# Patient Record
Sex: Female | Born: 1950 | Race: White | Hispanic: No
Health system: Southern US, Community
[De-identification: ages and names within clinical notes are randomized; demographics above are authoritative.]

## PROBLEM LIST (undated history)

## (undated) DIAGNOSIS — IMO0001 Reserved for inherently not codable concepts without codable children: Secondary | ICD-10-CM

## (undated) DIAGNOSIS — R51 Headache: Secondary | ICD-10-CM

## (undated) DIAGNOSIS — K219 Gastro-esophageal reflux disease without esophagitis: Secondary | ICD-10-CM

## (undated) DIAGNOSIS — E119 Type 2 diabetes mellitus without complications: Secondary | ICD-10-CM

## (undated) DIAGNOSIS — I739 Peripheral vascular disease, unspecified: Secondary | ICD-10-CM

## (undated) DIAGNOSIS — Z9889 Other specified postprocedural states: Secondary | ICD-10-CM

## (undated) DIAGNOSIS — G4730 Sleep apnea, unspecified: Secondary | ICD-10-CM

## (undated) DIAGNOSIS — N189 Chronic kidney disease, unspecified: Secondary | ICD-10-CM

## (undated) DIAGNOSIS — M1990 Unspecified osteoarthritis, unspecified site: Secondary | ICD-10-CM

## (undated) DIAGNOSIS — D649 Anemia, unspecified: Secondary | ICD-10-CM

## (undated) DIAGNOSIS — F419 Anxiety disorder, unspecified: Secondary | ICD-10-CM

## (undated) DIAGNOSIS — I872 Venous insufficiency (chronic) (peripheral): Secondary | ICD-10-CM

## (undated) DIAGNOSIS — H269 Unspecified cataract: Secondary | ICD-10-CM

## (undated) DIAGNOSIS — G2581 Restless legs syndrome: Secondary | ICD-10-CM

## (undated) DIAGNOSIS — R112 Nausea with vomiting, unspecified: Secondary | ICD-10-CM

## (undated) DIAGNOSIS — I2699 Other pulmonary embolism without acute cor pulmonale: Secondary | ICD-10-CM

## (undated) DIAGNOSIS — K760 Fatty (change of) liver, not elsewhere classified: Secondary | ICD-10-CM

## (undated) DIAGNOSIS — Z8249 Family history of ischemic heart disease and other diseases of the circulatory system: Secondary | ICD-10-CM

## (undated) DIAGNOSIS — R011 Cardiac murmur, unspecified: Secondary | ICD-10-CM

## (undated) DIAGNOSIS — R519 Headache, unspecified: Secondary | ICD-10-CM

## (undated) HISTORY — PX: ABDOMINAL HYSTERECTOMY: SHX81

## (undated) HISTORY — PX: COLONOSCOPY: SHX174

## (undated) HISTORY — DX: Family history of ischemic heart disease and other diseases of the circulatory system: Z82.49

## (undated) HISTORY — DX: Type 2 diabetes mellitus without complications: E11.9

## (undated) HISTORY — PX: APPENDECTOMY: SHX54

## (undated) HISTORY — PX: CHOLECYSTECTOMY: SHX55

## (undated) HISTORY — PX: PANNICULECTOMY: SUR1001

## (undated) HISTORY — DX: Venous insufficiency (chronic) (peripheral): I87.2

## (undated) HISTORY — DX: Morbid (severe) obesity due to excess calories: E66.01

## (undated) HISTORY — PX: FRACTURE SURGERY: SHX138

## (undated) HISTORY — PX: HERNIA REPAIR: SHX51

## (undated) SURGERY — ARTHROSCOPY, SHOULDER
Anesthesia: General | Laterality: Right

---

## 1999-10-13 ENCOUNTER — Ambulatory Visit (HOSPITAL_BASED_OUTPATIENT_CLINIC_OR_DEPARTMENT_OTHER): Admission: RE | Admit: 1999-10-13 | Discharge: 1999-10-13 | Payer: Self-pay | Admitting: Orthopaedic Surgery

## 1999-12-28 DIAGNOSIS — I2699 Other pulmonary embolism without acute cor pulmonale: Secondary | ICD-10-CM

## 1999-12-28 HISTORY — PX: JOINT REPLACEMENT: SHX530

## 1999-12-28 HISTORY — DX: Other pulmonary embolism without acute cor pulmonale: I26.99

## 2000-08-11 ENCOUNTER — Ambulatory Visit (HOSPITAL_BASED_OUTPATIENT_CLINIC_OR_DEPARTMENT_OTHER): Admission: RE | Admit: 2000-08-11 | Discharge: 2000-08-11 | Payer: Self-pay | Admitting: Orthopaedic Surgery

## 2000-12-19 ENCOUNTER — Emergency Department (HOSPITAL_COMMUNITY): Admission: EM | Admit: 2000-12-19 | Discharge: 2000-12-20 | Payer: Self-pay | Admitting: Emergency Medicine

## 2001-09-01 ENCOUNTER — Encounter: Payer: Self-pay | Admitting: Orthopaedic Surgery

## 2001-09-07 ENCOUNTER — Encounter (INDEPENDENT_AMBULATORY_CARE_PROVIDER_SITE_OTHER): Payer: Self-pay | Admitting: *Deleted

## 2001-09-07 ENCOUNTER — Inpatient Hospital Stay (HOSPITAL_COMMUNITY): Admission: RE | Admit: 2001-09-07 | Discharge: 2001-09-19 | Payer: Self-pay | Admitting: Orthopaedic Surgery

## 2001-09-09 ENCOUNTER — Encounter: Payer: Self-pay | Admitting: Orthopedic Surgery

## 2001-09-10 ENCOUNTER — Encounter: Payer: Self-pay | Admitting: Orthopaedic Surgery

## 2001-09-11 ENCOUNTER — Encounter: Payer: Self-pay | Admitting: Orthopaedic Surgery

## 2001-09-12 ENCOUNTER — Encounter: Payer: Self-pay | Admitting: Orthopaedic Surgery

## 2001-09-14 ENCOUNTER — Encounter: Payer: Self-pay | Admitting: Orthopaedic Surgery

## 2001-09-19 ENCOUNTER — Inpatient Hospital Stay
Admission: RE | Admit: 2001-09-19 | Discharge: 2001-09-28 | Payer: Self-pay | Admitting: Physical Medicine & Rehabilitation

## 2001-09-25 ENCOUNTER — Encounter: Payer: Self-pay | Admitting: Physical Medicine & Rehabilitation

## 2006-01-26 ENCOUNTER — Encounter: Admission: RE | Admit: 2006-01-26 | Discharge: 2006-01-26 | Payer: Self-pay | Admitting: Orthopaedic Surgery

## 2007-11-14 ENCOUNTER — Ambulatory Visit (HOSPITAL_COMMUNITY): Admission: RE | Admit: 2007-11-14 | Discharge: 2007-11-14 | Payer: Self-pay | Admitting: Orthopaedic Surgery

## 2011-05-11 NOTE — Op Note (Signed)
NAME:  [name], [name]            ACCOUNT NO.:  [id number]   MEDICAL RECORD NO.:  [id number]          PATIENT TYPE:  AMB   LOCATION:  SDS                          FACILITY:  [facility]   PHYSICIAN:  Lubertha Basque. Dalldorf, M.D.DATE OF BIRTH:  1951-03-30   DATE OF PROCEDURE:  11/14/2007  DATE OF DISCHARGE:                               OPERATIVE REPORT   PREOPERATIVE DIAGNOSIS:  Right wrist de Quervain's tenosynovitis.   POSTOPERATIVE DIAGNOSIS:  Right wrist de Quervain's tenosynovitis.   PROCEDURE:  Right first dorsal compartment release.   ANESTHESIA:  Bier block and MAC.   ATTENDING SURGEON:  Lubertha Basque. Jerl Santos, M.D.   ASSISTANT:  Lindwood Qua, P.A.-C.   INDICATIONS FOR PROCEDURE:  The patient is a 60 year old woman a long  history right wrist pain.  This has persisted despite bracing, oral anti-  inflammatories, and several injections which did afford her transient  relief.  She is status post successful release on the opposite side for  an identical condition which is de Quervain's tenosynovitis.  She is  offered operative release at this point.  Informed operative consent was  obtained after a discussion of the possible occasions of reaction to  anesthesia, infection, and neurovascular injury.   SUMMARY OF FINDINGS AND PROCEDURE:  Under Bier block and MAC, through a  small radial incision, a first dorsal compartment release was performed.  She had very inflamed tendons and a tight compartment.   DESCRIPTION OF PROCEDURE:  The patient was taken to the operating suite  where Bier block was applied to the level of the forearm.  She was also  some sedation, positioned supine, and prepped and draped in a normal  sterile fashion.  After the administration of IV Kefzol, a small radial  incision was made with dissection down to the first dorsal compartment.  This compartment was released sharply.  She had a single slip of both  the ETP and APL tendons.  The tendons were very inflamed  and swollen.  The wound was irrigated followed by reapproximation of subcutaneous  tissues with 2-0 undyed Vicryl and the skin with nylon.  We did retract  the nerve out of harm's way during the entire dissection.  Adaptic was  applied followed by dry gauze and a loose Ace wrap.  Estimated blood  loss and intraoperative fluids can be obtained from anesthesia records  as can the accurate tourniquet time.  The tourniquet was deflated after  closure and the fingers all became pink and warm immediately.  We then  prepped an area on her left knee and injected this degenerative knee  with epinephrine and lidocaine.   DISPOSITION:  The patient was taken to the recovery room in stable  addition.  She was to go home same day and follow up in the office next  week.  I will contact her by phone tonight.      Lubertha Basque Jerl Santos, M.D.  Electronically Signed     PGD/MEDQ  D:  11/14/2007  T:  11/14/2007  Job:  [id number]

## 2011-05-14 NOTE — Consult Note (Signed)
[place]. [facility]  Patient:    Lindi, Abram [facility] Visit Number: [id number] MRN: [id number]          Service Type: SUR Location: 5000 5030 01 Attending Physician:  Marcene Corning Dictated by:   Barbaraann Share, M.D. [facility] Proc. Date: 09/09/01 Admit Date:  09/07/2001   CC:         Lubertha Basque. Jerl Santos, M.D.   Consultation Report  HISTORY OF PRESENT ILLNESS:  The patient is a 60 year old morbidly obese white female who I have been asked to see for an acute pulmonary embolus.  The patient is status post right total knee replacement September 07, 2001 and yesterday began to have hemoptysis.  This morning, she was noted to have falling O2 saturations and worsening dyspnea and underwent a spiral CT scan of the chest with multiple right lung pulmonary emboli being noted.  There was also a suggestion of questionable edema in the perihilar area versus questionable pneumonia.  The patient currently is comfortable in the chair on nasal O2.  Her saturations will vary anywhere from the low 80s to the mid 90s based on her depth of inspiration.  She is coughing up blood but denies a feeling of congestion, like she has a chest cold.  There is no significant increased work of breathing or pleuritic chest pain.  PAST MEDICAL HISTORY: 1. Diabetes mellitus, for which she is on Glyburide. 2. Status post cholecystectomy and appendectomy. 3. Status post hysterectomy. 4. 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 5. History of chronic lower extremity edema.  ALLERGIES:  PENICILLIN.  SOCIAL HISTORY:  She does not smoke.  The rest of her social history is noncontributory.  FAMILY HISTORY:  Noncontributory.  REVIEW OF SYSTEMS:  As per history of present illness.  PHYSICAL EXAMINATION:  GENERAL:  She is a morbidly obese white female in no acute distress.  VITAL SIGNS:  Blood pressure is 100/36??.  Pulse is 95-110, respiratory rate 20.  She is  afebrile.  HEENT:  Pupils are equal, round and reactive to light to accommodation. Extraocular movements are intact.  Nares are patent without discharge. Oropharynx is clear.  NECK:  Supple without JVD or lymphadenopathy.  It is very hard to examine, however, because of its large size.  There is no palpable thyromegaly.  CHEST:  Decreased breath sounds in the bases with a few crackles.  CARDIAC:  Regular rate and rhythm.  ABDOMEN:  Soft and nontender with good bowel sounds.  GENITAL/RECTAL/BREAST:  Exams not done, not indicated.  EXTREMITIES:  Lower extremities reveal 2+ edema on the right and 1+ on the left.  Pulses are intact distally.  Calves are nontender on the left.  The right is unable to be examined secondary to a splint.  NEUROLOGIC:  She is alert and oriented x 3.  No obvious motor deficits.  LABORATORY DATA:  CT scan as per history of present illness.  IMPRESSION: 1. Acute pulmonary embolus after total knee replacement.  Her major risk    factors are morbid obesity and immobility.  It would not surprise me if she    had DVT even prior to the surgery. 2. 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  I suspect this is    responsible for her fluctuating O2 saturations. 3. Edema versus questionable pneumonic infiltrate.  We will just follow for    now.  PLAN: 1. We will give heparin and Coumadin in the usual overlapping fashion. 2. Check lower extremity Dopplers.  3. Out of bed b.i.d. and incentive spirometry. 4. Diurese versus a trial of antibiotics.  We will let temperature be the    guide.  For now, we will go ahead and give her a few doses of Lasix and see    if this helps things. Dictated by:   Barbaraann Share, M.D. [facility] Attending Physician:  Marcene Corning DD:  09/09/01 TD:  09/09/01 Job: [id number]

## 2011-05-14 NOTE — Discharge Summary (Signed)
[place]. [facility]  Patient:    [name], [name] [facility] Visit Number: [id number] MRN: [id number]          Service Type: ECR Location: SACU 4531 01 Attending Physician:  Faith Rogue T Dictated by:   Lucita Lora, M.D. Admit Date:  09/19/2001 Discharge Date: 09/28/2001   CC:         Dr. Record  Barbaraann Share, M.D. [facility]   Discharge Summary  DISCHARGE DIAGNOSES: 1. Status post right total knee replacement secondary to end-stage    degenerative joint disease. 2. Diabetes mellitus. 3. Gastroesophageal reflux disease. 4. Morbid obesity. 5. Anemia. 6. Depression. 7. Hypercholesterolemia. 8. Chronic lower extremity edema. 9. Pulmonary emboli.  HISTORY OF PRESENT ILLNESS:  The patient is a 60 year old white female with past medical history of diabetes, GERD, and depression, admitted on September 07, 2001, for right total knee replacement by Lubertha Basque. Dalldorf, M.D. secondary to end-stage DJD disease.  The patient is presently on Coumadin for DVT prophylaxis and for PE.  Postoperative complications include shortness of breath, cough, respiratory distress, pulmonary emboli, pneumonia, and cellulitis.  The patient is presently on Keflex.  PT report; the patient is ambulating minimal assist 30 feet +2 for safety. Transfers sit to stand minimal assist.  Her primary care physician is Dr. Record.  Pulmonologist is Barbaraann Share, M.D. [facility] while in the hospital.  PAST MEDICAL HISTORY:  Significant for DM, GERD, depression, hepatitis, insomnia, bronchitis, morbid obesity, diverticulitis, lower extremity edema.  PAST SURGICAL HISTORY:  Significant for hysterectomy, gallbladder removal.  ALLERGIES:  PENICILLIN.  SOCIAL HISTORY:  The patient lives alone in a mobile home.  She has four steps to entry.  She is independent prior to admission and was working but presently is unemployed.  No tobacco and alcohol use.  She has one daughter.  REVIEW OF SYSTEMS:   Significant for knee pain, obesity, and hernia.  MEDICATIONS: 1. Glyburide 5 mg p.o. b.i.d. 2. Acifex 200 mg q.d. 3. Celexa 20 mg q.d. 4. Lasix 40 mg q.d. 5. Lipitor 20 mg q.d. 6. Potassium chloride 20 mEq q.d. 7. Xanax.  HOSPITAL COURSE:  [name] was admitted to [facility] where she received physical therapy, occupational therapy as tolerated.  [name] progressed fairly well while in [facility].  Her hospital course is significant for severe anemia, and hypertension, and urinary tract infection.  On September 26, 2001, the patient had received two units of packed red blood cells due to a low hemoglobin.  The patient tolerated the transfusion very well.  She was continued on Trinsicon p.o. b.i.d.  Hemoglobin did elevate to 10.1.  The patient was started on Cipro 250 mg p.o. b.i.d. for urinary tract infection for a total of seven days.  She remained on Coumadin throughout her stay in [facility] for pulmonary embolus.  She will be on Coumadin for approximately six months.  At the time of admission the patient was started on Keflex due to possible cellulitis.  Keflex was discontinued on postoperative day #15.  On September 22, 2001, the patient had demonstrated that her blood pressure had dropped to 96/46, but was possibly due to wrong size cuff.  Blood pressure remained stable mostly throughout her stay in [facility].  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  Her CBGs remained in fair control. She was on Glyburide 5 mg p.o. q.d. Besides occasional insomnia, the patient had no other major medical complications while in [facility] for therapies.  Latest labs indicated that her hemoglobin  was 10.2, hematocrit 31.3, white blood cell count 7.5, platelets 340.  Her sodium was 139, potassium 4.0, glucose 104, BUN 7, creatinine 0.7, AST 21, ALT 13.  Her INR prior to discharge was 2.7.  At the time of discharge, blood pressure was 150/63, temperature was 97.4, pulse 74, respiratory rate 20.  CBGs were  running from 79 and 97 to 113 and 110.  Surgical incisions demonstrate no signs of infection.  Sutures were removed.  Right lower extremity demonstrated about 2+ edema.  The rest of the physical examination was unremarkable.  She was discharged home with her family.  Her latest PT report indicates that the patient was ambulating modified independently 150 feet with rolling walker, transfer sit to stand modified independently with rolling walker.  She could perform all ADLs modified independently.  DISCHARGE MEDICATIONS:  1. Glyburide 5 mg twice daily.  2. Lipitor 20 mg daily.  3. Celexa 20 mg daily.  4. Lasix 40 mg twice daily.  5. Potassium 20 mEq twice daily.  6. Trinsicon one tablet twice daily.  7. Oxycontin 10 mg one tablet every 12 hours.  8. Oxycodone 5 mg one to two tablets every four to six hours as needed for     pain.  9. Coumadin 2 mg daily for six months. 10. Acifex 20 mg daily.  ACTIVITY:  The patient is to use walker and observe knee precautions.  DIET:  Low cholesterol, low fat, low sodium, no concentrated sweets.  She will have [facility] for nursing, PT, and OT.  They will monitor her Coumadin, the first draw will be October 02, 2001, and thereafter Dr. Record will follow.  She is to follow up with Lubertha Basque. Dalldorf, M.D. in two weeks.  Follow up with Dr. Shelle Iron, pulmonologist within two to four weeks. Follow up with Dr. Record, her primary care Adithi Gammon in one to two weeks. Follow up with Faith Rogue, M.D. as needed. Dictated by:   Lucita Lora, M.D. Attending Physician:  Faith Rogue T DD:  09/28/01 TD:  09/29/01 Job: [id number]

## 2011-05-14 NOTE — Discharge Summary (Signed)
[place]. [facility]  Patient:    Brandi, Bridges [facility] Visit Number: [id number] MRN: [id number]          Service Type: ECR Location: SACU 4531 01 Attending Physician:  Faith Rogue T Dictated by:   Prince Rome, P.A. Admit Date:  09/19/2001 Discharge Date: 09/28/2001                             Discharge Summary  ADMITTING DIAGNOSES: 1. End-stage right knee degenerative joint disease. 2. Morbid obesity. 3. Non-insulin dependent diabetes mellitus. 4. Status post hysterectomy. 5. Status post cholecystectomy.  DISCHARGE DIAGNOSES: 1. End-stage right knee degenerative joint disease. 2. Morbid obesity. 3. Non-insulin dependent diabetes mellitus. 4. Status post hysterectomy. 5. Status post cholecystectomy. 6. Pulmonary embolism.  BRIEF HISTORY:  This is a 60 year old white female patient well known to our practice, who we have been treating for right knee pain for some time.  She has had two previous knee arthroscopies and has had significant degenerative joint disease, particularly of her medial compartment.  I discussed treatment options with the patient and the concerns with her weight being a factor doing a total knee replacement, which is what was decided upon.  PERTINENT LABORATORY AND X-RAY FINDINGS:  Inclusive in this chart.  She had an echocardiogram done which showed left ventricular systolic function normal and venous Dopplers with no evidence of DVT.  EKG revealed normal sinus rhythm with sinus arrhythmias.  Lungs revealed no active disease on chest x-ray.  She had multiple chest x-rays done and a CT scan as well which showed a pulmonary emboli involving the right distal main pulmonary artery.  She has pro times drawn serially as she was on low-dose Coumadin protocol for anticoagulation purposes.  Last testing, hemoglobin was 8.6.  COURSE IN HOSPITAL:  The patient was admitted postoperatively with p.o. and IV analgesics for  pain.  She was also anticoagulated and dressings were changed numerous times.  Drains pulled.  Physical therapy was ordered for weight-bearing as tolerated.  She had some difficulties while in the hospital because of decreased O2 saturations and she had a suspected pulmonary embolus.  She was taken to the ICU and followed there by Dr. Shelle Iron.  Dr. Shelle Iron felt that she needed to be anticoagulated on a long-term basis.  He followed her throughout her hospital stay and she improved and was eventually discharged.  CONDITION ON DISCHARGE:  Improved.  FOLLOWUP:  I will see her back in our office in two weeks after discharge from the hospital.  Staples will be removed in two weeks postoperatively.  DISCHARGE MEDICATIONS:  She will remain on Coumadin and pain medications. Dictated by:   Prince Rome, P.A. Attending Physician:  Faith Rogue T DD:  10/28/01 TD:  10/30/01 Job: [id number]

## 2011-05-14 NOTE — Op Note (Signed)
[place]. [facility]  Patient:    [name], [place] [facility]                       MRN: [id number] Proc. Date: 08/11/00 Adm. Date:  [id number] Attending:  Marcene Corning                           Operative Report  PREOPERATIVE DIAGNOSES: 1. Right knee torn medial meniscus. 2. Right knee degenerative joint disease.  POSTOPERATIVE DIAGNOSES: 1. Right knee torn medial and torn lateral meniscus. 2. Right knee degenerative joint disease.  PROCEDURES: 1. Right knee partial medial and partial lateral meniscectomy. 2. Right knee chondroplasty patellofemoral joint.  ANESTHESIA:  General.  SURGEON:  Lubertha Basque. Jerl Santos, M.D.  ASSISTANT:  Prince Rome, P.A.  INDICATIONS:  The patient is a 60 year old woman with a recent history of severe right knee pain.  She is status post an arthroscopy several years ago in [place].  Her pain has persisted at this point despite anti-inflammatories and an injection, which afforded her only transient relief.  At this point, she is offered operative intervention to consist of an arthroscopy.  The procedure was discussed with the patient and informed operative consent was obtained after discussion of possible complications of reaction to anesthesia and infection.  DESCRIPTION OF PROCEDURE:  The patient was taken to the operating suite, where a general anesthetic was applied without difficulty.  She was positioned supine and prepped and draped in normal sterile fashion.  After the administration of preoperative IV antibiotics, an arthroscopy of the right knee was performed through two inferior portals.  Suprapatellar pouch was benign, while the patellofemoral joint exhibited some grade 3 chondromalacia, which was addressed with a chondroplasty.  This joint tracked very well and no lateral release was required.  The medial gutter was notable for a flap of articular cartilage, which was removed.  The medial femoral condyle had a  dime size defect on the posterior aspect, which was addressed with a chondroplasty. There was a small area of grade 4 change at the base of the section.  She had a degenerative tear of the posterior horn of her medial meniscus, which was addressed with removal of about 5% of the structure back to a stable rim.  In the lateral compartment, she had some anterior and middle horn tearing of the meniscus, which was addressed with a partial lateral meniscectomy taking a very small portion of the structure back to a stable rim.  There were no significant degenerative changes in that compartment.  The ACL and PCL were intake.  The knee was thoroughly irrigated at the end of the case, followed by the placement of Marcaine with epinephrine and morphine plus Depo Medrol. Adaptic was placed over the two portals, followed by dry gauze and a loose Ace wrap.  Estimated blood loss and intraoperative fluids can be obtained from anesthesia records.  DISPOSITION:  The patient was extubated in the operating room and taken to recovery room in stable condition.  PLANS:  For her to go home the same day and to follow up in the office in less than a week.  I will contact her by phone tonight. DD:  08/11/00 TD:  08/11/00 Job: [id number]

## 2011-05-14 NOTE — Op Note (Signed)
[place]. [facility]  Patient:    [name], [name] Visit Number: [id number] MRN: [id number]          Service Type: SUR Location: RCRM 2550 05 Attending Physician:  Marcene Corning Dictated by:   Lubertha Basque. Jerl Santos, M.D. Proc. Date: 09/07/01 Admit Date:  09/07/2001                             Operative Report  PREOPERATIVE DIAGNOSIS:  Right knee degenerative arthritis.  POSTOPERATIVE DIAGNOSIS:  Right knee degenerative arthritis.  OPERATION:  Right total knee replacement.  SURGEON:  Lubertha Basque. Jerl Santos, M.D.  ASSISTANT:  Jearld Adjutant, M.D./Michael R. Harolyn Rutherford, P.A.  ANESTHESIA:  General  INDICATION FOR PROCEDURE:  The patient is a 60 year old woman with long history of right knee pain.  This persisted despite various oral anti-inflammatories, injections, and a knee arthroscopy at which point end-stage degeneration was seen in the medial compartment mostly.  X-rays are consistent with this diagnosis and major degeneration of the medial compartment.  She is morbidly obese and we tried to avoid a knee replacement operation but she is left with disabling pain at rest and with activity and at this point have offered the knee replacement operation.  The procedure was discussed with the patient and informed operative consent was obtained after discussion of the possible complications of reaction to anesthesia, infection, DVT, PE, and death.  DESCRIPTION OF PROCEDURE:  The patient was taken to the operating suite, where a general anesthetic was applied without difficulty.  She was positioned supine and prepped and draped in the normal sterile fashion.  After the administration of preoperative IV antibiotics, the right leg was elevated, exsanguinated, and a tourniquet inflated about the thigh.  An anterior longitudinal incision was made with dissection down through a abundance of adipose tissue to expose the extensor mechanism.  A medial  parapatellar incision was made through this structure.  We could not flip the knee cap because of her large leg size but slid it over to the side and flexed her knee up.  Her baseline range of motion was to 80 or 90 degrees of flexion which made the case extremely difficult.  An extra medullary guide was placed on the tibia and a cut made parallel with the floor.  An intramedullary femoral guide was placed to make an anterior and posterior cut and a flexion gap of 10 mm. She was found to have a small defect on the anterolateral aspect of her distal femur.  Some of this tissue was sent to pathology and it appeared to be hypercellular but there was no concern for neoplasia.  The second intramedullary guide was then placed in the femur to create a distal cut, making an equal extension gap of 10 mm.  The femur sized to a standard component and the appropriate chamfer guide was placed and used.  Patella also required trimming down but because of the large size of her thigh, we could not place the typical cutting device and this had to be done free hand.  We took her patella thickness from 22 down to 13 mm.  The appropriate standard guide was placed to make the three drill holes in the patella.  The tibia sized to a #3 component and the appropriate guide was placed and used creating a keel.  A trial reduction was performed with some difficultly.  The patella tracked in a lateral position and a lateral release was  required.  Otherwise the knee came to full extension easily and flexed to 80 or 90 degrees.  The trial components were removed and pulsatile lavage was used to irrigate all cut bony surfaces.  Cement was mixed including the antibiotic and this was pressurized on the tibia and femur.  The aforementioned DePuy components were placed which were standard plus femur and #3 tibia LCS system.  The 10 mm spacer was applied.  Excess cement was trimmed and pressure was held on the components until  the cement had hardened completely.  Some excess cement was placed into the defect on the anterolateral femur as well. A separate batch of cement was mixed for the patella and this did not include an antibiotic.  The standard patella was then cemented into place with excess cement trimmed.  The pressure held on the component until cement had hardened.  The tourniquet was deflated and a small bleeding was easily controlled with Bovie cautery. Pulsatile lavage was used to cleanse the knee followed by placement of a drain exiting superolaterally.  Then #1 Vicryl was used to reapproximate the extensor mechanism.  Once this was accomplished, the knee flexed to about 80 degrees passively.  Subcutaneous tissue was reapproximated in several layers of Vicryl followed by staples in the skin.  Adaptic was applied to the wound, followed by dry gauze and a loose Ace wrap.  Estimated blood and intraoperative fluids as well as accurate tourniquet time can be obtained from anesthesia records.  DISPOSITION:  The patient was extubated in the operating room and taken to the recovery room in stable condition.  An attempt at an epidural was made for postoperative pain control.  She will be admitted to the orthopedic surgery service.  Appropriate postoperative care to include perioperative antibiotics and Coumadin for DVT prophylaxis. Dictated by:   Lubertha Basque Jerl Santos, M.D. Attending Physician:  Marcene Corning DD:  09/07/01 TD:  09/07/01 Job: [id number]

## 2011-10-05 LAB — CBC
HCT: 34.7 — ABNORMAL LOW
Hemoglobin: 11.4 — ABNORMAL LOW
MCHC: 33
MCV: 82.8
Platelets: 184
RBC: 4.19
RDW: 17 — ABNORMAL HIGH
WBC: 9.3

## 2011-10-05 LAB — COMPREHENSIVE METABOLIC PANEL
ALT: 21
AST: 38 — ABNORMAL HIGH
Albumin: 3.2 — ABNORMAL LOW
Alkaline Phosphatase: 105
BUN: 10
CO2: 25
Calcium: 8.9
Chloride: 103
Creatinine, Ser: 0.86
GFR calc Af Amer: >60
GFR calc non Af Amer: >60
Glucose, Bld: 124 — ABNORMAL HIGH
Potassium: 4.1
Sodium: 137
Total Bilirubin: 0.9
Total Protein: 8.1

## 2012-12-27 HISTORY — PX: CARDIAC CATHETERIZATION: SHX172

## 2014-03-20 ENCOUNTER — Encounter: Payer: Self-pay | Admitting: Cardiovascular Disease

## 2014-03-20 ENCOUNTER — Ambulatory Visit (HOSPITAL_COMMUNITY)
Admission: RE | Admit: 2014-03-20 | Discharge: 2014-03-20 | Disposition: A | Discharge status: Home / Self Care | Payer: Medicare Other | Source: Ambulatory Visit | Attending: Cardiovascular Disease | Admitting: Cardiovascular Disease

## 2014-03-20 ENCOUNTER — Ambulatory Visit (INDEPENDENT_AMBULATORY_CARE_PROVIDER_SITE_OTHER): Payer: Medicare Other | Admitting: Cardiovascular Disease

## 2014-03-20 VITALS — BP 121/61 | HR 83 | Ht 60.0 in | Wt 322.0 lb

## 2014-03-20 DIAGNOSIS — I739 Peripheral vascular disease, unspecified: Secondary | ICD-10-CM | POA: Insufficient documentation

## 2014-03-20 DIAGNOSIS — Z01818 Encounter for other preprocedural examination: Secondary | ICD-10-CM | POA: Insufficient documentation

## 2014-03-20 DIAGNOSIS — E1165 Type 2 diabetes mellitus with hyperglycemia: Secondary | ICD-10-CM | POA: Insufficient documentation

## 2014-03-20 DIAGNOSIS — E119 Type 2 diabetes mellitus without complications: Secondary | ICD-10-CM

## 2014-03-20 DIAGNOSIS — M21619 Bunion of unspecified foot: Secondary | ICD-10-CM

## 2014-03-20 DIAGNOSIS — Z8249 Family history of ischemic heart disease and other diseases of the circulatory system: Secondary | ICD-10-CM

## 2014-03-20 DIAGNOSIS — Z86711 Personal history of pulmonary embolism: Secondary | ICD-10-CM

## 2014-03-20 DIAGNOSIS — M21611 Bunion of right foot: Secondary | ICD-10-CM | POA: Insufficient documentation

## 2014-03-20 NOTE — Patient Instructions (Addendum)
Follow up with Dr Berry as needed.  

## 2014-03-20 NOTE — Progress Notes (Signed)
Right Lower Extremity Arterial Duplex Completed. No evidence for arterial insufficiency. [place]

## 2014-03-20 NOTE — Progress Notes (Signed)
03/20/2014 [name]   [birth year]  [id number]  Primary Physician RECORD,CHARLES Primary Cardiologist: Lorretta Harp MD Renae Gloss   HPI:  [name] is a 63 year old morbidly overweight widowed Caucasian female mother of one child, grandmother and 2 grandchildren referred by Dr. Harlow Mares, podiatrist, at [facility] for preoperative clearance and vascular evaluation prior to surgical excision of a right foot bunion. Her vascular risk factors include diabetes and family history. She has never had a heart attack or stroke. She did have a cardiac catheter did have position performed by Dr. Sharolyn Douglas 11/27/13 at [facility] performed radially which apparently showed no evidence of CAD. This was done because of chest pain which ultimately was determined to be gastrointestinal in nature. She's had a history of a remote pulmonary embolus which led to her disability. She denies claudication but does complain of right foot pain.   Current Outpatient Prescriptions  Medication Sig Dispense Refill  . ALPRAZolam (XANAX) 1 MG tablet Take 1 mg by mouth at bedtime as needed for anxiety.      [name] Kitchen aspirin EC 81 MG tablet Take 81 mg by mouth daily.      . carbidopa-levodopa (SINEMET IR) 10-100 MG per tablet Take 1 tablet by mouth 3 (three) times daily.      . citalopram (CELEXA) 40 MG tablet Take 40 mg by mouth daily.      . fluticasone (FLONASE) 50 MCG/ACT nasal spray Place into both nostrils daily.      . furosemide (LASIX) 40 MG tablet Take 40 mg by mouth 2 (two) times daily.      [name] Kitchen glipiZIDE (GLUCOTROL) 5 MG tablet Take 5 mg by mouth daily before breakfast.      . levofloxacin (LEVAQUIN) 250 MG tablet Take 250 mg by mouth daily.      . metFORMIN (GLUCOPHAGE) 1000 MG tablet Take 1,000 mg by mouth 2 (two) times daily with a meal.      . montelukast (SINGULAIR) 10 MG tablet Take 10 mg by mouth at bedtime.      [name] Kitchen omeprazole (PRILOSEC) 40 MG capsule Take 40 mg  by mouth daily.      . potassium chloride SA (K-DUR,KLOR-CON) 20 MEQ tablet Take 20 mEq by mouth 2 (two) times daily.      . pravastatin (PRAVACHOL) 80 MG tablet Take 80 mg by mouth daily.      [name] Kitchen spironolactone (ALDACTONE) 25 MG tablet Take 25 mg by mouth daily.      [name] Kitchen tiotropium (SPIRIVA) 18 MCG inhalation capsule Place 18 mcg into inhaler and inhale daily.      . traMADol (ULTRAM) 50 MG tablet Take by mouth every 6 (six) hours as needed.       No current facility-administered medications for this visit.    Allergies  Allergen Reactions  . Penicillins Itching and Rash    History   Social History  . Marital Status: Widowed    Spouse Name: N/A    Number of Children: N/A  . Years of Education: N/A   Occupational History  . Not on file.   Social History Main Topics  . Smoking status: Never Smoker   . Smokeless tobacco: Never Used  . Alcohol Use: No  . Drug Use: No  . Sexual Activity: Not on file   Other Topics Concern  . Not on file   Social History Narrative  . No narrative on file     Review of Systems: General:  negative for chills, fever, night sweats or weight changes.  Cardiovascular: negative for chest pain, dyspnea on exertion, edema, orthopnea, palpitations, paroxysmal nocturnal dyspnea or shortness of breath Dermatological: negative for rash Respiratory: negative for cough or wheezing Urologic: negative for hematuria Abdominal: negative for nausea, vomiting, diarrhea, bright red blood per rectum, melena, or hematemesis Neurologic: negative for visual changes, syncope, or dizziness All other systems reviewed and are otherwise negative except as noted above.    Blood pressure 121/61, pulse 83, height 5' (1.524 m), weight 322 lb (146.058 kg).  General appearance: alert and no distress Neck: no adenopathy, no carotid bruit, no JVD, supple, symmetrical, trachea midline and thyroid not enlarged, symmetric, no tenderness/mass/nodules Lungs: clear to auscultation  bilaterally Heart: regular rate and rhythm, S1, S2 normal, no murmur, click, rub or gallop Extremities: extremities normal, atraumatic, no cyanosis or edema  EKG not performed today  ASSESSMENT AND PLAN:   Bunion, right foot The patient was referred to me by Dr. Barkley Bruns from [facility] for vascular evaluation prior to surgical excision of a bunion. She has a history of diabetes. On exam she has palpable pedal pulses. I am going to get lower extremity arterial Doppler studies.      Lorretta Harp MD FACP,FACC,FAHA, [facility] 03/20/2014 3:00 PM

## 2014-03-20 NOTE — Assessment & Plan Note (Signed)
The patient was referred to me by Dr. Barkley Bruns from [facility] for vascular evaluation prior to surgical excision of a bunion. She has a history of diabetes. On exam she has palpable pedal pulses. I am going to get lower extremity arterial Doppler studies.

## 2014-12-30 DIAGNOSIS — M2011 Hallux valgus (acquired), right foot: Secondary | ICD-10-CM | POA: Diagnosis not present

## 2014-12-30 DIAGNOSIS — Z96659 Presence of unspecified artificial knee joint: Secondary | ICD-10-CM | POA: Diagnosis not present

## 2015-01-01 DIAGNOSIS — J449 Chronic obstructive pulmonary disease, unspecified: Secondary | ICD-10-CM | POA: Diagnosis not present

## 2015-01-01 DIAGNOSIS — E119 Type 2 diabetes mellitus without complications: Secondary | ICD-10-CM | POA: Diagnosis not present

## 2015-01-01 DIAGNOSIS — K760 Fatty (change of) liver, not elsewhere classified: Secondary | ICD-10-CM | POA: Diagnosis not present

## 2015-01-01 DIAGNOSIS — N183 Chronic kidney disease, stage 3 (moderate): Secondary | ICD-10-CM | POA: Diagnosis not present

## 2015-01-01 DIAGNOSIS — I129 Hypertensive chronic kidney disease with stage 1 through stage 4 chronic kidney disease, or unspecified chronic kidney disease: Secondary | ICD-10-CM | POA: Diagnosis not present

## 2015-01-01 DIAGNOSIS — G4730 Sleep apnea, unspecified: Secondary | ICD-10-CM | POA: Diagnosis not present

## 2015-01-01 DIAGNOSIS — Z800 Family history of malignant neoplasm of digestive organs: Secondary | ICD-10-CM | POA: Diagnosis not present

## 2015-01-01 DIAGNOSIS — F329 Major depressive disorder, single episode, unspecified: Secondary | ICD-10-CM | POA: Diagnosis not present

## 2015-01-01 DIAGNOSIS — Z880 Allergy status to penicillin: Secondary | ICD-10-CM | POA: Diagnosis not present

## 2015-01-01 DIAGNOSIS — Z79899 Other long term (current) drug therapy: Secondary | ICD-10-CM | POA: Diagnosis not present

## 2015-01-01 DIAGNOSIS — K5730 Diverticulosis of large intestine without perforation or abscess without bleeding: Secondary | ICD-10-CM | POA: Diagnosis not present

## 2015-01-01 DIAGNOSIS — J45909 Unspecified asthma, uncomplicated: Secondary | ICD-10-CM | POA: Diagnosis not present

## 2015-01-01 DIAGNOSIS — F419 Anxiety disorder, unspecified: Secondary | ICD-10-CM | POA: Diagnosis not present

## 2015-01-01 DIAGNOSIS — K219 Gastro-esophageal reflux disease without esophagitis: Secondary | ICD-10-CM | POA: Diagnosis not present

## 2015-01-01 DIAGNOSIS — Z6841 Body Mass Index (BMI) 40.0 and over, adult: Secondary | ICD-10-CM | POA: Diagnosis not present

## 2015-01-01 DIAGNOSIS — M1990 Unspecified osteoarthritis, unspecified site: Secondary | ICD-10-CM | POA: Diagnosis not present

## 2015-01-01 DIAGNOSIS — Z8371 Family history of colonic polyps: Secondary | ICD-10-CM | POA: Diagnosis not present

## 2015-01-01 DIAGNOSIS — Z888 Allergy status to other drugs, medicaments and biological substances status: Secondary | ICD-10-CM | POA: Diagnosis not present

## 2015-01-01 DIAGNOSIS — Z7951 Long term (current) use of inhaled steroids: Secondary | ICD-10-CM | POA: Diagnosis not present

## 2015-01-01 DIAGNOSIS — E785 Hyperlipidemia, unspecified: Secondary | ICD-10-CM | POA: Diagnosis not present

## 2015-01-06 DIAGNOSIS — Z1231 Encounter for screening mammogram for malignant neoplasm of breast: Secondary | ICD-10-CM | POA: Diagnosis not present

## 2015-01-10 ENCOUNTER — Other Ambulatory Visit: Payer: Self-pay | Admitting: Orthopaedic Surgery

## 2015-01-14 DIAGNOSIS — J329 Chronic sinusitis, unspecified: Secondary | ICD-10-CM | POA: Diagnosis not present

## 2015-01-17 ENCOUNTER — Encounter (HOSPITAL_COMMUNITY): Payer: Self-pay | Admitting: *Deleted

## 2015-01-17 ENCOUNTER — Inpatient Hospital Stay (HOSPITAL_COMMUNITY)
Admission: RE | Admit: 2015-01-17 | Discharge: 2015-01-17 | Disposition: A | Discharge status: Home / Self Care | Payer: No Typology Code available for payment source | Source: Ambulatory Visit

## 2015-01-17 ENCOUNTER — Other Ambulatory Visit (HOSPITAL_COMMUNITY): Payer: Self-pay | Admitting: *Deleted

## 2015-01-17 NOTE — Pre-Procedure Instructions (Signed)
[name]  01/17/2015   Your procedure is scheduled on:  Tuesday, January 28, 2015 at 1:05 PM.   Report to [facility] Entrance "A" Admitting Office at 11:00 AM.   Call this number if you have problems the morning of surgery: [phone]               Any questions prior to day of surgery, please call [phone] between 8 & 4 PM.    Remember:   Do not eat food or drink liquids after midnight Monday, 01/27/15.   Take these medicines the morning of surgery with A SIP OF WATER: Carbidopa-Levodopa (SINEMET IR), Citalopram (CELEXA), Omeprazole (PRILOSEC), Tiotropium (SPIRIVA), Tramadol (ULTRAM) - if needed, Alprazolam Duanne Moron) - if needed.  Stop Aspirin 7 days prior to surgery.  Do not take any diabetic medications the morning of surgery.   Do not wear jewelry, make-up or nail polish.  Do not wear lotions, powders, or perfumes. You may wear deodorant.  Do not shave 48 hours prior to surgery.   Do not bring valuables to the hospital.  [facility] is not responsible                  for any belongings or valuables.               Contacts, dentures or bridgework may not be worn into surgery.  Leave suitcase in the car. After surgery it may be brought to your room.  For patients admitted to the hospital, discharge time is determined by your                treatment team.               Patients discharged the day of surgery will not be allowed to drive home.    Special Instructions: [place] - Preparing for Surgery  Before surgery, you can play an important role.  Because skin is not sterile, your skin needs to be as free of germs as possible.  You can reduce the number of germs on you skin by washing with CHG (chlorahexidine gluconate) soap before surgery.  CHG is an antiseptic cleaner which kills germs and bonds with the skin to continue killing germs even after washing.  Please DO NOT use if you have an allergy to CHG or antibacterial soaps.  If your skin becomes  reddened/irritated stop using the CHG and inform your nurse when you arrive at Short Stay.  Do not shave (including legs and underarms) for at least 48 hours prior to the first CHG shower.  You may shave your face.  Please follow these instructions carefully:   1.  Shower with CHG Soap the night before surgery and the                                morning of Surgery.  2.  If you choose to wash your hair, wash your hair first as usual with your       normal shampoo.  3.  After you shampoo, rinse your hair and body thoroughly to remove the                      Shampoo.  4.  Use CHG as you would any other liquid soap.  You can apply chg directly       to the skin and wash gently with scrungie or a  clean washcloth.  5.  Apply the CHG Soap to your body ONLY FROM THE NECK DOWN.        Do not use on open wounds or open sores.  Avoid contact with your eyes, ears, mouth and genitals (private parts).  Wash genitals (private parts) with your normal soap.  6.  Wash thoroughly, paying special attention to the area where your surgery        will be performed.  7.  Thoroughly rinse your body with warm water from the neck down.  8.  DO NOT shower/wash with your normal soap after using and rinsing off       the CHG Soap.  9.  Pat yourself dry with a clean towel.            10.  Wear clean pajamas.            11.  Place clean sheets on your bed the night of your first shower and do not        sleep with pets.  Day of Surgery  Do not apply any lotions the morning of surgery.  Please wear clean clothes to the hospital.     Please read over the following fact sheets that you were given: Pain Booklet, Coughing and Deep Breathing and Surgical Site Infection Prevention

## 2015-01-17 NOTE — Progress Notes (Addendum)
Pt states she had a cath in 2014 at [facility] and was told it was clean. Pt states she has a sinus infection now and has been put on Doxycycline and Hydrocodone syrup. Denies fever or chills.    Pt was unable to make it in for her PAT appt due to the bad weather, called her and did the pre-op interview over the phone and gave her pre-op instructions. I explained to her that one of the schedulers would be calling her to arrange a time next week for her to come in and get her blood work done.

## 2015-01-20 DIAGNOSIS — Z880 Allergy status to penicillin: Secondary | ICD-10-CM | POA: Diagnosis not present

## 2015-01-20 DIAGNOSIS — I10 Essential (primary) hypertension: Secondary | ICD-10-CM | POA: Diagnosis not present

## 2015-01-20 DIAGNOSIS — E119 Type 2 diabetes mellitus without complications: Secondary | ICD-10-CM | POA: Diagnosis not present

## 2015-01-20 DIAGNOSIS — E785 Hyperlipidemia, unspecified: Secondary | ICD-10-CM | POA: Diagnosis not present

## 2015-01-20 DIAGNOSIS — Z8719 Personal history of other diseases of the digestive system: Secondary | ICD-10-CM | POA: Diagnosis not present

## 2015-01-20 DIAGNOSIS — F419 Anxiety disorder, unspecified: Secondary | ICD-10-CM | POA: Diagnosis not present

## 2015-01-20 DIAGNOSIS — R51 Headache: Secondary | ICD-10-CM | POA: Diagnosis not present

## 2015-01-20 DIAGNOSIS — R0981 Nasal congestion: Secondary | ICD-10-CM | POA: Diagnosis not present

## 2015-01-20 DIAGNOSIS — Z86711 Personal history of pulmonary embolism: Secondary | ICD-10-CM | POA: Diagnosis not present

## 2015-01-20 DIAGNOSIS — J449 Chronic obstructive pulmonary disease, unspecified: Secondary | ICD-10-CM | POA: Diagnosis not present

## 2015-01-20 DIAGNOSIS — G2581 Restless legs syndrome: Secondary | ICD-10-CM | POA: Diagnosis not present

## 2015-01-20 DIAGNOSIS — K219 Gastro-esophageal reflux disease without esophagitis: Secondary | ICD-10-CM | POA: Diagnosis not present

## 2015-01-20 DIAGNOSIS — Z882 Allergy status to sulfonamides status: Secondary | ICD-10-CM | POA: Diagnosis not present

## 2015-01-20 DIAGNOSIS — Z888 Allergy status to other drugs, medicaments and biological substances status: Secondary | ICD-10-CM | POA: Diagnosis not present

## 2015-01-20 DIAGNOSIS — F329 Major depressive disorder, single episode, unspecified: Secondary | ICD-10-CM | POA: Diagnosis not present

## 2015-01-20 DIAGNOSIS — R438 Other disturbances of smell and taste: Secondary | ICD-10-CM | POA: Diagnosis not present

## 2015-01-20 DIAGNOSIS — G4733 Obstructive sleep apnea (adult) (pediatric): Secondary | ICD-10-CM | POA: Diagnosis not present

## 2015-01-20 DIAGNOSIS — M109 Gout, unspecified: Secondary | ICD-10-CM | POA: Diagnosis not present

## 2015-01-20 DIAGNOSIS — Z90710 Acquired absence of both cervix and uterus: Secondary | ICD-10-CM | POA: Diagnosis not present

## 2015-01-20 DIAGNOSIS — J45909 Unspecified asthma, uncomplicated: Secondary | ICD-10-CM | POA: Diagnosis not present

## 2015-01-22 NOTE — H&P (Signed)
[name] is an 64 y.o. female.   Chief Complaint: Right foot HPI: [name] has been through her panniculectomy procedure and has lost substantial weight. She continues to walk with a cane in her right hand due to her degenerative left knee. The Supartz series helped for a while but she again has an ache there. Her right elbow also bothers her terribly. Her greatest problem, however, is her right foot. She has pain which limits her ability to wear comfortable shoes. The pain is mostly at the base of her great toe. We have talked about bunion surgery in the past but always put it off because of the risks related to her weight. She would like to have that taken care of now if possible.   X-ray: X-rays including foot AP lateral and obliques view shows a small legs valgus deformity.  First MTP joint.  Past Medical History  Diagnosis Date  . Diabetes   . Family history of coronary artery disease   . Morbid obesity   . Venous stasis dermatitis of both lower extremities   . Shortness of breath dyspnea   . Restless leg syndrome   . Sleep apnea     can't wear her cpap due to nose being sore  . Peripheral vascular disease   . Pulmonary embolism 2001  . GERD (gastroesophageal reflux disease)   . Arthritis   . Anemia     hx of low iron  . Fatty liver   . Cataracts, bilateral     Past Surgical History  Procedure Laterality Date  . Joint replacement Right 2001    knee  . Colonoscopy    . Appendectomy    . Abdominal hysterectomy    . Cholecystectomy    . Fracture surgery Left     shoulder  . Hernia repair      umbilical hernia  . Cardiac catheterization  2014    Family History  Problem Relation Age of Onset  . Heart attack Mother   . Heart attack Father   . Renal Disease Sister   . Heart failure Sister   . Parkinsonism Brother   . Hypertension Sister   . Thyroid disease Sister   . Crohn's disease Sister    Social History:  reports that she has never smoked. She has never used  smokeless tobacco. She reports that she does not drink alcohol or use illicit drugs.  Allergies:  Allergies  Allergen Reactions  . Amlodipine Itching  . Sulfa Antibiotics Itching  . Penicillins Itching and Rash    No prescriptions prior to admission    No results found for this or any previous visit (from the past 48 hour(s)). No results found.  Review of Systems  Musculoskeletal: Positive for joint pain.       Right foot.  All other systems reviewed and are negative.   There were no vitals taken for this visit. Physical Exam  Constitutional: She is oriented to person, place, and time. She appears well-developed and well-nourished.  HENT:  Head: Normocephalic and atraumatic.  Eyes: Conjunctivae are normal. Pupils are equal, round, and reactive to light.  Neck: Normal range of motion.  Cardiovascular: Normal rate and regular rhythm.   Respiratory: Effort normal.  GI: Soft.  Musculoskeletal:  Right foot has a moderately prominent bunion with some pain to palpation in that area. She also has some pain laterally at the base of her fifth metatarsal. Sensation and motor function are intact in her feet with palpable pulses  on both sides.  Neurological: She is alert and oriented to person, place, and time.  Skin: Skin is warm and dry.  Psychiatric: She has a normal mood and affect. Her behavior is normal. Judgment and thought content normal.     Assessment/Plan Assessment: Right foot bunion with Morbid obesity, history of PE, and sleep apnea  Plan: [name] has lost substantial weight. Her greatest problem is her foot and she would like that addressed. I suppose it's relatively safe at this point to have it done. I've encouraged an ankle block anesthetic maybe with some light sedation. With her history of PE and sleep apnea I think that's probably best accomplished in the main operating room. Our plan will be to perform a bunionectomy with a Chevron osteotomy. I reviewed risk of  anesthesia and infection related to the intervention.  Dashaun Onstott, Larwance Sachs 01/22/2015, 3:51 PM

## 2015-01-23 ENCOUNTER — Encounter (HOSPITAL_COMMUNITY): Payer: Self-pay

## 2015-01-23 ENCOUNTER — Encounter (HOSPITAL_COMMUNITY)
Admission: RE | Admit: 2015-01-23 | Discharge: 2015-01-23 | Disposition: A | Discharge status: Home / Self Care | Payer: Medicare Other | Source: Ambulatory Visit | Attending: Orthopaedic Surgery | Admitting: Orthopaedic Surgery

## 2015-01-23 DIAGNOSIS — Z01818 Encounter for other preprocedural examination: Secondary | ICD-10-CM | POA: Diagnosis not present

## 2015-01-23 DIAGNOSIS — Z01812 Encounter for preprocedural laboratory examination: Secondary | ICD-10-CM | POA: Diagnosis not present

## 2015-01-23 DIAGNOSIS — R9431 Abnormal electrocardiogram [ECG] [EKG]: Secondary | ICD-10-CM | POA: Insufficient documentation

## 2015-01-23 DIAGNOSIS — E119 Type 2 diabetes mellitus without complications: Secondary | ICD-10-CM | POA: Diagnosis not present

## 2015-01-23 DIAGNOSIS — K219 Gastro-esophageal reflux disease without esophagitis: Secondary | ICD-10-CM | POA: Diagnosis not present

## 2015-01-23 DIAGNOSIS — Z01810 Encounter for preprocedural cardiovascular examination: Secondary | ICD-10-CM | POA: Diagnosis not present

## 2015-01-23 LAB — CBC
HCT: 31.4 % — ABNORMAL LOW (ref 36.0–46.0)
Hemoglobin: 10.3 g/dL — ABNORMAL LOW (ref 12.0–15.0)
MCH: 30.9 pg (ref 26.0–34.0)
MCHC: 32.8 g/dL (ref 30.0–36.0)
MCV: 94.3 fL (ref 78.0–100.0)
Platelets: 125 10*3/uL — ABNORMAL LOW (ref 150–400)
RBC: 3.33 MIL/uL — ABNORMAL LOW (ref 3.87–5.11)
RDW: 14.7 % (ref 11.5–15.5)
WBC: 6.1 10*3/uL (ref 4.0–10.5)

## 2015-01-23 LAB — BASIC METABOLIC PANEL
Anion gap: 10 (ref 5–15)
BUN: 23 mg/dL (ref 6–23)
CO2: 26 mmol/L (ref 19–32)
Calcium: 8.6 mg/dL (ref 8.4–10.5)
Chloride: 101 mmol/L (ref 96–112)
Creatinine, Ser: 1.19 mg/dL — ABNORMAL HIGH (ref 0.50–1.10)
GFR calc Af Amer: 56 mL/min — ABNORMAL LOW (ref >90)
GFR calc non Af Amer: 48 mL/min — ABNORMAL LOW (ref >90)
Glucose, Bld: 70 mg/dL (ref 70–99)
Potassium: 3.6 mmol/L (ref 3.5–5.1)
Sodium: 137 mmol/L (ref 135–145)

## 2015-01-23 LAB — GLUCOSE, CAPILLARY
Glucose-Capillary: 42 mg/dL — CL (ref 70–99)
Glucose-Capillary: 75 mg/dL (ref 70–99)

## 2015-01-23 NOTE — Pre-Procedure Instructions (Signed)
Sophee A Ellis-Hill  01/23/2015   Your procedure is scheduled on:  Tuesday, January 28, 2015 at 1:05 PM.   Report to [facility] Entrance "A" Admitting Office at 11:00 AM.   Call this number if you have problems the morning of surgery: [phone]               Any questions prior to day of surgery, please call [phone] between 8 & 4 PM.    Remember:   Do not eat food or drink liquids after midnight Monday, 01/27/15.   Take these medicines the morning of surgery with A SIP OF WATER: Citalopram (CELEXA), Omeprazole (PRILOSEC), Symbicort inhaler, Alprazolam Duanne Moron) - if needed. Albuterol inhaler  Stop Aspirin 7 days prior to surgery.  Do not take any diabetic medications the morning of surgery.   Do not wear jewelry, make-up or nail polish.  Do not wear lotions, powders, or perfumes. You may wear deodorant.  Do not shave 48 hours prior to surgery.   Do not bring valuables to the hospital.  [facility] is not responsible                  for any belongings or valuables.               Contacts, dentures or bridgework may not be worn into surgery.  Leave suitcase in the car. After surgery it may be brought to your room.  For patients admitted to the hospital, discharge time is determined by your                treatment team.               Patients discharged the day of surgery will not be allowed to drive home.    Special Instructions: [place] - Preparing for Surgery  Before surgery, you can play an important role.  Because skin is not sterile, your skin needs to be as free of germs as possible.  You can reduce the number of germs on you skin by washing with CHG (chlorahexidine gluconate) soap before surgery.  CHG is an antiseptic cleaner which kills germs and bonds with the skin to continue killing germs even after washing.  Please DO NOT use if you have an allergy to CHG or antibacterial soaps.  If your skin becomes reddened/irritated stop using the CHG and inform your  nurse when you arrive at Short Stay.  Do not shave (including legs and underarms) for at least 48 hours prior to the first CHG shower.  You may shave your face.  Please follow these instructions carefully:   1.  Shower with CHG Soap the night before surgery and the                                morning of Surgery.  2.  If you choose to wash your hair, wash your hair first as usual with your       normal shampoo.  3.  After you shampoo, rinse your hair and body thoroughly to remove the                      Shampoo.  4.  Use CHG as you would any other liquid soap.  You can apply chg directly       to the skin and wash gently with scrungie or a clean washcloth.  5.  Apply  the CHG Soap to your body ONLY FROM THE NECK DOWN.        Do not use on open wounds or open sores.  Avoid contact with your eyes, ears, mouth and genitals (private parts).  Wash genitals (private parts) with your normal soap.  6.  Wash thoroughly, paying special attention to the area where your surgery        will be performed.  7.  Thoroughly rinse your body with warm water from the neck down.  8.  DO NOT shower/wash with your normal soap after using and rinsing off       the CHG Soap.  9.  Pat yourself dry with a clean towel.            10.  Wear clean pajamas.            11.  Place clean sheets on your bed the night of your first shower and do not        sleep with pets.  Day of Surgery  Do not apply any lotions the morning of surgery.  Please wear clean clothes to the hospital.     Please read over the following fact sheets that you were given: Pain Booklet, Coughing and Deep Breathing and Surgical Site Infection Prevention

## 2015-01-23 NOTE — Progress Notes (Signed)
CBC rechecked and it was 75.

## 2015-01-23 NOTE — Progress Notes (Signed)
Anesthesia Chart Review:  Patient is a 64 year old female scheduled for right bunionectomy on 01/28/15 by Dr. Rhona Raider.  Case is posted for [facility].  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  BMI is consistent with morbid obesity. PCP is listed as Dr. Juanda Crumble Record in [place].  Pulmonologist is Dr. Michela Pitcher (Care Everywhere). Last cardiology visit was 03/2014 with Dr. Marykay Lex with [facility] (Care Everywhere) with PRN follow-up recommended.  Meds include albuterol, Xanax, ASA, Symbicort, Sinemet, Celexa, Flonase, Lasix, glucotrol, Norco, Singulair, metformin, Prilosec, KCl, pravastatin, spirolactone, Topamax.  01/23/15 EKG: SR with premature supraventricular complexes, non-specific T wave abnormality. RSR prime pattern in V1 and V2.    Cardiac cath on 11/27/13 (Care Everywhere): Widely patent coronaries - no obstructive lesions. LVH with EF 60%.  Echo on 03/16/11 (Care Everywhere):  Technically difficult study Normal left ventricular size Preserved left ventricular systolic function Unable to fully assess regional wall motion No significant valve stenosis No significant valvular regurgitation No pericardial effusion No old study for comparison  Preoperative labs noted.  She had to be treated for asymptomatic hypoglycemia on arrival to PAT (CBG of 45; patient had taken DM meds and only drank tea today.). Post visit glucose was 75.  H/H 10.3/31.4.  A1C 5.2 on 11/14/14 and AST/ALT were WNL on 11/14/14 (Care Everywhere). She was instructed to hold DM meds on the day of surgery and will get a CBG on arrival.  If no acute changes I anticipate that she can proceed.  She is morbidly obese with OSA without current use of CPAP. Definitive anesthesia plan to be determined on  the day of surgery.   George Hugh [facility] Short Stay Center/Anesthesiology Phone [phone] 01/23/2015 5:59 PM

## 2015-01-23 NOTE — Progress Notes (Signed)
[name], NT informed Nurse that patients CBG was 42. Patient was asymptomatic. Nurse got patient two Coke's to drink. Nurse offered patient graham crackers and peanut butter. Patient stated she was not hungry and did not want any. Nurse then waited a few minutes and asked patient to please eat a graham cracker. Patient finally agreed to eat one and a half graham cracker and then stated "I'm full." Nurse asked patient if she had taken her diabetic medications this morning and patient stated she had taken them, but she had drank a "full glass of tea" before coming to PAT. Nurse explained to patient that she needed to consume food if she was going to be taking her diabetic medications. Patient informed Nurse that she did not go to sleep until 0700 this morning because she had to pick up her sister from dialysis and she was afraid that she would oversleep, and that she was worried about congress doing away with people who are on disability. Nurse informed patient that it was very important that she try not to worry and that she needed to develop better eating and sleep habits.    PCP is Juanda Crumble Record. Patient denied having any chest pain, shortness of breath, stress test, or cardiac cath. Patient did inform Nurse that she has sleep apnea and wears a CPAP mask at bedtime. Patient informed Nurse that her CPAP mask is not fitting properly and leaves a mark on the bridge of her nose.   Gauze and tape noted on patients left hand. Patient stated she cut herself with a knife trying to get the top off of a "no spill" container. No active drainage noted.

## 2015-01-24 LAB — HEMOGLOBIN A1C
Hgb A1c MFr Bld: 5.1 % (ref 4.8–5.6)
Mean Plasma Glucose: 100 mg/dL

## 2015-01-27 MED ORDER — VANCOMYCIN HCL 10 G IV SOLR
1500.0000 mg | INTRAVENOUS | Status: AC
  Administered 2015-01-28: 1500 mg via INTRAVENOUS
  Filled 2015-01-27: qty 1500

## 2015-01-27 MED ORDER — LACTATED RINGERS IV SOLN
INTRAVENOUS | Status: DC
  Administered 2015-01-28: 12:00:00 via INTRAVENOUS

## 2015-01-28 ENCOUNTER — Encounter (HOSPITAL_COMMUNITY): Payer: Self-pay

## 2015-01-28 ENCOUNTER — Ambulatory Visit (HOSPITAL_COMMUNITY)
Admission: RE | Admit: 2015-01-28 | Discharge: 2015-01-28 | Disposition: A | Discharge status: Home / Self Care | Payer: Medicare Other | Source: Ambulatory Visit | Attending: Orthopaedic Surgery | Admitting: Orthopaedic Surgery

## 2015-01-28 ENCOUNTER — Inpatient Hospital Stay (HOSPITAL_COMMUNITY): Payer: Medicare Other | Admitting: Vascular Surgery

## 2015-01-28 ENCOUNTER — Inpatient Hospital Stay (HOSPITAL_COMMUNITY): Payer: Medicare Other | Admitting: Anesthesiology

## 2015-01-28 ENCOUNTER — Encounter (HOSPITAL_COMMUNITY)
Admission: RE | Disposition: A | Discharge status: Home / Self Care | Payer: Self-pay | Source: Ambulatory Visit | Attending: Orthopaedic Surgery

## 2015-01-28 DIAGNOSIS — G4730 Sleep apnea, unspecified: Secondary | ICD-10-CM | POA: Diagnosis not present

## 2015-01-28 DIAGNOSIS — Z86711 Personal history of pulmonary embolism: Secondary | ICD-10-CM | POA: Diagnosis not present

## 2015-01-28 DIAGNOSIS — N189 Chronic kidney disease, unspecified: Secondary | ICD-10-CM | POA: Diagnosis not present

## 2015-01-28 DIAGNOSIS — E119 Type 2 diabetes mellitus without complications: Secondary | ICD-10-CM | POA: Insufficient documentation

## 2015-01-28 DIAGNOSIS — I739 Peripheral vascular disease, unspecified: Secondary | ICD-10-CM | POA: Insufficient documentation

## 2015-01-28 DIAGNOSIS — K219 Gastro-esophageal reflux disease without esophagitis: Secondary | ICD-10-CM | POA: Insufficient documentation

## 2015-01-28 DIAGNOSIS — M2011 Hallux valgus (acquired), right foot: Secondary | ICD-10-CM | POA: Insufficient documentation

## 2015-01-28 DIAGNOSIS — G2581 Restless legs syndrome: Secondary | ICD-10-CM | POA: Diagnosis not present

## 2015-01-28 DIAGNOSIS — M1990 Unspecified osteoarthritis, unspecified site: Secondary | ICD-10-CM | POA: Insufficient documentation

## 2015-01-28 DIAGNOSIS — Z6841 Body Mass Index (BMI) 40.0 and over, adult: Secondary | ICD-10-CM | POA: Diagnosis not present

## 2015-01-28 DIAGNOSIS — G8918 Other acute postprocedural pain: Secondary | ICD-10-CM | POA: Diagnosis not present

## 2015-01-28 HISTORY — DX: Peripheral vascular disease, unspecified: I73.9

## 2015-01-28 HISTORY — DX: Other pulmonary embolism without acute cor pulmonale: I26.99

## 2015-01-28 HISTORY — DX: Sleep apnea, unspecified: G47.30

## 2015-01-28 HISTORY — DX: Unspecified osteoarthritis, unspecified site: M19.90

## 2015-01-28 HISTORY — DX: Gastro-esophageal reflux disease without esophagitis: K21.9

## 2015-01-28 HISTORY — DX: Fatty (change of) liver, not elsewhere classified: K76.0

## 2015-01-28 HISTORY — DX: Chronic kidney disease, unspecified: N18.9

## 2015-01-28 HISTORY — DX: Unspecified cataract: H26.9

## 2015-01-28 HISTORY — DX: Anemia, unspecified: D64.9

## 2015-01-28 HISTORY — PX: BUNIONECTOMY: SHX129

## 2015-01-28 HISTORY — DX: Reserved for inherently not codable concepts without codable children: IMO0001

## 2015-01-28 HISTORY — DX: Restless legs syndrome: G25.81

## 2015-01-28 LAB — GLUCOSE, CAPILLARY
Glucose-Capillary: 104 mg/dL — ABNORMAL HIGH (ref 70–99)
Glucose-Capillary: 118 mg/dL — ABNORMAL HIGH (ref 70–99)

## 2015-01-28 SURGERY — BUNIONECTOMY
Anesthesia: Regional | Site: Foot | Laterality: Right

## 2015-01-28 MED ORDER — PROPOFOL 10 MG/ML IV BOLUS
INTRAVENOUS | Status: DC | PRN
  Administered 2015-01-28: 40 mg via INTRAVENOUS
  Administered 2015-01-28: 200 mg via INTRAVENOUS

## 2015-01-28 MED ORDER — EPHEDRINE SULFATE 50 MG/ML IJ SOLN
INTRAMUSCULAR | Status: DC | PRN
  Administered 2015-01-28: 10 mg via INTRAVENOUS

## 2015-01-28 MED ORDER — BUPIVACAINE-EPINEPHRINE (PF) 0.25% -1:200000 IJ SOLN
INTRAMUSCULAR | Status: AC
  Filled 2015-01-28: qty 30

## 2015-01-28 MED ORDER — MIDAZOLAM HCL 2 MG/2ML IJ SOLN
INTRAMUSCULAR | Status: AC
  Filled 2015-01-28: qty 2

## 2015-01-28 MED ORDER — LIDOCAINE HCL (CARDIAC) 20 MG/ML IV SOLN
INTRAVENOUS | Status: DC | PRN
  Administered 2015-01-28: 50 mg via INTRAVENOUS

## 2015-01-28 MED ORDER — CHLORHEXIDINE GLUCONATE 4 % EX LIQD
60.0000 mL | Freq: Once | CUTANEOUS | Status: DC
  Filled 2015-01-28: qty 60

## 2015-01-28 MED ORDER — ROCURONIUM BROMIDE 50 MG/5ML IV SOLN
INTRAVENOUS | Status: AC
  Filled 2015-01-28: qty 1

## 2015-01-28 MED ORDER — PROPOFOL 10 MG/ML IV BOLUS
INTRAVENOUS | Status: AC
  Filled 2015-01-28: qty 20

## 2015-01-28 MED ORDER — FENTANYL CITRATE 0.05 MG/ML IJ SOLN
INTRAMUSCULAR | Status: DC | PRN
Start: 2015-01-28 — End: 2015-01-28
  Administered 2015-01-28: 100 ug via INTRAVENOUS

## 2015-01-28 MED ORDER — ONDANSETRON HCL 4 MG/2ML IJ SOLN
INTRAMUSCULAR | Status: AC
  Filled 2015-01-28: qty 2

## 2015-01-28 MED ORDER — FENTANYL CITRATE 0.05 MG/ML IJ SOLN
INTRAMUSCULAR | Status: AC
  Filled 2015-01-28: qty 5

## 2015-01-28 MED ORDER — LIDOCAINE HCL (CARDIAC) 20 MG/ML IV SOLN
INTRAVENOUS | Status: AC
  Filled 2015-01-28: qty 5

## 2015-01-28 MED ORDER — ONDANSETRON HCL 4 MG/2ML IJ SOLN
INTRAMUSCULAR | Status: DC | PRN
  Administered 2015-01-28: 4 mg via INTRAVENOUS

## 2015-01-28 MED ORDER — ASPIRIN EC 81 MG PO TBEC: 325.0000 mg | DELAYED_RELEASE_TABLET | Freq: Two times a day (BID) | ORAL | Status: DC

## 2015-01-28 MED ORDER — DEXAMETHASONE SODIUM PHOSPHATE 4 MG/ML IJ SOLN
INTRAMUSCULAR | Status: DC | PRN
  Administered 2015-01-28: 4 mg via INTRAVENOUS

## 2015-01-28 MED ORDER — FENTANYL CITRATE 0.05 MG/ML IJ SOLN
INTRAMUSCULAR | Status: AC
  Administered 2015-01-28: 100 ug
  Filled 2015-01-28: qty 2

## 2015-01-28 MED ORDER — PROMETHAZINE HCL 25 MG/ML IJ SOLN: 6.2500 mg | INTRAMUSCULAR | Status: DC | PRN

## 2015-01-28 MED ORDER — FENTANYL CITRATE 0.05 MG/ML IJ SOLN: 25.0000 ug | INTRAMUSCULAR | Status: DC | PRN

## 2015-01-28 MED ORDER — PHENYLEPHRINE HCL 10 MG/ML IJ SOLN
INTRAMUSCULAR | Status: DC | PRN
  Administered 2015-01-28: 80 ug via INTRAVENOUS
  Administered 2015-01-28: 40 ug via INTRAVENOUS
  Administered 2015-01-28: 80 ug via INTRAVENOUS
  Administered 2015-01-28: 120 ug via INTRAVENOUS
  Administered 2015-01-28 (×2): 40 ug via INTRAVENOUS

## 2015-01-28 MED ORDER — LACTATED RINGERS IV SOLN: INTRAVENOUS | Status: DC

## 2015-01-28 MED ORDER — GLYCOPYRROLATE 0.2 MG/ML IJ SOLN
INTRAMUSCULAR | Status: AC
  Filled 2015-01-28: qty 2

## 2015-01-28 MED ORDER — NEOSTIGMINE METHYLSULFATE 10 MG/10ML IV SOLN
INTRAVENOUS | Status: AC
  Filled 2015-01-28: qty 1

## 2015-01-28 MED ORDER — ARTIFICIAL TEARS OP OINT
TOPICAL_OINTMENT | OPHTHALMIC | Status: AC
  Filled 2015-01-28: qty 3.5

## 2015-01-28 MED ORDER — OXYCODONE-ACETAMINOPHEN 5-325 MG PO TABS: 1.0000 | ORAL_TABLET | ORAL | Status: DC | PRN

## 2015-01-28 MED ORDER — MEPERIDINE HCL 25 MG/ML IJ SOLN: 6.2500 mg | INTRAMUSCULAR | Status: DC | PRN

## 2015-01-28 MED ORDER — BUPIVACAINE-EPINEPHRINE (PF) 0.25% -1:200000 IJ SOLN
INTRAMUSCULAR | Status: DC | PRN
  Administered 2015-01-28: 10 mL via PERINEURAL

## 2015-01-28 SURGICAL SUPPLY — 48 items
BANDAGE ELASTIC 4 VELCRO ST LF (GAUZE/BANDAGES/DRESSINGS) ×1 IMPLANT
BNDG CONFORM 2 STRL LF (GAUZE/BANDAGES/DRESSINGS) ×1 IMPLANT
BNDG GAUZE ELAST 4 BULKY (GAUZE/BANDAGES/DRESSINGS) ×2 IMPLANT
CORDS BIPOLAR (ELECTRODE) ×1 IMPLANT
COVER SURGICAL LIGHT HANDLE (MISCELLANEOUS) ×2 IMPLANT
CUFF TOURNIQUET SINGLE 18IN (TOURNIQUET CUFF) IMPLANT
CUFF TOURNIQUET SINGLE 24IN (TOURNIQUET CUFF) IMPLANT
CUFF TOURNIQUET SINGLE 34IN LL (TOURNIQUET CUFF) ×1 IMPLANT
CUFF TOURNIQUET SINGLE 44IN (TOURNIQUET CUFF) IMPLANT
DRAPE C-ARM MINI 42X72 WSTRAPS (DRAPES) ×1 IMPLANT
DRSG ADAPTIC 3X8 NADH LF (GAUZE/BANDAGES/DRESSINGS) ×1 IMPLANT
DRSG EMULSION OIL 3X3 NADH (GAUZE/BANDAGES/DRESSINGS) IMPLANT
DURAPREP 26ML APPLICATOR (WOUND CARE) ×2 IMPLANT
ELECT REM PT RETURN 9FT ADLT (ELECTROSURGICAL)
ELECTRODE REM PT RTRN 9FT ADLT (ELECTROSURGICAL) IMPLANT
GAUZE SPONGE 4X4 12PLY STRL (GAUZE/BANDAGES/DRESSINGS) ×1 IMPLANT
GAUZE XEROFORM 1X8 LF (GAUZE/BANDAGES/DRESSINGS) IMPLANT
GLOVE BIO SURGEON STRL SZ 6.5 (GLOVE) ×2 IMPLANT
GLOVE BIO SURGEON STRL SZ8 (GLOVE) ×8 IMPLANT
GLOVE BIOGEL PI IND STRL 7.0 (GLOVE) IMPLANT
GLOVE BIOGEL PI IND STRL 7.5 (GLOVE) IMPLANT
GLOVE BIOGEL PI IND STRL 8 (GLOVE) ×1 IMPLANT
GLOVE BIOGEL PI INDICATOR 7.0 (GLOVE) ×2
GLOVE BIOGEL PI INDICATOR 7.5 (GLOVE) ×1
GLOVE BIOGEL PI INDICATOR 8 (GLOVE) ×1
GOWN STRL REUS W/ TWL LRG LVL3 (GOWN DISPOSABLE) ×2 IMPLANT
GOWN STRL REUS W/TWL 2XL LVL3 (GOWN DISPOSABLE) ×2 IMPLANT
GOWN STRL REUS W/TWL LRG LVL3 (GOWN DISPOSABLE) ×4
HOVERMATT SINGLE USE (MISCELLANEOUS) ×1 IMPLANT
KIT BASIN OR (CUSTOM PROCEDURE TRAY) ×2 IMPLANT
KIT ORTHOSORB 1-PIN  2.0X40 (Joint) ×1 IMPLANT
KIT ORTHOSORB 1-PIN 2.0X40 (Joint) IMPLANT
KIT ROOM TURNOVER OR (KITS) ×2 IMPLANT
MANIFOLD NEPTUNE II (INSTRUMENTS) ×2 IMPLANT
NEEDLE 22X1 1/2 (OR ONLY) (NEEDLE) ×1 IMPLANT
NS IRRIG 1000ML POUR BTL (IV SOLUTION) ×2 IMPLANT
PACK ORTHO EXTREMITY (CUSTOM PROCEDURE TRAY) ×2 IMPLANT
PAD ARMBOARD 7.5X6 YLW CONV (MISCELLANEOUS) ×4 IMPLANT
SPECIMEN JAR SMALL (MISCELLANEOUS) ×2 IMPLANT
SUCTION FRAZIER TIP 10 FR DISP (SUCTIONS) IMPLANT
SUT ETHILON 4 0 PS 2 18 (SUTURE) ×2 IMPLANT
SUT VIC AB 0 CT1 27 (SUTURE) ×2
SUT VIC AB 0 CT1 27XBRD ANBCTR (SUTURE) IMPLANT
SYR CONTROL 10ML LL (SYRINGE) ×1 IMPLANT
TOWEL OR 17X24 6PK STRL BLUE (TOWEL DISPOSABLE) ×2 IMPLANT
TOWEL OR 17X26 10 PK STRL BLUE (TOWEL DISPOSABLE) ×2 IMPLANT
TUBE CONNECTING 12X1/4 (SUCTIONS) IMPLANT
WATER STERILE IRR 1000ML POUR (IV SOLUTION) ×2 IMPLANT

## 2015-01-28 NOTE — Anesthesia Postprocedure Evaluation (Signed)
  Anesthesia Post-op Note  Patient: [name]  Procedure(s) Performed: Procedure(s): BUNIONECTOMY (Right)  Patient Location: PACU  Anesthesia Type:General  Level of Consciousness: awake and alert   Airway and Oxygen Therapy: Patient Spontanous Breathing  Post-op Pain: none  Post-op Assessment: Post-op Vital signs reviewed, Patient's Cardiovascular Status Stable and Respiratory Function Stable  Post-op Vital Signs: Reviewed  Filed Vitals:   01/28/15 1503  BP: 105/41  Pulse: 83  Temp:   Resp: 16    Complications: No apparent anesthesia complications

## 2015-01-28 NOTE — Discharge Instructions (Signed)
°What to eat: ° °For your first meals, you should eat lightly; only small meals initially.  If you do not have nausea, you may eat larger meals.  Avoid spicy, greasy and heavy food.   ° °General Anesthesia, Adult, Care After  °Refer to this sheet in the next few weeks. These instructions provide you with information on caring for yourself after your procedure. Your health care provider may also give you more specific instructions. Your treatment has been planned according to current medical practices, but problems sometimes occur. Call your health care provider if you have any problems or questions after your procedure.  °WHAT TO EXPECT AFTER THE PROCEDURE  °After the procedure, it is typical to experience:  °Sleepiness.  °Nausea and vomiting. °HOME CARE INSTRUCTIONS  °For the first 24 hours after general anesthesia:  °Have a responsible person with you.  °Do not drive a car. If you are alone, do not take public transportation.  °Do not drink alcohol.  °Do not take medicine that has not been prescribed by your health care provider.  °Do not sign important papers or make important decisions.  °You may resume a normal diet and activities as directed by your health care provider.  °Change bandages (dressings) as directed.  °If you have questions or problems that seem related to general anesthesia, call the hospital and ask for the anesthetist or anesthesiologist on call. °SEEK MEDICAL CARE IF:  °You have nausea and vomiting that continue the day after anesthesia.  °You develop a rash. °SEEK IMMEDIATE MEDICAL CARE IF:  °You have difficulty breathing.  °You have chest pain.  °You have any allergic problems. °Document Released: 03/21/2001 Document Revised: 08/15/2013 Document Reviewed: 06/28/2013  °ExitCare® Patient Information ©2014 [employer].  ° °Sore Throat  ° ° °A sore throat is a painful, burning, sore, or scratchy feeling of the throat. There may be pain or tenderness when swallowing or talking. You may have  other symptoms with a sore throat. These include coughing, sneezing, fever, or a swollen neck. A sore throat is often the first sign of another sickness. These sicknesses may include a cold, flu, strep throat, or an infection called mono. Most sore throats go away without medical treatment.  °HOME CARE  °Only take medicine as told by your doctor.  °Drink enough fluids to keep your pee (urine) clear or pale yellow.  °Rest as needed.  °Try using throat sprays, lozenges, or suck on hard candy (if older than 4 years or as told).  °Sip warm liquids, such as broth, herbal tea, or warm water with honey. Try sucking on frozen ice pops or drinking cold liquids.  °Rinse the mouth (gargle) with salt water. Mix 1 teaspoon salt with 8 ounces of water.  °Do not smoke. Avoid being around others when they are smoking.  °Put a humidifier in your bedroom at night to moisten the air. You can also turn on a hot shower and sit in the bathroom for 5-10 minutes. Be sure the bathroom door is closed. °GET HELP RIGHT AWAY IF:  °You have trouble breathing.  °You cannot swallow fluids, soft foods, or your spit (saliva).  °You have more puffiness (swelling) in the throat.  °Your sore throat does not get better in 7 days.  °You feel sick to your stomach (nauseous) and throw up (vomit).  °You have a fever or lasting symptoms for more than 2-3 days.  °You have a fever and your symptoms suddenly get worse. °MAKE SURE YOU:  °Understand these   instructions.  °Will watch your condition.  °Will get help right away if you are not doing well or get worse. °Document Released: 09/21/2008 Document Revised: 09/06/2012 Document Reviewed: 08/20/2012  °ExitCare® Patient Information ©2015 [employer]. This information is not intended to replace advice given to you by your health care provider. Make sure you discuss any questions you have with your health care provider.  ° ° ° °

## 2015-01-28 NOTE — Op Note (Addendum)
PRE-OP DIAGNOSIS:  right foot hallux valgus POST-OP DIAGNOSIS:  Same PROCEDURE:  right foot Chevron osteotomy SURGEON:  Nevea Spiewak MD ASSISTANT:  Loni Dolly PA-C ANESTHESIA:  General and block  Indication for procedure:  [name] is a 64 y.o. female with a long history of a painful foot deformity.  Having failed non-operative measures of pads and wide shoes surgery was eventually discussed.  With a moderate deformity bunionectomy with Chevron osteotomy was felt to be the best solution.  Informed operative consent was obtained after discussion of risks including reaction to anesthesia, infection, neurovascular injury, and nonunion.  Summary of findings and procedure:  Under above anesthetic a bunionectomy and Chevron osteotomy procedure was performed.  The deformity was corrected and stabilized with a single OrthoSorb pin.  I used fluoroscopy throughout the case and read all views myself.  Loni Dolly assisted throughout and was invaluable to the completion of the case in that he retracted and maintained reduction while I placed pin.  He also closed with me to help minimize OR time.   Description of procedure:  The patient was taken to the OR suite where the above anesthetic was applied and then was prepped and draped in normal sterile fashion.  After the administration of vancomycin pre-op antibiotic the leg was elevated, exsanguinated, and a tourniquet inflated about the calf.  A dorsomedial incision was made over the MTP joint with dissection down to capsule.  A Y shaped capsular incision was made based distally.  The bunion was exposed and resected with an oscillating saw.  I then made a Chevron cut and displaced the distal portion in a lateral direction about 61mm.  A guide pin was then placed for the OrthoSorb pin and position confirmed by fluoro.  I then placed the OrthoSorb pin stabilizing the osteotomy.  The saw was used to remove the resultant medial prominence.  The would was irrigated.   Capsule was closed with O vicryl and skin was closed with interrupted nylon sutures.  Tourniquet was deflated and the foot became pick and warm immediately.  Adaptic was applied followed by dry gauze and a loose Ace wrap.  Estimated blood loss and intraoperative fluids can be obtained from anesthesia records.  Disposition:  The patient was scheduled to go home same day and will be contacted by me tonight.  Follow-up will be in the office in about a week.   [name] G

## 2015-01-28 NOTE — Progress Notes (Signed)
Orthopedic Tech Progress Note Patient Details:  [name] 1951/08/29 [id number]  Ortho Devices Type of Ortho Device: Postop shoe/boot Ortho Device/Splint Interventions: Ordered As ordered by Dr. Bonnee Quin, Micaella Gitto 01/28/2015, 3:02 PM

## 2015-01-28 NOTE — Interval H&P Note (Signed)
OK for surgery PD 

## 2015-01-28 NOTE — Anesthesia Preprocedure Evaluation (Signed)
Anesthesia Evaluation  Patient identified by MRN, date of birth, ID band Patient awake    Airway        Dental   Pulmonary shortness of breath, sleep apnea (not CPAP compliant) ,          Cardiovascular  CATH 2014 EF 65% normal coronarys   Neuro/Psych negative neurological ROS  negative psych ROS   GI/Hepatic Neg liver ROS, GERD-  Controlled,Last LFT OK   Endo/Other  diabetes, Well Controlled, Type 2  Renal/GU      Musculoskeletal   Abdominal   Peds  Hematology 10/31 H/H   Anesthesia Other Findings Super, super morbid obesity BMI 60  Reproductive/Obstetrics                             Anesthesia Physical Anesthesia Plan  ASA: IV  Anesthesia Plan: General and Regional   Post-op Pain Management: MAC Combined w/ Regional for Post-op pain   Induction: Intravenous  Airway Management Planned: Oral ETT  Additional Equipment:   Intra-op Plan:   Post-operative Plan: Extubation in OR  Informed Consent: I have reviewed the patients History and Physical, chart, labs and discussed the procedure including the risks, benefits and alternatives for the proposed anesthesia with the patient or authorized representative who has indicated his/her understanding and acceptance.     Plan Discussed with:   Anesthesia Plan Comments: (Minimize narcotics, multimodal pain RX, Tylenol OK)        Anesthesia Quick Evaluation

## 2015-01-28 NOTE — Transfer of Care (Signed)
Immediate Anesthesia Transfer of Care Note  Patient: [name]  Procedure(s) Performed: Procedure(s): BUNIONECTOMY (Right)  Patient Location: PACU  Anesthesia Type:GA combined with regional for post-op pain  Level of Consciousness: awake, alert  and oriented  Airway & Oxygen Therapy: Patient Spontanous Breathing and Patient connected to nasal cannula oxygen  Post-op Assessment: Report given to RN and Post -op Vital signs reviewed and stable  Post vital signs: Reviewed and stable  Last Vitals:  Filed Vitals:   01/28/15 1315  BP: 116/40  Pulse: 78  Temp:   Resp: 26    Complications: No apparent anesthesia complications

## 2015-01-28 NOTE — Anesthesia Procedure Notes (Addendum)
Anesthesia Regional Block:  Ankle block  Pre-Anesthetic Checklist: ,, timeout performed, Correct Patient, Correct Site, Correct Laterality, Correct Procedure, Correct Position, site marked, Risks and benefits discussed,  Surgical consent,  Pre-op evaluation,  At surgeon's request and post-op pain management  Laterality: Right and Lower  Prep: chloraprep       Needles:      Needle Length: 5cm 5 cm Needle Gauge: 25 and 25 G    Additional Needles: Ankle block Narrative:  Start time: 01/28/2015 1:00 PM End time: 01/28/2015 1:12 PM Anesthesiologist: Alexis Frock  Additional Notes: R ankle block, 20cc .5%marcaine, 5 injections, tolerated procedure well   Procedure Name: Intubation Date/Time: 01/28/2015 1:50 PM Performed by: [place] Pink Pre-anesthesia Checklist: Patient identified, Emergency Drugs available, Suction available, Patient being monitored and Timeout performed Patient Re-evaluated:Patient Re-evaluated prior to inductionOxygen Delivery Method: Circle system utilized Preoxygenation: Pre-oxygenation with 100% oxygen Intubation Type: IV induction Ventilation: Two handed mask ventilation required Laryngoscope Size: Glidescope (small adult) Grade View: Grade I Tube type: Oral Tube size: 7.0 mm Number of attempts: 1 Airway Equipment and Method: Video-laryngoscopy Placement Confirmation: positive ETCO2,  ETT inserted through vocal cords under direct vision and breath sounds checked- equal and bilateral Secured at: 20 cm Tube secured with: Tape Dental Injury: Teeth and Oropharynx as per pre-operative assessment

## 2015-01-30 ENCOUNTER — Encounter (HOSPITAL_COMMUNITY): Payer: Self-pay | Admitting: Orthopaedic Surgery

## 2015-02-03 DIAGNOSIS — J329 Chronic sinusitis, unspecified: Secondary | ICD-10-CM | POA: Diagnosis not present

## 2015-02-05 DIAGNOSIS — R51 Headache: Secondary | ICD-10-CM | POA: Diagnosis not present

## 2015-02-07 DIAGNOSIS — Z9889 Other specified postprocedural states: Secondary | ICD-10-CM | POA: Diagnosis not present

## 2015-02-12 DIAGNOSIS — Z9889 Other specified postprocedural states: Secondary | ICD-10-CM | POA: Diagnosis not present

## 2015-02-24 DIAGNOSIS — Z9889 Other specified postprocedural states: Secondary | ICD-10-CM | POA: Diagnosis not present

## 2015-02-25 DIAGNOSIS — N183 Chronic kidney disease, stage 3 (moderate): Secondary | ICD-10-CM | POA: Diagnosis not present

## 2015-02-25 DIAGNOSIS — E1122 Type 2 diabetes mellitus with diabetic chronic kidney disease: Secondary | ICD-10-CM | POA: Diagnosis not present

## 2015-02-25 DIAGNOSIS — E1140 Type 2 diabetes mellitus with diabetic neuropathy, unspecified: Secondary | ICD-10-CM | POA: Diagnosis not present

## 2015-02-25 DIAGNOSIS — I10 Essential (primary) hypertension: Secondary | ICD-10-CM | POA: Diagnosis not present

## 2015-02-25 DIAGNOSIS — E785 Hyperlipidemia, unspecified: Secondary | ICD-10-CM | POA: Diagnosis not present

## 2015-03-03 DIAGNOSIS — N183 Chronic kidney disease, stage 3 (moderate): Secondary | ICD-10-CM | POA: Diagnosis not present

## 2015-03-03 DIAGNOSIS — E1122 Type 2 diabetes mellitus with diabetic chronic kidney disease: Secondary | ICD-10-CM | POA: Diagnosis not present

## 2015-03-03 DIAGNOSIS — E1140 Type 2 diabetes mellitus with diabetic neuropathy, unspecified: Secondary | ICD-10-CM | POA: Diagnosis not present

## 2015-03-03 DIAGNOSIS — Z79899 Other long term (current) drug therapy: Secondary | ICD-10-CM | POA: Diagnosis not present

## 2015-03-03 DIAGNOSIS — I10 Essential (primary) hypertension: Secondary | ICD-10-CM | POA: Diagnosis not present

## 2015-03-03 DIAGNOSIS — E785 Hyperlipidemia, unspecified: Secondary | ICD-10-CM | POA: Diagnosis not present

## 2015-03-03 DIAGNOSIS — J309 Allergic rhinitis, unspecified: Secondary | ICD-10-CM | POA: Diagnosis not present

## 2015-03-03 DIAGNOSIS — M25562 Pain in left knee: Secondary | ICD-10-CM | POA: Diagnosis not present

## 2015-03-03 DIAGNOSIS — F419 Anxiety disorder, unspecified: Secondary | ICD-10-CM | POA: Diagnosis not present

## 2015-03-06 DIAGNOSIS — W1830XA Fall on same level, unspecified, initial encounter: Secondary | ICD-10-CM | POA: Diagnosis not present

## 2015-03-06 DIAGNOSIS — S51011A Laceration without foreign body of right elbow, initial encounter: Secondary | ICD-10-CM | POA: Diagnosis not present

## 2015-03-06 DIAGNOSIS — R51 Headache: Secondary | ICD-10-CM | POA: Diagnosis not present

## 2015-03-06 DIAGNOSIS — R52 Pain, unspecified: Secondary | ICD-10-CM | POA: Diagnosis not present

## 2015-03-06 DIAGNOSIS — W19XXXA Unspecified fall, initial encounter: Secondary | ICD-10-CM | POA: Diagnosis not present

## 2015-03-06 DIAGNOSIS — M25521 Pain in right elbow: Secondary | ICD-10-CM | POA: Diagnosis not present

## 2015-03-06 DIAGNOSIS — I10 Essential (primary) hypertension: Secondary | ICD-10-CM | POA: Diagnosis not present

## 2015-03-06 DIAGNOSIS — M25511 Pain in right shoulder: Secondary | ICD-10-CM | POA: Diagnosis not present

## 2015-03-06 DIAGNOSIS — S161XXA Strain of muscle, fascia and tendon at neck level, initial encounter: Secondary | ICD-10-CM | POA: Diagnosis not present

## 2015-03-06 DIAGNOSIS — S060X0A Concussion without loss of consciousness, initial encounter: Secondary | ICD-10-CM | POA: Diagnosis not present

## 2015-03-06 DIAGNOSIS — R2689 Other abnormalities of gait and mobility: Secondary | ICD-10-CM | POA: Diagnosis not present

## 2015-03-06 DIAGNOSIS — S92311A Displaced fracture of first metatarsal bone, right foot, initial encounter for closed fracture: Secondary | ICD-10-CM | POA: Diagnosis not present

## 2015-03-06 DIAGNOSIS — M546 Pain in thoracic spine: Secondary | ICD-10-CM | POA: Diagnosis not present

## 2015-03-10 DIAGNOSIS — S9031XA Contusion of right foot, initial encounter: Secondary | ICD-10-CM | POA: Diagnosis not present

## 2015-03-10 DIAGNOSIS — M79671 Pain in right foot: Secondary | ICD-10-CM | POA: Diagnosis not present

## 2015-04-07 DIAGNOSIS — Z9889 Other specified postprocedural states: Secondary | ICD-10-CM | POA: Diagnosis not present

## 2015-04-14 DIAGNOSIS — N183 Chronic kidney disease, stage 3 (moderate): Secondary | ICD-10-CM | POA: Diagnosis not present

## 2015-04-14 DIAGNOSIS — D509 Iron deficiency anemia, unspecified: Secondary | ICD-10-CM | POA: Diagnosis not present

## 2015-04-14 DIAGNOSIS — E538 Deficiency of other specified B group vitamins: Secondary | ICD-10-CM | POA: Diagnosis not present

## 2015-04-16 DIAGNOSIS — Z9889 Other specified postprocedural states: Secondary | ICD-10-CM | POA: Diagnosis not present

## 2015-05-01 DIAGNOSIS — D509 Iron deficiency anemia, unspecified: Secondary | ICD-10-CM | POA: Diagnosis not present

## 2015-05-01 DIAGNOSIS — E538 Deficiency of other specified B group vitamins: Secondary | ICD-10-CM | POA: Diagnosis not present

## 2015-05-19 DIAGNOSIS — M79671 Pain in right foot: Secondary | ICD-10-CM | POA: Diagnosis not present

## 2015-06-06 DIAGNOSIS — J309 Allergic rhinitis, unspecified: Secondary | ICD-10-CM | POA: Diagnosis not present

## 2015-06-06 DIAGNOSIS — E1140 Type 2 diabetes mellitus with diabetic neuropathy, unspecified: Secondary | ICD-10-CM | POA: Diagnosis not present

## 2015-06-06 DIAGNOSIS — E1122 Type 2 diabetes mellitus with diabetic chronic kidney disease: Secondary | ICD-10-CM | POA: Diagnosis not present

## 2015-06-06 DIAGNOSIS — M25562 Pain in left knee: Secondary | ICD-10-CM | POA: Diagnosis not present

## 2015-06-06 DIAGNOSIS — Z79899 Other long term (current) drug therapy: Secondary | ICD-10-CM | POA: Diagnosis not present

## 2015-06-06 DIAGNOSIS — E785 Hyperlipidemia, unspecified: Secondary | ICD-10-CM | POA: Diagnosis not present

## 2015-06-06 DIAGNOSIS — I10 Essential (primary) hypertension: Secondary | ICD-10-CM | POA: Diagnosis not present

## 2015-06-06 DIAGNOSIS — N183 Chronic kidney disease, stage 3 (moderate): Secondary | ICD-10-CM | POA: Diagnosis not present

## 2015-06-09 DIAGNOSIS — N182 Chronic kidney disease, stage 2 (mild): Secondary | ICD-10-CM | POA: Diagnosis not present

## 2015-06-16 DIAGNOSIS — E119 Type 2 diabetes mellitus without complications: Secondary | ICD-10-CM | POA: Diagnosis not present

## 2015-06-17 DIAGNOSIS — D179 Benign lipomatous neoplasm, unspecified: Secondary | ICD-10-CM | POA: Diagnosis not present

## 2015-06-17 DIAGNOSIS — K219 Gastro-esophageal reflux disease without esophagitis: Secondary | ICD-10-CM | POA: Diagnosis not present

## 2015-06-17 DIAGNOSIS — E1122 Type 2 diabetes mellitus with diabetic chronic kidney disease: Secondary | ICD-10-CM | POA: Diagnosis not present

## 2015-06-17 DIAGNOSIS — N183 Chronic kidney disease, stage 3 (moderate): Secondary | ICD-10-CM | POA: Diagnosis not present

## 2015-06-17 DIAGNOSIS — M7711 Lateral epicondylitis, right elbow: Secondary | ICD-10-CM | POA: Diagnosis not present

## 2015-06-17 DIAGNOSIS — I10 Essential (primary) hypertension: Secondary | ICD-10-CM | POA: Diagnosis not present

## 2015-07-03 DIAGNOSIS — M7541 Impingement syndrome of right shoulder: Secondary | ICD-10-CM | POA: Diagnosis not present

## 2015-07-03 DIAGNOSIS — M7711 Lateral epicondylitis, right elbow: Secondary | ICD-10-CM | POA: Diagnosis not present

## 2015-07-03 DIAGNOSIS — M25531 Pain in right wrist: Secondary | ICD-10-CM | POA: Diagnosis not present

## 2015-07-26 DIAGNOSIS — H9201 Otalgia, right ear: Secondary | ICD-10-CM | POA: Diagnosis not present

## 2015-07-26 DIAGNOSIS — J329 Chronic sinusitis, unspecified: Secondary | ICD-10-CM | POA: Diagnosis not present

## 2015-07-28 DIAGNOSIS — H1013 Acute atopic conjunctivitis, bilateral: Secondary | ICD-10-CM | POA: Diagnosis not present

## 2015-07-28 DIAGNOSIS — H35363 Drusen (degenerative) of macula, bilateral: Secondary | ICD-10-CM | POA: Diagnosis not present

## 2015-07-28 DIAGNOSIS — Z880 Allergy status to penicillin: Secondary | ICD-10-CM | POA: Diagnosis not present

## 2015-07-28 DIAGNOSIS — E119 Type 2 diabetes mellitus without complications: Secondary | ICD-10-CM | POA: Diagnosis not present

## 2015-07-28 DIAGNOSIS — Z7982 Long term (current) use of aspirin: Secondary | ICD-10-CM | POA: Diagnosis not present

## 2015-07-28 DIAGNOSIS — Z79891 Long term (current) use of opiate analgesic: Secondary | ICD-10-CM | POA: Diagnosis not present

## 2015-07-28 DIAGNOSIS — Z79899 Other long term (current) drug therapy: Secondary | ICD-10-CM | POA: Diagnosis not present

## 2015-07-28 DIAGNOSIS — R6889 Other general symptoms and signs: Secondary | ICD-10-CM | POA: Diagnosis not present

## 2015-07-28 DIAGNOSIS — Z9089 Acquired absence of other organs: Secondary | ICD-10-CM | POA: Diagnosis not present

## 2015-07-28 DIAGNOSIS — Z888 Allergy status to other drugs, medicaments and biological substances status: Secondary | ICD-10-CM | POA: Diagnosis not present

## 2015-07-28 DIAGNOSIS — H3530 Unspecified macular degeneration: Secondary | ICD-10-CM | POA: Diagnosis not present

## 2015-07-28 DIAGNOSIS — Z9049 Acquired absence of other specified parts of digestive tract: Secondary | ICD-10-CM | POA: Diagnosis not present

## 2015-07-28 DIAGNOSIS — Z9889 Other specified postprocedural states: Secondary | ICD-10-CM | POA: Diagnosis not present

## 2015-07-28 DIAGNOSIS — R093 Abnormal sputum: Secondary | ICD-10-CM | POA: Diagnosis not present

## 2015-07-28 DIAGNOSIS — J449 Chronic obstructive pulmonary disease, unspecified: Secondary | ICD-10-CM | POA: Diagnosis not present

## 2015-07-28 DIAGNOSIS — H538 Other visual disturbances: Secondary | ICD-10-CM | POA: Diagnosis not present

## 2015-07-28 DIAGNOSIS — Z881 Allergy status to other antibiotic agents status: Secondary | ICD-10-CM | POA: Diagnosis not present

## 2015-07-28 DIAGNOSIS — G4733 Obstructive sleep apnea (adult) (pediatric): Secondary | ICD-10-CM | POA: Diagnosis not present

## 2015-07-28 DIAGNOSIS — Z90710 Acquired absence of both cervix and uterus: Secondary | ICD-10-CM | POA: Diagnosis not present

## 2015-07-28 DIAGNOSIS — H3531 Nonexudative age-related macular degeneration: Secondary | ICD-10-CM | POA: Diagnosis not present

## 2015-07-28 DIAGNOSIS — Z7951 Long term (current) use of inhaled steroids: Secondary | ICD-10-CM | POA: Diagnosis not present

## 2015-07-28 DIAGNOSIS — Z833 Family history of diabetes mellitus: Secondary | ICD-10-CM | POA: Diagnosis not present

## 2015-07-28 DIAGNOSIS — R51 Headache: Secondary | ICD-10-CM | POA: Diagnosis not present

## 2015-07-28 DIAGNOSIS — M2550 Pain in unspecified joint: Secondary | ICD-10-CM | POA: Diagnosis not present

## 2015-07-28 DIAGNOSIS — Z882 Allergy status to sulfonamides status: Secondary | ICD-10-CM | POA: Diagnosis not present

## 2015-07-30 DIAGNOSIS — Z6841 Body Mass Index (BMI) 40.0 and over, adult: Secondary | ICD-10-CM | POA: Diagnosis not present

## 2015-07-30 DIAGNOSIS — G4733 Obstructive sleep apnea (adult) (pediatric): Secondary | ICD-10-CM | POA: Diagnosis not present

## 2015-07-30 DIAGNOSIS — J45909 Unspecified asthma, uncomplicated: Secondary | ICD-10-CM | POA: Diagnosis not present

## 2015-08-29 DIAGNOSIS — Z86711 Personal history of pulmonary embolism: Secondary | ICD-10-CM | POA: Diagnosis not present

## 2015-08-29 DIAGNOSIS — Z882 Allergy status to sulfonamides status: Secondary | ICD-10-CM | POA: Diagnosis not present

## 2015-08-29 DIAGNOSIS — E669 Obesity, unspecified: Secondary | ICD-10-CM | POA: Diagnosis not present

## 2015-08-29 DIAGNOSIS — G2581 Restless legs syndrome: Secondary | ICD-10-CM | POA: Diagnosis not present

## 2015-08-29 DIAGNOSIS — E538 Deficiency of other specified B group vitamins: Secondary | ICD-10-CM | POA: Diagnosis not present

## 2015-08-29 DIAGNOSIS — E1140 Type 2 diabetes mellitus with diabetic neuropathy, unspecified: Secondary | ICD-10-CM | POA: Diagnosis not present

## 2015-08-29 DIAGNOSIS — Z880 Allergy status to penicillin: Secondary | ICD-10-CM | POA: Diagnosis not present

## 2015-08-29 DIAGNOSIS — R609 Edema, unspecified: Secondary | ICD-10-CM | POA: Diagnosis not present

## 2015-08-29 DIAGNOSIS — M1990 Unspecified osteoarthritis, unspecified site: Secondary | ICD-10-CM | POA: Diagnosis not present

## 2015-08-29 DIAGNOSIS — F329 Major depressive disorder, single episode, unspecified: Secondary | ICD-10-CM | POA: Diagnosis not present

## 2015-08-29 DIAGNOSIS — Z7951 Long term (current) use of inhaled steroids: Secondary | ICD-10-CM | POA: Diagnosis not present

## 2015-08-29 DIAGNOSIS — Z6841 Body Mass Index (BMI) 40.0 and over, adult: Secondary | ICD-10-CM | POA: Diagnosis not present

## 2015-08-29 DIAGNOSIS — I10 Essential (primary) hypertension: Secondary | ICD-10-CM | POA: Diagnosis not present

## 2015-08-29 DIAGNOSIS — F419 Anxiety disorder, unspecified: Secondary | ICD-10-CM | POA: Diagnosis not present

## 2015-08-29 DIAGNOSIS — K219 Gastro-esophageal reflux disease without esophagitis: Secondary | ICD-10-CM | POA: Diagnosis not present

## 2015-08-29 DIAGNOSIS — M7989 Other specified soft tissue disorders: Secondary | ICD-10-CM | POA: Diagnosis not present

## 2015-08-29 DIAGNOSIS — R600 Localized edema: Secondary | ICD-10-CM | POA: Diagnosis not present

## 2015-08-29 DIAGNOSIS — J449 Chronic obstructive pulmonary disease, unspecified: Secondary | ICD-10-CM | POA: Diagnosis not present

## 2015-08-29 DIAGNOSIS — E1121 Type 2 diabetes mellitus with diabetic nephropathy: Secondary | ICD-10-CM | POA: Diagnosis not present

## 2015-08-29 DIAGNOSIS — J45909 Unspecified asthma, uncomplicated: Secondary | ICD-10-CM | POA: Diagnosis not present

## 2015-08-29 DIAGNOSIS — G4733 Obstructive sleep apnea (adult) (pediatric): Secondary | ICD-10-CM | POA: Diagnosis not present

## 2015-08-29 DIAGNOSIS — Z888 Allergy status to other drugs, medicaments and biological substances status: Secondary | ICD-10-CM | POA: Diagnosis not present

## 2015-08-29 DIAGNOSIS — D509 Iron deficiency anemia, unspecified: Secondary | ICD-10-CM | POA: Diagnosis not present

## 2015-08-29 DIAGNOSIS — Z79899 Other long term (current) drug therapy: Secondary | ICD-10-CM | POA: Diagnosis not present

## 2015-08-30 DIAGNOSIS — Z23 Encounter for immunization: Secondary | ICD-10-CM | POA: Diagnosis not present

## 2015-09-17 DIAGNOSIS — M25531 Pain in right wrist: Secondary | ICD-10-CM | POA: Diagnosis not present

## 2015-09-17 DIAGNOSIS — I10 Essential (primary) hypertension: Secondary | ICD-10-CM | POA: Diagnosis not present

## 2015-09-17 DIAGNOSIS — N183 Chronic kidney disease, stage 3 (moderate): Secondary | ICD-10-CM | POA: Diagnosis not present

## 2015-09-17 DIAGNOSIS — E1122 Type 2 diabetes mellitus with diabetic chronic kidney disease: Secondary | ICD-10-CM | POA: Diagnosis not present

## 2015-09-19 DIAGNOSIS — E1122 Type 2 diabetes mellitus with diabetic chronic kidney disease: Secondary | ICD-10-CM | POA: Diagnosis not present

## 2015-09-19 DIAGNOSIS — F419 Anxiety disorder, unspecified: Secondary | ICD-10-CM | POA: Diagnosis not present

## 2015-09-19 DIAGNOSIS — E785 Hyperlipidemia, unspecified: Secondary | ICD-10-CM | POA: Diagnosis not present

## 2015-09-19 DIAGNOSIS — N183 Chronic kidney disease, stage 3 (moderate): Secondary | ICD-10-CM | POA: Diagnosis not present

## 2015-09-19 DIAGNOSIS — J309 Allergic rhinitis, unspecified: Secondary | ICD-10-CM | POA: Diagnosis not present

## 2015-09-22 DIAGNOSIS — J449 Chronic obstructive pulmonary disease, unspecified: Secondary | ICD-10-CM | POA: Diagnosis not present

## 2015-09-22 DIAGNOSIS — R438 Other disturbances of smell and taste: Secondary | ICD-10-CM | POA: Diagnosis not present

## 2015-09-22 DIAGNOSIS — E871 Hypo-osmolality and hyponatremia: Secondary | ICD-10-CM | POA: Diagnosis not present

## 2015-09-22 DIAGNOSIS — R51 Headache: Secondary | ICD-10-CM | POA: Diagnosis not present

## 2015-09-22 DIAGNOSIS — I10 Essential (primary) hypertension: Secondary | ICD-10-CM | POA: Diagnosis not present

## 2015-09-22 DIAGNOSIS — R0981 Nasal congestion: Secondary | ICD-10-CM | POA: Diagnosis not present

## 2015-09-22 DIAGNOSIS — Z8709 Personal history of other diseases of the respiratory system: Secondary | ICD-10-CM | POA: Diagnosis not present

## 2015-09-22 DIAGNOSIS — K219 Gastro-esophageal reflux disease without esophagitis: Secondary | ICD-10-CM | POA: Diagnosis not present

## 2015-09-22 DIAGNOSIS — J45909 Unspecified asthma, uncomplicated: Secondary | ICD-10-CM | POA: Diagnosis not present

## 2015-09-22 DIAGNOSIS — E119 Type 2 diabetes mellitus without complications: Secondary | ICD-10-CM | POA: Diagnosis not present

## 2015-09-22 DIAGNOSIS — Z7982 Long term (current) use of aspirin: Secondary | ICD-10-CM | POA: Diagnosis not present

## 2015-09-22 DIAGNOSIS — G4733 Obstructive sleep apnea (adult) (pediatric): Secondary | ICD-10-CM | POA: Diagnosis not present

## 2015-09-30 DIAGNOSIS — M75111 Incomplete rotator cuff tear or rupture of right shoulder, not specified as traumatic: Secondary | ICD-10-CM | POA: Diagnosis not present

## 2015-09-30 DIAGNOSIS — M66811 Spontaneous rupture of other tendons, right shoulder: Secondary | ICD-10-CM | POA: Diagnosis not present

## 2015-10-07 DIAGNOSIS — N183 Chronic kidney disease, stage 3 (moderate): Secondary | ICD-10-CM | POA: Diagnosis not present

## 2015-10-08 DIAGNOSIS — M7711 Lateral epicondylitis, right elbow: Secondary | ICD-10-CM | POA: Diagnosis not present

## 2015-10-08 DIAGNOSIS — M7541 Impingement syndrome of right shoulder: Secondary | ICD-10-CM | POA: Diagnosis not present

## 2015-11-08 DIAGNOSIS — H6012 Cellulitis of left external ear: Secondary | ICD-10-CM | POA: Diagnosis not present

## 2015-11-17 DIAGNOSIS — G4733 Obstructive sleep apnea (adult) (pediatric): Secondary | ICD-10-CM | POA: Diagnosis not present

## 2015-11-17 DIAGNOSIS — Z6841 Body Mass Index (BMI) 40.0 and over, adult: Secondary | ICD-10-CM | POA: Diagnosis not present

## 2015-11-17 DIAGNOSIS — J45909 Unspecified asthma, uncomplicated: Secondary | ICD-10-CM | POA: Diagnosis not present

## 2015-11-18 DIAGNOSIS — G4733 Obstructive sleep apnea (adult) (pediatric): Secondary | ICD-10-CM | POA: Diagnosis not present

## 2015-12-28 DIAGNOSIS — R05 Cough: Secondary | ICD-10-CM | POA: Diagnosis not present

## 2015-12-28 DIAGNOSIS — J209 Acute bronchitis, unspecified: Secondary | ICD-10-CM | POA: Diagnosis not present

## 2015-12-28 DIAGNOSIS — J4521 Mild intermittent asthma with (acute) exacerbation: Secondary | ICD-10-CM | POA: Diagnosis not present

## 2016-01-09 DIAGNOSIS — Z794 Long term (current) use of insulin: Secondary | ICD-10-CM | POA: Diagnosis not present

## 2016-01-09 DIAGNOSIS — E1121 Type 2 diabetes mellitus with diabetic nephropathy: Secondary | ICD-10-CM | POA: Diagnosis not present

## 2016-01-09 DIAGNOSIS — E785 Hyperlipidemia, unspecified: Secondary | ICD-10-CM | POA: Diagnosis not present

## 2016-01-09 DIAGNOSIS — E1122 Type 2 diabetes mellitus with diabetic chronic kidney disease: Secondary | ICD-10-CM | POA: Diagnosis not present

## 2016-01-09 DIAGNOSIS — E1140 Type 2 diabetes mellitus with diabetic neuropathy, unspecified: Secondary | ICD-10-CM | POA: Diagnosis not present

## 2016-01-09 DIAGNOSIS — M4850XA Collapsed vertebra, not elsewhere classified, site unspecified, initial encounter for fracture: Secondary | ICD-10-CM | POA: Diagnosis not present

## 2016-01-09 DIAGNOSIS — I10 Essential (primary) hypertension: Secondary | ICD-10-CM | POA: Diagnosis not present

## 2016-01-09 DIAGNOSIS — E538 Deficiency of other specified B group vitamins: Secondary | ICD-10-CM | POA: Diagnosis not present

## 2016-01-09 DIAGNOSIS — N183 Chronic kidney disease, stage 3 (moderate): Secondary | ICD-10-CM | POA: Diagnosis not present

## 2016-01-09 DIAGNOSIS — K760 Fatty (change of) liver, not elsewhere classified: Secondary | ICD-10-CM | POA: Diagnosis not present

## 2016-01-09 DIAGNOSIS — K219 Gastro-esophageal reflux disease without esophagitis: Secondary | ICD-10-CM | POA: Diagnosis not present

## 2016-01-12 DIAGNOSIS — F419 Anxiety disorder, unspecified: Secondary | ICD-10-CM | POA: Diagnosis not present

## 2016-01-12 DIAGNOSIS — I10 Essential (primary) hypertension: Secondary | ICD-10-CM | POA: Diagnosis not present

## 2016-01-12 DIAGNOSIS — E785 Hyperlipidemia, unspecified: Secondary | ICD-10-CM | POA: Diagnosis not present

## 2016-01-12 DIAGNOSIS — R05 Cough: Secondary | ICD-10-CM | POA: Diagnosis not present

## 2016-01-12 DIAGNOSIS — E1121 Type 2 diabetes mellitus with diabetic nephropathy: Secondary | ICD-10-CM | POA: Diagnosis not present

## 2016-01-12 DIAGNOSIS — Z794 Long term (current) use of insulin: Secondary | ICD-10-CM | POA: Diagnosis not present

## 2016-01-12 DIAGNOSIS — R0981 Nasal congestion: Secondary | ICD-10-CM | POA: Diagnosis not present

## 2016-01-19 DIAGNOSIS — I10 Essential (primary) hypertension: Secondary | ICD-10-CM | POA: Diagnosis not present

## 2016-01-19 DIAGNOSIS — R0602 Shortness of breath: Secondary | ICD-10-CM | POA: Diagnosis not present

## 2016-01-19 DIAGNOSIS — R0609 Other forms of dyspnea: Secondary | ICD-10-CM | POA: Diagnosis not present

## 2016-01-19 DIAGNOSIS — Z9889 Other specified postprocedural states: Secondary | ICD-10-CM | POA: Diagnosis not present

## 2016-01-19 DIAGNOSIS — Z01810 Encounter for preprocedural cardiovascular examination: Secondary | ICD-10-CM | POA: Diagnosis not present

## 2016-01-19 DIAGNOSIS — J45909 Unspecified asthma, uncomplicated: Secondary | ICD-10-CM | POA: Diagnosis not present

## 2016-01-19 DIAGNOSIS — R05 Cough: Secondary | ICD-10-CM | POA: Diagnosis not present

## 2016-01-19 DIAGNOSIS — Z9181 History of falling: Secondary | ICD-10-CM | POA: Diagnosis not present

## 2016-01-19 DIAGNOSIS — E119 Type 2 diabetes mellitus without complications: Secondary | ICD-10-CM | POA: Diagnosis not present

## 2016-01-30 ENCOUNTER — Other Ambulatory Visit: Payer: Self-pay | Admitting: Orthopaedic Surgery

## 2016-02-12 ENCOUNTER — Other Ambulatory Visit: Payer: Self-pay

## 2016-02-12 ENCOUNTER — Encounter (HOSPITAL_COMMUNITY)
Admission: RE | Admit: 2016-02-12 | Discharge: 2016-02-12 | Disposition: A | Discharge status: Home / Self Care | Payer: Medicare Other | Source: Ambulatory Visit | Attending: Orthopaedic Surgery | Admitting: Orthopaedic Surgery

## 2016-02-12 ENCOUNTER — Encounter (HOSPITAL_COMMUNITY): Payer: Self-pay

## 2016-02-12 DIAGNOSIS — I739 Peripheral vascular disease, unspecified: Secondary | ICD-10-CM | POA: Insufficient documentation

## 2016-02-12 DIAGNOSIS — K219 Gastro-esophageal reflux disease without esophagitis: Secondary | ICD-10-CM | POA: Diagnosis not present

## 2016-02-12 DIAGNOSIS — Z7982 Long term (current) use of aspirin: Secondary | ICD-10-CM | POA: Insufficient documentation

## 2016-02-12 DIAGNOSIS — N183 Chronic kidney disease, stage 3 (moderate): Secondary | ICD-10-CM | POA: Insufficient documentation

## 2016-02-12 DIAGNOSIS — G2581 Restless legs syndrome: Secondary | ICD-10-CM | POA: Diagnosis not present

## 2016-02-12 DIAGNOSIS — Z8249 Family history of ischemic heart disease and other diseases of the circulatory system: Secondary | ICD-10-CM | POA: Diagnosis not present

## 2016-02-12 DIAGNOSIS — I878 Other specified disorders of veins: Secondary | ICD-10-CM | POA: Insufficient documentation

## 2016-02-12 DIAGNOSIS — Z96651 Presence of right artificial knee joint: Secondary | ICD-10-CM | POA: Insufficient documentation

## 2016-02-12 DIAGNOSIS — Z86718 Personal history of other venous thrombosis and embolism: Secondary | ICD-10-CM | POA: Diagnosis not present

## 2016-02-12 DIAGNOSIS — Z79899 Other long term (current) drug therapy: Secondary | ICD-10-CM | POA: Diagnosis not present

## 2016-02-12 DIAGNOSIS — Z01818 Encounter for other preprocedural examination: Secondary | ICD-10-CM | POA: Insufficient documentation

## 2016-02-12 DIAGNOSIS — Z01812 Encounter for preprocedural laboratory examination: Secondary | ICD-10-CM | POA: Diagnosis not present

## 2016-02-12 DIAGNOSIS — Z86711 Personal history of pulmonary embolism: Secondary | ICD-10-CM | POA: Insufficient documentation

## 2016-02-12 DIAGNOSIS — Z7984 Long term (current) use of oral hypoglycemic drugs: Secondary | ICD-10-CM | POA: Diagnosis not present

## 2016-02-12 DIAGNOSIS — E1122 Type 2 diabetes mellitus with diabetic chronic kidney disease: Secondary | ICD-10-CM | POA: Insufficient documentation

## 2016-02-12 DIAGNOSIS — G4733 Obstructive sleep apnea (adult) (pediatric): Secondary | ICD-10-CM | POA: Insufficient documentation

## 2016-02-12 DIAGNOSIS — M7711 Lateral epicondylitis, right elbow: Secondary | ICD-10-CM | POA: Insufficient documentation

## 2016-02-12 DIAGNOSIS — K760 Fatty (change of) liver, not elsewhere classified: Secondary | ICD-10-CM | POA: Insufficient documentation

## 2016-02-12 DIAGNOSIS — M25811 Other specified joint disorders, right shoulder: Secondary | ICD-10-CM | POA: Diagnosis not present

## 2016-02-12 HISTORY — DX: Other specified postprocedural states: Z98.890

## 2016-02-12 HISTORY — DX: Anxiety disorder, unspecified: F41.9

## 2016-02-12 HISTORY — DX: Headache, unspecified: R51.9

## 2016-02-12 HISTORY — DX: Headache: R51

## 2016-02-12 HISTORY — DX: Nausea with vomiting, unspecified: R11.2

## 2016-02-12 LAB — GLUCOSE, CAPILLARY: Glucose-Capillary: 85 mg/dL (ref 65–99)

## 2016-02-12 LAB — BASIC METABOLIC PANEL
Anion gap: 12 (ref 5–15)
BUN: 21 mg/dL — ABNORMAL HIGH (ref 6–20)
CO2: 21 mmol/L — ABNORMAL LOW (ref 22–32)
Calcium: 9.1 mg/dL (ref 8.9–10.3)
Chloride: 106 mmol/L (ref 101–111)
Creatinine, Ser: 1.52 mg/dL — ABNORMAL HIGH (ref 0.44–1.00)
GFR calc Af Amer: 41 mL/min — ABNORMAL LOW (ref >60)
GFR calc non Af Amer: 35 mL/min — ABNORMAL LOW (ref >60)
Glucose, Bld: 102 mg/dL — ABNORMAL HIGH (ref 65–99)
Potassium: 4.1 mmol/L (ref 3.5–5.1)
Sodium: 139 mmol/L (ref 135–145)

## 2016-02-12 LAB — CBC
HCT: 34 % — ABNORMAL LOW (ref 36.0–46.0)
Hemoglobin: 11 g/dL — ABNORMAL LOW (ref 12.0–15.0)
MCH: 30.5 pg (ref 26.0–34.0)
MCHC: 32.4 g/dL (ref 30.0–36.0)
MCV: 94.2 fL (ref 78.0–100.0)
Platelets: 162 10*3/uL (ref 150–400)
RBC: 3.61 MIL/uL — ABNORMAL LOW (ref 3.87–5.11)
RDW: 15.5 % (ref 11.5–15.5)
WBC: 9.2 10*3/uL (ref 4.0–10.5)

## 2016-02-12 LAB — SURGICAL PCR SCREEN
MRSA, PCR: NEGATIVE
Staphylococcus aureus: NEGATIVE

## 2016-02-12 NOTE — Pre-Procedure Instructions (Signed)
[name]  02/12/2016      [facility] [address], [address] [place] [address] Phone: [phone] Fax: [phone]    Your procedure is scheduled on Tuesday, February 28th, 2017.  Report to [facility] Admitting at 11:00 A.M.  Call this number if you have problems the morning of surgery:  [phone]   Remember:  Do not eat food or drink liquids after midnight.   Take these medicines the morning of surgery with A SIP OF WATER: Albuterol inhaler if needed (please bring with you), Budesonide-formoterol inhaler (Symbicort), Carbidopa-Levodopa (Sinemet) if needed, Citalopram (Celexa), Omeprazole (Prilosec).   What do I do about my diabetes medications?   Do not take oral diabetes medicines (pills) the morning of surgery.  Do not take Metformin and Glipizide the morning of surgery.    7 days prior to surgery, stop taking: Aspirin, NSAIDS, Aleve, Naproxen, Ibuprofen, Advil, Motrin, BC's, Goody's, Fish oil, all herbal medications, and all vitamins.    Do not wear jewelry, make-up or nail polish.  Do not wear lotions, powders, or perfumes.   Do not shave 48 hours prior to surgery.    Do not bring valuables to the hospital.  [facility] is not responsible for any belongings or valuables.  Contacts, dentures or bridgework may not be worn into surgery.  Leave your suitcase in the car.  After surgery it may be brought to your room.  For patients admitted to the hospital, discharge time will be determined by your treatment team.  Patients discharged the day of surgery will not be allowed to drive home.   Special instructions:  See attached.   Please read over the following fact sheets that you were given. Pain Booklet, Coughing and Deep Breathing and Surgical Site Infection Prevention    How to Manage Your Diabetes Before Surgery   Why is it important to control my blood sugar before  and after surgery?   Improving blood sugar levels before and after surgery helps healing and can limit problems.  A way of improving blood sugar control is eating a healthy diet by:  - Eating less sugar and carbohydrates  - Increasing activity/exercise  - Talk with your doctor about reaching your blood sugar goals  High blood sugars (greater than 180 mg/dL) can raise your risk of infections and slow down your recovery so you will need to focus on controlling your diabetes during the weeks before surgery.  Make sure that the doctor who takes care of your diabetes knows about your planned surgery including the date and location.  How do I manage my blood sugars before surgery?   Check your blood sugar at least 4 times a day, 2 days before surgery to make sure that they are not too high or low.   Check your blood sugar the morning of your surgery when you wake up and every 2 hours until you get to the Short-Stay unit.  If your blood sugar is less than 70 mg/dL, you will need to treat for low blood sugar by:  Treat a low blood sugar (less than 70 mg/dL) with 1/2 cup of clear juice (cranberry or apple), 4 glucose tablets, OR glucose gel.  Recheck blood sugar in 15 minutes after treatment (to make sure it is greater than 70 mg/dL).  If blood sugar is not greater than 70 mg/dL on re-check, call [phone] for further instructions.   Report your  blood sugar to the Short-Stay nurse when you get to Short-Stay.  References:  [facility], 2007 "How to Manage your Diabetes Before and After Surgery".

## 2016-02-12 NOTE — Progress Notes (Signed)
PCP - Dr. Juanda Crumble Record at [place] Cardiologist - Dr. Barbaraann Boys Neurologist - Dr. Dimas Millin GI - Dr. Tora Duck ENT - Dr. Vicente Masson Nephrologist - Dr. Antionette Fairy Pulmonologist - Dr. Velta Addison  EKG - 02/12/16 CXR - denies  Echo/stress test - denies Cardiac cath - 2014  Patient denies chest pain and shortness of breat at PAT appointment.  Patient's blood sugar was 85 at pre admission appoint and patient stated that she has not had anything to eat today.  Nurse gave patient a coke during appointment.   Patient states she checks her blood sugar 2-3 times a day and that her fasting glucose is in the 120's.   Patient's recent A1C : 5.0.

## 2016-02-13 DIAGNOSIS — Z1231 Encounter for screening mammogram for malignant neoplasm of breast: Secondary | ICD-10-CM | POA: Diagnosis not present

## 2016-02-13 LAB — HM MAMMOGRAPHY

## 2016-02-13 NOTE — Progress Notes (Signed)
Anesthesia Chart Review:  Pt is a 65 year old female scheduled for R shoulder arthroscopy and R elbow lateral repair on 02/24/2016 with Dr. Rhona Raider.   History includes non-smoker, DM2, CKD (stage III), family history of CAD (patient with normal coronaries by 2014 cath), PVD, OSA (no CPAP), GERD, fatty liver, right TKA 2001, RLE DVT with PE 2001, RLS, venous stasis, SOB, arthritis, hysterectomy, panniculectomy 08/27/14. BMI 58. S/p bunionectomy 01/28/15.   PCP is listed as Dr. Juanda Crumble Record in [place]. Pulmonologist is Dr. Michela Pitcher (Care Everywhere). Last cardiology visit was 03/2014 with Dr. Marykay Lex with [facility] (Care Everywhere) with PRN follow-up recommended.  Meds include albuterol, Xanax, ASA, Symbicort, Sinemet, Celexa, Flonase, Lasix, glucotrol, Singulair, metformin, Prilosec, KCl, pravastatin, spirolactone, Topamax.  EKG 02/12/16: NSR. Low voltage QRS. Nonspecific ST abnormality  Cardiac cath on 11/27/13 (Care Everywhere): Widely patent coronaries - no obstructive lesions. LVH with EF 60%.  Echo on 03/16/11 (Care Everywhere):  Technically difficult study Normal left ventricular size Preserved left ventricular systolic function Unable to fully assess regional wall motion No significant valve stenosis No significant valvular regurgitation No pericardial effusion No old study for comparison  Preoperative labs noted. Glucose 102. HgbA1c was 5.0 on 01/09/16 (care everywhere).   If no changes, I anticipate pt can proceed with surgery as scheduled.   Willeen Cass, FNP-BC [facility] Short Stay Surgical Center/Anesthesiology Phone: [phone] 02/13/2016 12:04 PM

## 2016-02-20 DIAGNOSIS — G4733 Obstructive sleep apnea (adult) (pediatric): Secondary | ICD-10-CM | POA: Diagnosis not present

## 2016-02-23 DIAGNOSIS — G4733 Obstructive sleep apnea (adult) (pediatric): Secondary | ICD-10-CM | POA: Diagnosis not present

## 2016-02-23 DIAGNOSIS — R05 Cough: Secondary | ICD-10-CM | POA: Diagnosis not present

## 2016-02-23 MED ORDER — VANCOMYCIN HCL 10 G IV SOLR
1500.0000 mg | INTRAVENOUS | Status: DC
  Filled 2016-02-23: qty 1500

## 2016-02-23 MED ORDER — CHLORHEXIDINE GLUCONATE 4 % EX LIQD: 60.0000 mL | Freq: Once | CUTANEOUS | Status: DC

## 2016-02-23 MED ORDER — VANCOMYCIN HCL 10 G IV SOLR
1500.0000 mg | INTRAVENOUS | Status: AC
  Administered 2016-02-24: 1500 mg via INTRAVENOUS
  Filled 2016-02-23: qty 1500

## 2016-02-23 MED ORDER — LACTATED RINGERS IV SOLN: INTRAVENOUS | Status: DC

## 2016-02-23 NOTE — Progress Notes (Signed)
Patient notified of time change and verbalized understanding.  Patient stated that she would arrive to short stay at 1200 day of surgery.

## 2016-02-24 ENCOUNTER — Encounter (HOSPITAL_COMMUNITY)
Admission: RE | Disposition: A | Discharge status: Home / Self Care | Payer: Self-pay | Source: Ambulatory Visit | Attending: Orthopaedic Surgery

## 2016-02-24 ENCOUNTER — Ambulatory Visit (HOSPITAL_COMMUNITY): Payer: Medicare Other | Admitting: Certified Registered Nurse Anesthetist

## 2016-02-24 ENCOUNTER — Encounter (HOSPITAL_COMMUNITY): Payer: Self-pay | Admitting: Certified Registered Nurse Anesthetist

## 2016-02-24 ENCOUNTER — Ambulatory Visit (HOSPITAL_COMMUNITY)
Admission: RE | Admit: 2016-02-24 | Discharge: 2016-02-24 | Disposition: A | Discharge status: Home / Self Care | Payer: Medicare Other | Source: Ambulatory Visit | Attending: Orthopaedic Surgery | Admitting: Orthopaedic Surgery

## 2016-02-24 ENCOUNTER — Ambulatory Visit (HOSPITAL_COMMUNITY): Payer: Medicare Other | Admitting: Emergency Medicine

## 2016-02-24 DIAGNOSIS — M19011 Primary osteoarthritis, right shoulder: Secondary | ICD-10-CM | POA: Diagnosis not present

## 2016-02-24 DIAGNOSIS — Z86711 Personal history of pulmonary embolism: Secondary | ICD-10-CM | POA: Diagnosis not present

## 2016-02-24 DIAGNOSIS — Z8249 Family history of ischemic heart disease and other diseases of the circulatory system: Secondary | ICD-10-CM | POA: Diagnosis not present

## 2016-02-24 DIAGNOSIS — M7541 Impingement syndrome of right shoulder: Secondary | ICD-10-CM | POA: Insufficient documentation

## 2016-02-24 DIAGNOSIS — M7711 Lateral epicondylitis, right elbow: Secondary | ICD-10-CM | POA: Diagnosis not present

## 2016-02-24 DIAGNOSIS — Z880 Allergy status to penicillin: Secondary | ICD-10-CM | POA: Diagnosis not present

## 2016-02-24 DIAGNOSIS — Z96651 Presence of right artificial knee joint: Secondary | ICD-10-CM | POA: Diagnosis not present

## 2016-02-24 DIAGNOSIS — G8918 Other acute postprocedural pain: Secondary | ICD-10-CM | POA: Diagnosis not present

## 2016-02-24 DIAGNOSIS — E1122 Type 2 diabetes mellitus with diabetic chronic kidney disease: Secondary | ICD-10-CM | POA: Insufficient documentation

## 2016-02-24 DIAGNOSIS — N189 Chronic kidney disease, unspecified: Secondary | ICD-10-CM | POA: Insufficient documentation

## 2016-02-24 HISTORY — PX: SHOULDER ARTHROSCOPY: SHX128

## 2016-02-24 HISTORY — PX: TENNIS ELBOW RELEASE/NIRSCHEL PROCEDURE: SHX6651

## 2016-02-24 LAB — GLUCOSE, CAPILLARY: Glucose-Capillary: 106 mg/dL — ABNORMAL HIGH (ref 65–99)

## 2016-02-24 SURGERY — ARTHROSCOPY, SHOULDER
Anesthesia: Regional | Site: Shoulder | Laterality: Right

## 2016-02-24 MED ORDER — EPHEDRINE SULFATE 50 MG/ML IJ SOLN
INTRAMUSCULAR | Status: AC
  Filled 2016-02-24: qty 1

## 2016-02-24 MED ORDER — EPHEDRINE SULFATE 50 MG/ML IJ SOLN
INTRAMUSCULAR | Status: DC | PRN
  Administered 2016-02-24 (×3): 10 mg via INTRAVENOUS

## 2016-02-24 MED ORDER — PHENYLEPHRINE HCL 10 MG/ML IJ SOLN
10.0000 mg | INTRAVENOUS | Status: DC | PRN
  Administered 2016-02-24: 50 ug/min via INTRAVENOUS

## 2016-02-24 MED ORDER — OXYCODONE-ACETAMINOPHEN 5-325 MG PO TABS
1.0000 | ORAL_TABLET | Freq: Once | ORAL | Status: AC
  Administered 2016-02-24: 1 via ORAL

## 2016-02-24 MED ORDER — NEOSTIGMINE METHYLSULFATE 10 MG/10ML IV SOLN
INTRAVENOUS | Status: DC | PRN
  Administered 2016-02-24: 2 mg via INTRAVENOUS

## 2016-02-24 MED ORDER — ONDANSETRON HCL 4 MG/2ML IJ SOLN
INTRAMUSCULAR | Status: AC
  Filled 2016-02-24: qty 2

## 2016-02-24 MED ORDER — OXYCODONE-ACETAMINOPHEN 5-325 MG PO TABS
ORAL_TABLET | ORAL | Status: AC
  Administered 2016-02-24: 1 via ORAL
  Filled 2016-02-24: qty 1

## 2016-02-24 MED ORDER — OXYCODONE-ACETAMINOPHEN 5-325 MG PO TABS: 1.0000 | ORAL_TABLET | ORAL | Status: DC | PRN

## 2016-02-24 MED ORDER — MIDAZOLAM HCL 2 MG/2ML IJ SOLN
INTRAMUSCULAR | Status: AC
  Administered 2016-02-24: 2 mg
  Filled 2016-02-24: qty 2

## 2016-02-24 MED ORDER — MEPERIDINE HCL 25 MG/ML IJ SOLN: 6.2500 mg | INTRAMUSCULAR | Status: DC | PRN

## 2016-02-24 MED ORDER — BUPIVACAINE-EPINEPHRINE (PF) 0.5% -1:200000 IJ SOLN
INTRAMUSCULAR | Status: DC | PRN
  Administered 2016-02-24: 25 mL via PERINEURAL

## 2016-02-24 MED ORDER — LIDOCAINE HCL (CARDIAC) 20 MG/ML IV SOLN
INTRAVENOUS | Status: AC
  Filled 2016-02-24: qty 5

## 2016-02-24 MED ORDER — MIDAZOLAM HCL 2 MG/2ML IJ SOLN
INTRAMUSCULAR | Status: AC
  Filled 2016-02-24: qty 2

## 2016-02-24 MED ORDER — SODIUM CHLORIDE 0.9 % IR SOLN
Status: DC | PRN
  Administered 2016-02-24 (×2): 3000 mL

## 2016-02-24 MED ORDER — GLYCOPYRROLATE 0.2 MG/ML IJ SOLN
INTRAMUSCULAR | Status: DC | PRN
  Administered 2016-02-24: 0.2 mg via INTRAVENOUS

## 2016-02-24 MED ORDER — FENTANYL CITRATE (PF) 250 MCG/5ML IJ SOLN
INTRAMUSCULAR | Status: AC
  Filled 2016-02-24: qty 5

## 2016-02-24 MED ORDER — BUPIVACAINE HCL (PF) 0.25 % IJ SOLN
INTRAMUSCULAR | Status: DC | PRN
  Administered 2016-02-24: 10 mL

## 2016-02-24 MED ORDER — LACTATED RINGERS IV SOLN: INTRAVENOUS | Status: DC | PRN

## 2016-02-24 MED ORDER — MIDAZOLAM HCL 5 MG/5ML IJ SOLN
INTRAMUSCULAR | Status: DC | PRN
  Administered 2016-02-24 (×2): 1 mg via INTRAVENOUS

## 2016-02-24 MED ORDER — ONDANSETRON HCL 4 MG/2ML IJ SOLN
INTRAMUSCULAR | Status: DC | PRN
  Administered 2016-02-24: 4 mg via INTRAVENOUS

## 2016-02-24 MED ORDER — METOCLOPRAMIDE HCL 5 MG/ML IJ SOLN: 10.0000 mg | Freq: Once | INTRAMUSCULAR | Status: DC | PRN

## 2016-02-24 MED ORDER — FENTANYL CITRATE (PF) 100 MCG/2ML IJ SOLN
INTRAMUSCULAR | Status: AC
  Administered 2016-02-24: 50 ug
  Filled 2016-02-24: qty 2

## 2016-02-24 MED ORDER — SUCCINYLCHOLINE CHLORIDE 20 MG/ML IJ SOLN
INTRAMUSCULAR | Status: DC | PRN
  Administered 2016-02-24: 100 mg via INTRAVENOUS

## 2016-02-24 MED ORDER — PHENYLEPHRINE HCL 10 MG/ML IJ SOLN
INTRAMUSCULAR | Status: DC | PRN
  Administered 2016-02-24 (×2): 80 ug via INTRAVENOUS
  Administered 2016-02-24 (×4): 120 ug via INTRAVENOUS
  Administered 2016-02-24: 80 ug via INTRAVENOUS
  Administered 2016-02-24 (×2): 120 ug via INTRAVENOUS

## 2016-02-24 MED ORDER — LIDOCAINE HCL (CARDIAC) 20 MG/ML IV SOLN
INTRAVENOUS | Status: DC | PRN
  Administered 2016-02-24: 100 mg via INTRAVENOUS

## 2016-02-24 MED ORDER — PROPOFOL 10 MG/ML IV BOLUS
INTRAVENOUS | Status: AC
  Filled 2016-02-24: qty 20

## 2016-02-24 MED ORDER — BUPIVACAINE HCL (PF) 0.25 % IJ SOLN
INTRAMUSCULAR | Status: AC
  Filled 2016-02-24: qty 30

## 2016-02-24 MED ORDER — FENTANYL CITRATE (PF) 100 MCG/2ML IJ SOLN
INTRAMUSCULAR | Status: DC | PRN
  Administered 2016-02-24: 50 ug via INTRAVENOUS

## 2016-02-24 MED ORDER — SODIUM CHLORIDE 0.9 % IV SOLN
INTRAVENOUS | Status: DC
  Administered 2016-02-24: 10 mL/h via INTRAVENOUS
  Administered 2016-02-24: 13:00:00 via INTRAVENOUS

## 2016-02-24 MED ORDER — SUCCINYLCHOLINE CHLORIDE 20 MG/ML IJ SOLN
INTRAMUSCULAR | Status: AC
  Filled 2016-02-24: qty 1

## 2016-02-24 MED ORDER — ROCURONIUM BROMIDE 50 MG/5ML IV SOLN
INTRAVENOUS | Status: AC
  Filled 2016-02-24: qty 1

## 2016-02-24 MED ORDER — ROCURONIUM BROMIDE 100 MG/10ML IV SOLN
INTRAVENOUS | Status: DC | PRN
  Administered 2016-02-24: 20 mg via INTRAVENOUS

## 2016-02-24 MED ORDER — FENTANYL CITRATE (PF) 100 MCG/2ML IJ SOLN: 25.0000 ug | INTRAMUSCULAR | Status: DC | PRN

## 2016-02-24 MED ORDER — PROPOFOL 10 MG/ML IV BOLUS
INTRAVENOUS | Status: DC | PRN
  Administered 2016-02-24: 120 mg via INTRAVENOUS

## 2016-02-24 SURGICAL SUPPLY — 87 items
APL SKNCLS STERI-STRIP NONHPOA (GAUZE/BANDAGES/DRESSINGS) ×2
BANDAGE ELASTIC 3 VELCRO ST LF (GAUZE/BANDAGES/DRESSINGS) ×3 IMPLANT
BANDAGE ELASTIC 4 VELCRO ST LF (GAUZE/BANDAGES/DRESSINGS) ×3 IMPLANT
BENZOIN TINCTURE PRP APPL 2/3 (GAUZE/BANDAGES/DRESSINGS) ×3 IMPLANT
BLADE GREAT WHITE 4.2 (BLADE) ×3 IMPLANT
BNDG CMPR 9X4 STRL LF SNTH (GAUZE/BANDAGES/DRESSINGS)
BNDG COHESIVE 4X5 TAN STRL (GAUZE/BANDAGES/DRESSINGS) ×1 IMPLANT
BNDG ESMARK 4X9 LF (GAUZE/BANDAGES/DRESSINGS) IMPLANT
BNDG GAUZE ELAST 4 BULKY (GAUZE/BANDAGES/DRESSINGS) ×3 IMPLANT
BUR VERTEX HOODED 4.5 (BURR) ×3 IMPLANT
CANISTER SUCTION 2500CC (MISCELLANEOUS) ×3 IMPLANT
CANNULA SHOULDER 7CM (CANNULA) ×3 IMPLANT
CANNULA TWIST IN 8.25X7CM (CANNULA) IMPLANT
CLSR STERI-STRIP ANTIMIC 1/2X4 (GAUZE/BANDAGES/DRESSINGS) ×1 IMPLANT
COVER SURGICAL LIGHT HANDLE (MISCELLANEOUS) ×3 IMPLANT
DRAIN PENROSE 1/4X12 LTX STRL (WOUND CARE) IMPLANT
DRAPE IMP U-DRAPE 54X76 (DRAPES) ×3 IMPLANT
DRAPE ORTHO SPLIT 77X108 STRL (DRAPES) ×6
DRAPE STERI 35X30 U-POUCH (DRAPES) ×3 IMPLANT
DRAPE SURG ORHT 6 SPLT 77X108 (DRAPES) ×4 IMPLANT
DRAPE U-SHAPE 47X51 STRL (DRAPES) ×3 IMPLANT
DRSG EMULSION OIL 3X3 NADH (GAUZE/BANDAGES/DRESSINGS) ×3 IMPLANT
DRSG PAD ABDOMINAL 8X10 ST (GAUZE/BANDAGES/DRESSINGS) ×4 IMPLANT
DURAPREP 26ML APPLICATOR (WOUND CARE) ×4 IMPLANT
ELECT MENISCUS 165MM 90D (ELECTRODE) IMPLANT
ELECT REM PT RETURN 9FT ADLT (ELECTROSURGICAL) ×3
ELECTRODE REM PT RTRN 9FT ADLT (ELECTROSURGICAL) ×2 IMPLANT
GAUZE SPONGE 4X4 12PLY STRL (GAUZE/BANDAGES/DRESSINGS) ×3 IMPLANT
GLOVE BIO SURGEON STRL SZ8 (GLOVE) ×12 IMPLANT
GLOVE BIOGEL PI IND STRL 7.5 (GLOVE) IMPLANT
GLOVE BIOGEL PI IND STRL 8 (GLOVE) ×2 IMPLANT
GLOVE BIOGEL PI INDICATOR 7.5 (GLOVE) ×1
GLOVE BIOGEL PI INDICATOR 8 (GLOVE) ×1
GLOVE SURG SS PI 7.5 STRL IVOR (GLOVE) ×2 IMPLANT
GOWN STRL REUS W/ TWL LRG LVL3 (GOWN DISPOSABLE) ×2 IMPLANT
GOWN STRL REUS W/ TWL XL LVL3 (GOWN DISPOSABLE) ×6 IMPLANT
GOWN STRL REUS W/TWL 2XL LVL3 (GOWN DISPOSABLE) ×3 IMPLANT
GOWN STRL REUS W/TWL LRG LVL3 (GOWN DISPOSABLE) ×3
GOWN STRL REUS W/TWL XL LVL3 (GOWN DISPOSABLE) ×9
KIT BASIN OR (CUSTOM PROCEDURE TRAY) ×3 IMPLANT
KIT ROOM TURNOVER OR (KITS) ×3 IMPLANT
LOOP VESSEL MAXI BLUE (MISCELLANEOUS) IMPLANT
MANIFOLD NEPTUNE II (INSTRUMENTS) ×3 IMPLANT
NDL HYPO 25X1 1.5 SAFETY (NEEDLE) ×2 IMPLANT
NDL SCORPION (NEEDLE) IMPLANT
NEEDLE 22X1 1/2 (OR ONLY) (NEEDLE) IMPLANT
NEEDLE HYPO 25X1 1.5 SAFETY (NEEDLE) ×3 IMPLANT
NEEDLE SCORPION (NEEDLE) IMPLANT
NS IRRIG 1000ML POUR BTL (IV SOLUTION) ×3 IMPLANT
PACK ARTHROSCOPY DSU (CUSTOM PROCEDURE TRAY) ×3 IMPLANT
PACK ORTHO EXTREMITY (CUSTOM PROCEDURE TRAY) ×3 IMPLANT
PACK UNIVERSAL I (CUSTOM PROCEDURE TRAY) ×2 IMPLANT
PAD ARMBOARD 7.5X6 YLW CONV (MISCELLANEOUS) ×6 IMPLANT
PAD CAST 4YDX4 CTTN HI CHSV (CAST SUPPLIES) ×2 IMPLANT
PADDING CAST ABS 4INX4YD NS (CAST SUPPLIES) ×1
PADDING CAST ABS COTTON 4X4 ST (CAST SUPPLIES) ×2 IMPLANT
PADDING CAST COTTON 4X4 STRL (CAST SUPPLIES) ×3
PENCIL BUTTON HOLSTER BLD 10FT (ELECTRODE) IMPLANT
SET ARTHROSCOPY TUBING (MISCELLANEOUS) ×3
SET ARTHROSCOPY TUBING LN (MISCELLANEOUS) ×2 IMPLANT
SLING ARM IMMOBILIZER LRG (SOFTGOODS) ×1 IMPLANT
SLING ARM IMMOBILIZER MED (SOFTGOODS) IMPLANT
SLING ARM LRG ADULT FOAM STRAP (SOFTGOODS) ×3 IMPLANT
SPONGE GAUZE 4X4 12PLY STER LF (GAUZE/BANDAGES/DRESSINGS) ×2 IMPLANT
SPONGE LAP 4X18 X RAY DECT (DISPOSABLE) ×3 IMPLANT
STRIP CLOSURE SKIN 1/2X4 (GAUZE/BANDAGES/DRESSINGS) ×3 IMPLANT
SUCTION FRAZIER HANDLE 10FR (MISCELLANEOUS) ×1
SUCTION TUBE FRAZIER 10FR DISP (MISCELLANEOUS) ×2 IMPLANT
SUT BONE WAX W31G (SUTURE) IMPLANT
SUT ETHILON 3 0 PS 1 (SUTURE) ×1 IMPLANT
SUT ETHILON 4 0 PS 2 18 (SUTURE) ×3 IMPLANT
SUT FIBERWIRE #2 38 T-5 BLUE (SUTURE)
SUT PROLENE 4 0 PS 2 18 (SUTURE) IMPLANT
SUT VIC AB 1 CT1 27 (SUTURE)
SUT VIC AB 1 CT1 27XBRD ANBCTR (SUTURE) IMPLANT
SUT VIC AB 2-0 CT1 27 (SUTURE) ×3
SUT VIC AB 2-0 CT1 TAPERPNT 27 (SUTURE) IMPLANT
SUT VIC AB 2-0 SH 27 (SUTURE) ×3
SUT VIC AB 2-0 SH 27XBRD (SUTURE) ×2 IMPLANT
SUTURE FIBERWR #2 38 T-5 BLUE (SUTURE) IMPLANT
SYR CONTROL 10ML LL (SYRINGE) ×3 IMPLANT
TOWEL OR 17X24 6PK STRL BLUE (TOWEL DISPOSABLE) ×3 IMPLANT
TOWEL OR 17X26 10 PK STRL BLUE (TOWEL DISPOSABLE) ×3 IMPLANT
TUBE CONNECTING 12X1/4 (SUCTIONS) ×3 IMPLANT
UNDERPAD 30X30 INCONTINENT (UNDERPADS AND DIAPERS) ×3 IMPLANT
WAND HAND CNTRL MULTIVAC 90 (MISCELLANEOUS) ×3 IMPLANT
WATER STERILE IRR 1000ML POUR (IV SOLUTION) ×3 IMPLANT

## 2016-02-24 NOTE — Op Note (Addendum)
[name], [name]            ACCOUNT NO.:  [id number]  MEDICAL RECORD NO.:  [id number]  LOCATION:  [facility]                         FACILITY:  [facility]  PHYSICIAN:  Monico Blitz. Mahdi Frye, M.D.DATE OF BIRTH:  03-Dec-1951  DATE OF PROCEDURE:  02/24/2016 DATE OF DISCHARGE:  02/24/2016                              OPERATIVE REPORT   PREOPERATIVE DIAGNOSES: 1. Right shoulder impingement. 2. Right shoulder acromioclavicular degeneration. 3. Right elbow lateral epicondylitis.  POSTOPERATIVE DIAGNOSES: 1. Right shoulder impingement. 2. Right shoulder acromioclavicular degeneration. 3. Right elbow lateral epicondylitis.  PROCEDURES: 1. Right shoulder arthroscopic acromioplasty. 2. Right shoulder arthroscopic acromioclavicular resection. 3. Right shoulder arthroscopic debridement. 4. Right elbow open lateral repair.  ANESTHESIA:  General and block.  ATTENDING SURGEON:  Monico Blitz. Rhona Raider, M.D.  ASSISTANT:  Loni Dolly, PA.  INDICATION FOR PROCEDURE:  The patient is a 65 year old woman with a long history of right arm difficulty.  This has persisted despite injections and therapy and pills.  She has difficulty using her arm in rest and at night.  By MRI scan, she has impingement of her rotator cuff related to the shape of her acromion and the [facility] joint.  She also has pain on the lateral aspect of the elbow.  She is offered operative intervention at this point having struggled with these problems for several years.  Informed operative consent was obtained after discussion of possible complications including reaction to anesthesia, infection, and neurovascular injury.  SUMMARY OF FINDINGS AND PROCEDURE:  Under general anesthesia and a block, a right shoulder arthroscopy was performed followed by an open procedure at the elbow.  In the shoulder, the glenohumeral joint showed no degenerative changes.  The biceps tendon and rotator cuff looked benign from below.  In the subacromial space, she  had a great deal of bursitis and irritation of her cuff and debridement was done but no tear worthy of repair was found.  She had a moderately prominent subacromial morphology addressed with an acromioplasty.  She also had bone-on-bone contact at the [facility] joint and a resection of the distal clavicle removing at least 2 cm of this bone was performed.  We then performed the open procedure at the elbow where she had some devitalized tissue with the extensor origin.  She also had a large spur which we excised.  This was repaired.  She was discharged home the same day though she might have to stay depending on her status in the recovery room.  If she does go home, I will contact her by phone tonight.  DESCRIPTION OF PROCEDURE:  The patient was taken to the operating suite where general anesthetic was applied without difficulty.  She was also given a block in the pre-anesthesia area.  She was positioned in a beach- chair position and prepped and draped in normal sterile fashion.  After the administration of IV vancomycin, and an appropriate time out, an arthroscopy of the right shoulder was performed through total of 3 portals.  Findings were as noted above.  Procedure consisted predominantly of the acromioplasty which was done with a bur in the lateral position, followed by transfer of the bur to the posterior position.  I also performed the formal [facility] resection  through an anterior portal.  The cuff was debrided but no tear worthy of repair was found. The shoulder was thoroughly irrigated, followed by reapproximation of the portals loosely with nylon.  We then turned our attention to the elbow.  We did not use a tourniquet.  I made about a 1 inch incision over the lateral epicondyle with dissection down the extensor origin. This was split longitudinally and dissected in both directions.  I removed some devitalized tissue from the undersurface of this structure in the area of its insertion.  She  also had a bony spur in this attachment site and I removed it.  The wound was thoroughly irrigated, followed by reapproximation of the split in the extensor origin with Vicryl.  Subcutaneous tissues were reapproximated with 2-0 undyed Vicryl and skin was closed with nylon.  Adaptic was placed at all of her various incisions and portals, followed by dry gauze and tape every where.  Estimated blood loss and intraoperative fluids can be obtained from anesthesia records.  DISPOSITION:  The patient was extubated in the operating room and taken to recovery room in stable condition.  She was scheduled to go home the same day but might have to stay overnight depending on the opinion of the anesthesiologist during her recovery in PACU.     Monico Blitz Rhona Raider, M.D.     PGD/MEDQ  D:  02/24/2016  T:  02/24/2016  Job:  [id number]

## 2016-02-24 NOTE — Interval H&P Note (Signed)
OK for surgery PD 

## 2016-02-24 NOTE — Op Note (Signed)
#  [id number] 

## 2016-02-24 NOTE — Discharge Instructions (Signed)
MUST KEEP ELBOW BANDAGE CLEAN AND DRY UNTIL FOLLOW UP. DO NOT REMOVE.  Arthroscopic Shoulder Surgery Post -OP Instructions  You have just had an arthroscopic operation. Your incisions (puncture sites) are small and should heal quickly. The structures inside your shoulder  may take 6-8 weeks to heal and settle down.   Pain Medication: You will be given a prescription for pain medication. Please take the medication as needed. Most patients require pain medication for only a few days up to a week.  Swelling: You can expect some swelling around  your  shoulder. Applying  ice to your shoulder will help keep the swelling to a minimum. Ice can be applied by placing ice cubes in a plastic bag and putting the bag on your shoulder with a towel between the ice bag and your skin.   The ice should be used periodically during the first 48 hours. The swelling should subside over the next several days.  Dressing: Fluid leakage is common the first night. Precautions to prevent staining of your clothes, bed sheets, etc., should be taken. This fluid was used to inflate the joint during surgery and is commonly tinged red from a small amount of blood. Remove your dressing tomorrow. Some bleeding or leakage from the puncture sites may occur for a few days and you should cover the puncture sites with Band-Aids until the leakage stops. You may shower the day after surgery with a Band-Aid over each suture. There is usually one suture at each of your puncture sites.    Activity: You will go home with a sling.  Unless instructed differently you may remove it and start to use your arm tomorrow. Gradually increase activity as you can tolerate. An increase in pain or swelling with certain activities or with increased activities may indicate you are doing too much. Back up and start building up your activities at a slower rate. You may want to use the sling for a while in public to prevent people from bumping into you.   Alternatively if your rotator cuff was repaired then you need to wear the sling (expect for showering) keeping your arm towards your side until you come in for follow-up.  All you need to do in terms of arm use if your rotator cuff has been repaired is to swing your arm gently at your side and let your elbow go straight a few times a day.     Office Check-Up: If no major problems arise and you are progressing well, we will need to see you in the office in one week unless otherwise instructed by your doctor. Please call the office to make an appointment. We will remove your sutures and discuss your surgery and rehabilitation at that time.

## 2016-02-24 NOTE — Anesthesia Preprocedure Evaluation (Addendum)
Anesthesia Evaluation  Patient identified by MRN, date of birth, ID band Patient awake    Reviewed: Allergy & Precautions, NPO status , Patient's Chart, lab work & pertinent test results  History of Anesthesia Complications (+) PONV and history of anesthetic complications  Airway Mallampati: III  TM Distance: >3 FB Neck ROM: Full    Dental   Pulmonary shortness of breath, with exertion, at rest and lying, sleep apnea and Continuous Positive Airway Pressure Ventilation ,  Non compliant with CPAP   Pulmonary exam normal breath sounds clear to auscultation       Cardiovascular + Peripheral Vascular Disease  Normal cardiovascular exam Rhythm:Regular Rate:Normal  Hx/o PTE   Neuro/Psych  Headaches, Anxiety    GI/Hepatic GERD  Medicated and Controlled,  Endo/Other  diabetes, Well Controlled, Type 2, Oral Hypoglycemic AgentsMorbid obesity  Renal/GU Renal InsufficiencyRenal disease  negative genitourinary   Musculoskeletal  (+) Arthritis ,   Abdominal (+) + obese,   Peds  Hematology  (+) anemia ,   Anesthesia Other Findings   Reproductive/Obstetrics                            Anesthesia Physical Anesthesia Plan  ASA: III  Anesthesia Plan: General   Post-op Pain Management: GA combined w/ Regional for post-op pain   Induction: Intravenous and Cricoid pressure planned  Airway Management Planned: Oral ETT  Additional Equipment:   Intra-op Plan:   Post-operative Plan: Extubation in OR  Informed Consent: I have reviewed the patients History and Physical, chart, labs and discussed the procedure including the risks, benefits and alternatives for the proposed anesthesia with the patient or authorized representative who has indicated his/her understanding and acceptance.   Dental advisory given  Plan Discussed with: CRNA, Anesthesiologist and Surgeon  Anesthesia Plan Comments:          Anesthesia Quick Evaluation

## 2016-02-24 NOTE — Anesthesia Procedure Notes (Addendum)
Anesthesia Regional Block:  Interscalene brachial plexus block  Pre-Anesthetic Checklist: ,, timeout performed, Correct Patient, Correct Site, Correct Laterality, Correct Procedure, Correct Position, site marked, Risks and benefits discussed,  Surgical consent,  Pre-op evaluation,  At surgeon's request and post-op pain management  Laterality: Right  Prep: chloraprep       Needles:  Injection technique: Single-shot  Needle Type: Echogenic Stimulator Needle     Needle Length: 10cm 10 cm Needle Gauge: 21 and 21 G  Needle insertion depth: 4 cm   Additional Needles:  Procedures: ultrasound guided (picture in chart) Interscalene brachial plexus block Narrative:  Start time: 02/24/2016 1:20 PM Injection made incrementally with aspirations every 5 mL.  Performed by: Personally  Anesthesiologist: Josephine Igo  Additional Notes: Patient tolerated procedure well.

## 2016-02-24 NOTE — Transfer of Care (Signed)
Immediate Anesthesia Transfer of Care Note  Patient: [name]  Procedure(s) Performed: Procedure(s): RIGHT SHOULDER ARTHROSCOPY, ACROMIOPLASTY, DISTAL CLAVICLE RESECTION, DEBRIDEMENT (Right) RIGHT ELBOW LATERAL REPAIR (Right)  Patient Location: PACU  Anesthesia Type:GA combined with regional for post-op pain  Level of Consciousness: awake, alert , oriented and patient cooperative  Airway & Oxygen Therapy: Patient Spontanous Breathing and Patient connected to nasal cannula oxygen  Post-op Assessment: Report given to RN and Post -op Vital signs reviewed and stable  Post vital signs: Reviewed and stable  Last Vitals:  Filed Vitals:   02/24/16 1340 02/24/16 1601  BP: 124/39 79/28  Pulse: 109 78  Temp:  36.9 C  Resp: 17 21    Complications: No apparent anesthesia complications

## 2016-02-24 NOTE — H&P (Signed)
Melrose Nakayama, MD  Chauncey Cruel, PA-C  Loni Dolly, PA-C                                  [name]/SOS                [address], [place], [place]  [address]   ORTHOPAEDIC history and physical  [name]            MRN:  [id number] DOB/SEX:  [birth year]/female     CHIEF COMPLAINT:  Painful right arm  HISTORY: [name] a 65 y.o. female with disabling right arm problems.  He shoulder wakes her up if she sleeps on that side or uses it out to the side. She also has lateral aspect elbow pain with use. She cannot work due to this dysfunction. We have injected her in the past with transient relief at best.    PAST MEDICAL HISTORY: Patient Active Problem List   Diagnosis Date Noted  . History of pulmonary embolism 03/20/2014  . Diabetes ([place]) 03/20/2014  . Morbid obesity ([place]) 03/20/2014  . Family history of heart disease 03/20/2014  . Bunion, right foot 03/20/2014   Past Medical History  Diagnosis Date  . Diabetes ([place])   . Family history of coronary artery disease   . Morbid obesity ([place])   . Venous stasis dermatitis of both lower extremities   . Shortness of breath dyspnea   . Restless leg syndrome   . Peripheral vascular disease ([place])   . Pulmonary embolism ([place]) 2001  . GERD (gastroesophageal reflux disease)   . Arthritis   . Anemia     hx of low iron  . Fatty liver   . Cataracts, bilateral   . Chronic kidney disease     nephrologist  [facility]. [facility], told pt. she is stage III  . Sleep apnea     can't wear her cpap due to nose being sore  . PONV (postoperative nausea and vomiting)   . Anxiety   . Headache    Past Surgical History  Procedure Laterality Date  . Joint replacement Right 2001    knee  . Colonoscopy    . Appendectomy    . Abdominal hysterectomy    . Cholecystectomy    . Fracture surgery Left     shoulder  . Hernia repair      umbilical hernia  . Cardiac catheterization  2014  . Bunionectomy  Right 01/28/2015    Procedure: Lillard Anes;  Surgeon: Hessie Dibble, MD;  Location: [place] [facility];  Service: Bridges;  Laterality: Right;  . Panniculectomy    . Colonoscopy       MEDICATIONS:   Current facility-administered medications:  .  0.9 %  sodium chloride infusion, , Intravenous, Continuous, Brandi Igo, MD, Last Rate: 10 mL/hr at 02/24/16 1247, 10 mL/hr at 02/24/16 1247 .  chlorhexidine (HIBICLENS) 4 % liquid 4 application, 60 mL, Topical, Once, Melrose Nakayama, MD .  lactated ringers infusion, , Intravenous, Continuous, Melrose Nakayama, MD .  vancomycin (VANCOCIN) 1,500 mg in sodium chloride 0.9 % 500 mL IVPB, 1,500 mg, Intravenous, To SS-Surg, Melrose Nakayama, MD  Facility-Administered Medications Ordered in Other Encounters:  .  bupivacaine-epinephrine (MARCAINE W/ EPI) 0.5% -1:200000 injection, , , Anesthesia Intra-op, Brandi Igo, MD, 25 mL at 02/24/16 1318 .  lactated ringers infusion, , , Continuous PRN, Laretta Alstrom, CRNA  ALLERGIES:  Allergies  Allergen Reactions  . Amlodipine Itching and Rash  . Sulfa Antibiotics Itching  . Penicillins Itching and Rash    Has patient had a PCN reaction causing immediate rash, facial/tongue/throat swelling, SOB or lightheadedness with hypotension: Yes    REVIEW OF SYSTEMS: REVIEWED IN DETAIL IN CHART  FAMILY HISTORY:   Family History  Problem Relation Age of Onset  . Heart attack Mother   . Heart attack Father   . Renal Disease Sister   . Heart failure Sister   . Parkinsonism Brother   . Hypertension Sister   . Thyroid disease Sister   . Crohn's disease Sister     SOCIAL HISTORY:   Social History  Substance Use Topics  . Smoking status: Never Smoker   . Smokeless tobacco: Never Used  . Alcohol Use: No     EXAMINATION: Vital signs in last 24 hours: Temp:  [98.4 F (36.9 C)] 98.4 F (36.9 C) (02/28 1223) Pulse Rate:  [75-109] 109 (02/28 1340) Resp:  [14-30] 17 (02/28 1340) BP: (101-124)/(39-45) 124/39 mmHg  (02/28 1340) SpO2:  [99 %-100 %] 99 % (02/28 1340) Weight:  [297 lb (134.718 kg)] 297 lb (134.718 kg) (02/28 1223)  BP 124/39 mmHg  Pulse 109  Temp(Src) 98.4 F (36.9 C) (Oral)  Resp 17  Wt 297 lb (134.718 kg)  SpO2 99%  General Appearance:    Alert, cooperative, no distress, appears stated age, very overweight  Head:    Normocephalic, without obvious abnormality, atraumatic  Eyes:    PERRL, conjunctiva/corneas clear, EOM's intact, fundi    benign, both eyes  Ears:    Normal TM's and external ear canals, both ears  Nose:   Nares normal, septum midline, mucosa normal, no drainage    or sinus tenderness  Throat:   Lips, mucosa, and tongue normal; teeth and gums normal  Neck:   Supple, symmetrical, trachea midline, no adenopathy;    thyroid:  no enlargement/tenderness/nodules; no carotid   bruit or JVD  Back:     Symmetric, no curvature, ROM normal, no CVA tenderness  Lungs:     Clear to auscultation bilaterally, respirations unlabored  Chest Wall:    No tenderness or deformity   Heart:    Regular rate and rhythm, S1 and S2 normal, no murmur, rub   or gallop  Breast Exam:    No tenderness, masses, or nipple abnormality  Abdomen:     Soft, non-tender, bowel sounds active all four quadrants,    no masses, no organomegaly  Genitalia:    Rectal:    Extremities:   Extremities normal, atraumatic, no cyanosis or edema  Pulses:   2+ and symmetric all extremities  Skin:   Skin color, texture, turgor normal, no rashes or lesions  Lymph nodes:   Cervical, supraclavicular, and axillary nodes normal  Neurologic:   CNII-XII intact, normal strength, sensation and reflexes    throughout    Musculoskeletal Exam:   Right sh very tender to impingement testing.  Also with pain at [facility] -  joint. Elbow tender at llateral epicondyle and with use of wrist extensors.   DIAGNOSTIC STUDIES: Recent laboratory studies: No results for input(s): WBC, HGB, HCT, PLT in the last 168 hours. No results for  input(s): NA, K, CL, CO2, BUN, CREATININE, GLUCOSE, CALCIUM in the last 168 hours. No results found for: INR, PROTIME   Recent Radiographic Studies :  No results found.  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 Have received clearance after consultation with her family doctor and after anesthesia consult.  Will perform in main OR due to her size.  Tabbetha Kutscher G 02/24/2016, 1:48 PM

## 2016-02-24 NOTE — Anesthesia Postprocedure Evaluation (Signed)
Anesthesia Post Note  Patient: [name]  Procedure(s) Performed: Procedure(s) (LRB): RIGHT SHOULDER ARTHROSCOPY, ACROMIOPLASTY, DISTAL CLAVICLE RESECTION, DEBRIDEMENT (Right) RIGHT ELBOW LATERAL REPAIR (Right)  Patient location during evaluation: PACU Anesthesia Type: General Level of consciousness: awake and alert and oriented Pain management: pain level controlled Vital Signs Assessment: post-procedure vital signs reviewed and stable Respiratory status: spontaneous breathing, nonlabored ventilation and respiratory function stable Cardiovascular status: blood pressure returned to baseline and stable Postop Assessment: no signs of nausea or vomiting Anesthetic complications: no Comments: Good sensory and motor block RUE    Last Vitals:  Filed Vitals:   02/24/16 1643 02/24/16 1648  BP: 94/41 97/41  Pulse: 82 85  Temp:    Resp: 22 24    Last Pain:  Filed Vitals:   02/24/16 1652  PainSc: 7                  Aceson Labell A.

## 2016-02-25 ENCOUNTER — Encounter (HOSPITAL_COMMUNITY): Payer: Self-pay | Admitting: Orthopaedic Surgery

## 2016-02-25 LAB — GLUCOSE, CAPILLARY: Glucose-Capillary: 141 mg/dL — ABNORMAL HIGH (ref 65–99)

## 2016-02-26 NOTE — Progress Notes (Signed)
Called patient for follow up phone call, pt reports having some shortness of breath and feels like it is hard to take a deep breath since surgery but reports she is not in any acute distress.  Pt also reports swelling and shininess in BLE since surgery and voiced concern.  Instructed patient to call her primary doctor, but if the breathing gets worse to go to emergency room.  Pt verbalized understanding of plan.

## 2016-03-03 DIAGNOSIS — Z9889 Other specified postprocedural states: Secondary | ICD-10-CM | POA: Diagnosis not present

## 2016-03-18 DIAGNOSIS — M6281 Muscle weakness (generalized): Secondary | ICD-10-CM | POA: Diagnosis not present

## 2016-03-18 DIAGNOSIS — M25521 Pain in right elbow: Secondary | ICD-10-CM | POA: Diagnosis not present

## 2016-03-18 DIAGNOSIS — M25511 Pain in right shoulder: Secondary | ICD-10-CM | POA: Diagnosis not present

## 2016-03-24 DIAGNOSIS — M6281 Muscle weakness (generalized): Secondary | ICD-10-CM | POA: Diagnosis not present

## 2016-03-24 DIAGNOSIS — Z9889 Other specified postprocedural states: Secondary | ICD-10-CM | POA: Diagnosis not present

## 2016-03-24 DIAGNOSIS — M25511 Pain in right shoulder: Secondary | ICD-10-CM | POA: Diagnosis not present

## 2016-03-24 DIAGNOSIS — M25521 Pain in right elbow: Secondary | ICD-10-CM | POA: Diagnosis not present

## 2016-03-29 DIAGNOSIS — M25521 Pain in right elbow: Secondary | ICD-10-CM | POA: Diagnosis not present

## 2016-03-29 DIAGNOSIS — M6281 Muscle weakness (generalized): Secondary | ICD-10-CM | POA: Diagnosis not present

## 2016-03-29 DIAGNOSIS — M25511 Pain in right shoulder: Secondary | ICD-10-CM | POA: Diagnosis not present

## 2016-03-31 DIAGNOSIS — M6281 Muscle weakness (generalized): Secondary | ICD-10-CM | POA: Diagnosis not present

## 2016-03-31 DIAGNOSIS — M25511 Pain in right shoulder: Secondary | ICD-10-CM | POA: Diagnosis not present

## 2016-03-31 DIAGNOSIS — M25521 Pain in right elbow: Secondary | ICD-10-CM | POA: Diagnosis not present

## 2016-04-05 DIAGNOSIS — M25521 Pain in right elbow: Secondary | ICD-10-CM | POA: Diagnosis not present

## 2016-04-05 DIAGNOSIS — M25511 Pain in right shoulder: Secondary | ICD-10-CM | POA: Diagnosis not present

## 2016-04-05 DIAGNOSIS — M6281 Muscle weakness (generalized): Secondary | ICD-10-CM | POA: Diagnosis not present

## 2016-04-07 DIAGNOSIS — M6281 Muscle weakness (generalized): Secondary | ICD-10-CM | POA: Diagnosis not present

## 2016-04-07 DIAGNOSIS — M25521 Pain in right elbow: Secondary | ICD-10-CM | POA: Diagnosis not present

## 2016-04-07 DIAGNOSIS — M25511 Pain in right shoulder: Secondary | ICD-10-CM | POA: Diagnosis not present

## 2016-04-12 DIAGNOSIS — M25521 Pain in right elbow: Secondary | ICD-10-CM | POA: Diagnosis not present

## 2016-04-12 DIAGNOSIS — M25511 Pain in right shoulder: Secondary | ICD-10-CM | POA: Diagnosis not present

## 2016-04-21 DIAGNOSIS — M25521 Pain in right elbow: Secondary | ICD-10-CM | POA: Diagnosis not present

## 2016-04-21 DIAGNOSIS — M25511 Pain in right shoulder: Secondary | ICD-10-CM | POA: Diagnosis not present

## 2016-04-21 DIAGNOSIS — Z9889 Other specified postprocedural states: Secondary | ICD-10-CM | POA: Diagnosis not present

## 2016-04-21 DIAGNOSIS — M6281 Muscle weakness (generalized): Secondary | ICD-10-CM | POA: Diagnosis not present

## 2016-04-22 LAB — BASIC METABOLIC PANEL
BUN: 18 mg/dL (ref 4–21)
Creatinine: 1.4 mg/dL — AB (ref 0.5–1.1)
Glucose: 101 mg/dL
Potassium: 4.4 mmol/L (ref 3.4–5.3)
Sodium: 143 mmol/L (ref 137–147)

## 2016-04-22 LAB — HEPATIC FUNCTION PANEL
ALT: 15 U/L (ref 7–35)
AST: 17 U/L (ref 13–35)
Alkaline Phosphatase: 96 U/L (ref 25–125)
Bilirubin, Total: 0.6 mg/dL

## 2016-04-22 LAB — LIPID PANEL
Cholesterol: 160 mg/dL (ref 0–200)
HDL: 50 mg/dL (ref 35–70)
LDL Cholesterol: 73 mg/dL
Triglycerides: 188 mg/dL — AB (ref 40–160)

## 2016-04-22 LAB — MICROALBUMIN, URINE: Microalb, Ur: 52

## 2016-04-22 LAB — HEMOGLOBIN A1C: Hemoglobin A1C: 5.3

## 2016-05-03 DIAGNOSIS — M25511 Pain in right shoulder: Secondary | ICD-10-CM | POA: Diagnosis not present

## 2016-05-03 DIAGNOSIS — M25521 Pain in right elbow: Secondary | ICD-10-CM | POA: Diagnosis not present

## 2016-05-03 DIAGNOSIS — M6281 Muscle weakness (generalized): Secondary | ICD-10-CM | POA: Diagnosis not present

## 2016-05-04 DIAGNOSIS — N189 Chronic kidney disease, unspecified: Secondary | ICD-10-CM | POA: Diagnosis not present

## 2016-05-10 DIAGNOSIS — D509 Iron deficiency anemia, unspecified: Secondary | ICD-10-CM | POA: Diagnosis not present

## 2016-05-10 DIAGNOSIS — E538 Deficiency of other specified B group vitamins: Secondary | ICD-10-CM | POA: Diagnosis not present

## 2016-05-10 DIAGNOSIS — M25511 Pain in right shoulder: Secondary | ICD-10-CM | POA: Diagnosis not present

## 2016-05-10 DIAGNOSIS — M6281 Muscle weakness (generalized): Secondary | ICD-10-CM | POA: Diagnosis not present

## 2016-05-10 DIAGNOSIS — M25521 Pain in right elbow: Secondary | ICD-10-CM | POA: Diagnosis not present

## 2016-05-11 DIAGNOSIS — I10 Essential (primary) hypertension: Secondary | ICD-10-CM | POA: Diagnosis not present

## 2016-05-11 DIAGNOSIS — H353130 Nonexudative age-related macular degeneration, bilateral, stage unspecified: Secondary | ICD-10-CM | POA: Diagnosis not present

## 2016-05-11 DIAGNOSIS — H35363 Drusen (degenerative) of macula, bilateral: Secondary | ICD-10-CM | POA: Diagnosis not present

## 2016-05-11 DIAGNOSIS — H1013 Acute atopic conjunctivitis, bilateral: Secondary | ICD-10-CM | POA: Diagnosis not present

## 2016-05-11 DIAGNOSIS — J449 Chronic obstructive pulmonary disease, unspecified: Secondary | ICD-10-CM | POA: Diagnosis not present

## 2016-05-11 DIAGNOSIS — H25813 Combined forms of age-related cataract, bilateral: Secondary | ICD-10-CM | POA: Diagnosis not present

## 2016-05-11 DIAGNOSIS — Z882 Allergy status to sulfonamides status: Secondary | ICD-10-CM | POA: Diagnosis not present

## 2016-05-11 DIAGNOSIS — H02831 Dermatochalasis of right upper eyelid: Secondary | ICD-10-CM | POA: Diagnosis not present

## 2016-05-11 DIAGNOSIS — H02834 Dermatochalasis of left upper eyelid: Secondary | ICD-10-CM | POA: Diagnosis not present

## 2016-05-11 DIAGNOSIS — Z79899 Other long term (current) drug therapy: Secondary | ICD-10-CM | POA: Diagnosis not present

## 2016-05-11 DIAGNOSIS — Z880 Allergy status to penicillin: Secondary | ICD-10-CM | POA: Diagnosis not present

## 2016-05-11 DIAGNOSIS — Z7982 Long term (current) use of aspirin: Secondary | ICD-10-CM | POA: Diagnosis not present

## 2016-05-11 DIAGNOSIS — Z7984 Long term (current) use of oral hypoglycemic drugs: Secondary | ICD-10-CM | POA: Diagnosis not present

## 2016-05-11 DIAGNOSIS — E1136 Type 2 diabetes mellitus with diabetic cataract: Secondary | ICD-10-CM | POA: Diagnosis not present

## 2016-05-11 DIAGNOSIS — E785 Hyperlipidemia, unspecified: Secondary | ICD-10-CM | POA: Diagnosis not present

## 2016-05-11 DIAGNOSIS — Z888 Allergy status to other drugs, medicaments and biological substances status: Secondary | ICD-10-CM | POA: Diagnosis not present

## 2016-05-12 DIAGNOSIS — M25511 Pain in right shoulder: Secondary | ICD-10-CM | POA: Diagnosis not present

## 2016-05-12 DIAGNOSIS — M6281 Muscle weakness (generalized): Secondary | ICD-10-CM | POA: Diagnosis not present

## 2016-05-12 DIAGNOSIS — M25521 Pain in right elbow: Secondary | ICD-10-CM | POA: Diagnosis not present

## 2016-05-14 DIAGNOSIS — M25521 Pain in right elbow: Secondary | ICD-10-CM | POA: Diagnosis not present

## 2016-05-14 DIAGNOSIS — M25511 Pain in right shoulder: Secondary | ICD-10-CM | POA: Diagnosis not present

## 2016-05-14 DIAGNOSIS — M6281 Muscle weakness (generalized): Secondary | ICD-10-CM | POA: Diagnosis not present

## 2016-05-17 DIAGNOSIS — M25511 Pain in right shoulder: Secondary | ICD-10-CM | POA: Diagnosis not present

## 2016-05-17 DIAGNOSIS — M25521 Pain in right elbow: Secondary | ICD-10-CM | POA: Diagnosis not present

## 2016-05-17 DIAGNOSIS — M6281 Muscle weakness (generalized): Secondary | ICD-10-CM | POA: Diagnosis not present

## 2016-05-19 DIAGNOSIS — M25511 Pain in right shoulder: Secondary | ICD-10-CM | POA: Diagnosis not present

## 2016-05-19 DIAGNOSIS — M6281 Muscle weakness (generalized): Secondary | ICD-10-CM | POA: Diagnosis not present

## 2016-05-19 DIAGNOSIS — M25521 Pain in right elbow: Secondary | ICD-10-CM | POA: Diagnosis not present

## 2016-05-21 DIAGNOSIS — M25521 Pain in right elbow: Secondary | ICD-10-CM | POA: Diagnosis not present

## 2016-05-21 DIAGNOSIS — M25511 Pain in right shoulder: Secondary | ICD-10-CM | POA: Diagnosis not present

## 2016-05-21 DIAGNOSIS — M6281 Muscle weakness (generalized): Secondary | ICD-10-CM | POA: Diagnosis not present

## 2016-05-26 DIAGNOSIS — M25511 Pain in right shoulder: Secondary | ICD-10-CM | POA: Diagnosis not present

## 2016-05-26 DIAGNOSIS — M25521 Pain in right elbow: Secondary | ICD-10-CM | POA: Diagnosis not present

## 2016-05-26 DIAGNOSIS — M6281 Muscle weakness (generalized): Secondary | ICD-10-CM | POA: Diagnosis not present

## 2016-05-31 DIAGNOSIS — M6281 Muscle weakness (generalized): Secondary | ICD-10-CM | POA: Diagnosis not present

## 2016-05-31 DIAGNOSIS — M25511 Pain in right shoulder: Secondary | ICD-10-CM | POA: Diagnosis not present

## 2016-05-31 DIAGNOSIS — M25521 Pain in right elbow: Secondary | ICD-10-CM | POA: Diagnosis not present

## 2016-06-02 DIAGNOSIS — M25521 Pain in right elbow: Secondary | ICD-10-CM | POA: Diagnosis not present

## 2016-06-02 DIAGNOSIS — M25511 Pain in right shoulder: Secondary | ICD-10-CM | POA: Diagnosis not present

## 2016-06-02 DIAGNOSIS — M67431 Ganglion, right wrist: Secondary | ICD-10-CM | POA: Diagnosis not present

## 2016-06-02 DIAGNOSIS — M6281 Muscle weakness (generalized): Secondary | ICD-10-CM | POA: Diagnosis not present

## 2016-06-03 DIAGNOSIS — E538 Deficiency of other specified B group vitamins: Secondary | ICD-10-CM | POA: Diagnosis not present

## 2016-06-03 DIAGNOSIS — D500 Iron deficiency anemia secondary to blood loss (chronic): Secondary | ICD-10-CM | POA: Diagnosis not present

## 2016-06-03 DIAGNOSIS — D509 Iron deficiency anemia, unspecified: Secondary | ICD-10-CM | POA: Diagnosis not present

## 2016-06-03 DIAGNOSIS — R5383 Other fatigue: Secondary | ICD-10-CM | POA: Diagnosis not present

## 2016-06-03 DIAGNOSIS — D519 Vitamin B12 deficiency anemia, unspecified: Secondary | ICD-10-CM | POA: Diagnosis not present

## 2016-06-03 DIAGNOSIS — K760 Fatty (change of) liver, not elsewhere classified: Secondary | ICD-10-CM | POA: Diagnosis not present

## 2016-06-03 DIAGNOSIS — D638 Anemia in other chronic diseases classified elsewhere: Secondary | ICD-10-CM | POA: Diagnosis not present

## 2016-06-14 DIAGNOSIS — M8580 Other specified disorders of bone density and structure, unspecified site: Secondary | ICD-10-CM | POA: Diagnosis not present

## 2016-06-14 DIAGNOSIS — S022XXA Fracture of nasal bones, initial encounter for closed fracture: Secondary | ICD-10-CM | POA: Diagnosis not present

## 2016-06-14 DIAGNOSIS — E119 Type 2 diabetes mellitus without complications: Secondary | ICD-10-CM | POA: Diagnosis not present

## 2016-06-14 DIAGNOSIS — K7460 Unspecified cirrhosis of liver: Secondary | ICD-10-CM | POA: Diagnosis not present

## 2016-06-14 DIAGNOSIS — S0181XA Laceration without foreign body of other part of head, initial encounter: Secondary | ICD-10-CM | POA: Diagnosis not present

## 2016-06-14 DIAGNOSIS — Z6841 Body Mass Index (BMI) 40.0 and over, adult: Secondary | ICD-10-CM | POA: Diagnosis not present

## 2016-06-14 DIAGNOSIS — R109 Unspecified abdominal pain: Secondary | ICD-10-CM | POA: Diagnosis not present

## 2016-06-14 DIAGNOSIS — N183 Chronic kidney disease, stage 3 (moderate): Secondary | ICD-10-CM | POA: Diagnosis not present

## 2016-06-14 DIAGNOSIS — R079 Chest pain, unspecified: Secondary | ICD-10-CM | POA: Diagnosis not present

## 2016-06-14 DIAGNOSIS — I491 Atrial premature depolarization: Secondary | ICD-10-CM | POA: Diagnosis not present

## 2016-06-14 DIAGNOSIS — J45909 Unspecified asthma, uncomplicated: Secondary | ICD-10-CM | POA: Diagnosis not present

## 2016-06-14 DIAGNOSIS — E669 Obesity, unspecified: Secondary | ICD-10-CM | POA: Diagnosis not present

## 2016-06-14 DIAGNOSIS — I129 Hypertensive chronic kidney disease with stage 1 through stage 4 chronic kidney disease, or unspecified chronic kidney disease: Secondary | ICD-10-CM | POA: Diagnosis not present

## 2016-06-14 DIAGNOSIS — R0789 Other chest pain: Secondary | ICD-10-CM | POA: Diagnosis not present

## 2016-06-14 DIAGNOSIS — W19XXXA Unspecified fall, initial encounter: Secondary | ICD-10-CM | POA: Diagnosis not present

## 2016-06-14 DIAGNOSIS — Z90710 Acquired absence of both cervix and uterus: Secondary | ICD-10-CM | POA: Diagnosis not present

## 2016-06-14 DIAGNOSIS — S0003XA Contusion of scalp, initial encounter: Secondary | ICD-10-CM | POA: Diagnosis not present

## 2016-06-14 DIAGNOSIS — Z9089 Acquired absence of other organs: Secondary | ICD-10-CM | POA: Diagnosis not present

## 2016-06-14 DIAGNOSIS — R161 Splenomegaly, not elsewhere classified: Secondary | ICD-10-CM | POA: Diagnosis not present

## 2016-06-14 DIAGNOSIS — M545 Low back pain: Secondary | ICD-10-CM | POA: Diagnosis not present

## 2016-06-14 DIAGNOSIS — I517 Cardiomegaly: Secondary | ICD-10-CM | POA: Diagnosis not present

## 2016-06-14 DIAGNOSIS — M549 Dorsalgia, unspecified: Secondary | ICD-10-CM | POA: Diagnosis not present

## 2016-06-14 DIAGNOSIS — K5790 Diverticulosis of intestine, part unspecified, without perforation or abscess without bleeding: Secondary | ICD-10-CM | POA: Diagnosis not present

## 2016-06-14 DIAGNOSIS — K219 Gastro-esophageal reflux disease without esophagitis: Secondary | ICD-10-CM | POA: Diagnosis not present

## 2016-06-14 DIAGNOSIS — M546 Pain in thoracic spine: Secondary | ICD-10-CM | POA: Diagnosis not present

## 2016-06-14 DIAGNOSIS — Z96651 Presence of right artificial knee joint: Secondary | ICD-10-CM | POA: Diagnosis not present

## 2016-06-14 DIAGNOSIS — J449 Chronic obstructive pulmonary disease, unspecified: Secondary | ICD-10-CM | POA: Diagnosis not present

## 2016-06-14 DIAGNOSIS — S0083XA Contusion of other part of head, initial encounter: Secondary | ICD-10-CM | POA: Diagnosis not present

## 2016-06-14 DIAGNOSIS — M542 Cervicalgia: Secondary | ICD-10-CM | POA: Diagnosis not present

## 2016-06-14 DIAGNOSIS — S098XXA Other specified injuries of head, initial encounter: Secondary | ICD-10-CM | POA: Diagnosis not present

## 2016-06-14 DIAGNOSIS — E785 Hyperlipidemia, unspecified: Secondary | ICD-10-CM | POA: Diagnosis not present

## 2016-06-14 DIAGNOSIS — N281 Cyst of kidney, acquired: Secondary | ICD-10-CM | POA: Diagnosis not present

## 2016-06-14 DIAGNOSIS — M1712 Unilateral primary osteoarthritis, left knee: Secondary | ICD-10-CM | POA: Diagnosis not present

## 2016-06-14 DIAGNOSIS — F418 Other specified anxiety disorders: Secondary | ICD-10-CM | POA: Diagnosis not present

## 2016-06-14 DIAGNOSIS — Z9049 Acquired absence of other specified parts of digestive tract: Secondary | ICD-10-CM | POA: Diagnosis not present

## 2016-06-14 DIAGNOSIS — W01198A Fall on same level from slipping, tripping and stumbling with subsequent striking against other object, initial encounter: Secondary | ICD-10-CM | POA: Diagnosis not present

## 2016-06-14 DIAGNOSIS — M25462 Effusion, left knee: Secondary | ICD-10-CM | POA: Diagnosis not present

## 2016-06-14 DIAGNOSIS — R51 Headache: Secondary | ICD-10-CM | POA: Diagnosis not present

## 2016-06-14 DIAGNOSIS — Z043 Encounter for examination and observation following other accident: Secondary | ICD-10-CM | POA: Diagnosis not present

## 2016-06-14 DIAGNOSIS — S0093XA Contusion of unspecified part of head, initial encounter: Secondary | ICD-10-CM | POA: Diagnosis not present

## 2016-06-15 DIAGNOSIS — I491 Atrial premature depolarization: Secondary | ICD-10-CM | POA: Diagnosis not present

## 2016-06-18 DIAGNOSIS — S022XXA Fracture of nasal bones, initial encounter for closed fracture: Secondary | ICD-10-CM | POA: Diagnosis not present

## 2016-06-24 DIAGNOSIS — G2581 Restless legs syndrome: Secondary | ICD-10-CM | POA: Diagnosis not present

## 2016-06-24 DIAGNOSIS — N3941 Urge incontinence: Secondary | ICD-10-CM | POA: Diagnosis not present

## 2016-06-24 DIAGNOSIS — M109 Gout, unspecified: Secondary | ICD-10-CM | POA: Diagnosis not present

## 2016-06-24 DIAGNOSIS — F329 Major depressive disorder, single episode, unspecified: Secondary | ICD-10-CM | POA: Diagnosis not present

## 2016-06-24 DIAGNOSIS — E669 Obesity, unspecified: Secondary | ICD-10-CM | POA: Diagnosis not present

## 2016-06-24 DIAGNOSIS — J449 Chronic obstructive pulmonary disease, unspecified: Secondary | ICD-10-CM | POA: Diagnosis not present

## 2016-06-24 DIAGNOSIS — E1143 Type 2 diabetes mellitus with diabetic autonomic (poly)neuropathy: Secondary | ICD-10-CM | POA: Diagnosis not present

## 2016-06-24 DIAGNOSIS — M1612 Unilateral primary osteoarthritis, left hip: Secondary | ICD-10-CM | POA: Diagnosis not present

## 2016-06-24 DIAGNOSIS — M1712 Unilateral primary osteoarthritis, left knee: Secondary | ICD-10-CM | POA: Diagnosis not present

## 2016-06-24 DIAGNOSIS — M47812 Spondylosis without myelopathy or radiculopathy, cervical region: Secondary | ICD-10-CM | POA: Diagnosis not present

## 2016-06-24 DIAGNOSIS — E785 Hyperlipidemia, unspecified: Secondary | ICD-10-CM | POA: Diagnosis not present

## 2016-06-24 DIAGNOSIS — K219 Gastro-esophageal reflux disease without esophagitis: Secondary | ICD-10-CM | POA: Diagnosis not present

## 2016-06-24 DIAGNOSIS — D509 Iron deficiency anemia, unspecified: Secondary | ICD-10-CM | POA: Diagnosis not present

## 2016-06-24 DIAGNOSIS — H25813 Combined forms of age-related cataract, bilateral: Secondary | ICD-10-CM | POA: Diagnosis not present

## 2016-06-24 DIAGNOSIS — S022XXA Fracture of nasal bones, initial encounter for closed fracture: Secondary | ICD-10-CM | POA: Diagnosis not present

## 2016-06-24 DIAGNOSIS — J45909 Unspecified asthma, uncomplicated: Secondary | ICD-10-CM | POA: Diagnosis not present

## 2016-06-24 DIAGNOSIS — G4733 Obstructive sleep apnea (adult) (pediatric): Secondary | ICD-10-CM | POA: Diagnosis not present

## 2016-06-24 DIAGNOSIS — M4802 Spinal stenosis, cervical region: Secondary | ICD-10-CM | POA: Diagnosis not present

## 2016-06-24 DIAGNOSIS — K3184 Gastroparesis: Secondary | ICD-10-CM | POA: Diagnosis not present

## 2016-06-24 DIAGNOSIS — H353131 Nonexudative age-related macular degeneration, bilateral, early dry stage: Secondary | ICD-10-CM | POA: Diagnosis not present

## 2016-06-24 DIAGNOSIS — E538 Deficiency of other specified B group vitamins: Secondary | ICD-10-CM | POA: Diagnosis not present

## 2016-06-24 DIAGNOSIS — N183 Chronic kidney disease, stage 3 (moderate): Secondary | ICD-10-CM | POA: Diagnosis not present

## 2016-06-24 DIAGNOSIS — F419 Anxiety disorder, unspecified: Secondary | ICD-10-CM | POA: Diagnosis not present

## 2016-06-24 DIAGNOSIS — I129 Hypertensive chronic kidney disease with stage 1 through stage 4 chronic kidney disease, or unspecified chronic kidney disease: Secondary | ICD-10-CM | POA: Diagnosis not present

## 2016-06-24 DIAGNOSIS — K760 Fatty (change of) liver, not elsewhere classified: Secondary | ICD-10-CM | POA: Diagnosis not present

## 2016-07-14 DIAGNOSIS — Z8709 Personal history of other diseases of the respiratory system: Secondary | ICD-10-CM | POA: Diagnosis not present

## 2016-07-14 DIAGNOSIS — R1310 Dysphagia, unspecified: Secondary | ICD-10-CM | POA: Diagnosis not present

## 2016-07-14 DIAGNOSIS — R358 Other polyuria: Secondary | ICD-10-CM | POA: Diagnosis not present

## 2016-07-14 DIAGNOSIS — R682 Dry mouth, unspecified: Secondary | ICD-10-CM | POA: Diagnosis not present

## 2016-07-14 DIAGNOSIS — R0981 Nasal congestion: Secondary | ICD-10-CM | POA: Diagnosis not present

## 2016-07-14 DIAGNOSIS — R631 Polydipsia: Secondary | ICD-10-CM | POA: Diagnosis not present

## 2016-07-14 DIAGNOSIS — G4733 Obstructive sleep apnea (adult) (pediatric): Secondary | ICD-10-CM | POA: Diagnosis not present

## 2016-07-17 DIAGNOSIS — G894 Chronic pain syndrome: Secondary | ICD-10-CM | POA: Diagnosis not present

## 2016-07-17 DIAGNOSIS — S0990XS Unspecified injury of head, sequela: Secondary | ICD-10-CM | POA: Diagnosis not present

## 2016-07-17 DIAGNOSIS — R51 Headache: Secondary | ICD-10-CM | POA: Diagnosis not present

## 2016-07-20 ENCOUNTER — Ambulatory Visit (INDEPENDENT_AMBULATORY_CARE_PROVIDER_SITE_OTHER): Payer: Medicare Other | Admitting: Physician Assistant

## 2016-07-20 ENCOUNTER — Encounter: Payer: Self-pay | Admitting: Physician Assistant

## 2016-07-20 VITALS — BP 118/38 | HR 89 | Ht 60.0 in | Wt 300.0 lb

## 2016-07-20 DIAGNOSIS — K219 Gastro-esophageal reflux disease without esophagitis: Secondary | ICD-10-CM

## 2016-07-20 DIAGNOSIS — N183 Chronic kidney disease, stage 3 unspecified: Secondary | ICD-10-CM

## 2016-07-20 DIAGNOSIS — H353190 Nonexudative age-related macular degeneration, unspecified eye, stage unspecified: Secondary | ICD-10-CM | POA: Insufficient documentation

## 2016-07-20 DIAGNOSIS — E0822 Diabetes mellitus due to underlying condition with diabetic chronic kidney disease: Secondary | ICD-10-CM | POA: Diagnosis not present

## 2016-07-20 DIAGNOSIS — F32A Depression, unspecified: Secondary | ICD-10-CM

## 2016-07-20 DIAGNOSIS — R5383 Other fatigue: Secondary | ICD-10-CM | POA: Diagnosis not present

## 2016-07-20 DIAGNOSIS — R51 Headache: Secondary | ICD-10-CM

## 2016-07-20 DIAGNOSIS — E785 Hyperlipidemia, unspecified: Secondary | ICD-10-CM

## 2016-07-20 DIAGNOSIS — Z9989 Dependence on other enabling machines and devices: Secondary | ICD-10-CM

## 2016-07-20 DIAGNOSIS — S022XXS Fracture of nasal bones, sequela: Secondary | ICD-10-CM

## 2016-07-20 DIAGNOSIS — R600 Localized edema: Secondary | ICD-10-CM

## 2016-07-20 DIAGNOSIS — F329 Major depressive disorder, single episode, unspecified: Secondary | ICD-10-CM

## 2016-07-20 DIAGNOSIS — G4733 Obstructive sleep apnea (adult) (pediatric): Secondary | ICD-10-CM | POA: Diagnosis not present

## 2016-07-20 DIAGNOSIS — J42 Unspecified chronic bronchitis: Secondary | ICD-10-CM

## 2016-07-20 DIAGNOSIS — R519 Headache, unspecified: Secondary | ICD-10-CM

## 2016-07-20 LAB — CBC
HCT: 31.6 % — ABNORMAL LOW (ref 35.0–45.0)
Hemoglobin: 10.3 g/dL — ABNORMAL LOW (ref 11.7–15.5)
MCH: 29.9 pg (ref 27.0–33.0)
MCHC: 32.6 g/dL (ref 32.0–36.0)
MCV: 91.6 fL (ref 80.0–100.0)
MPV: 10 fL (ref 7.5–12.5)
Platelets: 167 10*3/uL (ref 140–400)
RBC: 3.45 MIL/uL — ABNORMAL LOW (ref 3.80–5.10)
RDW: 15.5 % — ABNORMAL HIGH (ref 11.0–15.0)
WBC: 7.7 10*3/uL (ref 3.8–10.8)

## 2016-07-21 ENCOUNTER — Encounter: Payer: Self-pay | Admitting: Physician Assistant

## 2016-07-21 DIAGNOSIS — Z9989 Dependence on other enabling machines and devices: Secondary | ICD-10-CM | POA: Insufficient documentation

## 2016-07-21 DIAGNOSIS — E785 Hyperlipidemia, unspecified: Secondary | ICD-10-CM | POA: Insufficient documentation

## 2016-07-21 DIAGNOSIS — S022XXA Fracture of nasal bones, initial encounter for closed fracture: Secondary | ICD-10-CM | POA: Insufficient documentation

## 2016-07-21 DIAGNOSIS — J42 Unspecified chronic bronchitis: Secondary | ICD-10-CM | POA: Insufficient documentation

## 2016-07-21 DIAGNOSIS — R5383 Other fatigue: Secondary | ICD-10-CM | POA: Insufficient documentation

## 2016-07-21 DIAGNOSIS — F32A Depression, unspecified: Secondary | ICD-10-CM | POA: Insufficient documentation

## 2016-07-21 DIAGNOSIS — K219 Gastro-esophageal reflux disease without esophagitis: Secondary | ICD-10-CM | POA: Insufficient documentation

## 2016-07-21 DIAGNOSIS — F329 Major depressive disorder, single episode, unspecified: Secondary | ICD-10-CM | POA: Insufficient documentation

## 2016-07-21 DIAGNOSIS — R519 Headache, unspecified: Secondary | ICD-10-CM | POA: Insufficient documentation

## 2016-07-21 DIAGNOSIS — R51 Headache: Secondary | ICD-10-CM

## 2016-07-21 DIAGNOSIS — D509 Iron deficiency anemia, unspecified: Secondary | ICD-10-CM | POA: Insufficient documentation

## 2016-07-21 DIAGNOSIS — E538 Deficiency of other specified B group vitamins: Secondary | ICD-10-CM | POA: Insufficient documentation

## 2016-07-21 DIAGNOSIS — R600 Localized edema: Secondary | ICD-10-CM | POA: Insufficient documentation

## 2016-07-21 DIAGNOSIS — G4733 Obstructive sleep apnea (adult) (pediatric): Secondary | ICD-10-CM | POA: Insufficient documentation

## 2016-07-21 LAB — VITAMIN D 25 HYDROXY (VIT D DEFICIENCY, FRACTURES): Vit D, 25-Hydroxy: 11 ng/mL — ABNORMAL LOW (ref 30–100)

## 2016-07-21 LAB — FERRITIN: Ferritin: 171 ng/mL (ref 20–288)

## 2016-07-21 LAB — TSH: TSH: 1.92 mIU/L

## 2016-07-21 NOTE — Progress Notes (Signed)
Subjective:    Patient ID: [name], female    DOB: [birth year], 65 y.o.   MRN: [id number]  HPI  Pt is a 65 yo female who presents to the clinic to establish care.   .. Active Ambulatory Problems    Diagnosis Date Noted  . History of pulmonary embolism 03/20/2014  . Diabetes ([place]) 03/20/2014  . Morbid obesity ([place]) 03/20/2014  . Family history of heart disease 03/20/2014  . Bunion, right foot 03/20/2014  . Frequent headaches 07/20/2016  . Macular degeneration, dry 07/20/2016  . No energy 07/21/2016  . OSA on CPAP 07/21/2016  . Closed fracture of nasal bone 07/21/2016  . Hyperlipidemia 07/21/2016  . Depression 07/21/2016  . Gastroesophageal reflux disease without esophagitis 07/21/2016  . Facial pain 07/21/2016  . Chronic bronchitis ([place]) 07/21/2016  . Bilateral edema of lower extremity 07/21/2016   Resolved Ambulatory Problems    Diagnosis Date Noted  . No Resolved Ambulatory Problems   Past Medical History:  Diagnosis Date  . Anemia   . Anxiety   . Arthritis   . Cataracts, bilateral   . Chronic kidney disease   . Diabetes ([place])   . Family history of coronary artery disease   . Fatty liver   . GERD (gastroesophageal reflux disease)   . Headache   . Morbid obesity ([place])   . Peripheral vascular disease ([place])   . PONV (postoperative nausea and vomiting)   . Pulmonary embolism ([place]) 2001  . Restless leg syndrome   . Shortness of breath dyspnea   . Sleep apnea   . Venous stasis dermatitis of both lower extremities    .[name] Kitchen Family History  Problem Relation Age of Onset  . Heart attack Mother   . Diabetes Mother   . Hyperlipidemia Mother   . Hypertension Mother   . Heart attack Father   . Hyperlipidemia Father   . Hypertension Father   . Renal Disease Sister   . Heart failure Sister   . Parkinsonism Brother   . Hypertension Sister   . Thyroid disease Sister   . Crohn's disease Sister   . Cancer Maternal Grandmother   . Cancer Maternal Grandfather    . Stroke Paternal Grandfather    .[name] Kitchen Social History   Social History  . Marital status: Widowed    Spouse name: N/A  . Number of children: N/A  . Years of education: N/A   Occupational History  . Not on file.   Social History Main Topics  . Smoking status: Never Smoker  . Smokeless tobacco: Never Used  . Alcohol use No  . Drug use: No  . Sexual activity: No   Other Topics Concern  . Not on file   Social History Narrative  . No narrative on file   Pt comes in with most recent labs. She does not need any refills.   She is currently recovering from fall in June where she fractured her nasal bone. She is seeing ENT for repair. She has had daily headaches since fall. She is on topamax. She is seeing a neurologist. MRI was ordered and waiting to hear results. She continues to have facial pain. cymbalta helps some.   She has no energy. She admits worsened since not being able to use CPAP due to nasal fracture and now she still cannot breathe through her nose. She takes vitamin D daily.       Review of Systems  All other systems reviewed and are negative.  Objective:   Physical Exam  Constitutional: She is oriented to person, place, and time. She appears well-developed and well-nourished.  Morbidly obese.   HENT:  Head: Normocephalic and atraumatic.  Cardiovascular: Normal rate, regular rhythm and normal heart sounds.   Pulmonary/Chest: Effort normal and breath sounds normal.  Neurological: She is alert and oriented to person, place, and time.  Psychiatric: She has a normal mood and affect. Her behavior is normal.          Assessment & Plan:  DM type II, controlled with CKD stage III- last a1c  5.3 per records. Last SCr 1.4.  Due for eye exam 07/2016 at [facility].  Per pt has had pneumonia 23.  On statin. On arb.  Recheck in 3 months.   Facial pain/nasal fracture/headaches- followed by ENT and neurology.   Hyperlipidemia/hypertriglyeridemia- TG 188, LDL 73.  Recently switched to lipitor. Will recheck in 3 months.   Bilateral macular degeneration, dry- managed by [facility].   Depression- controlled on celexa daily.   Morbid obesity- discussed staying mobile. Discussed keeping calories under control.   No energy/OSA- discussed not using CPAP could be effecting energy. Hopefully will be able to start using after follow up with ENT. Ordered cbc, vitamin D, TSH and ferritin. Insurance would not pay for b12.   Will call and get mammogram from [facility].

## 2016-07-27 MED ORDER — VITAMIN D (ERGOCALCIFEROL) 1.25 MG (50000 UNIT) PO CAPS: 50000.0000 [IU] | ORAL_CAPSULE | ORAL | 0 refills | Status: DC

## 2016-07-27 NOTE — Addendum Note (Signed)
Addended by: Donella Stade on: 07/27/2016 01:17 PM   Modules accepted: Orders

## 2016-07-28 ENCOUNTER — Other Ambulatory Visit: Payer: Self-pay | Admitting: *Deleted

## 2016-07-28 MED ORDER — GLIPIZIDE 5 MG PO TABS: 5.0000 mg | ORAL_TABLET | Freq: Every day | ORAL | 1 refills | Status: DC

## 2016-07-28 MED ORDER — SPIRONOLACTONE 25 MG PO TABS: 25.0000 mg | ORAL_TABLET | Freq: Every day | ORAL | 1 refills | Status: DC

## 2016-07-30 ENCOUNTER — Ambulatory Visit: Payer: Medicare Other | Admitting: Sports Medicine

## 2016-08-04 ENCOUNTER — Ambulatory Visit (INDEPENDENT_AMBULATORY_CARE_PROVIDER_SITE_OTHER): Payer: Medicare Other | Admitting: Physician Assistant

## 2016-08-04 ENCOUNTER — Encounter: Payer: Self-pay | Admitting: Physician Assistant

## 2016-08-04 VITALS — BP 107/48 | HR 75

## 2016-08-04 DIAGNOSIS — E781 Pure hyperglyceridemia: Secondary | ICD-10-CM | POA: Diagnosis not present

## 2016-08-04 DIAGNOSIS — N183 Chronic kidney disease, stage 3 unspecified: Secondary | ICD-10-CM

## 2016-08-04 DIAGNOSIS — D631 Anemia in chronic kidney disease: Secondary | ICD-10-CM | POA: Insufficient documentation

## 2016-08-04 DIAGNOSIS — G4700 Insomnia, unspecified: Secondary | ICD-10-CM | POA: Insufficient documentation

## 2016-08-04 DIAGNOSIS — R6881 Early satiety: Secondary | ICD-10-CM | POA: Diagnosis not present

## 2016-08-04 DIAGNOSIS — R110 Nausea: Secondary | ICD-10-CM | POA: Diagnosis not present

## 2016-08-04 DIAGNOSIS — R1010 Upper abdominal pain, unspecified: Secondary | ICD-10-CM | POA: Diagnosis not present

## 2016-08-04 DIAGNOSIS — R1310 Dysphagia, unspecified: Secondary | ICD-10-CM | POA: Diagnosis not present

## 2016-08-04 MED ORDER — TRAZODONE HCL 50 MG PO TABS: 25.0000 mg | ORAL_TABLET | Freq: Every evening | ORAL | 1 refills | Status: DC | PRN

## 2016-08-04 MED ORDER — HYDROCODONE-ACETAMINOPHEN 5-325 MG PO TABS: ORAL_TABLET | ORAL | 0 refills | Status: DC

## 2016-08-05 DIAGNOSIS — R1310 Dysphagia, unspecified: Secondary | ICD-10-CM | POA: Diagnosis not present

## 2016-08-05 DIAGNOSIS — K449 Diaphragmatic hernia without obstruction or gangrene: Secondary | ICD-10-CM | POA: Diagnosis not present

## 2016-08-05 DIAGNOSIS — R110 Nausea: Secondary | ICD-10-CM | POA: Diagnosis not present

## 2016-08-05 DIAGNOSIS — K219 Gastro-esophageal reflux disease without esophagitis: Secondary | ICD-10-CM | POA: Diagnosis not present

## 2016-08-05 DIAGNOSIS — R1010 Upper abdominal pain, unspecified: Secondary | ICD-10-CM | POA: Diagnosis not present

## 2016-08-05 LAB — COMPLETE METABOLIC PANEL WITH GFR
ALT: 6 U/L (ref 6–29)
AST: 11 U/L (ref 10–35)
Albumin: 3.4 g/dL — ABNORMAL LOW (ref 3.6–5.1)
Alkaline Phosphatase: 85 U/L (ref 33–130)
BUN: 14 mg/dL (ref 7–25)
CO2: 22 mmol/L (ref 20–31)
Calcium: 8.4 mg/dL — ABNORMAL LOW (ref 8.6–10.4)
Chloride: 108 mmol/L (ref 98–110)
Creat: 1.1 mg/dL — ABNORMAL HIGH (ref 0.50–0.99)
GFR, Est African American: 61 mL/min (ref >=60)
GFR, Est Non African American: 53 mL/min — ABNORMAL LOW (ref >=60)
Glucose, Bld: 67 mg/dL (ref 65–99)
Potassium: 4 mmol/L (ref 3.5–5.3)
Sodium: 139 mmol/L (ref 135–146)
Total Bilirubin: 0.4 mg/dL (ref 0.2–1.2)
Total Protein: 6.3 g/dL (ref 6.1–8.1)

## 2016-08-05 LAB — LIPID PANEL
Cholesterol: 179 mg/dL (ref 125–200)
HDL: 47 mg/dL (ref >=46)
LDL Cholesterol: 93 mg/dL (ref <130)
Total CHOL/HDL Ratio: 3.8 Ratio (ref <=5.0)
Triglycerides: 196 mg/dL — ABNORMAL HIGH (ref <150)
VLDL: 39 mg/dL — ABNORMAL HIGH (ref <30)

## 2016-08-06 DIAGNOSIS — E781 Pure hyperglyceridemia: Secondary | ICD-10-CM | POA: Insufficient documentation

## 2016-08-06 NOTE — Progress Notes (Signed)
   Subjective:    Patient ID: [name], female    DOB: 06-12-1951, 65 y.o.   MRN: [id number]  HPI Patient is a 65 year old female who presents to the clinic to go over recent labs. She is a relatively new patient. We also need to discuss other labs to order. She is actually seen in GI today for her abdominal pain and bloating. She would like something to help her with sleep. She is struggling to go and stay asleep.   Review of Systems  Constitutional: Negative.   HENT: Negative.   Gastrointestinal: Positive for abdominal distention and abdominal pain.  Genitourinary: Negative.        Objective:   Physical Exam  Constitutional: She is oriented to person, place, and time. She appears well-developed and well-nourished.  Morbidly obese.  Cardiovascular: Normal rate, regular rhythm and normal heart sounds.   Pulmonary/Chest: Effort normal and breath sounds normal.  Neurological: She is alert and oriented to person, place, and time.  Psychiatric: She has a normal mood and affect. Her behavior is normal.          Assessment & Plan:  Hypertriglyceridemia/hyperlipidemia-I would like to go ahead and get a lipid level today. Patient is on Lipitor. I like to make sure she is on the most appropriate medications.  CKD stage III-this is been a part of her records. I would like to recheck a kidney function today so that we can see the status of her kidneys.  Insomnia- sent over trazodone 25-50 mg as needed before bedtime. We'll see if this could help her get to sleep. Discussed the good sleep routine. Follow-up in 2 months.

## 2016-08-12 DIAGNOSIS — J449 Chronic obstructive pulmonary disease, unspecified: Secondary | ICD-10-CM | POA: Diagnosis not present

## 2016-08-12 DIAGNOSIS — Z7984 Long term (current) use of oral hypoglycemic drugs: Secondary | ICD-10-CM | POA: Diagnosis not present

## 2016-08-12 DIAGNOSIS — H53149 Visual discomfort, unspecified: Secondary | ICD-10-CM | POA: Diagnosis not present

## 2016-08-12 DIAGNOSIS — Z880 Allergy status to penicillin: Secondary | ICD-10-CM | POA: Diagnosis not present

## 2016-08-12 DIAGNOSIS — H353131 Nonexudative age-related macular degeneration, bilateral, early dry stage: Secondary | ICD-10-CM | POA: Diagnosis not present

## 2016-08-12 DIAGNOSIS — H35363 Drusen (degenerative) of macula, bilateral: Secondary | ICD-10-CM | POA: Diagnosis not present

## 2016-08-12 DIAGNOSIS — H25813 Combined forms of age-related cataract, bilateral: Secondary | ICD-10-CM | POA: Diagnosis not present

## 2016-08-12 DIAGNOSIS — H35362 Drusen (degenerative) of macula, left eye: Secondary | ICD-10-CM | POA: Diagnosis not present

## 2016-08-12 DIAGNOSIS — Z7951 Long term (current) use of inhaled steroids: Secondary | ICD-10-CM | POA: Diagnosis not present

## 2016-08-12 DIAGNOSIS — Z79891 Long term (current) use of opiate analgesic: Secondary | ICD-10-CM | POA: Diagnosis not present

## 2016-08-12 DIAGNOSIS — H1013 Acute atopic conjunctivitis, bilateral: Secondary | ICD-10-CM | POA: Diagnosis not present

## 2016-08-12 DIAGNOSIS — Z881 Allergy status to other antibiotic agents status: Secondary | ICD-10-CM | POA: Diagnosis not present

## 2016-08-12 DIAGNOSIS — Z882 Allergy status to sulfonamides status: Secondary | ICD-10-CM | POA: Diagnosis not present

## 2016-08-12 DIAGNOSIS — Z90710 Acquired absence of both cervix and uterus: Secondary | ICD-10-CM | POA: Diagnosis not present

## 2016-08-12 DIAGNOSIS — R079 Chest pain, unspecified: Secondary | ICD-10-CM | POA: Diagnosis not present

## 2016-08-12 DIAGNOSIS — Z9181 History of falling: Secondary | ICD-10-CM | POA: Diagnosis not present

## 2016-08-12 DIAGNOSIS — H02834 Dermatochalasis of left upper eyelid: Secondary | ICD-10-CM | POA: Diagnosis not present

## 2016-08-12 DIAGNOSIS — H538 Other visual disturbances: Secondary | ICD-10-CM | POA: Diagnosis not present

## 2016-08-12 DIAGNOSIS — H2513 Age-related nuclear cataract, bilateral: Secondary | ICD-10-CM | POA: Diagnosis not present

## 2016-08-12 DIAGNOSIS — E162 Hypoglycemia, unspecified: Secondary | ICD-10-CM | POA: Diagnosis not present

## 2016-08-12 DIAGNOSIS — H02831 Dermatochalasis of right upper eyelid: Secondary | ICD-10-CM | POA: Diagnosis not present

## 2016-08-12 DIAGNOSIS — Z7982 Long term (current) use of aspirin: Secondary | ICD-10-CM | POA: Diagnosis not present

## 2016-08-12 DIAGNOSIS — Z888 Allergy status to other drugs, medicaments and biological substances status: Secondary | ICD-10-CM | POA: Diagnosis not present

## 2016-08-12 DIAGNOSIS — H9190 Unspecified hearing loss, unspecified ear: Secondary | ICD-10-CM | POA: Diagnosis not present

## 2016-08-12 DIAGNOSIS — Z79899 Other long term (current) drug therapy: Secondary | ICD-10-CM | POA: Diagnosis not present

## 2016-08-12 DIAGNOSIS — Z9049 Acquired absence of other specified parts of digestive tract: Secondary | ICD-10-CM | POA: Diagnosis not present

## 2016-08-18 ENCOUNTER — Encounter: Payer: Self-pay | Admitting: Physician Assistant

## 2016-08-20 DIAGNOSIS — Z880 Allergy status to penicillin: Secondary | ICD-10-CM | POA: Diagnosis not present

## 2016-08-20 DIAGNOSIS — K760 Fatty (change of) liver, not elsewhere classified: Secondary | ICD-10-CM | POA: Diagnosis not present

## 2016-08-20 DIAGNOSIS — E1143 Type 2 diabetes mellitus with diabetic autonomic (poly)neuropathy: Secondary | ICD-10-CM | POA: Diagnosis not present

## 2016-08-20 DIAGNOSIS — K3184 Gastroparesis: Secondary | ICD-10-CM | POA: Diagnosis not present

## 2016-08-20 DIAGNOSIS — F419 Anxiety disorder, unspecified: Secondary | ICD-10-CM | POA: Diagnosis not present

## 2016-08-20 DIAGNOSIS — E1122 Type 2 diabetes mellitus with diabetic chronic kidney disease: Secondary | ICD-10-CM | POA: Diagnosis not present

## 2016-08-20 DIAGNOSIS — F329 Major depressive disorder, single episode, unspecified: Secondary | ICD-10-CM | POA: Diagnosis not present

## 2016-08-20 DIAGNOSIS — T182XXA Foreign body in stomach, initial encounter: Secondary | ICD-10-CM | POA: Diagnosis not present

## 2016-08-20 DIAGNOSIS — N183 Chronic kidney disease, stage 3 (moderate): Secondary | ICD-10-CM | POA: Diagnosis not present

## 2016-08-20 DIAGNOSIS — Z9989 Dependence on other enabling machines and devices: Secondary | ICD-10-CM | POA: Diagnosis not present

## 2016-08-20 DIAGNOSIS — E785 Hyperlipidemia, unspecified: Secondary | ICD-10-CM | POA: Diagnosis not present

## 2016-08-20 DIAGNOSIS — Z86718 Personal history of other venous thrombosis and embolism: Secondary | ICD-10-CM | POA: Diagnosis not present

## 2016-08-20 DIAGNOSIS — J449 Chronic obstructive pulmonary disease, unspecified: Secondary | ICD-10-CM | POA: Diagnosis not present

## 2016-08-20 DIAGNOSIS — Z6841 Body Mass Index (BMI) 40.0 and over, adult: Secondary | ICD-10-CM | POA: Diagnosis not present

## 2016-08-20 DIAGNOSIS — Z7984 Long term (current) use of oral hypoglycemic drugs: Secondary | ICD-10-CM | POA: Diagnosis not present

## 2016-08-20 DIAGNOSIS — I129 Hypertensive chronic kidney disease with stage 1 through stage 4 chronic kidney disease, or unspecified chronic kidney disease: Secondary | ICD-10-CM | POA: Diagnosis not present

## 2016-08-20 DIAGNOSIS — K219 Gastro-esophageal reflux disease without esophagitis: Secondary | ICD-10-CM | POA: Diagnosis not present

## 2016-08-20 DIAGNOSIS — Z79899 Other long term (current) drug therapy: Secondary | ICD-10-CM | POA: Diagnosis not present

## 2016-08-20 DIAGNOSIS — Z7982 Long term (current) use of aspirin: Secondary | ICD-10-CM | POA: Diagnosis not present

## 2016-08-20 DIAGNOSIS — G4730 Sleep apnea, unspecified: Secondary | ICD-10-CM | POA: Diagnosis not present

## 2016-08-25 ENCOUNTER — Telehealth: Payer: Self-pay | Admitting: *Deleted

## 2016-08-25 ENCOUNTER — Other Ambulatory Visit: Payer: Self-pay | Admitting: Physician Assistant

## 2016-08-25 MED ORDER — COLCHICINE 0.6 MG PO TABS: ORAL_TABLET | ORAL | 0 refills | Status: DC

## 2016-08-25 NOTE — Telephone Encounter (Signed)
Ok sent colchicine to take 2 tablets now and then repeat in 1hr. No more than 3 days for attack.

## 2016-08-25 NOTE — Telephone Encounter (Signed)
Pt left vm asking if you'd send her something in for gout.  She states that she ate oatmeal a few times last week & that brings on an attack.  She stated that she can't have an injection for it like she did in the past due to her CKD.  Please advise.

## 2016-08-26 ENCOUNTER — Telehealth: Payer: Self-pay | Admitting: Emergency Medicine

## 2016-08-26 NOTE — Progress Notes (Signed)
Spoke with patient and told her rx for colchicine was phoned in to her pharmacy and she should take it according to directions. Patient understands. pak

## 2016-09-13 ENCOUNTER — Telehealth: Payer: Self-pay | Admitting: Physician Assistant

## 2016-09-13 MED ORDER — ALPRAZOLAM 1 MG PO TABS: 1.0000 mg | ORAL_TABLET | Freq: Two times a day (BID) | ORAL | 2 refills | Status: DC | PRN

## 2016-09-13 NOTE — Telephone Encounter (Signed)
Rx written and placed in box for PCP to sign.

## 2016-09-13 NOTE — Telephone Encounter (Signed)
[facility] for xanax with 3 month refill.

## 2016-09-17 ENCOUNTER — Telehealth: Payer: Self-pay | Admitting: *Deleted

## 2016-09-17 NOTE — Telephone Encounter (Signed)
Received a form in which someone initiated PA requesting Dx codes associated with medication

## 2016-09-22 DIAGNOSIS — M25521 Pain in right elbow: Secondary | ICD-10-CM | POA: Diagnosis not present

## 2016-09-22 DIAGNOSIS — M25511 Pain in right shoulder: Secondary | ICD-10-CM | POA: Diagnosis not present

## 2016-09-22 DIAGNOSIS — M79671 Pain in right foot: Secondary | ICD-10-CM | POA: Diagnosis not present

## 2016-10-08 DIAGNOSIS — J342 Deviated nasal septum: Secondary | ICD-10-CM | POA: Diagnosis not present

## 2016-10-08 DIAGNOSIS — S022XXS Fracture of nasal bones, sequela: Secondary | ICD-10-CM | POA: Diagnosis not present

## 2016-10-08 DIAGNOSIS — J3489 Other specified disorders of nose and nasal sinuses: Secondary | ICD-10-CM | POA: Diagnosis not present

## 2016-10-20 ENCOUNTER — Ambulatory Visit: Payer: Medicare Other | Admitting: Physician Assistant

## 2016-11-01 DIAGNOSIS — Z86711 Personal history of pulmonary embolism: Secondary | ICD-10-CM | POA: Diagnosis not present

## 2016-11-01 DIAGNOSIS — S0990XS Unspecified injury of head, sequela: Secondary | ICD-10-CM | POA: Diagnosis not present

## 2016-11-01 DIAGNOSIS — Z8614 Personal history of Methicillin resistant Staphylococcus aureus infection: Secondary | ICD-10-CM | POA: Diagnosis not present

## 2016-11-01 DIAGNOSIS — G4733 Obstructive sleep apnea (adult) (pediatric): Secondary | ICD-10-CM | POA: Diagnosis not present

## 2016-11-01 DIAGNOSIS — F329 Major depressive disorder, single episode, unspecified: Secondary | ICD-10-CM | POA: Diagnosis not present

## 2016-11-01 DIAGNOSIS — G43909 Migraine, unspecified, not intractable, without status migrainosus: Secondary | ICD-10-CM | POA: Diagnosis not present

## 2016-11-01 DIAGNOSIS — E119 Type 2 diabetes mellitus without complications: Secondary | ICD-10-CM | POA: Diagnosis not present

## 2016-11-01 DIAGNOSIS — R51 Headache: Secondary | ICD-10-CM | POA: Diagnosis not present

## 2016-11-01 DIAGNOSIS — Z79899 Other long term (current) drug therapy: Secondary | ICD-10-CM | POA: Diagnosis not present

## 2016-11-04 ENCOUNTER — Other Ambulatory Visit: Payer: Self-pay

## 2016-11-04 MED ORDER — FUROSEMIDE 40 MG PO TABS: 40.0000 mg | ORAL_TABLET | Freq: Every day | ORAL | 1 refills | Status: DC

## 2016-11-04 NOTE — Telephone Encounter (Signed)
[facility] sent a refill request for furosemide 40 mg once daily. This is a historical provider. They are requesting a 90 day supply. Please advise.

## 2016-11-05 ENCOUNTER — Encounter: Payer: Self-pay | Admitting: Physician Assistant

## 2016-11-05 ENCOUNTER — Ambulatory Visit (INDEPENDENT_AMBULATORY_CARE_PROVIDER_SITE_OTHER): Payer: Medicare Other | Admitting: Physician Assistant

## 2016-11-05 VITALS — BP 134/82 | HR 124 | Ht 60.0 in | Wt 305.0 lb

## 2016-11-05 DIAGNOSIS — D509 Iron deficiency anemia, unspecified: Secondary | ICD-10-CM

## 2016-11-05 DIAGNOSIS — R5383 Other fatigue: Secondary | ICD-10-CM | POA: Diagnosis not present

## 2016-11-05 DIAGNOSIS — Z23 Encounter for immunization: Secondary | ICD-10-CM

## 2016-11-05 DIAGNOSIS — E559 Vitamin D deficiency, unspecified: Secondary | ICD-10-CM | POA: Diagnosis not present

## 2016-11-05 DIAGNOSIS — E7800 Pure hypercholesterolemia, unspecified: Secondary | ICD-10-CM

## 2016-11-05 DIAGNOSIS — E119 Type 2 diabetes mellitus without complications: Secondary | ICD-10-CM | POA: Diagnosis not present

## 2016-11-05 DIAGNOSIS — E0842 Diabetes mellitus due to underlying condition with diabetic polyneuropathy: Secondary | ICD-10-CM

## 2016-11-05 DIAGNOSIS — Z79899 Other long term (current) drug therapy: Secondary | ICD-10-CM

## 2016-11-05 LAB — POCT GLYCOSYLATED HEMOGLOBIN (HGB A1C): Hemoglobin A1C: 5

## 2016-11-05 MED ORDER — PRAVASTATIN SODIUM 40 MG PO TABS: 40.0000 mg | ORAL_TABLET | Freq: Every day | ORAL | 5 refills | Status: DC

## 2016-11-05 NOTE — Progress Notes (Signed)
   Subjective:    Patient ID: [name], female    DOB: 1951/11/10, 65 y.o.   MRN: [id number]  HPI  Pt is a 65 yo female who presents to the clinic for DM follow up.   DM- she is not checking her sugars. She has had a few times where she has felt shaky and eating has helped. She denies any wounds or sores on legs. She is taking her medication regularly. For her neuropathy her neurologist increase cymbalta to 120mg  daily. She feels like this could be too high of dose. She feels more jittery and anxious. She stopped lipitor and wants to see if she should add back pravastatin.   She request blood work on anemia to see if she needs an iron infusion.   Review of Systems See HPI.     Objective:   Physical Exam  Constitutional: She is oriented to person, place, and time. She appears well-developed and well-nourished.  HENT:  Head: Normocephalic and atraumatic.  Cardiovascular: Normal rate, regular rhythm and normal heart sounds.   Pulmonary/Chest: Effort normal and breath sounds normal.  Neurological: She is alert and oriented to person, place, and time.  Psychiatric: She has a normal mood and affect. Her behavior is normal.          Assessment & Plan:  [name] KitchenMarland KitchenDiagnoses and all orders for this visit:  Type II diabetes mellitus, well controlled ([place]) -     COMPLETE METABOLIC PANEL WITH GFR -     POCT HgB A1C  Pure hypercholesterolemia -     pravastatin (PRAVACHOL) 40 MG tablet; Take 1 tablet (40 mg total) by mouth daily. -     Lipid panel  Medication management -     COMPLETE METABOLIC PANEL WITH GFR -     IBC panel -     Ferritin -     CBC with Differential/Platelet -     VITAMIN D 25 Hydroxy (Vit-D Deficiency, Fractures) -     B12  Vitamin D deficiency -     VITAMIN D 25 Hydroxy (Vit-D Deficiency, Fractures)  Diabetic polyneuropathy associated with diabetes mellitus due to underlying condition (HCC) -     B12  Iron deficiency anemia, unspecified iron deficiency  anemia type -     IBC panel -     Ferritin -     CBC with Differential/Platelet -     Iron and TIBC  Influenza vaccine needed -     Flu Vaccine QUAD 36+ mos PF IM (Fluarix & Fluzone Quad PF)   .. Lab Results  Component Value Date   HGBA1C 5.0 11/05/2016   A!C is great at 5.0.  On glipizide and metormin.  Discussed cutting back on glipizide but patient is reluctant.  Discussed hypoglycemia. Encouraged small frequent snacks.   Will check lipid level but encouraged her to start back on statIn of some sort to help decrease cardiovascular risk.   Will check b12 due to neuropathy of bilateral feet.  Continue on same medications. If

## 2016-11-05 NOTE — Telephone Encounter (Signed)
This was faxed on 09/20/2016.

## 2016-11-06 LAB — COMPLETE METABOLIC PANEL WITH GFR
ALT: 14 U/L (ref 6–29)
AST: 16 U/L (ref 10–35)
Albumin: 3.6 g/dL (ref 3.6–5.1)
Alkaline Phosphatase: 87 U/L (ref 33–130)
BUN: 21 mg/dL (ref 7–25)
CO2: 20 mmol/L (ref 20–31)
Calcium: 8.6 mg/dL (ref 8.6–10.4)
Chloride: 104 mmol/L (ref 98–110)
Creat: 1.25 mg/dL — ABNORMAL HIGH (ref 0.50–0.99)
GFR, Est African American: 53 mL/min — ABNORMAL LOW (ref >=60)
GFR, Est Non African American: 46 mL/min — ABNORMAL LOW (ref >=60)
Glucose, Bld: 74 mg/dL (ref 65–99)
Potassium: 4.3 mmol/L (ref 3.5–5.3)
Sodium: 138 mmol/L (ref 135–146)
Total Bilirubin: 0.3 mg/dL (ref 0.2–1.2)
Total Protein: 7.1 g/dL (ref 6.1–8.1)

## 2016-11-06 LAB — CBC WITH DIFFERENTIAL/PLATELET
Basophils Absolute: 0 cells/uL (ref 0–200)
Basophils Relative: 0 %
Eosinophils Absolute: 144 cells/uL (ref 15–500)
Eosinophils Relative: 2 %
HCT: 32.6 % — ABNORMAL LOW (ref 35.0–45.0)
Hemoglobin: 10.2 g/dL — ABNORMAL LOW (ref 11.7–15.5)
Lymphocytes Relative: 16 %
Lymphs Abs: 1152 cells/uL (ref 850–3900)
MCH: 29.7 pg (ref 27.0–33.0)
MCHC: 31.3 g/dL — ABNORMAL LOW (ref 32.0–36.0)
MCV: 94.8 fL (ref 80.0–100.0)
MPV: 11.2 fL (ref 7.5–12.5)
Monocytes Absolute: 288 cells/uL (ref 200–950)
Monocytes Relative: 4 %
Neutro Abs: 5616 cells/uL (ref 1500–7800)
Neutrophils Relative %: 78 %
Platelets: 181 10*3/uL (ref 140–400)
RBC: 3.44 MIL/uL — ABNORMAL LOW (ref 3.80–5.10)
RDW: 14.8 % (ref 11.0–15.0)
WBC: 7.2 10*3/uL (ref 3.8–10.8)

## 2016-11-06 LAB — IRON AND TIBC
%SAT: 19 % (ref 11–50)
Iron: 44 ug/dL — ABNORMAL LOW (ref 45–160)
TIBC: 237 ug/dL — ABNORMAL LOW (ref 250–450)
UIBC: 193 ug/dL (ref 125–400)

## 2016-11-06 LAB — VITAMIN D 25 HYDROXY (VIT D DEFICIENCY, FRACTURES): Vit D, 25-Hydroxy: 16 ng/mL — ABNORMAL LOW (ref 30–100)

## 2016-11-06 LAB — LIPID PANEL
Cholesterol: 241 mg/dL — ABNORMAL HIGH (ref <200)
HDL: 44 mg/dL — ABNORMAL LOW (ref >50)
LDL Cholesterol: 145 mg/dL — ABNORMAL HIGH (ref <100)
Total CHOL/HDL Ratio: 5.5 Ratio — ABNORMAL HIGH (ref <5.0)
Triglycerides: 260 mg/dL — ABNORMAL HIGH (ref <150)
VLDL: 52 mg/dL — ABNORMAL HIGH (ref <30)

## 2016-11-06 LAB — FERRITIN: Ferritin: 142 ng/mL (ref 20–288)

## 2016-11-06 LAB — VITAMIN B12: Vitamin B-12: 393 pg/mL (ref 200–1100)

## 2016-11-07 ENCOUNTER — Encounter: Payer: Self-pay | Admitting: Physician Assistant

## 2016-11-07 DIAGNOSIS — E559 Vitamin D deficiency, unspecified: Secondary | ICD-10-CM | POA: Insufficient documentation

## 2016-11-07 DIAGNOSIS — E0842 Diabetes mellitus due to underlying condition with diabetic polyneuropathy: Secondary | ICD-10-CM | POA: Insufficient documentation

## 2016-11-08 ENCOUNTER — Other Ambulatory Visit: Payer: Self-pay | Admitting: Physician Assistant

## 2016-11-08 NOTE — Progress Notes (Signed)
I think it was sent but ok to given 6 month supply.

## 2016-11-16 ENCOUNTER — Other Ambulatory Visit: Payer: Self-pay

## 2016-11-16 NOTE — Telephone Encounter (Signed)
Ok to fill for 3 months?

## 2016-11-16 NOTE — Telephone Encounter (Signed)
Latina called and left a message requesting medication refill for potassium and Celexa. Both are historical provider. Please advise.

## 2016-11-17 DIAGNOSIS — M545 Low back pain: Secondary | ICD-10-CM | POA: Diagnosis not present

## 2016-11-17 DIAGNOSIS — M79672 Pain in left foot: Secondary | ICD-10-CM | POA: Diagnosis not present

## 2016-11-17 MED ORDER — POTASSIUM CHLORIDE CRYS ER 20 MEQ PO TBCR: 10.0000 meq | EXTENDED_RELEASE_TABLET | Freq: Every day | ORAL | 0 refills | Status: DC

## 2016-11-17 MED ORDER — CITALOPRAM HYDROBROMIDE 40 MG PO TABS: 40.0000 mg | ORAL_TABLET | Freq: Every day | ORAL | 0 refills | Status: DC

## 2016-11-17 NOTE — Telephone Encounter (Signed)
Pt advised of Rx's refilled.

## 2016-11-25 DIAGNOSIS — J342 Deviated nasal septum: Secondary | ICD-10-CM | POA: Diagnosis not present

## 2016-11-25 DIAGNOSIS — D509 Iron deficiency anemia, unspecified: Secondary | ICD-10-CM | POA: Diagnosis not present

## 2016-11-25 DIAGNOSIS — Z7982 Long term (current) use of aspirin: Secondary | ICD-10-CM | POA: Diagnosis not present

## 2016-11-25 DIAGNOSIS — N183 Chronic kidney disease, stage 3 (moderate): Secondary | ICD-10-CM | POA: Diagnosis not present

## 2016-11-25 DIAGNOSIS — K5900 Constipation, unspecified: Secondary | ICD-10-CM | POA: Diagnosis not present

## 2016-11-25 DIAGNOSIS — Z888 Allergy status to other drugs, medicaments and biological substances status: Secondary | ICD-10-CM | POA: Diagnosis not present

## 2016-11-25 DIAGNOSIS — J449 Chronic obstructive pulmonary disease, unspecified: Secondary | ICD-10-CM | POA: Diagnosis not present

## 2016-11-25 DIAGNOSIS — E538 Deficiency of other specified B group vitamins: Secondary | ICD-10-CM | POA: Diagnosis not present

## 2016-11-25 DIAGNOSIS — F419 Anxiety disorder, unspecified: Secondary | ICD-10-CM | POA: Diagnosis not present

## 2016-11-25 DIAGNOSIS — Z880 Allergy status to penicillin: Secondary | ICD-10-CM | POA: Diagnosis not present

## 2016-11-25 DIAGNOSIS — I129 Hypertensive chronic kidney disease with stage 1 through stage 4 chronic kidney disease, or unspecified chronic kidney disease: Secondary | ICD-10-CM | POA: Diagnosis not present

## 2016-11-25 DIAGNOSIS — N3941 Urge incontinence: Secondary | ICD-10-CM | POA: Diagnosis not present

## 2016-11-25 DIAGNOSIS — K760 Fatty (change of) liver, not elsewhere classified: Secondary | ICD-10-CM | POA: Diagnosis not present

## 2016-11-25 DIAGNOSIS — K219 Gastro-esophageal reflux disease without esophagitis: Secondary | ICD-10-CM | POA: Diagnosis not present

## 2016-11-25 DIAGNOSIS — G2581 Restless legs syndrome: Secondary | ICD-10-CM | POA: Diagnosis not present

## 2016-11-25 DIAGNOSIS — E785 Hyperlipidemia, unspecified: Secondary | ICD-10-CM | POA: Diagnosis not present

## 2016-11-25 DIAGNOSIS — H3530 Unspecified macular degeneration: Secondary | ICD-10-CM | POA: Diagnosis not present

## 2016-11-25 DIAGNOSIS — E1122 Type 2 diabetes mellitus with diabetic chronic kidney disease: Secondary | ICD-10-CM | POA: Diagnosis not present

## 2016-11-25 DIAGNOSIS — Z9049 Acquired absence of other specified parts of digestive tract: Secondary | ICD-10-CM | POA: Diagnosis not present

## 2016-11-25 DIAGNOSIS — G4733 Obstructive sleep apnea (adult) (pediatric): Secondary | ICD-10-CM | POA: Diagnosis not present

## 2016-11-25 DIAGNOSIS — M109 Gout, unspecified: Secondary | ICD-10-CM | POA: Diagnosis not present

## 2016-11-25 DIAGNOSIS — F39 Unspecified mood [affective] disorder: Secondary | ICD-10-CM | POA: Diagnosis not present

## 2016-11-25 DIAGNOSIS — J3489 Other specified disorders of nose and nasal sinuses: Secondary | ICD-10-CM | POA: Diagnosis not present

## 2016-11-25 DIAGNOSIS — Z882 Allergy status to sulfonamides status: Secondary | ICD-10-CM | POA: Diagnosis not present

## 2016-11-26 DIAGNOSIS — E1122 Type 2 diabetes mellitus with diabetic chronic kidney disease: Secondary | ICD-10-CM | POA: Diagnosis not present

## 2016-11-26 DIAGNOSIS — J3489 Other specified disorders of nose and nasal sinuses: Secondary | ICD-10-CM | POA: Diagnosis not present

## 2016-11-26 DIAGNOSIS — J342 Deviated nasal septum: Secondary | ICD-10-CM | POA: Diagnosis not present

## 2016-11-26 DIAGNOSIS — I129 Hypertensive chronic kidney disease with stage 1 through stage 4 chronic kidney disease, or unspecified chronic kidney disease: Secondary | ICD-10-CM | POA: Diagnosis not present

## 2016-11-26 DIAGNOSIS — G4733 Obstructive sleep apnea (adult) (pediatric): Secondary | ICD-10-CM | POA: Diagnosis not present

## 2016-11-26 DIAGNOSIS — J449 Chronic obstructive pulmonary disease, unspecified: Secondary | ICD-10-CM | POA: Diagnosis not present

## 2017-01-04 ENCOUNTER — Other Ambulatory Visit: Payer: Self-pay | Admitting: Physician Assistant

## 2017-01-06 ENCOUNTER — Other Ambulatory Visit: Payer: Self-pay | Admitting: *Deleted

## 2017-01-06 MED ORDER — OMEPRAZOLE 40 MG PO CPDR: 40.0000 mg | DELAYED_RELEASE_CAPSULE | Freq: Every day | ORAL | 3 refills | Status: DC

## 2017-01-11 DIAGNOSIS — I10 Essential (primary) hypertension: Secondary | ICD-10-CM | POA: Diagnosis not present

## 2017-01-11 DIAGNOSIS — E785 Hyperlipidemia, unspecified: Secondary | ICD-10-CM | POA: Diagnosis not present

## 2017-01-11 DIAGNOSIS — E119 Type 2 diabetes mellitus without complications: Secondary | ICD-10-CM | POA: Diagnosis not present

## 2017-01-11 DIAGNOSIS — J449 Chronic obstructive pulmonary disease, unspecified: Secondary | ICD-10-CM | POA: Diagnosis not present

## 2017-01-11 DIAGNOSIS — Z90710 Acquired absence of both cervix and uterus: Secondary | ICD-10-CM | POA: Diagnosis not present

## 2017-01-11 DIAGNOSIS — R21 Rash and other nonspecific skin eruption: Secondary | ICD-10-CM | POA: Diagnosis not present

## 2017-01-11 DIAGNOSIS — T7840XA Allergy, unspecified, initial encounter: Secondary | ICD-10-CM | POA: Diagnosis not present

## 2017-01-11 LAB — BASIC METABOLIC PANEL
BUN: 21 mg/dL (ref 4–21)
Creatinine: 1.3 mg/dL — AB (ref 0.5–1.1)
Glucose: 129 mg/dL
Potassium: 4.7 mmol/L (ref 3.4–5.3)
Sodium: 138 mmol/L (ref 137–147)

## 2017-01-11 LAB — CBC AND DIFFERENTIAL
HCT: 33 % — AB (ref 36–46)
Hemoglobin: 10.7 g/dL — AB (ref 12.0–16.0)
Platelets: 182 10*3/uL (ref 150–399)
WBC: 9.4 10^3/mL

## 2017-01-14 ENCOUNTER — Telehealth: Payer: Self-pay

## 2017-01-14 MED ORDER — PREDNISONE 50 MG PO TABS: 50.0000 mg | ORAL_TABLET | Freq: Every day | ORAL | 0 refills | Status: DC

## 2017-01-14 NOTE — Telephone Encounter (Signed)
Patient advised.

## 2017-01-14 NOTE — Telephone Encounter (Signed)
Ok to send prednisone 50mg  one tablet for 5 days. #5 NRF. Follow up with ortho.

## 2017-01-14 NOTE — Telephone Encounter (Signed)
[name] called and states she has a flare up of joint pain and swelling in her right foot. She states the swelling is not gout. She had to cancel her appointment with Ortho this week due to weather. She wants a prescription for prednisone. Denies fever, chills or sweats. Please advise.

## 2017-01-14 NOTE — Telephone Encounter (Signed)
Medication sent. Left message advising of the medication.

## 2017-01-26 DIAGNOSIS — M25511 Pain in right shoulder: Secondary | ICD-10-CM | POA: Diagnosis not present

## 2017-01-28 DIAGNOSIS — D229 Melanocytic nevi, unspecified: Secondary | ICD-10-CM | POA: Diagnosis not present

## 2017-01-28 DIAGNOSIS — I8310 Varicose veins of unspecified lower extremity with inflammation: Secondary | ICD-10-CM | POA: Diagnosis not present

## 2017-01-28 DIAGNOSIS — M793 Panniculitis, unspecified: Secondary | ICD-10-CM | POA: Diagnosis not present

## 2017-01-28 DIAGNOSIS — L821 Other seborrheic keratosis: Secondary | ICD-10-CM | POA: Diagnosis not present

## 2017-01-28 DIAGNOSIS — R21 Rash and other nonspecific skin eruption: Secondary | ICD-10-CM | POA: Diagnosis not present

## 2017-01-28 DIAGNOSIS — L738 Other specified follicular disorders: Secondary | ICD-10-CM | POA: Diagnosis not present

## 2017-01-28 DIAGNOSIS — D1800 Hemangioma unspecified site: Secondary | ICD-10-CM | POA: Diagnosis not present

## 2017-02-03 ENCOUNTER — Other Ambulatory Visit: Payer: Self-pay

## 2017-02-03 MED ORDER — METFORMIN HCL 1000 MG PO TABS: 1000.0000 mg | ORAL_TABLET | Freq: Two times a day (BID) | ORAL | 0 refills | Status: DC

## 2017-02-04 ENCOUNTER — Encounter: Payer: Self-pay | Admitting: Physician Assistant

## 2017-02-04 ENCOUNTER — Encounter: Payer: Self-pay | Admitting: *Deleted

## 2017-02-04 ENCOUNTER — Ambulatory Visit: Payer: Medicare Other | Admitting: *Deleted

## 2017-02-04 ENCOUNTER — Ambulatory Visit (INDEPENDENT_AMBULATORY_CARE_PROVIDER_SITE_OTHER): Payer: Medicare Other | Admitting: Physician Assistant

## 2017-02-04 VITALS — BP 136/71 | HR 108 | Ht 60.0 in | Wt 303.0 lb

## 2017-02-04 VITALS — BP 136/71 | HR 94 | Temp 97.4°F | Ht 60.0 in | Wt 303.0 lb

## 2017-02-04 DIAGNOSIS — E538 Deficiency of other specified B group vitamins: Secondary | ICD-10-CM

## 2017-02-04 DIAGNOSIS — G2581 Restless legs syndrome: Secondary | ICD-10-CM

## 2017-02-04 DIAGNOSIS — I10 Essential (primary) hypertension: Secondary | ICD-10-CM

## 2017-02-04 DIAGNOSIS — G8929 Other chronic pain: Secondary | ICD-10-CM

## 2017-02-04 DIAGNOSIS — E7800 Pure hypercholesterolemia, unspecified: Secondary | ICD-10-CM | POA: Diagnosis not present

## 2017-02-04 DIAGNOSIS — M25511 Pain in right shoulder: Secondary | ICD-10-CM | POA: Diagnosis not present

## 2017-02-04 DIAGNOSIS — D509 Iron deficiency anemia, unspecified: Secondary | ICD-10-CM | POA: Diagnosis not present

## 2017-02-04 DIAGNOSIS — E559 Vitamin D deficiency, unspecified: Secondary | ICD-10-CM | POA: Diagnosis not present

## 2017-02-04 DIAGNOSIS — F419 Anxiety disorder, unspecified: Secondary | ICD-10-CM | POA: Diagnosis not present

## 2017-02-04 DIAGNOSIS — M79642 Pain in left hand: Secondary | ICD-10-CM | POA: Diagnosis not present

## 2017-02-04 DIAGNOSIS — Z13820 Encounter for screening for osteoporosis: Secondary | ICD-10-CM | POA: Diagnosis not present

## 2017-02-04 DIAGNOSIS — Z780 Asymptomatic menopausal state: Secondary | ICD-10-CM | POA: Diagnosis not present

## 2017-02-04 DIAGNOSIS — E119 Type 2 diabetes mellitus without complications: Secondary | ICD-10-CM | POA: Diagnosis not present

## 2017-02-04 LAB — POCT GLYCOSYLATED HEMOGLOBIN (HGB A1C): Hemoglobin A1C: 5.4

## 2017-02-04 MED ORDER — PRAVASTATIN SODIUM 40 MG PO TABS: 40.0000 mg | ORAL_TABLET | Freq: Every day | ORAL | 5 refills | Status: DC

## 2017-02-04 MED ORDER — GABAPENTIN 100 MG PO CAPS: 100.0000 mg | ORAL_CAPSULE | Freq: Three times a day (TID) | ORAL | 1 refills | Status: DC

## 2017-02-04 MED ORDER — VITAMIN D (ERGOCALCIFEROL) 1.25 MG (50000 UNIT) PO CAPS: 50000.0000 [IU] | ORAL_CAPSULE | ORAL | 2 refills | Status: DC

## 2017-02-04 MED ORDER — SPIRONOLACTONE 25 MG PO TABS: 25.0000 mg | ORAL_TABLET | Freq: Every day | ORAL | 1 refills | Status: DC

## 2017-02-04 MED ORDER — LOSARTAN POTASSIUM 25 MG PO TABS: 25.0000 mg | ORAL_TABLET | Freq: Every day | ORAL | 1 refills | Status: DC

## 2017-02-04 MED ORDER — METFORMIN HCL 1000 MG PO TABS: 1000.0000 mg | ORAL_TABLET | Freq: Two times a day (BID) | ORAL | 2 refills | Status: DC

## 2017-02-04 MED ORDER — ALPRAZOLAM 1 MG PO TABS: 1.0000 mg | ORAL_TABLET | Freq: Two times a day (BID) | ORAL | 2 refills | Status: DC | PRN

## 2017-02-04 MED ORDER — GLIPIZIDE 5 MG PO TABS: ORAL_TABLET | ORAL | 1 refills | Status: DC

## 2017-02-04 MED ORDER — HYDROCODONE-ACETAMINOPHEN 5-325 MG PO TABS: 1.0000 | ORAL_TABLET | Freq: Three times a day (TID) | ORAL | 0 refills | Status: DC | PRN

## 2017-02-04 MED ORDER — BUDESONIDE-FORMOTEROL FUMARATE 160-4.5 MCG/ACT IN AERO: 2.0000 | INHALATION_SPRAY | Freq: Two times a day (BID) | RESPIRATORY_TRACT | 11 refills | Status: DC

## 2017-02-04 MED ORDER — FLUCONAZOLE 150 MG PO TABS: 150.0000 mg | ORAL_TABLET | Freq: Once | ORAL | 0 refills | Status: AC

## 2017-02-04 NOTE — Progress Notes (Signed)
Subjective:    Patient ID: [name], female    DOB: [birth year], 66 y.o.   MRN: [id number]  HPI   Pt is a 66 yo female who presents to the clinic to follow up on DM. She is not checking her sugars. She denies any hypoglycemia. Denies any leg wounds or open sores. She is having bilateral numbness and tingling that is worse at night. She is being treated for suppurative folliculitis by dermatology of her legs with oral doxycycline and clindamycin lotion.   Pt is seeing orthopedist for right shoulder pain and has had a arthroscopic surgery almost a year ago. She continues to have pain. Requesting narcotic. Orthopedist will not prescribe she would like to be a ble to take at least one a day.   She has also having some left dorsal hand pain. She has notice a knot on her dorsal hand. She does not remember any trauma. Noticed for the last month. Not tried anything to make better. Seems to be getting worse. She was not able to open a jar the other day. Has ganglion cyst of right dorsal hand.   Per pt needs bone density.   Review of Systems  All other systems reviewed and are negative.      Objective:   Physical Exam  Constitutional: She is oriented to person, place, and time. She appears well-developed and well-nourished.  Morbid obesity.   HENT:  Head: Normocephalic and atraumatic.  Cardiovascular: Normal rate, regular rhythm and normal heart sounds.   Pulmonary/Chest: Effort normal and breath sounds normal.  Musculoskeletal:  Full ROM of bilateral wrist.  Strength left wrist 4/5.  Tenderness over dorsal left wrist. No palpable knot.   Neurological: She is alert and oriented to person, place, and time.  Psychiatric: She has a normal mood and affect. Her behavior is normal.          Assessment & Plan:  [name] KitchenMarland KitchenDoris was seen today for diabetes.  Diagnoses and all orders for this visit:  Type II diabetes mellitus, well controlled ([place]) -     POCT HgB A1C -     glipiZIDE  (GLUCOTROL) 5 MG tablet; TAKE ONE TABLET BY MOUTH ONCE DAILY BEFORE BREAKFAST -     metFORMIN (GLUCOPHAGE) 1000 MG tablet; Take 1 tablet (1,000 mg total) by mouth 2 (two) times daily with a meal. -     COMPLETE METABOLIC PANEL WITH GFR  Pure hypercholesterolemia -     pravastatin (PRAVACHOL) 40 MG tablet; Take 1 tablet (40 mg total) by mouth daily. -     Lipid Profile  Left hand pain  B12 deficiency -     B12  Vitamin D deficiency -     Vitamin D (25 hydroxy)  Iron deficiency anemia, unspecified iron deficiency anemia type -     CBC with Differential  Osteoporosis screening -     DG Bone Density; Future  Post-menopausal -     DG Bone Density; Future  Anxiety -     ALPRAZolam (XANAX) 1 MG tablet; Take 1 tablet (1 mg total) by mouth 2 (two) times daily as needed for anxiety.  Essential hypertension -     losartan (COZAAR) 25 MG tablet; Take 1 tablet (25 mg total) by mouth daily. -     spironolactone (ALDACTONE) 25 MG tablet; Take 1 tablet (25 mg total) by mouth daily.  RLS (restless legs syndrome) -     gabapentin (NEURONTIN) 100 MG capsule; Take 1 capsule (100 mg total)  by mouth 3 (three) times daily.  Chronic right shoulder pain -     HYDROcodone-acetaminophen (NORCO/VICODIN) 5-325 MG tablet; Take 1 tablet by mouth every 8 (eight) hours as needed for moderate pain.  Other orders -     budesonide-formoterol (SYMBICORT) 160-4.5 MCG/ACT inhaler; Inhale 2 puffs into the lungs 2 (two) times daily. -     Vitamin D, Ergocalciferol, (DRISDOL) 50000 units CAPS capsule; Take 1 capsule (50,000 Units total) by mouth once a week. -     fluconazole (DIFLUCAN) 150 MG tablet; Take 1 tablet (150 mg total) by mouth once. Can take 2nd dose in 72 hrs from 1st dose if still having symptoms.  A1C is 5.4 still controlled.  Encouraged annual eye exam.  Flu shot up to date.  Discussed pneumococcal disease. Pt declined today.    Opoid risk tool low risk.  [place] controlled substance database  reviewed with no concerns. Agreed to give 30 tablets a month for chronic right shoulder pain.   Gabapentin started for RLS symptoms. Follow up in 1 month.   History of IDA. Request recheck. She has previously been followed by Dr. Georgiann Cocker in [place].   Left hand pain schedule with Dr. Georgina Snell. Sounds like could be a ganglion cyst forming or could be some focal tendonitis.

## 2017-02-04 NOTE — Progress Notes (Signed)
Subjective:   [name] is a 66 y.o. female who presents for an Initial Medicare Annual Wellness Visit.  Review of Systems      Cardiac Risk Factors include: advanced age (>3men, >51 women);diabetes mellitus;dyslipidemia;hypertension;obesity (BMI >30kg/m2);sedentary lifestyle     Objective:    Today's Vitals   02/04/17 1037 02/04/17 1112  BP: 136/71   Pulse: 94   Temp: 97.4 F (36.3 C)   TempSrc: Oral   SpO2: 98%   Weight: (!) 303 lb (137.4 kg)   Height: 5' (1.524 m)   PainSc:  7    Body mass index is 59.18 kg/m.   Current Medications (verified) Outpatient Encounter Prescriptions as of 02/04/2017  Medication Sig  . albuterol (PROVENTIL HFA;VENTOLIN HFA) 108 (90 BASE) MCG/ACT inhaler Inhale 1-2 puffs into the lungs every 6 (six) hours as needed for wheezing or shortness of breath.  [name] Kitchen aspirin 81 MG tablet Take 81 mg by mouth daily.  . budesonide-formoterol (SYMBICORT) 160-4.5 MCG/ACT inhaler Inhale 2 puffs into the lungs 2 (two) times daily.  . cholecalciferol (VITAMIN D) 1000 units tablet Take 1,000 Units by mouth daily.  . citalopram (CELEXA) 40 MG tablet Take 1 tablet (40 mg total) by mouth daily.  [name] Kitchen co-enzyme Q-10 50 MG capsule Take 50 mg by mouth daily.  . colchicine 0.6 MG tablet Take 2 tablets once and then 1 tablet 1 hour later repeat no more than 3 days. For gout outbreak.  . DULoxetine (CYMBALTA) 60 MG capsule 2 po daily  . furosemide (LASIX) 40 MG tablet Take 1 tablet (40 mg total) by mouth daily.  [name] Kitchen gabapentin (NEURONTIN) 100 MG capsule Take 1 capsule (100 mg total) by mouth 3 (three) times daily.  [name] Kitchen glipiZIDE (GLUCOTROL) 5 MG tablet TAKE ONE TABLET BY MOUTH ONCE DAILY BEFORE BREAKFAST  . HYDROcodone-acetaminophen (NORCO/VICODIN) 5-325 MG tablet Take 1 tablet by mouth every 8 (eight) hours as needed for moderate pain.  [name] Kitchen losartan (COZAAR) 25 MG tablet Take 1 tablet (25 mg total) by mouth daily.  . metFORMIN (GLUCOPHAGE) 1000 MG tablet Take 1 tablet  (1,000 mg total) by mouth 2 (two) times daily with a meal.  . omeprazole (PRILOSEC) 40 MG capsule Take 1 capsule (40 mg total) by mouth daily.  . potassium chloride SA (K-DUR,KLOR-CON) 20 MEQ tablet Take 0.5 tablets (10 mEq total) by mouth daily.  . pravastatin (PRAVACHOL) 40 MG tablet Take 1 tablet (40 mg total) by mouth daily.  [name] Kitchen spironolactone (ALDACTONE) 25 MG tablet Take 1 tablet (25 mg total) by mouth daily.  [name] Kitchen topiramate (TOPAMAX) 50 MG tablet Take 200 mg by mouth at bedtime.   . Vitamin D, Ergocalciferol, (DRISDOL) 50000 units CAPS capsule Take 1 capsule (50,000 Units total) by mouth once a week.  [name] Kitchen acetaminophen (TYLENOL) 500 MG tablet Take 1,000 mg by mouth.  . ALPRAZolam (XANAX) 1 MG tablet Take 1 tablet (1 mg total) by mouth 2 (two) times daily as needed for anxiety.  . Multiple Vitamins-Minerals (OCUVITE PO) Take 1 tablet by mouth daily.  . mupirocin ointment (BACTROBAN) 2 % Place 1 application into the nose 3 (three) times daily.  . [DISCONTINUED] ALPRAZolam (XANAX) 1 MG tablet Take 1 tablet (1 mg total) by mouth 2 (two) times daily as needed for anxiety.  . [DISCONTINUED] aspirin EC 81 MG tablet Take 4 tablets (325 mg total) by mouth 2 (two) times daily at 8 am and 10 pm. (Patient taking differently: Take 81 mg by mouth once. )  . [DISCONTINUED]  budesonide-formoterol (SYMBICORT) 160-4.5 MCG/ACT inhaler Inhale 2 puffs into the lungs 2 (two) times daily.  . [DISCONTINUED] glipiZIDE (GLUCOTROL) 5 MG tablet TAKE ONE TABLET BY MOUTH ONCE DAILY BEFORE BREAKFAST  . [DISCONTINUED] losartan (COZAAR) 25 MG tablet Take 25 mg by mouth.  . [DISCONTINUED] metFORMIN (GLUCOPHAGE) 1000 MG tablet Take 1 tablet (1,000 mg total) by mouth 2 (two) times daily with a meal.  . [DISCONTINUED] pravastatin (PRAVACHOL) 40 MG tablet Take 1 tablet (40 mg total) by mouth daily.  . [DISCONTINUED] predniSONE (DELTASONE) 50 MG tablet Take 1 tablet (50 mg total) by mouth daily with breakfast.  . [DISCONTINUED]  senna-docusate (SENOKOT-S) 8.6-50 MG tablet Take by mouth.  . [DISCONTINUED] spironolactone (ALDACTONE) 25 MG tablet Take 1 tablet (25 mg total) by mouth daily.  . [DISCONTINUED] traZODone (DESYREL) 50 MG tablet Take 0.5-1 tablets (25-50 mg total) by mouth at bedtime as needed for sleep.  . [DISCONTINUED] Vitamin D, Ergocalciferol, (DRISDOL) 50000 units CAPS capsule TAKE ONE CAPSULE BY MOUTH ONCE A WEEK   No facility-administered encounter medications on file as of 02/04/2017.     Allergies (verified) Amlodipine; Penicillins; Lovastatin; Sulfacetamide sodium; Sulfamethoxazole; and Sulfa antibiotics   History: Past Medical History:  Diagnosis Date  . Anemia    hx of low iron  . Anxiety   . Arthritis   . Cataracts, bilateral   . Chronic kidney disease    nephrologist  Dr. Gillian Shields [facility], told pt. she is stage III  . Diabetes ([place])   . Family history of coronary artery disease   . Fatty liver   . GERD (gastroesophageal reflux disease)   . Headache   . Morbid obesity ([place])   . Peripheral vascular disease ([place])   . PONV (postoperative nausea and vomiting)   . Pulmonary embolism ([place]) 2001  . Restless leg syndrome   . Shortness of breath dyspnea   . Sleep apnea    can't wear her cpap due to nose being sore  . Venous stasis dermatitis of both lower extremities    Past Surgical History:  Procedure Laterality Date  . ABDOMINAL HYSTERECTOMY    . APPENDECTOMY    . BUNIONECTOMY Right 01/28/2015   Procedure: Lillard Anes;  Surgeon: Hessie Dibble, MD;  Location: [place];  Service: Orthopedics;  Laterality: Right;  . CARDIAC CATHETERIZATION  2014  . CHOLECYSTECTOMY    . COLONOSCOPY    . COLONOSCOPY    . FRACTURE SURGERY Left    shoulder  . HERNIA REPAIR     umbilical hernia  . JOINT REPLACEMENT Right 2001   knee  . PANNICULECTOMY    . SHOULDER ARTHROSCOPY Right 02/24/2016   Procedure: RIGHT SHOULDER ARTHROSCOPY, ACROMIOPLASTY, DISTAL CLAVICLE RESECTION, DEBRIDEMENT;   Surgeon: Melrose Nakayama, MD;  Location: [place];  Service: Orthopedics;  Laterality: Right;  . TENNIS ELBOW RELEASE/NIRSCHEL PROCEDURE Right 02/24/2016   Procedure: RIGHT ELBOW LATERAL REPAIR;  Surgeon: Melrose Nakayama, MD;  Location: [place];  Service: Orthopedics;  Laterality: Right;   Family History  Problem Relation Age of Onset  . Heart attack Mother   . Diabetes Mother   . Hyperlipidemia Mother   . Hypertension Mother   . Heart attack Father   . Hyperlipidemia Father   . Hypertension Father   . Renal Disease Sister   . Heart failure Sister   . Parkinsonism Brother   . Hypertension Sister   . Thyroid disease Sister   . Crohn's disease Sister   . Cancer Maternal Grandmother   .  Cancer Maternal Grandfather   . Stroke Paternal Grandfather    Social History   Occupational History  . Not on file.   Social History Main Topics  . Smoking status: Never Smoker  . Smokeless tobacco: Never Used  . Alcohol use No  . Drug use: No  . Sexual activity: No    Tobacco Counseling Counseling given: Not Answered Pt. Does not smoke or use any form of tobacco.  Activities of Daily Living In your present state of health, do you have any difficulty performing the following activities: 02/04/2017 02/12/2016  Hearing? N Y  Vision? N N  Difficulty concentrating or making decisions? N N  Walking or climbing stairs? Y Y  Dressing or bathing? N N  Doing errands, shopping? N N  Preparing Food and eating ? N -  Using the Toilet? N -  In the past six months, have you accidently leaked urine? Y -  Do you have problems with loss of bowel control? N -  Managing your Medications? N -  Managing your Finances? N -  Housekeeping or managing your Housekeeping? N -  Some recent data might be hidden    Immunizations and Health Maintenance Immunization History  Administered Date(s) Administered  . Influenza,inj,Quad PF,36+ Mos 11/05/2016   Health Maintenance Due  Topic Date Due  . Hepatitis C  Screening  1951/02/07  . FOOT EXAM  06/13/1962  . OPHTHALMOLOGY EXAM  06/13/1962  . HIV Screening  06/14/1967  . TETANUS/TDAP  06/14/1971  . PAP SMEAR  06/13/1973  . MAMMOGRAM  06/13/2002  . COLONOSCOPY  06/13/2002    Patient Care Team: Donella Stade, PA-C as PCP - General (Family Medicine)  Indicate any recent Medical Services you may have received from other than [facility] providers in the past year (date may be approximate).     Assessment:   This is a routine wellness examination for Mireyah.   Hearing/Vision screen No exam data present  Dietary issues and exercise activities discussed: Current Exercise Habits: The patient does not participate in regular exercise at present, Exercise limited by: cardiac condition(s);orthopedic condition(s);respiratory conditions(s)  Goals      Patient Stated   . Weight (lb) < 200 lb (90.7 kg) (pt-stated)          She would like to lose 25# by her next f/u with [facility] in 3 months.      Depression Screen PHQ 2/9 Scores 02/04/2017  PHQ - 2 Score 2  PHQ- 9 Score 6    Fall Risk Fall Risk  02/04/2017  Falls in the past year? Yes  Number falls in past yr: 1  Injury with Fall? Yes  Risk Factor Category  High Fall Risk  Risk for fall due to : Impaired mobility  Follow up Education provided;Falls prevention discussed    Cognitive Function:     6CIT Screen 02/04/2017  What Year? 0 points  What month? 0 points  What time? 0 points  Count back from 20 0 points  Months in reverse 0 points  Repeat phrase 0 points  Total Score 0    Screening Tests Health Maintenance  Topic Date Due  . Hepatitis C Screening  15-Feb-1951  . FOOT EXAM  06/13/1962  . OPHTHALMOLOGY EXAM  06/13/1962  . HIV Screening  06/14/1967  . TETANUS/TDAP  06/14/1971  . PAP SMEAR  06/13/1973  . MAMMOGRAM  06/13/2002  . COLONOSCOPY  06/13/2002  . ZOSTAVAX  07/20/2026 (Originally 06/13/2012)  . HEMOGLOBIN A1C  05/05/2017  .  INFLUENZA VACCINE  Completed       Plan:    I have personally reviewed and addressed the Medicare Annual Wellness questionnaire and have noted the following in the patient's chart:  A. Medical and social history  C. Current medications and supplements D. Functional ability and status-Requires some assistance from daughter, who comes periodically to do some light housekeeping prn. E.  Nutritional status-Pt would like to lose 25# by her next f/u with PCP and bring her BMI down.  BMI today was 59.18.  F.  Physical activity-Mostly sedentary lifestye due to pain in bilateral legs r/t painful sores, SOB r/t overweight, weakness r/t feeling of tiredness frequently. G. Advance directives I.  Hospitalizations, surgeries, and ER visits in previous 12 months J.  Vitals K. Screenings to include cognitive (Normal Cognitive Function by direct observation and interview with patient.), depression (No noted depression today during interview) L. Referrals and appointments: She had a hysterectomy at the age of 65 and has not had to have a pap test since.  She is due for a colonoscopy, particularly because she stated that she has noticed some chnages in her bowels; she sees a physician at [facility] and has stated that she will make this appt.  She is due for her diabetic eye exam and she stated that she has this scheduled for Monday, 02-07-2017 at [facility] and they will be discussing her possible upcoming cataract surgery at that time as well.  She is due for an upcoming mammogram, which she stated that she has an appt scheduled for 02-14-2017 at the [facility]  She stated that she had a recent tetanus vaccine at [facility] in 2017.  She has agreed to be tested, as advised to have her lifetime Hepatitis C and HIV screen.  She mentioned to me on the phone this afternoon after she left the office, that she also noticed that she had a sore that looked purple and was painful on her right toe.  I advised her that since she is diabetic, she  needs to come and see [name] to have her diabetic foot exam and have her evaluate it, an appt was made to see her at 10 am on 02-08-2017.  An order was put in for her to have a bone density scan as well at [facility].  In addition, I have reviewed and discussed with patient certain preventive protocols, quality metrics, and best practice recommendations. A written personalized care plan for preventive services as well as general preventive health recommendations were provided to patient.  Signed,   Nestor Lewandowsky, RN  I have read and agreed with the contents of this note. Jade Breeback PA-C.

## 2017-02-04 NOTE — Patient Instructions (Addendum)
Health maintenance:Mammogram, DM Foot Exam, DM Eye Exam, Pap Smear, Colonoscopy   Abnormal screenings: None noted.   Patient concerns: Possible low iron level-If iron level drawn today, fee would be out of pocket, which [place] Planas, PA-C's asst., [employer] explained. I advised that if she feels worse or extemely weak, to call the office and request a f/u for possible labs.   Nurse concerns:Weight, Folliculitis on bilateral lower extremities.  Take note if this worsens and call office due to being diabetic.Bone Densitometry Introduction Bone densitometry is an imaging test that uses a special X-ray to measure the amount of calcium and other minerals in your bones (bone density). This test is also known as a bone mineral density test or dual-energy X-ray absorptiometry (DXA). The test can measure bone density at your hip and your spine. It is similar to having a regular X-ray. You may have this test to:  Diagnose a condition that causes weak or thin bones (osteoporosis).  Predict your risk of a broken bone (fracture).  Determine how well osteoporosis treatment is working. Tell a health care provider about:  Any allergies you have.  All medicines you are taking, including vitamins, herbs, eye drops, creams, and over-the-counter medicines.  Any problems you or family members have had with anesthetic medicines.  Any blood disorders you have.  Any surgeries you have had.  Any medical conditions you have.  Possibility of pregnancy.  Any other medical test you had within the previous 14 days that used contrast material. What are the risks? Generally, this is a safe procedure. However, problems can occur and may include the following:  This test exposes you to a very small amount of radiation.  The risks of radiation exposure may be greater to unborn children. What happens before the procedure?  Do not take any calcium supplements for 24 hours before having the test. You can  otherwise eat and drink what you usually do.  Take off all metal jewelry, eyeglasses, dental appliances, and any other metal objects. What happens during the procedure?  You may lie on an exam table. There will be an X-ray generator below you and an imaging device above you.  Other devices, such as boxes or braces, may be used to position your body properly for the scan.  You will need to lie still while the machine slowly scans your body.  The images will show up on a computer monitor. What happens after the procedure? You may need more testing at a later time. This information is not intended to replace advice given to you by your health care provider. Make sure you discuss any questions you have with your health care provider. Document Released: 01/04/2005 Document Revised: 05/20/2016 Document Reviewed: 05/23/2014  2017 Elsevier  Mammogram Introduction A mammogram is an X-ray of the breasts that is done to check for changes that are not normal. This test can screen for and find any changes that may suggest breast cancer. This test can also help to find other changes and variations in the breast. What happens before the procedure? Have this test done about 1-2 weeks after your period. This is usually when your breasts are the least tender. If you are visiting a new doctor or clinic, send any past mammogram images to your new doctor's office. Wash your breasts and under your arms the day of the test. Do not use deodorants, perfumes, lotions, or powders on the day of the test. Take off any jewelry from your neck. Wear clothes  that you can change into and out of easily. What happens during the procedure? You will undress from the waist up. You will put on a gown. You will stand in front of the X-ray machine. Each breast will be placed between two plastic or glass plates. The plates will press down on your breast for a few seconds. Try to stay as relaxed as possible. This does not cause  any harm to your breasts. Any discomfort you feel will be very brief. X-rays will be taken from different angles of each breast. The procedure may vary among doctors and hospitals. What happens after the procedure? The mammogram will be looked at by a specialist (radiologist). You may need to do certain parts of the test again. This depends on the quality of the images. Ask when your test results will be ready. Make sure you get your test results. You may go back to your normal activities. This information is not intended to replace advice given to you by your health care provider. Make sure you discuss any questions you have with your health care provider. Document Released: 03/11/2009 Document Revised: 05/20/2016 Document Reviewed: 02/21/2015  0000000 Elsevier  Folliculitis Folliculitis is redness, soreness, and swelling (inflammation) of the hair follicles. This condition can occur anywhere on the body. People with weakened immune systems, diabetes, or obesity have a greater risk of getting folliculitis. CAUSES  Bacterial infection. This is the most common cause.  Fungal infection.  Viral infection.  Contact with certain chemicals, especially oils and tars. Long-term folliculitis can result from bacteria that live in the nostrils. The bacteria may trigger multiple outbreaks of folliculitis over time. SYMPTOMS Folliculitis most commonly occurs on the scalp, thighs, legs, back, buttocks, and areas where hair is shaved frequently. An early sign of folliculitis is a small, white or yellow, pus-filled, itchy lesion (pustule). These lesions appear on a red, inflamed follicle. They are usually less than 0.2 inches (5 mm) wide. When there is an infection of the follicle that goes deeper, it becomes a boil or furuncle. A group of closely packed boils creates a larger lesion (carbuncle). Carbuncles tend to occur in hairy, sweaty areas of the body. DIAGNOSIS  Your caregiver can usually tell what is  wrong by doing a physical exam. A sample may be taken from one of the lesions and tested in a lab. This can help determine what is causing your folliculitis. TREATMENT  Treatment may include:  Applying warm compresses to the affected areas.  Taking antibiotic medicines orally or applying them to the skin.  Draining the lesions if they contain a large amount of pus or fluid.  Laser hair removal for cases of long-lasting folliculitis. This helps to prevent regrowth of the hair. HOME CARE INSTRUCTIONS  Apply warm compresses to the affected areas as directed by your caregiver.  If antibiotics are prescribed, take them as directed. Finish them even if you start to feel better.  You may take over-the-counter medicines to relieve itching.  Do not shave irritated skin.  Follow up with your caregiver as directed. SEEK IMMEDIATE MEDICAL CARE IF:   You have increasing redness, swelling, or pain in the affected area.  You have a fever. MAKE SURE YOU:  Understand these instructions.  Will watch your condition.  Will get help right away if you are not doing well or get worse. This information is not intended to replace advice given to you by your health care provider. Make sure you discuss any questions you have with  your health care provider. Document Released: 02/21/2002 Document Revised: 01/03/2015 Document Reviewed: 10/03/2015 Elsevier Interactive Patient Education  2017 [employer].    Next PCP appt: 3 months for PCP and 1 year for AWV-S

## 2017-02-04 NOTE — Patient Instructions (Signed)
Dr. Georgina Snell left hand pain.

## 2017-02-05 LAB — COMPLETE METABOLIC PANEL WITH GFR
ALT: 14 U/L (ref 6–29)
AST: 14 U/L (ref 10–35)
Albumin: 3.6 g/dL (ref 3.6–5.1)
Alkaline Phosphatase: 88 U/L (ref 33–130)
BUN: 23 mg/dL (ref 7–25)
CO2: 23 mmol/L (ref 20–31)
Calcium: 8.9 mg/dL (ref 8.6–10.4)
Chloride: 104 mmol/L (ref 98–110)
Creat: 1.31 mg/dL — ABNORMAL HIGH (ref 0.50–0.99)
GFR, Est African American: 50 mL/min — ABNORMAL LOW (ref >=60)
GFR, Est Non African American: 43 mL/min — ABNORMAL LOW (ref >=60)
Glucose, Bld: 102 mg/dL — ABNORMAL HIGH (ref 65–99)
Potassium: 4.6 mmol/L (ref 3.5–5.3)
Sodium: 138 mmol/L (ref 135–146)
Total Bilirubin: 0.4 mg/dL (ref 0.2–1.2)
Total Protein: 7 g/dL (ref 6.1–8.1)

## 2017-02-05 LAB — LIPID PANEL
Cholesterol: 195 mg/dL (ref <200)
HDL: 52 mg/dL (ref >50)
LDL Cholesterol: 94 mg/dL (ref <100)
Total CHOL/HDL Ratio: 3.8 Ratio (ref <5.0)
Triglycerides: 243 mg/dL — ABNORMAL HIGH (ref <150)
VLDL: 49 mg/dL — ABNORMAL HIGH (ref <30)

## 2017-02-05 LAB — CBC WITH DIFFERENTIAL/PLATELET
Basophils Absolute: 0 cells/uL (ref 0–200)
Basophils Relative: 0 %
Eosinophils Absolute: 224 cells/uL (ref 15–500)
Eosinophils Relative: 2 %
HCT: 33.2 % — ABNORMAL LOW (ref 35.0–45.0)
Hemoglobin: 10.6 g/dL — ABNORMAL LOW (ref 11.7–15.5)
Lymphocytes Relative: 18 %
Lymphs Abs: 2016 cells/uL (ref 850–3900)
MCH: 29.5 pg (ref 27.0–33.0)
MCHC: 31.9 g/dL — ABNORMAL LOW (ref 32.0–36.0)
MCV: 92.5 fL (ref 80.0–100.0)
MPV: 10.5 fL (ref 7.5–12.5)
Monocytes Absolute: 448 cells/uL (ref 200–950)
Monocytes Relative: 4 %
Neutro Abs: 8512 cells/uL — ABNORMAL HIGH (ref 1500–7800)
Neutrophils Relative %: 76 %
Platelets: 200 10*3/uL (ref 140–400)
RBC: 3.59 MIL/uL — ABNORMAL LOW (ref 3.80–5.10)
RDW: 15.3 % — ABNORMAL HIGH (ref 11.0–15.0)
WBC: 11.2 10*3/uL — ABNORMAL HIGH (ref 3.8–10.8)

## 2017-02-05 LAB — VITAMIN B12: Vitamin B-12: 437 pg/mL (ref 200–1100)

## 2017-02-05 LAB — VITAMIN D 25 HYDROXY (VIT D DEFICIENCY, FRACTURES): Vit D, 25-Hydroxy: 24 ng/mL — ABNORMAL LOW (ref 30–100)

## 2017-02-06 ENCOUNTER — Encounter: Payer: Self-pay | Admitting: Physician Assistant

## 2017-02-06 DIAGNOSIS — Z780 Asymptomatic menopausal state: Secondary | ICD-10-CM | POA: Insufficient documentation

## 2017-02-06 DIAGNOSIS — M25511 Pain in right shoulder: Secondary | ICD-10-CM

## 2017-02-06 DIAGNOSIS — F419 Anxiety disorder, unspecified: Secondary | ICD-10-CM | POA: Insufficient documentation

## 2017-02-06 DIAGNOSIS — G8929 Other chronic pain: Secondary | ICD-10-CM | POA: Insufficient documentation

## 2017-02-06 DIAGNOSIS — G2581 Restless legs syndrome: Secondary | ICD-10-CM | POA: Insufficient documentation

## 2017-02-06 DIAGNOSIS — I10 Essential (primary) hypertension: Secondary | ICD-10-CM | POA: Insufficient documentation

## 2017-02-06 DIAGNOSIS — M79642 Pain in left hand: Secondary | ICD-10-CM | POA: Insufficient documentation

## 2017-02-07 ENCOUNTER — Ambulatory Visit: Payer: Medicare Other | Admitting: Physician Assistant

## 2017-02-07 DIAGNOSIS — H43393 Other vitreous opacities, bilateral: Secondary | ICD-10-CM | POA: Diagnosis not present

## 2017-02-07 DIAGNOSIS — H25813 Combined forms of age-related cataract, bilateral: Secondary | ICD-10-CM | POA: Diagnosis not present

## 2017-02-07 DIAGNOSIS — H538 Other visual disturbances: Secondary | ICD-10-CM | POA: Diagnosis not present

## 2017-02-07 DIAGNOSIS — R5381 Other malaise: Secondary | ICD-10-CM | POA: Diagnosis not present

## 2017-02-07 DIAGNOSIS — H1013 Acute atopic conjunctivitis, bilateral: Secondary | ICD-10-CM | POA: Diagnosis not present

## 2017-02-07 DIAGNOSIS — H02834 Dermatochalasis of left upper eyelid: Secondary | ICD-10-CM | POA: Diagnosis not present

## 2017-02-07 DIAGNOSIS — R5383 Other fatigue: Secondary | ICD-10-CM | POA: Diagnosis not present

## 2017-02-07 DIAGNOSIS — R51 Headache: Secondary | ICD-10-CM | POA: Diagnosis not present

## 2017-02-07 DIAGNOSIS — H02831 Dermatochalasis of right upper eyelid: Secondary | ICD-10-CM | POA: Diagnosis not present

## 2017-02-07 DIAGNOSIS — E119 Type 2 diabetes mellitus without complications: Secondary | ICD-10-CM | POA: Diagnosis not present

## 2017-02-07 DIAGNOSIS — H35363 Drusen (degenerative) of macula, bilateral: Secondary | ICD-10-CM | POA: Diagnosis not present

## 2017-02-07 DIAGNOSIS — H353131 Nonexudative age-related macular degeneration, bilateral, early dry stage: Secondary | ICD-10-CM | POA: Diagnosis not present

## 2017-02-07 DIAGNOSIS — H2513 Age-related nuclear cataract, bilateral: Secondary | ICD-10-CM | POA: Diagnosis not present

## 2017-02-07 DIAGNOSIS — H53149 Visual discomfort, unspecified: Secondary | ICD-10-CM | POA: Diagnosis not present

## 2017-02-07 DIAGNOSIS — H5710 Ocular pain, unspecified eye: Secondary | ICD-10-CM | POA: Diagnosis not present

## 2017-02-07 NOTE — Progress Notes (Signed)
Call pt: b12 normal range.  Vitamin D still low but improved. What dose of vitamin D are you taking?  Cholesterol looks much better.  Anemia stable at 10.6.  Kidney function stable.

## 2017-02-08 ENCOUNTER — Institutional Professional Consult (permissible substitution): Payer: Medicare Other | Admitting: Family Medicine

## 2017-02-08 ENCOUNTER — Ambulatory Visit: Payer: Medicare Other | Admitting: Physician Assistant

## 2017-02-08 ENCOUNTER — Encounter: Payer: Self-pay | Admitting: Physician Assistant

## 2017-02-11 ENCOUNTER — Encounter: Payer: Self-pay | Admitting: Physician Assistant

## 2017-02-17 ENCOUNTER — Ambulatory Visit (INDEPENDENT_AMBULATORY_CARE_PROVIDER_SITE_OTHER): Payer: Medicare Other | Admitting: Family Medicine

## 2017-02-17 ENCOUNTER — Other Ambulatory Visit: Payer: Medicare Other

## 2017-02-17 ENCOUNTER — Encounter: Payer: Self-pay | Admitting: Family Medicine

## 2017-02-17 ENCOUNTER — Ambulatory Visit (INDEPENDENT_AMBULATORY_CARE_PROVIDER_SITE_OTHER): Payer: Medicare Other

## 2017-02-17 VITALS — BP 115/50 | HR 101 | Temp 98.4°F

## 2017-02-17 DIAGNOSIS — R05 Cough: Secondary | ICD-10-CM | POA: Diagnosis not present

## 2017-02-17 DIAGNOSIS — R0602 Shortness of breath: Secondary | ICD-10-CM

## 2017-02-17 DIAGNOSIS — R059 Cough, unspecified: Secondary | ICD-10-CM

## 2017-02-17 DIAGNOSIS — J9811 Atelectasis: Secondary | ICD-10-CM | POA: Diagnosis not present

## 2017-02-17 DIAGNOSIS — I517 Cardiomegaly: Secondary | ICD-10-CM | POA: Diagnosis not present

## 2017-02-17 MED ORDER — BENZONATATE 200 MG PO CAPS: 200.0000 mg | ORAL_CAPSULE | Freq: Three times a day (TID) | ORAL | 0 refills | Status: DC | PRN

## 2017-02-17 MED ORDER — PREDNISONE 10 MG (48) PO TBPK: ORAL_TABLET | Freq: Every day | ORAL | 0 refills | Status: DC

## 2017-02-17 NOTE — Patient Instructions (Signed)
Thank you for coming in today. Continue albuterol.  Take long course of prednisone.  Get labs and xray today.  Recheck with [facility] Tuesday.  Call or go to the emergency room if you get worse, have trouble breathing, have chest pains, or palpitations.    Chronic Obstructive Pulmonary Disease Exacerbation Chronic obstructive pulmonary disease (COPD) is a common lung problem. In COPD, the flow of air from the lungs is limited. COPD exacerbations are times that breathing gets worse and you need extra treatment. Without treatment they can be life threatening. If they happen often, your lungs can become more damaged. If your COPD gets worse, your doctor may treat you with:  Medicines.  Oxygen.  Different ways to clear your airway, such as using a mask. Follow these instructions at home:  Do not smoke.  Avoid tobacco smoke and other things that bother your lungs.  If given, take your antibiotic medicine as told. Finish the medicine even if you start to feel better.  Only take medicines as told by your doctor.  Drink enough fluids to keep your pee (urine) clear or pale yellow (unless your doctor has told you not to).  Use a cool mist machine (vaporizer).  If you use oxygen or a machine that turns liquid medicine into a mist (nebulizer), continue to use them as told.  Keep up with shots (vaccinations) as told by your doctor.  Exercise regularly.  Eat healthy foods.  Keep all doctor visits as told. Get help right away if:  You are very short of breath and it gets worse.  You have trouble talking.  You have bad chest pain.  You have blood in your spit (sputum).  You have a fever.  You keep throwing up (vomiting).  You feel weak, or you pass out (faint).  You feel confused.  You keep getting worse. This information is not intended to replace advice given to you by your health care provider. Make sure you discuss any questions you have with your health care  provider. Document Released: 12/02/2011 Document Revised: 05/20/2016 Document Reviewed: 08/17/2013 Elsevier Interactive Patient Education  2017 [employer].

## 2017-02-17 NOTE — Progress Notes (Signed)
[name] is a 66 y.o. female who presents to [facility]: [facility] today for cough and congestion. Symptoms present for 2 weeks associated with wheezing. Patient denies severe chest pain. She notes she's been using her albuterol inhaler which helps some. She was initially treated with a prednisone course which helped temporarily. Her wheezing and cough worsened after she stopped using the prednisone. She denies any vomiting or diarrhea. She has more fatigue recently as well and is concerned that she is getting sicker.   Past Medical History:  Diagnosis Date  . Anemia    hx of low iron  . Anxiety   . Arthritis   . Cataracts, bilateral   . Chronic kidney disease    nephrologist  Dr. Gillian Shields [facility], told pt. she is stage III  . Diabetes ([place])   . Family history of coronary artery disease   . Fatty liver   . GERD (gastroesophageal reflux disease)   . Headache   . Morbid obesity ([place])   . Peripheral vascular disease ([place])   . PONV (postoperative nausea and vomiting)   . Pulmonary embolism ([place]) 2001  . Restless leg syndrome   . Shortness of breath dyspnea   . Sleep apnea    can't wear her cpap due to nose being sore  . Venous stasis dermatitis of both lower extremities    Past Surgical History:  Procedure Laterality Date  . ABDOMINAL HYSTERECTOMY    . APPENDECTOMY    . BUNIONECTOMY Right 01/28/2015   Procedure: Lillard Anes;  Surgeon: Hessie Dibble, MD;  Location: [facility];  Service: Orthopedics;  Laterality: Right;  . CARDIAC CATHETERIZATION  2014  . CHOLECYSTECTOMY    . COLONOSCOPY    . COLONOSCOPY    . FRACTURE SURGERY Left    shoulder  . HERNIA REPAIR     umbilical hernia  . JOINT REPLACEMENT Right 2001   knee  . PANNICULECTOMY    . SHOULDER ARTHROSCOPY Right 02/24/2016   Procedure: RIGHT SHOULDER ARTHROSCOPY, ACROMIOPLASTY, DISTAL  CLAVICLE RESECTION, DEBRIDEMENT;  Surgeon: Melrose Nakayama, MD;  Location: [facility];  Service: Orthopedics;  Laterality: Right;  . TENNIS ELBOW RELEASE/NIRSCHEL PROCEDURE Right 02/24/2016   Procedure: RIGHT ELBOW LATERAL REPAIR;  Surgeon: Melrose Nakayama, MD;  Location: [facility];  Service: Orthopedics;  Laterality: Right;   Social History  Substance Use Topics  . Smoking status: Never Smoker  . Smokeless tobacco: Never Used  . Alcohol use No   family history includes Cancer in her maternal grandfather and maternal grandmother; Crohn's disease in her sister; Diabetes in her mother; Heart attack in her father and mother; Heart failure in her sister; Hyperlipidemia in her father and mother; Hypertension in her father, mother, and sister; Parkinsonism in her brother; Renal Disease in her sister; Stroke in her paternal grandfather; Thyroid disease in her sister.  ROS as above:  Medications: Current Outpatient Prescriptions  Medication Sig Dispense Refill  . acetaminophen (TYLENOL) 500 MG tablet Take 1,000 mg by mouth.    [name] Kitchen albuterol (PROVENTIL HFA;VENTOLIN HFA) 108 (90 BASE) MCG/ACT inhaler Inhale 1-2 puffs into the lungs every 6 (six) hours as needed for wheezing or shortness of breath.    . ALPRAZolam (XANAX) 1 MG tablet Take 1 tablet (1 mg total) by mouth 2 (two) times daily as needed for anxiety. 60 tablet 2  . aspirin 81 MG tablet Take 81 mg by mouth daily.    . budesonide-formoterol (  SYMBICORT) 160-4.5 MCG/ACT inhaler Inhale 2 puffs into the lungs 2 (two) times daily. 1 Inhaler 11  . cholecalciferol (VITAMIN D) 1000 units tablet Take 1,000 Units by mouth daily.    . citalopram (CELEXA) 40 MG tablet Take 1 tablet (40 mg total) by mouth daily. 90 tablet 0  . co-enzyme Q-10 50 MG capsule Take 50 mg by mouth daily.    . colchicine 0.6 MG tablet Take 2 tablets once and then 1 tablet 1 hour later repeat no more than 3 days. For gout outbreak. 30 tablet 0  . DULoxetine (CYMBALTA) 60 MG capsule 2 po daily     . furosemide (LASIX) 40 MG tablet Take 1 tablet (40 mg total) by mouth daily. 90 tablet 1  . gabapentin (NEURONTIN) 100 MG capsule Take 1 capsule (100 mg total) by mouth 3 (three) times daily. 90 capsule 1  . glipiZIDE (GLUCOTROL) 5 MG tablet TAKE ONE TABLET BY MOUTH ONCE DAILY BEFORE BREAKFAST 90 tablet 1  . HYDROcodone-acetaminophen (NORCO/VICODIN) 5-325 MG tablet Take 1 tablet by mouth every 8 (eight) hours as needed for moderate pain. 30 tablet 0  . losartan (COZAAR) 25 MG tablet Take 1 tablet (25 mg total) by mouth daily. 90 tablet 1  . metFORMIN (GLUCOPHAGE) 1000 MG tablet Take 1 tablet (1,000 mg total) by mouth 2 (two) times daily with a meal. 180 tablet 2  . Multiple Vitamins-Minerals (OCUVITE PO) Take 1 tablet by mouth daily.    . mupirocin ointment (BACTROBAN) 2 % Place 1 application into the nose 3 (three) times daily.    [name] Kitchen omeprazole (PRILOSEC) 40 MG capsule Take 1 capsule (40 mg total) by mouth daily. 90 capsule 3  . potassium chloride SA (K-DUR,KLOR-CON) 20 MEQ tablet Take 0.5 tablets (10 mEq total) by mouth daily. 45 tablet 0  . pravastatin (PRAVACHOL) 40 MG tablet Take 1 tablet (40 mg total) by mouth daily. 30 tablet 5  . spironolactone (ALDACTONE) 25 MG tablet Take 1 tablet (25 mg total) by mouth daily. 90 tablet 1  . topiramate (TOPAMAX) 50 MG tablet Take 200 mg by mouth at bedtime.     . Vitamin D, Ergocalciferol, (DRISDOL) 50000 units CAPS capsule Take 1 capsule (50,000 Units total) by mouth once a week. 4 capsule 2  . benzonatate (TESSALON) 200 MG capsule Take 1 capsule (200 mg total) by mouth 3 (three) times daily as needed for cough. 45 capsule 0  . predniSONE (STERAPRED UNI-PAK 48 TAB) 10 MG (48) TBPK tablet Take by mouth daily. 12 day dosepack po 48 tablet 0   No current facility-administered medications for this visit.    Allergies  Allergen Reactions  . Amlodipine Itching and Rash  . Penicillins Itching, Rash and Hives    Has patient had a PCN reaction causing  immediate rash, facial/tongue/throat swelling, SOB or lightheadedness with hypotension: Yes  . Lovastatin Itching  . Sulfacetamide Sodium Itching  . Sulfamethoxazole Itching  . Sulfa Antibiotics Itching    Health Maintenance Health Maintenance  Topic Date Due  . FOOT EXAM  06/13/1962  . OPHTHALMOLOGY EXAM  06/13/1962  . HIV Screening  06/14/1967  . COLONOSCOPY  06/13/2002  . Hepatitis C Screening  02/06/2027 (Originally [birth year])  . HEMOGLOBIN A1C  08/04/2017  . MAMMOGRAM  02/12/2018  . TETANUS/TDAP  12/27/2025  . INFLUENZA VACCINE  Completed  . PAP SMEAR  Excluded     Exam:  BP (!) 115/50   Pulse (!) 101   Temp 98.4 F (36.9 C) (Oral)  Gen: Morbidly obese and chronically ill-appearing although no acute distress today. HEENT: EOMI,  MMM Lungs: Normal work of breathing. Wheezing present throughout. Prolonged expiratory phase present. Heart: RRR no MRG Abd: NABS, Soft. Nondistended, Nontender Exts: Brisk capillary refill, warm and well perfused. No significant pitting edema noted today.   No results found for this or any previous visit (from the past 72 hour(s)). Dg Chest 2 View  Result Date: 02/17/2017 CLINICAL DATA:  Cough.  Shortness of breath . EXAM: CHEST  2 VIEW COMPARISON:  11/10/2007. FINDINGS: Mediastinum hilar structures normal. Low lung volumes with mild basilar atelectasis. Stable cardiomegaly. No pleural effusion or pneumothorax. IMPRESSION: 1.  Low lung volumes with mild basilar atelectasis. 2.  Stable cardiomegaly. Electronically Signed   By: Marcello Moores  Register   On: 02/17/2017 17:13      Assessment and Plan: 66 y.o. female with continued bronchitis versus COPD exacerbation. 1 use a longer course of prednisone as well as Tessalon Perles to help control cough. We'll obtain limited workup below including chest x-ray and CBC CMP and BNP to evaluate for potential heart failure.  I recommend patient follow-up in a few days with her PCP to recheck these issues. She  may benefit from further workup although she does not seem to be in extremis today. We carefully discussed precautions that would prompt going to the emergency room.   Orders Placed This Encounter  Procedures  . DG Chest 2 View    Order Specific Question:   Reason for exam:    Answer:   Cough, assess intra-thoracic pathology    Order Specific Question:   Preferred imaging location?    Answer:   [facility]  . CBC  . COMPLETE METABOLIC PANEL WITH GFR  . B Nat Peptide   Meds ordered this encounter  Medications  . predniSONE (STERAPRED UNI-PAK 48 TAB) 10 MG (48) TBPK tablet    Sig: Take by mouth daily. 12 day dosepack po    Dispense:  48 tablet    Refill:  0  . benzonatate (TESSALON) 200 MG capsule    Sig: Take 1 capsule (200 mg total) by mouth 3 (three) times daily as needed for cough.    Dispense:  45 capsule    Refill:  0     Discussed warning signs or symptoms. Please see discharge instructions. Patient expresses understanding.

## 2017-02-18 ENCOUNTER — Other Ambulatory Visit: Payer: Medicare Other

## 2017-02-18 LAB — CBC
HCT: 32 % — ABNORMAL LOW (ref 35.0–45.0)
Hemoglobin: 10.4 g/dL — ABNORMAL LOW (ref 11.7–15.5)
MCH: 30 pg (ref 27.0–33.0)
MCHC: 32.5 g/dL (ref 32.0–36.0)
MCV: 92.2 fL (ref 80.0–100.0)
MPV: 10 fL (ref 7.5–12.5)
Platelets: 174 10*3/uL (ref 140–400)
RBC: 3.47 MIL/uL — ABNORMAL LOW (ref 3.80–5.10)
RDW: 15.6 % — ABNORMAL HIGH (ref 11.0–15.0)
WBC: 8.8 10*3/uL (ref 3.8–10.8)

## 2017-02-18 LAB — COMPLETE METABOLIC PANEL WITH GFR
ALT: 14 U/L (ref 6–29)
AST: 16 U/L (ref 10–35)
Albumin: 3.3 g/dL — ABNORMAL LOW (ref 3.6–5.1)
Alkaline Phosphatase: 84 U/L (ref 33–130)
BUN: 25 mg/dL (ref 7–25)
CO2: 21 mmol/L (ref 20–31)
Calcium: 8.4 mg/dL — ABNORMAL LOW (ref 8.6–10.4)
Chloride: 105 mmol/L (ref 98–110)
Creat: 1.38 mg/dL — ABNORMAL HIGH (ref 0.50–0.99)
GFR, Est African American: 47 mL/min — ABNORMAL LOW (ref >=60)
GFR, Est Non African American: 40 mL/min — ABNORMAL LOW (ref >=60)
Glucose, Bld: 100 mg/dL — ABNORMAL HIGH (ref 65–99)
Potassium: 4.3 mmol/L (ref 3.5–5.3)
Sodium: 136 mmol/L (ref 135–146)
Total Bilirubin: 0.4 mg/dL (ref 0.2–1.2)
Total Protein: 6.5 g/dL (ref 6.1–8.1)

## 2017-02-18 LAB — BRAIN NATRIURETIC PEPTIDE: Brain Natriuretic Peptide: 33.1 pg/mL (ref <100)

## 2017-02-25 ENCOUNTER — Other Ambulatory Visit: Payer: Medicare Other

## 2017-02-25 ENCOUNTER — Ambulatory Visit: Payer: Medicare Other | Admitting: Physician Assistant

## 2017-03-01 ENCOUNTER — Encounter: Payer: Self-pay | Admitting: Physician Assistant

## 2017-03-01 ENCOUNTER — Ambulatory Visit (INDEPENDENT_AMBULATORY_CARE_PROVIDER_SITE_OTHER): Payer: Medicare Other

## 2017-03-01 ENCOUNTER — Ambulatory Visit (INDEPENDENT_AMBULATORY_CARE_PROVIDER_SITE_OTHER): Payer: Medicare Other | Admitting: Physician Assistant

## 2017-03-01 VITALS — BP 122/56 | HR 94 | Ht 60.0 in

## 2017-03-01 DIAGNOSIS — J0100 Acute maxillary sinusitis, unspecified: Secondary | ICD-10-CM

## 2017-03-01 DIAGNOSIS — J440 Chronic obstructive pulmonary disease with acute lower respiratory infection: Secondary | ICD-10-CM

## 2017-03-01 DIAGNOSIS — J209 Acute bronchitis, unspecified: Secondary | ICD-10-CM

## 2017-03-01 DIAGNOSIS — M85851 Other specified disorders of bone density and structure, right thigh: Secondary | ICD-10-CM | POA: Diagnosis not present

## 2017-03-01 DIAGNOSIS — Z780 Asymptomatic menopausal state: Secondary | ICD-10-CM

## 2017-03-01 DIAGNOSIS — Z0000 Encounter for general adult medical examination without abnormal findings: Secondary | ICD-10-CM

## 2017-03-01 DIAGNOSIS — M8580 Other specified disorders of bone density and structure, unspecified site: Secondary | ICD-10-CM | POA: Insufficient documentation

## 2017-03-01 DIAGNOSIS — E559 Vitamin D deficiency, unspecified: Secondary | ICD-10-CM

## 2017-03-01 DIAGNOSIS — Z13820 Encounter for screening for osteoporosis: Secondary | ICD-10-CM

## 2017-03-01 MED ORDER — DOXYCYCLINE HYCLATE 100 MG PO CAPS: 100.0000 mg | ORAL_CAPSULE | Freq: Two times a day (BID) | ORAL | 0 refills | Status: DC

## 2017-03-01 MED ORDER — DOXYCYCLINE HYCLATE 100 MG PO TABS: 100.0000 mg | ORAL_TABLET | Freq: Two times a day (BID) | ORAL | 0 refills | Status: DC

## 2017-03-01 NOTE — Progress Notes (Signed)
   Subjective:    Patient ID: [name], female    DOB: 1951/10/15, 66 y.o.   MRN: [id number]  HPI  Pt is a 66 yo female who presents to the clinic with 3 weeks of upper respiratory symptoms. She has been seen twice for symptomatic care. She is havig a lot of sinus pressure. She could not finish the prednisone due to "the shakes" she got on the 3rd day. She continues to cough. She has a lot of sinus pressure and nasal congestion.    Review of Systems  All other systems reviewed and are negative.      Objective:   Physical Exam  Constitutional: She is oriented to person, place, and time. She appears well-developed and well-nourished.  HENT:  Head: Normocephalic and atraumatic.  Right Ear: External ear normal.  Left Ear: External ear normal.  Nose: Nose normal.  Mouth/Throat: Oropharynx is clear and moist.  Tenderness over maxillary sinuses to palpation.   Eyes: Conjunctivae are normal.  Neck: Normal range of motion. Neck supple.  Cardiovascular: Normal rate, regular rhythm and normal heart sounds.   Pulmonary/Chest: Effort normal and breath sounds normal. She has no wheezes.  Lymphadenopathy:    She has no cervical adenopathy.  Neurological: She is alert and oriented to person, place, and time.  Psychiatric: She has a normal mood and affect. Her behavior is normal.          Assessment & Plan:  [name] KitchenMarland KitchenDiagnoses and all orders for this visit:  Acute bronchitis with COPD ([place])  Acute non-recurrent maxillary sinusitis  Other orders -     Discontinue: doxycycline (VIBRA-TABS) 100 MG tablet; Take 1 tablet (100 mg total) by mouth 2 (two) times daily. For 10 days. -     doxycycline (VIBRAMYCIN) 100 MG capsule; Take 1 capsule (100 mg total) by mouth 2 (two) times daily. For 10 days.  pt has been sick for 3 weeks and now appears to have some sinusitis.  Treated with doxy for 10 days. Discussed symptomatic care. Follow up as needed.

## 2017-03-01 NOTE — Progress Notes (Signed)
Pt is osteopenic. Make sure taking vitamin d  at least 2000 units and calcium 1500mg .

## 2017-03-04 ENCOUNTER — Encounter: Payer: Self-pay | Admitting: Physician Assistant

## 2017-03-09 ENCOUNTER — Ambulatory Visit: Payer: Medicare Other | Admitting: Physician Assistant

## 2017-03-10 ENCOUNTER — Encounter: Payer: Self-pay | Admitting: Family Medicine

## 2017-03-10 ENCOUNTER — Ambulatory Visit (INDEPENDENT_AMBULATORY_CARE_PROVIDER_SITE_OTHER): Payer: Medicare Other | Admitting: Family Medicine

## 2017-03-10 VITALS — BP 162/53 | HR 106 | Wt 301.0 lb

## 2017-03-10 DIAGNOSIS — R053 Chronic cough: Secondary | ICD-10-CM

## 2017-03-10 DIAGNOSIS — M7989 Other specified soft tissue disorders: Secondary | ICD-10-CM | POA: Diagnosis not present

## 2017-03-10 DIAGNOSIS — J42 Unspecified chronic bronchitis: Secondary | ICD-10-CM

## 2017-03-10 DIAGNOSIS — L739 Follicular disorder, unspecified: Secondary | ICD-10-CM

## 2017-03-10 DIAGNOSIS — R05 Cough: Secondary | ICD-10-CM | POA: Diagnosis not present

## 2017-03-10 MED ORDER — CEFDINIR 300 MG PO CAPS: 300.0000 mg | ORAL_CAPSULE | Freq: Two times a day (BID) | ORAL | 0 refills | Status: DC

## 2017-03-10 NOTE — Patient Instructions (Signed)
Thank you for coming in today. Start antibiotic.  Get labs today.  You should hear about both the CT scan and the ultrasound to check lungs and leg.  Use the nasal ointment.  Wash your body with chlorhexidine body wash daily. Avoid shaving your legs. Recheck with [facility] in one week or so.    Folliculitis Folliculitis is inflammation of the hair follicles. Folliculitis most commonly occurs on the scalp, thighs, legs, back, and buttocks. However, it can occur anywhere on the body. What are the causes? This condition may be caused by:  A bacterial infection (common).  A fungal infection.  A viral infection.  Coming into contact with certain chemicals, especially oils and tars.  Shaving or waxing.  Applying greasy ointments or creams to your skin often. Long-lasting folliculitis and folliculitis that keeps coming back can be caused by bacteria that live in the nostrils. What increases the risk? This condition is more likely to develop in people with:  A weakened immune system.  Diabetes.  Obesity. What are the signs or symptoms? Symptoms of this condition include:  Redness.  Soreness.  Swelling.  Itching.  Small white or yellow, pus-filled, itchy spots (pustules) that appear over a reddened area. If there is an infection that goes deep into the follicle, these may develop into a boil (furuncle).  A group of closely packed boils (carbuncle). These tend to form in hairy, sweaty areas of the body. How is this diagnosed? This condition is diagnosed with a skin exam. To find what is causing the condition, your health care provider may take a sample of one of the pustules or boils for testing. How is this treated? This condition may be treated by:  Applying warm compresses to the affected areas.  Taking an antibiotic medicine or applying an antibiotic medicine to the skin.  Applying or bathing with an antiseptic solution.  Taking an over-the-counter medicine to help  with itching.  Having a procedure to drain any pustules or boils. This may be done if a pustule or boil contains a lot of pus or fluid.  Laser hair removal. This may be done to treat long-lasting folliculitis. Follow these instructions at home:  If directed, apply heat to the affected area as often as told by your health care provider. Use the heat source that your health care provider recommends, such as a moist heat pack or a heating pad.  Place a towel between your skin and the heat source.  Leave the heat on for 20-30 minutes.  Remove the heat if your skin turns bright red. This is especially important if you are unable to feel pain, heat, or cold. You may have a greater risk of getting burned.  If you were prescribed an antibiotic medicine, use it as told by your health care provider. Do not stop using the antibiotic even if you start to feel better.  Take over-the-counter and prescription medicines only as told by your health care provider.  Do not shave irritated skin.  Keep all follow-up visits as told by your health care provider. This is important. Get help right away if:  You have more redness, swelling, or pain in the affected area.  Red streaks are spreading from the affected area.  You have a fever. This information is not intended to replace advice given to you by your health care provider. Make sure you discuss any questions you have with your health care provider. Document Released: 02/21/2002 Document Revised: 07/02/2016 Document Reviewed: 10/03/2015 Elsevier Interactive  Patient Education  2017 [employer]  

## 2017-03-11 LAB — COMPLETE METABOLIC PANEL WITH GFR
ALT: 7 U/L (ref 6–29)
AST: 11 U/L (ref 10–35)
Albumin: 3.1 g/dL — ABNORMAL LOW (ref 3.6–5.1)
Alkaline Phosphatase: 77 U/L (ref 33–130)
BUN: 31 mg/dL — ABNORMAL HIGH (ref 7–25)
CO2: 20 mmol/L (ref 20–31)
Calcium: 8.1 mg/dL — ABNORMAL LOW (ref 8.6–10.4)
Chloride: 104 mmol/L (ref 98–110)
Creat: 1.39 mg/dL — ABNORMAL HIGH (ref 0.50–0.99)
GFR, Est African American: 46 mL/min — ABNORMAL LOW (ref >=60)
GFR, Est Non African American: 40 mL/min — ABNORMAL LOW (ref >=60)
Glucose, Bld: 166 mg/dL — ABNORMAL HIGH (ref 65–99)
Potassium: 3.8 mmol/L (ref 3.5–5.3)
Sodium: 138 mmol/L (ref 135–146)
Total Bilirubin: 0.5 mg/dL (ref 0.2–1.2)
Total Protein: 6.1 g/dL (ref 6.1–8.1)

## 2017-03-11 LAB — CBC WITH DIFFERENTIAL/PLATELET
Basophils Absolute: 0 cells/uL (ref 0–200)
Basophils Relative: 0 %
Eosinophils Absolute: 112 cells/uL (ref 15–500)
Eosinophils Relative: 1 %
HCT: 31.9 % — ABNORMAL LOW (ref 35.0–45.0)
Hemoglobin: 10.2 g/dL — ABNORMAL LOW (ref 11.7–15.5)
Lymphocytes Relative: 12 %
Lymphs Abs: 1344 cells/uL (ref 850–3900)
MCH: 29.8 pg (ref 27.0–33.0)
MCHC: 32 g/dL (ref 32.0–36.0)
MCV: 93.3 fL (ref 80.0–100.0)
MPV: 10.9 fL (ref 7.5–12.5)
Monocytes Absolute: 336 cells/uL (ref 200–950)
Monocytes Relative: 3 %
Neutro Abs: 9408 cells/uL — ABNORMAL HIGH (ref 1500–7800)
Neutrophils Relative %: 84 %
Platelets: 214 10*3/uL (ref 140–400)
RBC: 3.42 MIL/uL — ABNORMAL LOW (ref 3.80–5.10)
RDW: 15.9 % — ABNORMAL HIGH (ref 11.0–15.0)
WBC: 11.2 10*3/uL — ABNORMAL HIGH (ref 3.8–10.8)

## 2017-03-11 NOTE — Progress Notes (Signed)
[name] is a 66 y.o. female who presents to [place]: [facility] today for leg swelling and shortness of breath.  Left leg swelling: Patient notes left leg swelling with folliculitis. She's been seen by dermatology and treated with long courses of doxycycline and clindamycin cream. She notes this is helped but her swelling and red dots have returned. They're itchy and painful at times. She notes the left leg is a little more swollen than the right.  Additionally she notes ongoing shortness of breath. She has a history of COPD and has had a cough and shortness of breath for months. She's had multiple different treatments with steroids and with antibiotics. She's had chest x-rays that show low lung volumes. She's interested in a CT scan to further evaluate her lungs. She denies vomiting or diarrhea.   Past Medical History:  Diagnosis Date  . Anemia    hx of low iron  . Anxiety   . Arthritis   . Cataracts, bilateral   . Chronic kidney disease    nephrologist  Dr. Gillian Shields [facility], told pt. she is stage III  . Diabetes ([place])   . Family history of coronary artery disease   . Fatty liver   . GERD (gastroesophageal reflux disease)   . Headache   . Morbid obesity ([place])   . Peripheral vascular disease ([place])   . PONV (postoperative nausea and vomiting)   . Pulmonary embolism ([place]) 2001  . Restless leg syndrome   . Shortness of breath dyspnea   . Sleep apnea    can't wear her cpap due to nose being sore  . Venous stasis dermatitis of both lower extremities    Past Surgical History:  Procedure Laterality Date  . ABDOMINAL HYSTERECTOMY    . APPENDECTOMY    . BUNIONECTOMY Right 01/28/2015   Procedure: Lillard Anes;  Surgeon: Hessie Dibble, MD;  Location: [facility];  Service: Orthopedics;  Laterality: Right;  . CARDIAC CATHETERIZATION  2014  . CHOLECYSTECTOMY     . COLONOSCOPY    . COLONOSCOPY    . FRACTURE SURGERY Left    shoulder  . HERNIA REPAIR     umbilical hernia  . JOINT REPLACEMENT Right 2001   knee  . PANNICULECTOMY    . SHOULDER ARTHROSCOPY Right 02/24/2016   Procedure: RIGHT SHOULDER ARTHROSCOPY, ACROMIOPLASTY, DISTAL CLAVICLE RESECTION, DEBRIDEMENT;  Surgeon: Melrose Nakayama, MD;  Location: [facility];  Service: Orthopedics;  Laterality: Right;  . TENNIS ELBOW RELEASE/NIRSCHEL PROCEDURE Right 02/24/2016   Procedure: RIGHT ELBOW LATERAL REPAIR;  Surgeon: Melrose Nakayama, MD;  Location: [facility];  Service: Orthopedics;  Laterality: Right;   Social History  Substance Use Topics  . Smoking status: Never Smoker  . Smokeless tobacco: Never Used  . Alcohol use No   family history includes Cancer in her maternal grandfather and maternal grandmother; Crohn's disease in her sister; Diabetes in her mother; Heart attack in her father and mother; Heart failure in her sister; Hyperlipidemia in her father and mother; Hypertension in her father, mother, and sister; Parkinsonism in her brother; Renal Disease in her sister; Stroke in her paternal grandfather; Thyroid disease in her sister.  ROS as above:  Medications: Current Outpatient Prescriptions  Medication Sig Dispense Refill  . acetaminophen (TYLENOL) 500 MG tablet Take 1,000 mg by mouth.    [name] Kitchen albuterol (PROVENTIL HFA;VENTOLIN HFA) 108 (90 BASE) MCG/ACT inhaler Inhale 1-2 puffs into the lungs every 6 (six)  hours as needed for wheezing or shortness of breath.    . ALPRAZolam (XANAX) 1 MG tablet Take 1 tablet (1 mg total) by mouth 2 (two) times daily as needed for anxiety. 60 tablet 2  . aspirin 81 MG tablet Take 81 mg by mouth daily.    . benzonatate (TESSALON) 200 MG capsule Take 1 capsule (200 mg total) by mouth 3 (three) times daily as needed for cough. 45 capsule 0  . budesonide-formoterol (SYMBICORT) 160-4.5 MCG/ACT inhaler Inhale 2 puffs into the lungs 2 (two) times daily. 1 Inhaler 11  .  cefdinir (OMNICEF) 300 MG capsule Take 1 capsule (300 mg total) by mouth 2 (two) times daily. 14 capsule 0  . cholecalciferol (VITAMIN D) 1000 units tablet Take 1,000 Units by mouth daily.    . citalopram (CELEXA) 40 MG tablet Take 1 tablet (40 mg total) by mouth daily. 90 tablet 0  . co-enzyme Q-10 50 MG capsule Take 50 mg by mouth daily.    . colchicine 0.6 MG tablet Take 2 tablets once and then 1 tablet 1 hour later repeat no more than 3 days. For gout outbreak. 30 tablet 0  . doxycycline (VIBRAMYCIN) 100 MG capsule Take 1 capsule (100 mg total) by mouth 2 (two) times daily. For 10 days. 20 capsule 0  . DULoxetine (CYMBALTA) 60 MG capsule 2 po daily    . furosemide (LASIX) 40 MG tablet Take 1 tablet (40 mg total) by mouth daily. 90 tablet 1  . gabapentin (NEURONTIN) 100 MG capsule Take 1 capsule (100 mg total) by mouth 3 (three) times daily. 90 capsule 1  . glipiZIDE (GLUCOTROL) 5 MG tablet TAKE ONE TABLET BY MOUTH ONCE DAILY BEFORE BREAKFAST 90 tablet 1  . HYDROcodone-acetaminophen (NORCO/VICODIN) 5-325 MG tablet Take 1 tablet by mouth every 8 (eight) hours as needed for moderate pain. 30 tablet 0  . losartan (COZAAR) 25 MG tablet Take 1 tablet (25 mg total) by mouth daily. 90 tablet 1  . metFORMIN (GLUCOPHAGE) 1000 MG tablet Take 1 tablet (1,000 mg total) by mouth 2 (two) times daily with a meal. 180 tablet 2  . Multiple Vitamins-Minerals (OCUVITE PO) Take 1 tablet by mouth daily.    . mupirocin ointment (BACTROBAN) 2 % Place 1 application into the nose 3 (three) times daily.    [name] Kitchen omeprazole (PRILOSEC) 40 MG capsule Take 1 capsule (40 mg total) by mouth daily. 90 capsule 3  . potassium chloride SA (K-DUR,KLOR-CON) 20 MEQ tablet Take 0.5 tablets (10 mEq total) by mouth daily. 45 tablet 0  . pravastatin (PRAVACHOL) 40 MG tablet Take 1 tablet (40 mg total) by mouth daily. 30 tablet 5  . spironolactone (ALDACTONE) 25 MG tablet Take 1 tablet (25 mg total) by mouth daily. 90 tablet 1  . topiramate  (TOPAMAX) 50 MG tablet Take 200 mg by mouth at bedtime.     . Vitamin D, Ergocalciferol, (DRISDOL) 50000 units CAPS capsule Take 1 capsule (50,000 Units total) by mouth once a week. 4 capsule 2   No current facility-administered medications for this visit.    Allergies  Allergen Reactions  . Amlodipine Itching and Rash  . Penicillins Itching, Rash and Hives    Has patient had a PCN reaction causing immediate rash, facial/tongue/throat swelling, SOB or lightheadedness with hypotension: Yes  . Lovastatin Itching  . Sulfacetamide Sodium Itching  . Sulfamethoxazole Itching  . Sulfa Antibiotics Itching    Health Maintenance Health Maintenance  Topic Date Due  . FOOT EXAM  06/13/1962  . OPHTHALMOLOGY EXAM  06/13/1962  . HIV Screening  06/14/1967  . COLONOSCOPY  06/13/2002  . Hepatitis C Screening  02/06/2027 (Originally 01/18/1951)  . HEMOGLOBIN A1C  08/04/2017  . MAMMOGRAM  02/12/2018  . TETANUS/TDAP  12/27/2025  . INFLUENZA VACCINE  Completed  . PAP SMEAR  Excluded     Exam:  BP (!) 162/53   Pulse (!) 106   Wt (!) 301 lb (136.5 kg)   SpO2 100%   BMI 58.79 kg/m  Gen: Well NAD Morbidly obese nontoxic appearing HEENT: EOMI,  MMM Lungs: Normal work of breathing. Slight wheezing present bilaterally. Heart: Mild tachycardia no MRG Abd: NABS, Soft. Nondistended, Nontender Exts: Brisk capillary refill, warm and well perfused.  Chronic venous stasis appearance legs bilaterally. Left leg is slightly larger than right with small erythematous papules throughout. Nontender. No palpable cords.   Results for orders placed or performed in visit on 03/10/17 (from the past 72 hour(s))  CBC with Differential/Platelet     Status: Abnormal   Collection Time: 03/10/17  4:40 PM  Result Value Ref Range   WBC 11.2 (H) 3.8 - 10.8 K/uL   RBC 3.42 (L) 3.80 - 5.10 MIL/uL   Hemoglobin 10.2 (L) 11.7 - 15.5 g/dL   HCT 31.9 (L) 35.0 - 45.0 %   MCV 93.3 80.0 - 100.0 fL   MCH 29.8 27.0 - 33.0 pg    MCHC 32.0 32.0 - 36.0 g/dL   RDW 15.9 (H) 11.0 - 15.0 %   Platelets 214 140 - 400 K/uL   MPV 10.9 7.5 - 12.5 fL   Neutro Abs 9,408 (H) 1,500 - 7,800 cells/uL   Lymphs Abs 1,344 850 - 3,900 cells/uL   Monocytes Absolute 336 200 - 950 cells/uL   Eosinophils Absolute 112 15 - 500 cells/uL   Basophils Absolute 0 0 - 200 cells/uL   Neutrophils Relative % 84 %   Lymphocytes Relative 12 %   Monocytes Relative 3 %   Eosinophils Relative 1 %   Basophils Relative 0 %   Smear Review Criteria for review not met   COMPLETE METABOLIC PANEL WITH GFR     Status: Abnormal   Collection Time: 03/10/17  4:40 PM  Result Value Ref Range   Sodium 138 135 - 146 mmol/L   Potassium 3.8 3.5 - 5.3 mmol/L   Chloride 104 98 - 110 mmol/L   CO2 20 20 - 31 mmol/L   Glucose, Bld 166 (H) 65 - 99 mg/dL   BUN 31 (H) 7 - 25 mg/dL   Creat 1.39 (H) 0.50 - 0.99 mg/dL    Comment:   For patients > or = 65 years of age: The upper reference limit for Creatinine is approximately 13% higher for people identified as African-American.      Total Bilirubin 0.5 0.2 - 1.2 mg/dL   Alkaline Phosphatase 77 33 - 130 U/L   AST 11 10 - 35 U/L   ALT 7 6 - 29 U/L   Total Protein 6.1 6.1 - 8.1 g/dL   Albumin 3.1 (L) 3.6 - 5.1 g/dL   Calcium 8.1 (L) 8.6 - 10.4 mg/dL   GFR, Est African American 46 (L) >=60 mL/min   GFR, Est Non African American 40 (L) >=60 mL/min   No results found.    Assessment and Plan: 66 y.o. female with  Leg swelling: This is likely venous stasis with possible folliculitis. Treat folliculitis empirically with Omnicef as well as Hibiclens body wash. Additionally workup  for potential DVT with bilateral lower extremity ultrasounds.  As for chronic cough and subjective shortness of breath plan for a noncontrast CT scan of the chest as her symptoms have been ongoing for quite some time now. Additional we'll obtain limited metabolic workup as above.  Follow-up with PCP in the near future.   Orders Placed  This Encounter  Procedures  . CT CHEST WO CONTRAST    Standing Status:   Future    Standing Expiration Date:   05/10/2018    Order Specific Question:   Reason for Exam (SYMPTOM  OR DIAGNOSIS REQUIRED)    Answer:   eval chronic cough with poor lung volumes    Order Specific Question:   Preferred imaging location?    Answer:   [facility] Specific Question:   Radiology Contrast Protocol will attach to exam    Answer:   [email or web address]  . US Venous Img Lower Bilateral    Standing Status:   Future    Standing Expiration Date:   05/10/2018    Order Specific Question:   Reason for Exam (SYMPTOM  OR DIAGNOSIS REQUIRED)    Answer:   eval leg swelling    Order Specific Question:   Preferred imaging location?    Answer:   [facility]  . CBC with Differential/Platelet  . COMPLETE METABOLIC PANEL WITH GFR   Meds ordered this encounter  Medications  . cefdinir (OMNICEF) 300 MG capsule    Sig: Take 1 capsule (300 mg total) by mouth 2 (two) times daily.    Dispense:  14 capsule    Refill:  0     Discussed warning signs or symptoms. Please see discharge instructions. Patient expresses understanding.  I spent 40 minutes with this patient, greater than 50% was face-to-face time counseling regarding the above diagnosis.

## 2017-03-14 ENCOUNTER — Ambulatory Visit (HOSPITAL_BASED_OUTPATIENT_CLINIC_OR_DEPARTMENT_OTHER): Payer: Medicare Other

## 2017-03-15 ENCOUNTER — Ambulatory Visit (HOSPITAL_BASED_OUTPATIENT_CLINIC_OR_DEPARTMENT_OTHER)
Admission: RE | Admit: 2017-03-15 | Discharge: 2017-03-15 | Disposition: A | Discharge status: Home / Self Care | Payer: Medicare Other | Source: Ambulatory Visit | Attending: Family Medicine | Admitting: Family Medicine

## 2017-03-15 DIAGNOSIS — J42 Unspecified chronic bronchitis: Secondary | ICD-10-CM

## 2017-03-15 DIAGNOSIS — M7989 Other specified soft tissue disorders: Secondary | ICD-10-CM | POA: Diagnosis not present

## 2017-03-15 DIAGNOSIS — I700 Atherosclerosis of aorta: Secondary | ICD-10-CM | POA: Diagnosis not present

## 2017-03-15 DIAGNOSIS — L739 Follicular disorder, unspecified: Secondary | ICD-10-CM | POA: Diagnosis not present

## 2017-03-15 DIAGNOSIS — R05 Cough: Secondary | ICD-10-CM

## 2017-03-15 DIAGNOSIS — R053 Chronic cough: Secondary | ICD-10-CM

## 2017-03-15 DIAGNOSIS — R0602 Shortness of breath: Secondary | ICD-10-CM | POA: Diagnosis not present

## 2017-03-16 ENCOUNTER — Encounter: Payer: Self-pay | Admitting: Family Medicine

## 2017-03-16 DIAGNOSIS — I700 Atherosclerosis of aorta: Secondary | ICD-10-CM | POA: Insufficient documentation

## 2017-03-16 NOTE — Progress Notes (Signed)
Call HS:VEXO she have gastroenterologist. Some nodularity was seen over liver, suggesting cirrhois; however, liver enzymes are great. I would like for you to see a specialist to determine watchful waiting vs biospy.

## 2017-03-16 NOTE — Progress Notes (Signed)
Ok will you fax them new CT so they can follow patient for potential cirrhosis. Please send last CMP results.

## 2017-03-17 DIAGNOSIS — N182 Chronic kidney disease, stage 2 (mild): Secondary | ICD-10-CM | POA: Diagnosis not present

## 2017-03-17 DIAGNOSIS — I10 Essential (primary) hypertension: Secondary | ICD-10-CM | POA: Diagnosis not present

## 2017-03-18 ENCOUNTER — Ambulatory Visit: Payer: Medicare Other | Admitting: Physician Assistant

## 2017-03-19 ENCOUNTER — Other Ambulatory Visit: Payer: Self-pay | Admitting: Physician Assistant

## 2017-03-21 ENCOUNTER — Other Ambulatory Visit: Payer: Self-pay | Admitting: *Deleted

## 2017-03-21 ENCOUNTER — Ambulatory Visit: Payer: Medicare Other | Admitting: Physician Assistant

## 2017-03-21 DIAGNOSIS — E8881 Metabolic syndrome: Secondary | ICD-10-CM | POA: Diagnosis not present

## 2017-03-21 DIAGNOSIS — Z6841 Body Mass Index (BMI) 40.0 and over, adult: Secondary | ICD-10-CM | POA: Diagnosis not present

## 2017-03-21 DIAGNOSIS — K7581 Nonalcoholic steatohepatitis (NASH): Secondary | ICD-10-CM

## 2017-03-21 DIAGNOSIS — E785 Hyperlipidemia, unspecified: Secondary | ICD-10-CM | POA: Diagnosis not present

## 2017-03-21 DIAGNOSIS — K7460 Unspecified cirrhosis of liver: Secondary | ICD-10-CM | POA: Insufficient documentation

## 2017-03-21 DIAGNOSIS — I10 Essential (primary) hypertension: Secondary | ICD-10-CM | POA: Diagnosis not present

## 2017-03-21 DIAGNOSIS — R932 Abnormal findings on diagnostic imaging of liver and biliary tract: Secondary | ICD-10-CM | POA: Diagnosis not present

## 2017-03-21 DIAGNOSIS — R600 Localized edema: Secondary | ICD-10-CM | POA: Diagnosis not present

## 2017-03-21 DIAGNOSIS — K769 Liver disease, unspecified: Secondary | ICD-10-CM | POA: Diagnosis not present

## 2017-03-21 DIAGNOSIS — E119 Type 2 diabetes mellitus without complications: Secondary | ICD-10-CM | POA: Diagnosis not present

## 2017-03-22 ENCOUNTER — Ambulatory Visit: Payer: Medicare Other | Admitting: Physician Assistant

## 2017-03-22 DIAGNOSIS — J340 Abscess, furuncle and carbuncle of nose: Secondary | ICD-10-CM | POA: Diagnosis not present

## 2017-03-23 ENCOUNTER — Encounter: Payer: Self-pay | Admitting: Physician Assistant

## 2017-03-23 ENCOUNTER — Ambulatory Visit (INDEPENDENT_AMBULATORY_CARE_PROVIDER_SITE_OTHER): Payer: Medicare Other | Admitting: Physician Assistant

## 2017-03-23 VITALS — BP 150/66 | HR 110

## 2017-03-23 DIAGNOSIS — K7689 Other specified diseases of liver: Secondary | ICD-10-CM

## 2017-03-23 DIAGNOSIS — L739 Follicular disorder, unspecified: Secondary | ICD-10-CM

## 2017-03-23 DIAGNOSIS — R600 Localized edema: Secondary | ICD-10-CM

## 2017-03-23 DIAGNOSIS — I872 Venous insufficiency (chronic) (peripheral): Secondary | ICD-10-CM | POA: Diagnosis not present

## 2017-03-23 DIAGNOSIS — I10 Essential (primary) hypertension: Secondary | ICD-10-CM

## 2017-03-23 DIAGNOSIS — I700 Atherosclerosis of aorta: Secondary | ICD-10-CM

## 2017-03-23 DIAGNOSIS — G8929 Other chronic pain: Secondary | ICD-10-CM | POA: Diagnosis not present

## 2017-03-23 DIAGNOSIS — M25511 Pain in right shoulder: Secondary | ICD-10-CM

## 2017-03-23 MED ORDER — CEFDINIR 300 MG PO CAPS: 300.0000 mg | ORAL_CAPSULE | Freq: Two times a day (BID) | ORAL | 0 refills | Status: DC

## 2017-03-23 MED ORDER — ATORVASTATIN CALCIUM 40 MG PO TABS
40.0000 mg | ORAL_TABLET | Freq: Every day | ORAL | 5 refills | Status: DC
Start: 2017-03-23 — End: 2017-05-16

## 2017-03-23 MED ORDER — HYDROCODONE-ACETAMINOPHEN 5-325 MG PO TABS: 1.0000 | ORAL_TABLET | Freq: Three times a day (TID) | ORAL | 0 refills | Status: DC | PRN

## 2017-03-23 NOTE — Progress Notes (Signed)
   Subjective:    Patient ID: [name], female    DOB: [birth year], 66 y.o.   MRN: [id number]  HPI  Pt is a 66 yo morbidly obese female with a long PMH. She presents to the clinic to follow up on folliculitis of bilateral legs from previous provider. She was given omnicef and infection did clear. Swelling has improved. She continues to have redness in bilateral legs.   She is concerned about possible cirrhosis. She is seeing a specialist.   She is also worried about atherosclerosis of aorta seen on CT scan. She feels like she needs to be on a better statin.    Review of Systems  All other systems reviewed and are negative.      Objective:   Physical Exam  Constitutional: She appears well-developed and well-nourished.  Morbid obesity.   HENT:  Head: Normocephalic and atraumatic.  Cardiovascular: Normal rate, regular rhythm and normal heart sounds.   Pulmonary/Chest: Effort normal and breath sounds normal. She has no wheezes.  Skin:  hemosiderin staining of bilateral legs.  No pitting edema today.  No warmth.  Minimal tenderness.   Psychiatric: She has a normal mood and affect. Her behavior is normal.          Assessment & Plan:  [name] KitchenMarland KitchenDiagnoses and all orders for this visit:  Chronic venous stasis dermatitis of both lower extremities -     cefdinir (OMNICEF) 300 MG capsule; Take 1 capsule (300 mg total) by mouth 2 (two) times daily.  Chronic right shoulder pain -     HYDROcodone-acetaminophen (NORCO/VICODIN) 5-325 MG tablet; Take 1 tablet by mouth every 8 (eight) hours as needed for moderate pain.  Essential hypertension  Aortic atherosclerosis ([facility]) -     atorvastatin (LIPITOR) 40 MG tablet; Take 1 tablet (40 mg total) by mouth daily.  Morbid obesity ([facility])  Liver nodule  Bilateral edema of lower extremity  Folliculitis -     cefdinir (OMNICEF) 300 MG capsule; Take 1 capsule (300 mg total) by mouth 2 (two) times daily.   Pt is being managed by Dr.  Tami Lin, [facility] for liver nodule.  Reassurance that liver enzymes had been checked and were in the normal range.   Reassurance that infection of legs has cleared. Redness is the "staining" of venous insuffiency. Discussed elevated and compress of legs. Pt is on diuretic and edema seems to be controlled today. Given omnicef to keep on hand if bumps/redness/warmth/swelling occurs again.   Decided to swtich pravastatin to lipitor. Pt would like to be on a "better" statin.

## 2017-03-23 NOTE — Patient Instructions (Signed)

## 2017-03-25 ENCOUNTER — Encounter: Payer: Self-pay | Admitting: Physician Assistant

## 2017-03-25 DIAGNOSIS — K7689 Other specified diseases of liver: Secondary | ICD-10-CM | POA: Insufficient documentation

## 2017-03-25 DIAGNOSIS — I872 Venous insufficiency (chronic) (peripheral): Secondary | ICD-10-CM | POA: Insufficient documentation

## 2017-03-29 DIAGNOSIS — J3489 Other specified disorders of nose and nasal sinuses: Secondary | ICD-10-CM | POA: Diagnosis not present

## 2017-03-30 ENCOUNTER — Telehealth: Payer: Self-pay | Admitting: *Deleted

## 2017-03-30 NOTE — Telephone Encounter (Signed)
Per GI doctor patient does not have immunity to hep B. Called patient to have her schedule a nurse visit for 1st vaccine. Explained to patient that this would be a three series shot with the second shot being at least 4 weeks after the first and the third vaccine being 6 months from the first vaccine. The patient states she will call back to schedule nurse visit.

## 2017-04-04 DIAGNOSIS — Z01818 Encounter for other preprocedural examination: Secondary | ICD-10-CM | POA: Diagnosis not present

## 2017-04-04 DIAGNOSIS — H25813 Combined forms of age-related cataract, bilateral: Secondary | ICD-10-CM | POA: Diagnosis not present

## 2017-04-04 DIAGNOSIS — E1136 Type 2 diabetes mellitus with diabetic cataract: Secondary | ICD-10-CM | POA: Diagnosis not present

## 2017-04-04 DIAGNOSIS — H25812 Combined forms of age-related cataract, left eye: Secondary | ICD-10-CM | POA: Diagnosis not present

## 2017-04-05 DIAGNOSIS — K7460 Unspecified cirrhosis of liver: Secondary | ICD-10-CM | POA: Diagnosis not present

## 2017-04-06 ENCOUNTER — Ambulatory Visit: Payer: Medicare Other

## 2017-04-15 ENCOUNTER — Ambulatory Visit: Payer: Medicare Other

## 2017-04-19 ENCOUNTER — Other Ambulatory Visit: Payer: Self-pay | Admitting: Physician Assistant

## 2017-04-20 DIAGNOSIS — N183 Chronic kidney disease, stage 3 (moderate): Secondary | ICD-10-CM | POA: Diagnosis not present

## 2017-04-20 DIAGNOSIS — G4700 Insomnia, unspecified: Secondary | ICD-10-CM | POA: Diagnosis not present

## 2017-04-20 DIAGNOSIS — K3184 Gastroparesis: Secondary | ICD-10-CM | POA: Diagnosis not present

## 2017-04-20 DIAGNOSIS — Z7984 Long term (current) use of oral hypoglycemic drugs: Secondary | ICD-10-CM | POA: Diagnosis not present

## 2017-04-20 DIAGNOSIS — K449 Diaphragmatic hernia without obstruction or gangrene: Secondary | ICD-10-CM | POA: Diagnosis not present

## 2017-04-20 DIAGNOSIS — K219 Gastro-esophageal reflux disease without esophagitis: Secondary | ICD-10-CM | POA: Diagnosis not present

## 2017-04-20 DIAGNOSIS — Z79891 Long term (current) use of opiate analgesic: Secondary | ICD-10-CM | POA: Diagnosis not present

## 2017-04-20 DIAGNOSIS — I129 Hypertensive chronic kidney disease with stage 1 through stage 4 chronic kidney disease, or unspecified chronic kidney disease: Secondary | ICD-10-CM | POA: Diagnosis not present

## 2017-04-20 DIAGNOSIS — E669 Obesity, unspecified: Secondary | ICD-10-CM | POA: Diagnosis not present

## 2017-04-20 DIAGNOSIS — Z79899 Other long term (current) drug therapy: Secondary | ICD-10-CM | POA: Diagnosis not present

## 2017-04-20 DIAGNOSIS — D509 Iron deficiency anemia, unspecified: Secondary | ICD-10-CM | POA: Diagnosis not present

## 2017-04-20 DIAGNOSIS — Z888 Allergy status to other drugs, medicaments and biological substances status: Secondary | ICD-10-CM | POA: Diagnosis not present

## 2017-04-20 DIAGNOSIS — K7581 Nonalcoholic steatohepatitis (NASH): Secondary | ICD-10-CM | POA: Diagnosis not present

## 2017-04-20 DIAGNOSIS — Z7951 Long term (current) use of inhaled steroids: Secondary | ICD-10-CM | POA: Diagnosis not present

## 2017-04-20 DIAGNOSIS — K319 Disease of stomach and duodenum, unspecified: Secondary | ICD-10-CM | POA: Diagnosis not present

## 2017-04-20 DIAGNOSIS — J449 Chronic obstructive pulmonary disease, unspecified: Secondary | ICD-10-CM | POA: Diagnosis not present

## 2017-04-20 DIAGNOSIS — K3189 Other diseases of stomach and duodenum: Secondary | ICD-10-CM | POA: Diagnosis not present

## 2017-04-20 DIAGNOSIS — E1143 Type 2 diabetes mellitus with diabetic autonomic (poly)neuropathy: Secondary | ICD-10-CM | POA: Diagnosis not present

## 2017-04-20 DIAGNOSIS — G4733 Obstructive sleep apnea (adult) (pediatric): Secondary | ICD-10-CM | POA: Diagnosis not present

## 2017-04-20 DIAGNOSIS — Z6841 Body Mass Index (BMI) 40.0 and over, adult: Secondary | ICD-10-CM | POA: Diagnosis not present

## 2017-04-20 DIAGNOSIS — K7460 Unspecified cirrhosis of liver: Secondary | ICD-10-CM | POA: Diagnosis not present

## 2017-04-20 DIAGNOSIS — E785 Hyperlipidemia, unspecified: Secondary | ICD-10-CM | POA: Diagnosis not present

## 2017-04-20 DIAGNOSIS — M1990 Unspecified osteoarthritis, unspecified site: Secondary | ICD-10-CM | POA: Diagnosis not present

## 2017-04-20 DIAGNOSIS — Z1389 Encounter for screening for other disorder: Secondary | ICD-10-CM | POA: Diagnosis not present

## 2017-04-20 DIAGNOSIS — Z7982 Long term (current) use of aspirin: Secondary | ICD-10-CM | POA: Diagnosis not present

## 2017-04-20 DIAGNOSIS — E1122 Type 2 diabetes mellitus with diabetic chronic kidney disease: Secondary | ICD-10-CM | POA: Diagnosis not present

## 2017-04-28 DIAGNOSIS — G4733 Obstructive sleep apnea (adult) (pediatric): Secondary | ICD-10-CM | POA: Diagnosis not present

## 2017-04-28 DIAGNOSIS — E669 Obesity, unspecified: Secondary | ICD-10-CM | POA: Diagnosis not present

## 2017-04-28 DIAGNOSIS — I493 Ventricular premature depolarization: Secondary | ICD-10-CM | POA: Diagnosis not present

## 2017-04-28 DIAGNOSIS — I10 Essential (primary) hypertension: Secondary | ICD-10-CM | POA: Diagnosis not present

## 2017-04-28 DIAGNOSIS — K760 Fatty (change of) liver, not elsewhere classified: Secondary | ICD-10-CM | POA: Diagnosis not present

## 2017-04-28 DIAGNOSIS — Z7984 Long term (current) use of oral hypoglycemic drugs: Secondary | ICD-10-CM | POA: Diagnosis not present

## 2017-04-28 DIAGNOSIS — F39 Unspecified mood [affective] disorder: Secondary | ICD-10-CM | POA: Diagnosis not present

## 2017-04-28 DIAGNOSIS — J449 Chronic obstructive pulmonary disease, unspecified: Secondary | ICD-10-CM | POA: Diagnosis not present

## 2017-04-28 DIAGNOSIS — Z6841 Body Mass Index (BMI) 40.0 and over, adult: Secondary | ICD-10-CM | POA: Diagnosis not present

## 2017-04-28 DIAGNOSIS — E1140 Type 2 diabetes mellitus with diabetic neuropathy, unspecified: Secondary | ICD-10-CM | POA: Diagnosis not present

## 2017-04-28 DIAGNOSIS — F329 Major depressive disorder, single episode, unspecified: Secondary | ICD-10-CM | POA: Diagnosis not present

## 2017-04-28 DIAGNOSIS — E785 Hyperlipidemia, unspecified: Secondary | ICD-10-CM | POA: Diagnosis not present

## 2017-04-28 DIAGNOSIS — K219 Gastro-esophageal reflux disease without esophagitis: Secondary | ICD-10-CM | POA: Diagnosis not present

## 2017-04-28 DIAGNOSIS — G4700 Insomnia, unspecified: Secondary | ICD-10-CM | POA: Diagnosis not present

## 2017-04-28 DIAGNOSIS — H25812 Combined forms of age-related cataract, left eye: Secondary | ICD-10-CM | POA: Diagnosis not present

## 2017-04-28 DIAGNOSIS — I890 Lymphedema, not elsewhere classified: Secondary | ICD-10-CM | POA: Diagnosis not present

## 2017-05-04 ENCOUNTER — Ambulatory Visit: Payer: Medicare Other | Admitting: Physician Assistant

## 2017-05-09 ENCOUNTER — Ambulatory Visit: Payer: Medicare Other | Admitting: Physician Assistant

## 2017-05-09 DIAGNOSIS — Z01818 Encounter for other preprocedural examination: Secondary | ICD-10-CM | POA: Diagnosis not present

## 2017-05-09 DIAGNOSIS — H269 Unspecified cataract: Secondary | ICD-10-CM | POA: Diagnosis not present

## 2017-05-09 DIAGNOSIS — Z9842 Cataract extraction status, left eye: Secondary | ICD-10-CM | POA: Diagnosis not present

## 2017-05-09 DIAGNOSIS — Z961 Presence of intraocular lens: Secondary | ICD-10-CM | POA: Diagnosis not present

## 2017-05-10 ENCOUNTER — Encounter: Payer: Self-pay | Admitting: Family Medicine

## 2017-05-10 ENCOUNTER — Ambulatory Visit (INDEPENDENT_AMBULATORY_CARE_PROVIDER_SITE_OTHER): Payer: Medicare Other | Admitting: Family Medicine

## 2017-05-10 ENCOUNTER — Encounter: Payer: Self-pay | Admitting: Physician Assistant

## 2017-05-10 ENCOUNTER — Ambulatory Visit (INDEPENDENT_AMBULATORY_CARE_PROVIDER_SITE_OTHER): Payer: Medicare Other | Admitting: Physician Assistant

## 2017-05-10 VITALS — BP 105/62 | HR 94 | Ht 60.0 in

## 2017-05-10 VITALS — BP 111/46 | HR 83 | Ht 60.0 in

## 2017-05-10 DIAGNOSIS — G8929 Other chronic pain: Secondary | ICD-10-CM | POA: Diagnosis not present

## 2017-05-10 DIAGNOSIS — N183 Chronic kidney disease, stage 3 unspecified: Secondary | ICD-10-CM

## 2017-05-10 DIAGNOSIS — M25562 Pain in left knee: Secondary | ICD-10-CM | POA: Diagnosis not present

## 2017-05-10 DIAGNOSIS — I10 Essential (primary) hypertension: Secondary | ICD-10-CM

## 2017-05-10 DIAGNOSIS — E785 Hyperlipidemia, unspecified: Secondary | ICD-10-CM | POA: Diagnosis not present

## 2017-05-10 DIAGNOSIS — D509 Iron deficiency anemia, unspecified: Secondary | ICD-10-CM | POA: Diagnosis not present

## 2017-05-10 DIAGNOSIS — R0789 Other chest pain: Secondary | ICD-10-CM

## 2017-05-10 DIAGNOSIS — F5101 Primary insomnia: Secondary | ICD-10-CM | POA: Diagnosis not present

## 2017-05-10 DIAGNOSIS — K219 Gastro-esophageal reflux disease without esophagitis: Secondary | ICD-10-CM | POA: Diagnosis not present

## 2017-05-10 DIAGNOSIS — M25511 Pain in right shoulder: Secondary | ICD-10-CM | POA: Diagnosis not present

## 2017-05-10 DIAGNOSIS — E119 Type 2 diabetes mellitus without complications: Secondary | ICD-10-CM

## 2017-05-10 LAB — COMPLETE METABOLIC PANEL WITH GFR
ALT: 7 U/L (ref 6–29)
AST: 11 U/L (ref 10–35)
Albumin: 3.6 g/dL (ref 3.6–5.1)
Alkaline Phosphatase: 78 U/L (ref 33–130)
BUN: 23 mg/dL (ref 7–25)
CO2: 22 mmol/L (ref 20–31)
Calcium: 8.5 mg/dL — ABNORMAL LOW (ref 8.6–10.4)
Chloride: 101 mmol/L (ref 98–110)
Creat: 1.53 mg/dL — ABNORMAL HIGH (ref 0.50–0.99)
GFR, Est African American: 41 mL/min — ABNORMAL LOW (ref >=60)
GFR, Est Non African American: 36 mL/min — ABNORMAL LOW (ref >=60)
Glucose, Bld: 42 mg/dL — ABNORMAL LOW (ref 65–99)
Potassium: 3.9 mmol/L (ref 3.5–5.3)
Sodium: 137 mmol/L (ref 135–146)
Total Bilirubin: 0.4 mg/dL (ref 0.2–1.2)
Total Protein: 6.9 g/dL (ref 6.1–8.1)

## 2017-05-10 LAB — CBC
HCT: 30 % — ABNORMAL LOW (ref 35.0–45.0)
Hemoglobin: 9.8 g/dL — ABNORMAL LOW (ref 11.7–15.5)
MCH: 30.6 pg (ref 27.0–33.0)
MCHC: 32.7 g/dL (ref 32.0–36.0)
MCV: 93.8 fL (ref 80.0–100.0)
MPV: 10.9 fL (ref 7.5–12.5)
Platelets: 175 10*3/uL (ref 140–400)
RBC: 3.2 MIL/uL — ABNORMAL LOW (ref 3.80–5.10)
RDW: 15.1 % — ABNORMAL HIGH (ref 11.0–15.0)
WBC: 9.6 10*3/uL (ref 3.8–10.8)

## 2017-05-10 LAB — LIPID PANEL W/REFLEX DIRECT LDL
Cholesterol: 132 mg/dL (ref <200)
HDL: 44 mg/dL — ABNORMAL LOW (ref >50)
LDL-Cholesterol: 65 mg/dL
Non-HDL Cholesterol (Calc): 88 mg/dL (ref <130)
Total CHOL/HDL Ratio: 3 Ratio (ref <5.0)
Triglycerides: 151 mg/dL — ABNORMAL HIGH (ref <150)

## 2017-05-10 LAB — POCT GLYCOSYLATED HEMOGLOBIN (HGB A1C): Hemoglobin A1C: 5.2

## 2017-05-10 MED ORDER — TEMAZEPAM 7.5 MG PO CAPS: 7.5000 mg | ORAL_CAPSULE | Freq: Every evening | ORAL | 0 refills | Status: DC | PRN

## 2017-05-10 MED ORDER — HYDROCODONE-ACETAMINOPHEN 5-325 MG PO TABS: 1.0000 | ORAL_TABLET | Freq: Three times a day (TID) | ORAL | 0 refills | Status: DC | PRN

## 2017-05-10 NOTE — Patient Instructions (Signed)
Thank you for coming in today. Return as needed.  I recommend that you follow up with your orthopedic doctor about a knee replacement.  Use voltaren gel.  Apply ice as needed to the knee.    Arthritis Arthritis means joint pain. It can also mean joint disease. A joint is a place where bones come together. People who have arthritis may have:  Red joints.  Swollen joints.  Stiff joints.  Warm joints.  A fever.  A feeling of being sick. Follow these instructions at home: Pay attention to any changes in your symptoms. Take these actions to help with your pain and swelling. Medicines   Take over-the-counter and prescription medicines only as told by your doctor.  Do not take aspirin for pain if your doctor says that you may have gout. Activity   Rest your joint if your doctor tells you to.  Avoid activities that make the pain worse.  Exercise your joint regularly as told by your doctor. Try doing exercises like:  Swimming.  Water aerobics.  Biking.  Walking. Joint Care    If your joint is swollen, keep it raised (elevated) if told by your doctor.  If your joint feels stiff in the morning, try taking a warm shower.  If you have diabetes, do not apply heat without asking your doctor.  If told, apply heat to the joint:  Put a towel between the joint and the hot pack or heating pad.  Leave the heat on the area for 20-30 minutes.  If told, apply ice to the joint:  Put ice in a plastic bag.  Place a towel between your skin and the bag.  Leave the ice on for 20 minutes, 2-3 times per day.  Keep all follow-up visits as told by your doctor. Contact a doctor if:  The pain gets worse.  You have a fever. Get help right away if:  You have very bad pain in your joint.  You have swelling in your joint.  Your joint is red.  Many joints become painful and swollen.  You have very bad back pain.  Your leg is very weak.  You cannot control your pee (urine)  or poop (stool). This information is not intended to replace advice given to you by your health care provider. Make sure you discuss any questions you have with your health care provider. Document Released: 03/09/2010 Document Revised: 05/20/2016 Document Reviewed: 03/10/2015 Elsevier Interactive Patient Education  2017 [employer].

## 2017-05-10 NOTE — Progress Notes (Signed)
   Subjective:    Patient ID: [name], female    DOB: 1951/09/26, 66 y.o.   MRN: [id number]  HPI Pt is a 66 yo morbidly obese female who presents to the clinic for 3 month follow up for DM. She is not checking her sugars regularly. She is taking metformin and glipizide. She denies any hypoglycemic events. She is not exercising. She has no open sores or wounds.   She would like to try something for sleep. She is struggling and been struggling to get and stay asleep.   She request a refill on norco. She only uses it when she really needs it. 30 tablets were given over 2 months ago. Due to CKD she has to avoid NSAIDs.   She is also having some occasional chest tightness. She is on symbicort daily, she denies any wheezing. She is not using her rescue inhaler. She does not have a current cough. SOB usually with exertion and when she goes out in the heat.   Review of Systems  All other systems reviewed and are negative.      Objective:   Physical Exam  Constitutional: She is oriented to person, place, and time. She appears well-developed and well-nourished.  Morbidly obese.   HENT:  Head: Normocephalic and atraumatic.  Cardiovascular: Normal rate, regular rhythm and normal heart sounds.   Pulmonary/Chest: Effort normal and breath sounds normal. She has no wheezes. She has no rales. She exhibits no tenderness.  Neurological: She is alert and oriented to person, place, and time.  Psychiatric: She has a normal mood and affect. Her behavior is normal.          Assessment & Plan:  [name] KitchenMarland KitchenDiagnoses and all orders for this visit:  Type II diabetes mellitus, well controlled ([place]) -     POCT HgB A1C  Chest tightness  Chronic right shoulder pain -     HYDROcodone-acetaminophen (NORCO/VICODIN) 5-325 MG tablet; Take 1 tablet by mouth every 8 (eight) hours as needed for moderate pain.  Chronic pain of left knee  Primary insomnia -     temazepam (RESTORIL) 7.5 MG capsule; Take 1  capsule (7.5 mg total) by mouth at bedtime as needed for sleep.    Dr. Jolayne Panther opthalmology per patient she has had an up to date eye exam.  Dr. Gertie Fey [facility].  Lab Results  Component Value Date   HGBA1C 5.2 05/10/2017  A1c looks great.  Continue metformin and glipizide.   Chronic pain- [place] controlled substance database reviewed with no concerns. Not taking xanax daily only as needed and norco as needed as well. She agrees not to take either one together. She is aware of risk of sudden dealth increase.   Chest tightness- likely conditioning isses/obesity. Reassurance that lungs sound great today. Consider using rescue inhaler with chest tightness/SOB. High pollen count start claritin daily.  Continue daily symbicort. Avoid really humid hot days being outside. Follow up with any worsening symptoms.   Dr. Georgina Snell for consult on knee pain.

## 2017-05-10 NOTE — Patient Instructions (Addendum)
Start claritin make sure using albuterol inhaler.

## 2017-05-10 NOTE — Progress Notes (Signed)
[name] is a 66 y.o. female who presents to [facility] today for left knee pain. Patient notes chronic left knee pain. She notes a painful bump on the front part of her knee present for a few weeks. She denies any new injury. She has been told previously that she has end-stage DJD in that she will not benefit from any further steroid injections or Hyaluronic acid injections. She denies any new fevers or chills.  Additionally she notes that she would like labs checked for her diabetes kidneys and liver as well as cholesterol.   Past Medical History:  Diagnosis Date  . Anemia    hx of low iron  . Anxiety   . Arthritis   . Cataracts, bilateral   . Chronic kidney disease    nephrologist  Dr. Gillian Shields [facility], told pt. she is stage III  . Diabetes ([place])   . Family history of coronary artery disease   . Fatty liver   . GERD (gastroesophageal reflux disease)   . Headache   . Morbid obesity ([place])   . Peripheral vascular disease ([place])   . PONV (postoperative nausea and vomiting)   . Pulmonary embolism ([place]) 2001  . Restless leg syndrome   . Shortness of breath dyspnea   . Sleep apnea    can't wear her cpap due to nose being sore  . Venous stasis dermatitis of both lower extremities    Past Surgical History:  Procedure Laterality Date  . ABDOMINAL HYSTERECTOMY    . APPENDECTOMY    . BUNIONECTOMY Right 01/28/2015   Procedure: Lillard Anes;  Surgeon: Hessie Dibble, MD;  Location: [facility];  Service: Orthopedics;  Laterality: Right;  . CARDIAC CATHETERIZATION  2014  . CHOLECYSTECTOMY    . COLONOSCOPY    . COLONOSCOPY    . FRACTURE SURGERY Left    shoulder  . HERNIA REPAIR     umbilical hernia  . JOINT REPLACEMENT Right 2001   knee  . PANNICULECTOMY    . SHOULDER ARTHROSCOPY Right 02/24/2016   Procedure: RIGHT SHOULDER ARTHROSCOPY, ACROMIOPLASTY, DISTAL CLAVICLE RESECTION, DEBRIDEMENT;  Surgeon: Melrose Nakayama, MD;   Location: [facility];  Service: Orthopedics;  Laterality: Right;  . TENNIS ELBOW RELEASE/NIRSCHEL PROCEDURE Right 02/24/2016   Procedure: RIGHT ELBOW LATERAL REPAIR;  Surgeon: Melrose Nakayama, MD;  Location: [facility];  Service: Orthopedics;  Laterality: Right;   Social History  Substance Use Topics  . Smoking status: Never Smoker  . Smokeless tobacco: Never Used  . Alcohol use No     ROS:  As above   Medications: Current Outpatient Prescriptions  Medication Sig Dispense Refill  . acetaminophen (TYLENOL) 500 MG tablet Take 1,000 mg by mouth.    [name] Kitchen albuterol (PROVENTIL HFA;VENTOLIN HFA) 108 (90 BASE) MCG/ACT inhaler Inhale 1-2 puffs into the lungs every 6 (six) hours as needed for wheezing or shortness of breath.    . ALPRAZolam (XANAX) 1 MG tablet Take 1 tablet (1 mg total) by mouth 2 (two) times daily as needed for anxiety. 60 tablet 2  . aspirin 81 MG tablet Take 81 mg by mouth daily.    [name] Kitchen atorvastatin (LIPITOR) 40 MG tablet Take 1 tablet (40 mg total) by mouth daily. 30 tablet 5  . budesonide-formoterol (SYMBICORT) 160-4.5 MCG/ACT inhaler Inhale 2 puffs into the lungs 2 (two) times daily. 1 Inhaler 11  . cholecalciferol (VITAMIN D) 1000 units tablet Take 1,000 Units by mouth daily.    [name] Kitchen  citalopram (CELEXA) 40 MG tablet TAKE ONE TABLET BY MOUTH ONCE DAILY 90 tablet 0  . co-enzyme Q-10 50 MG capsule Take 50 mg by mouth daily.    . colchicine 0.6 MG tablet Take 2 tablets once and then 1 tablet 1 hour later repeat no more than 3 days. For gout outbreak. 30 tablet 0  . DULoxetine (CYMBALTA) 60 MG capsule 2 po daily    . furosemide (LASIX) 40 MG tablet Take 1 tablet (40 mg total) by mouth daily. 90 tablet 1  . glipiZIDE (GLUCOTROL) 5 MG tablet TAKE ONE TABLET BY MOUTH ONCE DAILY BEFORE BREAKFAST 90 tablet 1  . HYDROcodone-acetaminophen (NORCO/VICODIN) 5-325 MG tablet Take 1 tablet by mouth every 8 (eight) hours as needed for moderate pain. 30 tablet 0  . losartan (COZAAR) 25 MG tablet Take 1  tablet (25 mg total) by mouth daily. 90 tablet 1  . metFORMIN (GLUCOPHAGE) 1000 MG tablet Take 1 tablet (1,000 mg total) by mouth 2 (two) times daily with a meal. 180 tablet 2  . Multiple Vitamins-Minerals (OCUVITE PO) Take 1 tablet by mouth daily.    . mupirocin ointment (BACTROBAN) 2 % Place 1 application into the nose 3 (three) times daily.    [name] Kitchen omeprazole (PRILOSEC) 40 MG capsule Take 1 capsule (40 mg total) by mouth daily. 90 capsule 3  . potassium chloride SA (K-DUR,KLOR-CON) 20 MEQ tablet Take 0.5 tablets (10 mEq total) by mouth daily. 45 tablet 0  . spironolactone (ALDACTONE) 25 MG tablet Take 1 tablet (25 mg total) by mouth daily. 90 tablet 1  . temazepam (RESTORIL) 7.5 MG capsule Take 1 capsule (7.5 mg total) by mouth at bedtime as needed for sleep. 30 capsule 0  . topiramate (TOPAMAX) 50 MG tablet Take 200 mg by mouth at bedtime.     . Vitamin D, Ergocalciferol, (DRISDOL) 50000 units CAPS capsule TAKE 1 CAPSULE BY MOUTH ONCE A WEEK 4 capsule 2   No current facility-administered medications for this visit.    Allergies  Allergen Reactions  . Amlodipine Itching and Rash  . Penicillins Itching, Rash and Hives    Has patient had a PCN reaction causing immediate rash, facial/tongue/throat swelling, SOB or lightheadedness with hypotension: Yes  . Lovastatin Itching  . Sulfacetamide Sodium Itching  . Sulfamethoxazole Itching  . Sulfa Antibiotics Itching     Exam:  BP (!) 111/46   Pulse 83   Ht 5' (1.524 m)   SpO2 100%  General: Morbidly obese, well nourished, and in no acute distress.  Neuro/Psych: Alert and oriented x3, extra-ocular muscles intact, able to move all 4 extremities, sensation grossly intact. Skin: Warm and dry, chronic venous stasis changes legs bilaterally  Respiratory: Not using accessory muscles, speaking in full sentences, trachea midline.  Cardiovascular: Pulses palpable, no extremity edema. Abdomen: Does not appear distended. MSK: Knees bilaterally are  morbidly obese. No obvious effusions noted. The tender area at the anterior aspect of her knee is solid and appears consistent with the patella on exam. She is tender to palpation at the medial corner of her patella. Normal knee motion with crepitations. Stable ligamentous exam.  Limited musculoskeletal ultrasound reveals a well-appearing patella with intact quadriceps and patellar tendon. No prepatellar bursitis is seen. Area of maximal tenderness visualized with small area of hypoechoic change and increased vascularity indicating inflammation. This is located at the medial corner of the superior patella Otherwise normal-appearing patella    Results for orders placed or performed in visit on 05/10/17 (from the  past 48 hour(s))  POCT HgB A1C     Status: None   Collection Time: 05/10/17 10:32 AM  Result Value Ref Range   Hemoglobin A1C 5.2    No results found.    Assessment and Plan: 66 y.o. 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 We had a lengthy discussion about options. I am doubtful that injected this area will provide much benefit and puts her at greater risk of infections and of increased blood sugar. I recommend that she consider knee replacement if able. Return as needed.  Discussed with PCP labs ordered.    Orders Placed This Encounter  Procedures  . CBC  . COMPLETE METABOLIC PANEL WITH GFR  . Lipid Panel w/reflex Direct LDL   No orders of the defined types were placed in this encounter.   Discussed warning signs or symptoms. Please see discharge instructions. Patient expresses understanding.

## 2017-05-12 ENCOUNTER — Telehealth: Payer: Self-pay | Admitting: Physician Assistant

## 2017-05-12 ENCOUNTER — Encounter: Payer: Self-pay | Admitting: Physician Assistant

## 2017-05-12 DIAGNOSIS — G8929 Other chronic pain: Secondary | ICD-10-CM | POA: Insufficient documentation

## 2017-05-12 DIAGNOSIS — M25561 Pain in right knee: Secondary | ICD-10-CM

## 2017-05-12 DIAGNOSIS — M25562 Pain in left knee: Secondary | ICD-10-CM

## 2017-05-12 NOTE — Telephone Encounter (Signed)
Dr. Jolayne Panther opthalmology per patient she has had an up to date eye exam.  Dr. Gertie Fey [facility] for colonoscopy report

## 2017-05-13 NOTE — Telephone Encounter (Signed)
Fax requests placed in [name]'s basket.

## 2017-05-16 ENCOUNTER — Other Ambulatory Visit: Payer: Self-pay | Admitting: *Deleted

## 2017-05-16 DIAGNOSIS — I700 Atherosclerosis of aorta: Secondary | ICD-10-CM

## 2017-05-16 MED ORDER — ATORVASTATIN CALCIUM 40 MG PO TABS: 40.0000 mg | ORAL_TABLET | Freq: Every day | ORAL | 1 refills | Status: DC

## 2017-05-26 DIAGNOSIS — D509 Iron deficiency anemia, unspecified: Secondary | ICD-10-CM | POA: Diagnosis not present

## 2017-05-26 DIAGNOSIS — E785 Hyperlipidemia, unspecified: Secondary | ICD-10-CM | POA: Diagnosis not present

## 2017-05-26 DIAGNOSIS — M1710 Unilateral primary osteoarthritis, unspecified knee: Secondary | ICD-10-CM | POA: Diagnosis not present

## 2017-05-26 DIAGNOSIS — K3184 Gastroparesis: Secondary | ICD-10-CM | POA: Diagnosis not present

## 2017-05-26 DIAGNOSIS — H538 Other visual disturbances: Secondary | ICD-10-CM | POA: Diagnosis not present

## 2017-05-26 DIAGNOSIS — E1122 Type 2 diabetes mellitus with diabetic chronic kidney disease: Secondary | ICD-10-CM | POA: Diagnosis not present

## 2017-05-26 DIAGNOSIS — F418 Other specified anxiety disorders: Secondary | ICD-10-CM | POA: Diagnosis not present

## 2017-05-26 DIAGNOSIS — F39 Unspecified mood [affective] disorder: Secondary | ICD-10-CM | POA: Diagnosis not present

## 2017-05-26 DIAGNOSIS — Z9842 Cataract extraction status, left eye: Secondary | ICD-10-CM | POA: Diagnosis not present

## 2017-05-26 DIAGNOSIS — G2581 Restless legs syndrome: Secondary | ICD-10-CM | POA: Diagnosis not present

## 2017-05-26 DIAGNOSIS — K7460 Unspecified cirrhosis of liver: Secondary | ICD-10-CM | POA: Diagnosis not present

## 2017-05-26 DIAGNOSIS — K219 Gastro-esophageal reflux disease without esophagitis: Secondary | ICD-10-CM | POA: Diagnosis not present

## 2017-05-26 DIAGNOSIS — K7581 Nonalcoholic steatohepatitis (NASH): Secondary | ICD-10-CM | POA: Diagnosis not present

## 2017-05-26 DIAGNOSIS — N183 Chronic kidney disease, stage 3 (moderate): Secondary | ICD-10-CM | POA: Diagnosis not present

## 2017-05-26 DIAGNOSIS — E1136 Type 2 diabetes mellitus with diabetic cataract: Secondary | ICD-10-CM | POA: Diagnosis not present

## 2017-05-26 DIAGNOSIS — H25811 Combined forms of age-related cataract, right eye: Secondary | ICD-10-CM | POA: Diagnosis not present

## 2017-05-26 DIAGNOSIS — E1140 Type 2 diabetes mellitus with diabetic neuropathy, unspecified: Secondary | ICD-10-CM | POA: Diagnosis not present

## 2017-05-26 DIAGNOSIS — G4733 Obstructive sleep apnea (adult) (pediatric): Secondary | ICD-10-CM | POA: Diagnosis not present

## 2017-05-26 DIAGNOSIS — Z961 Presence of intraocular lens: Secondary | ICD-10-CM | POA: Diagnosis not present

## 2017-05-26 DIAGNOSIS — J449 Chronic obstructive pulmonary disease, unspecified: Secondary | ICD-10-CM | POA: Diagnosis not present

## 2017-05-26 DIAGNOSIS — G4700 Insomnia, unspecified: Secondary | ICD-10-CM | POA: Diagnosis not present

## 2017-05-26 DIAGNOSIS — E1143 Type 2 diabetes mellitus with diabetic autonomic (poly)neuropathy: Secondary | ICD-10-CM | POA: Diagnosis not present

## 2017-05-26 DIAGNOSIS — I129 Hypertensive chronic kidney disease with stage 1 through stage 4 chronic kidney disease, or unspecified chronic kidney disease: Secondary | ICD-10-CM | POA: Diagnosis not present

## 2017-05-26 DIAGNOSIS — M1610 Unilateral primary osteoarthritis, unspecified hip: Secondary | ICD-10-CM | POA: Diagnosis not present

## 2017-05-26 DIAGNOSIS — H25812 Combined forms of age-related cataract, left eye: Secondary | ICD-10-CM | POA: Diagnosis not present

## 2017-05-27 DIAGNOSIS — Z961 Presence of intraocular lens: Secondary | ICD-10-CM | POA: Diagnosis not present

## 2017-06-01 ENCOUNTER — Other Ambulatory Visit: Payer: Self-pay | Admitting: Physician Assistant

## 2017-06-03 DIAGNOSIS — Z9841 Cataract extraction status, right eye: Secondary | ICD-10-CM | POA: Diagnosis not present

## 2017-06-03 DIAGNOSIS — Z961 Presence of intraocular lens: Secondary | ICD-10-CM | POA: Diagnosis not present

## 2017-06-03 DIAGNOSIS — Z79899 Other long term (current) drug therapy: Secondary | ICD-10-CM | POA: Diagnosis not present

## 2017-06-07 ENCOUNTER — Ambulatory Visit: Payer: Medicare Other | Admitting: Physician Assistant

## 2017-06-09 ENCOUNTER — Ambulatory Visit: Payer: Medicare Other | Admitting: Family Medicine

## 2017-06-13 ENCOUNTER — Ambulatory Visit: Payer: Medicare Other | Admitting: Physician Assistant

## 2017-06-14 ENCOUNTER — Ambulatory Visit (INDEPENDENT_AMBULATORY_CARE_PROVIDER_SITE_OTHER): Payer: Medicare Other | Admitting: Physician Assistant

## 2017-06-14 ENCOUNTER — Encounter: Payer: Self-pay | Admitting: Physician Assistant

## 2017-06-14 VITALS — BP 153/58 | HR 107 | Ht 60.0 in | Wt 298.0 lb

## 2017-06-14 DIAGNOSIS — I872 Venous insufficiency (chronic) (peripheral): Secondary | ICD-10-CM

## 2017-06-14 DIAGNOSIS — E119 Type 2 diabetes mellitus without complications: Secondary | ICD-10-CM | POA: Diagnosis not present

## 2017-06-14 DIAGNOSIS — R51 Headache: Secondary | ICD-10-CM

## 2017-06-14 DIAGNOSIS — F331 Major depressive disorder, recurrent, moderate: Secondary | ICD-10-CM

## 2017-06-14 DIAGNOSIS — M25562 Pain in left knee: Secondary | ICD-10-CM | POA: Diagnosis not present

## 2017-06-14 DIAGNOSIS — N183 Chronic kidney disease, stage 3 unspecified: Secondary | ICD-10-CM

## 2017-06-14 DIAGNOSIS — F5101 Primary insomnia: Secondary | ICD-10-CM

## 2017-06-14 DIAGNOSIS — Z23 Encounter for immunization: Secondary | ICD-10-CM | POA: Diagnosis not present

## 2017-06-14 DIAGNOSIS — R600 Localized edema: Secondary | ICD-10-CM | POA: Diagnosis not present

## 2017-06-14 DIAGNOSIS — I10 Essential (primary) hypertension: Secondary | ICD-10-CM

## 2017-06-14 DIAGNOSIS — K219 Gastro-esophageal reflux disease without esophagitis: Secondary | ICD-10-CM

## 2017-06-14 DIAGNOSIS — G8929 Other chronic pain: Secondary | ICD-10-CM

## 2017-06-14 DIAGNOSIS — M25511 Pain in right shoulder: Secondary | ICD-10-CM | POA: Diagnosis not present

## 2017-06-14 DIAGNOSIS — J42 Unspecified chronic bronchitis: Secondary | ICD-10-CM | POA: Diagnosis not present

## 2017-06-14 DIAGNOSIS — I700 Atherosclerosis of aorta: Secondary | ICD-10-CM | POA: Diagnosis not present

## 2017-06-14 DIAGNOSIS — R519 Headache, unspecified: Secondary | ICD-10-CM

## 2017-06-14 MED ORDER — FUROSEMIDE 40 MG PO TABS: 40.0000 mg | ORAL_TABLET | Freq: Every day | ORAL | 1 refills | Status: DC

## 2017-06-14 MED ORDER — TEMAZEPAM 7.5 MG PO CAPS: 7.5000 mg | ORAL_CAPSULE | Freq: Every evening | ORAL | 1 refills | Status: DC | PRN

## 2017-06-14 MED ORDER — OMEPRAZOLE 40 MG PO CPDR: 40.0000 mg | DELAYED_RELEASE_CAPSULE | Freq: Every day | ORAL | 3 refills | Status: DC

## 2017-06-14 MED ORDER — GLIPIZIDE 5 MG PO TABS: ORAL_TABLET | ORAL | 1 refills | Status: DC

## 2017-06-14 MED ORDER — DULOXETINE HCL 60 MG PO CPEP: ORAL_CAPSULE | ORAL | 1 refills | Status: DC

## 2017-06-14 MED ORDER — ATORVASTATIN CALCIUM 40 MG PO TABS: 40.0000 mg | ORAL_TABLET | Freq: Every day | ORAL | 1 refills | Status: DC

## 2017-06-14 MED ORDER — METFORMIN HCL 1000 MG PO TABS: 1000.0000 mg | ORAL_TABLET | Freq: Two times a day (BID) | ORAL | 2 refills | Status: DC

## 2017-06-14 MED ORDER — BUDESONIDE-FORMOTEROL FUMARATE 160-4.5 MCG/ACT IN AERO: 2.0000 | INHALATION_SPRAY | Freq: Two times a day (BID) | RESPIRATORY_TRACT | 3 refills | Status: DC

## 2017-06-14 MED ORDER — LOSARTAN POTASSIUM 25 MG PO TABS: 25.0000 mg | ORAL_TABLET | Freq: Every day | ORAL | 1 refills | Status: DC

## 2017-06-14 MED ORDER — HYDROCODONE-ACETAMINOPHEN 5-325 MG PO TABS: 1.0000 | ORAL_TABLET | Freq: Three times a day (TID) | ORAL | 0 refills | Status: DC | PRN

## 2017-06-14 MED ORDER — TOPIRAMATE 50 MG PO TABS: 200.0000 mg | ORAL_TABLET | Freq: Every day | ORAL | 1 refills | Status: DC

## 2017-06-14 MED ORDER — POTASSIUM CHLORIDE CRYS ER 20 MEQ PO TBCR: 10.0000 meq | EXTENDED_RELEASE_TABLET | Freq: Every day | ORAL | 1 refills | Status: DC

## 2017-06-14 MED ORDER — SPIRONOLACTONE 25 MG PO TABS: 25.0000 mg | ORAL_TABLET | Freq: Every day | ORAL | 1 refills | Status: DC

## 2017-06-14 MED ORDER — CITALOPRAM HYDROBROMIDE 40 MG PO TABS: 40.0000 mg | ORAL_TABLET | Freq: Every day | ORAL | 1 refills | Status: DC

## 2017-06-14 NOTE — Progress Notes (Signed)
Subjective:    Patient ID: [name], female    DOB: [birth year], 66 y.o.   MRN: [id number]  HPI   Pt is a 66 yo morbidly obese with mutliple chronic diseases female who presents to the clinic to start hep B series and get refills.   .. Active Ambulatory Problems    Diagnosis Date Noted  . History of pulmonary embolism 03/20/2014  . Type II diabetes mellitus, well controlled ([place]) 03/20/2014  . Morbid obesity ([place]) 03/20/2014  . Family history of heart disease 03/20/2014  . Bunion, right foot 03/20/2014  . Frequent headaches 07/20/2016  . Macular degeneration, dry 07/20/2016  . No energy 07/21/2016  . OSA on CPAP 07/21/2016  . Closed fracture of nasal bone 07/21/2016  . Hyperlipidemia 07/21/2016  . Depression 07/21/2016  . Gastroesophageal reflux disease without esophagitis 07/21/2016  . Facial pain 07/21/2016  . Chronic bronchitis ([place]) 07/21/2016  . Bilateral edema of lower extremity 07/21/2016  . B12 deficiency 07/21/2016  . Iron deficiency anemia 07/21/2016  . CKD (chronic kidney disease), stage III 08/04/2016  . Insomnia 08/04/2016  . Hypertriglyceridemia 08/06/2016  . Diabetic polyneuropathy associated with diabetes mellitus due to underlying condition ([place]) 11/07/2016  . Vitamin D deficiency 11/07/2016  . Chronic right shoulder pain 02/06/2017  . RLS (restless legs syndrome) 02/06/2017  . Essential hypertension 02/06/2017  . Left hand pain 02/06/2017  . Post-menopausal 02/06/2017  . Anxiety 02/06/2017  . Osteopenia 03/01/2017  . Aortic atherosclerosis ([place]) 03/16/2017  . Liver nodule 03/25/2017  . Chronic venous stasis dermatitis of both lower extremities 03/25/2017  . Chronic pain of left knee 05/12/2017   Resolved Ambulatory Problems    Diagnosis Date Noted  . No Resolved Ambulatory Problems   Past Medical History:  Diagnosis Date  . Anemia   . Anxiety   . Arthritis   . Cataracts, bilateral   . Chronic kidney disease   . Diabetes ([place])   .  Family history of coronary artery disease   . Fatty liver   . GERD (gastroesophageal reflux disease)   . Headache   . Morbid obesity ([place])   . Peripheral vascular disease ([place])   . PONV (postoperative nausea and vomiting)   . Pulmonary embolism ([place]) 2001  . Restless leg syndrome   . Shortness of breath dyspnea   . Sleep apnea   . Venous stasis dermatitis of both lower extremities    Hep B series is requested by GI. She has never had done.   She wants liver enzymes are good today.   She needs refill of her norco for knee pain and overall joint pain.   MDD-controlled. She has no problems or concerns. Doing well on medication.   HTN- she is not checking BP's regularly. Denies any CP, palpitations, headaches, or vision changes. She did not have her medication today.    .          Review of Systems See HPI.     Objective:   Physical Exam  Constitutional: She is oriented to person, place, and time. She appears well-developed and well-nourished.  Morbidly obese.   HENT:  Head: Normocephalic and atraumatic.  Cardiovascular: Normal rate, regular rhythm and normal heart sounds.   Pulmonary/Chest: Effort normal and breath sounds normal.  Neurological: She is alert and oriented to person, place, and time.  Psychiatric: She has a normal mood and affect. Her behavior is normal.          Assessment & Plan:  [name] KitchenMarland KitchenDiagnoses  and all orders for this visit:  Essential hypertension -     losartan (COZAAR) 25 MG tablet; Take 1 tablet (25 mg total) by mouth daily. -     spironolactone (ALDACTONE) 25 MG tablet; Take 1 tablet (25 mg total) by mouth daily.  CKD (chronic kidney disease) stage 3, GFR 30-59 ml/min -     BASIC METABOLIC PANEL WITH GFR  Aortic atherosclerosis (HCC) -     atorvastatin (LIPITOR) 40 MG tablet; Take 1 tablet (40 mg total) by mouth daily.  Type II diabetes mellitus, well controlled (HCC) -     glipiZIDE (GLUCOTROL) 5 MG tablet; TAKE ONE TABLET BY MOUTH  ONCE DAILY BEFORE BREAKFAST -     metFORMIN (GLUCOPHAGE) 1000 MG tablet; Take 1 tablet (1,000 mg total) by mouth 2 (two) times daily with a meal.  Primary insomnia -     temazepam (RESTORIL) 7.5 MG capsule; Take 1 capsule (7.5 mg total) by mouth at bedtime as needed for sleep.  Chronic right shoulder pain -     HYDROcodone-acetaminophen (NORCO/VICODIN) 5-325 MG tablet; Take 1 tablet by mouth every 8 (eight) hours as needed for moderate pain.  Need for hepatitis vaccination -     Hepatitis B vaccine adult IM  Gastroesophageal reflux disease, esophagitis presence not specified -     omeprazole (PRILOSEC) 40 MG capsule; Take 1 capsule (40 mg total) by mouth daily.  Chronic venous stasis dermatitis of both lower extremities -     furosemide (LASIX) 40 MG tablet; Take 1 tablet (40 mg total) by mouth daily.  Chronic bronchitis, unspecified chronic bronchitis type (HCC) -     budesonide-formoterol (SYMBICORT) 160-4.5 MCG/ACT inhaler; Inhale 2 puffs into the lungs 2 (two) times daily.  Chronic pain of left knee  Bilateral edema of lower extremity -     furosemide (LASIX) 40 MG tablet; Take 1 tablet (40 mg total) by mouth daily.  Frequent headaches -     topiramate (TOPAMAX) 50 MG tablet; Take 4 tablets (200 mg total) by mouth at bedtime.  Moderate episode of recurrent major depressive disorder (HCC) -     citalopram (CELEXA) 40 MG tablet; Take 1 tablet (40 mg total) by mouth daily. -     DULoxetine (CYMBALTA) 60 MG capsule; 2 po daily  Other orders -     potassium chloride SA (K-DUR,KLOR-CON) 20 MEQ tablet; Take 0.5 tablets (10 mEq total) by mouth daily.   Refills given of maintaince medications.   [place] controlled substance database reviewed. On pain contract. norco refill given.   Pt will make appt with orthopedic for left knee pain.   Hep B series started today at request of GI.   Reassurance that labs have been drawn: liver enzymes are great and a1c great as well.

## 2017-06-15 LAB — BASIC METABOLIC PANEL WITH GFR
BUN: 25 mg/dL (ref 7–25)
CO2: 21 mmol/L (ref 20–31)
Calcium: 8.7 mg/dL (ref 8.6–10.4)
Chloride: 105 mmol/L (ref 98–110)
Creat: 1.36 mg/dL — ABNORMAL HIGH (ref 0.50–0.99)
GFR, Est African American: 47 mL/min — ABNORMAL LOW (ref >=60)
GFR, Est Non African American: 41 mL/min — ABNORMAL LOW (ref >=60)
Glucose, Bld: 101 mg/dL — ABNORMAL HIGH (ref 65–99)
Potassium: 3.9 mmol/L (ref 3.5–5.3)
Sodium: 138 mmol/L (ref 135–146)

## 2017-06-15 NOTE — Progress Notes (Signed)
Call pt: kidney function is improving.

## 2017-06-16 ENCOUNTER — Encounter: Payer: Self-pay | Admitting: Physician Assistant

## 2017-06-17 ENCOUNTER — Telehealth: Payer: Self-pay | Admitting: Physician Assistant

## 2017-06-17 NOTE — Telephone Encounter (Signed)
Please call and get eye exam from [facility] Dr. Lurena Nida?

## 2017-06-20 NOTE — Telephone Encounter (Signed)
Fax request placed at front office.

## 2017-06-24 DIAGNOSIS — M25562 Pain in left knee: Secondary | ICD-10-CM | POA: Diagnosis not present

## 2017-06-24 DIAGNOSIS — M25561 Pain in right knee: Secondary | ICD-10-CM | POA: Diagnosis not present

## 2017-06-24 DIAGNOSIS — M79671 Pain in right foot: Secondary | ICD-10-CM | POA: Diagnosis not present

## 2017-07-07 DIAGNOSIS — J329 Chronic sinusitis, unspecified: Secondary | ICD-10-CM | POA: Diagnosis not present

## 2017-07-07 DIAGNOSIS — Z961 Presence of intraocular lens: Secondary | ICD-10-CM | POA: Diagnosis not present

## 2017-07-20 DIAGNOSIS — E559 Vitamin D deficiency, unspecified: Secondary | ICD-10-CM | POA: Diagnosis not present

## 2017-07-20 DIAGNOSIS — E538 Deficiency of other specified B group vitamins: Secondary | ICD-10-CM | POA: Diagnosis not present

## 2017-07-20 DIAGNOSIS — Z794 Long term (current) use of insulin: Secondary | ICD-10-CM | POA: Diagnosis not present

## 2017-07-20 DIAGNOSIS — E1122 Type 2 diabetes mellitus with diabetic chronic kidney disease: Secondary | ICD-10-CM | POA: Diagnosis not present

## 2017-07-20 DIAGNOSIS — D649 Anemia, unspecified: Secondary | ICD-10-CM | POA: Diagnosis not present

## 2017-07-20 DIAGNOSIS — K760 Fatty (change of) liver, not elsewhere classified: Secondary | ICD-10-CM | POA: Diagnosis not present

## 2017-07-20 DIAGNOSIS — R5383 Other fatigue: Secondary | ICD-10-CM | POA: Diagnosis not present

## 2017-07-20 DIAGNOSIS — D500 Iron deficiency anemia secondary to blood loss (chronic): Secondary | ICD-10-CM | POA: Diagnosis not present

## 2017-07-20 DIAGNOSIS — N183 Chronic kidney disease, stage 3 (moderate): Secondary | ICD-10-CM | POA: Diagnosis not present

## 2017-07-20 DIAGNOSIS — D509 Iron deficiency anemia, unspecified: Secondary | ICD-10-CM | POA: Diagnosis not present

## 2017-07-22 DIAGNOSIS — R5383 Other fatigue: Secondary | ICD-10-CM | POA: Diagnosis not present

## 2017-07-22 DIAGNOSIS — D500 Iron deficiency anemia secondary to blood loss (chronic): Secondary | ICD-10-CM | POA: Diagnosis not present

## 2017-07-22 DIAGNOSIS — K760 Fatty (change of) liver, not elsewhere classified: Secondary | ICD-10-CM | POA: Diagnosis not present

## 2017-07-22 DIAGNOSIS — E538 Deficiency of other specified B group vitamins: Secondary | ICD-10-CM | POA: Diagnosis not present

## 2017-07-22 DIAGNOSIS — E1122 Type 2 diabetes mellitus with diabetic chronic kidney disease: Secondary | ICD-10-CM | POA: Diagnosis not present

## 2017-07-22 DIAGNOSIS — E559 Vitamin D deficiency, unspecified: Secondary | ICD-10-CM | POA: Diagnosis not present

## 2017-07-26 ENCOUNTER — Telehealth: Payer: Self-pay | Admitting: Physician Assistant

## 2017-07-26 DIAGNOSIS — M25511 Pain in right shoulder: Principal | ICD-10-CM

## 2017-07-26 DIAGNOSIS — G8929 Other chronic pain: Secondary | ICD-10-CM

## 2017-07-26 MED ORDER — HYDROCODONE-ACETAMINOPHEN 5-325 MG PO TABS: 1.0000 | ORAL_TABLET | Freq: Three times a day (TID) | ORAL | 0 refills | Status: DC | PRN

## 2017-07-26 NOTE — Telephone Encounter (Signed)
Rx refilled.

## 2017-07-26 NOTE — Telephone Encounter (Signed)
Patient called request to get a refill for her Hydrocodone. Pt has a very bad swollen knee and her prescription ran out on July 26th. Pt states that she has seen her Ortho doctor and he offered to write a refill for her but she told him no because she hd already sign a pain contract with our office. She has been trying to speak with someone in Triage and know one is calling her back or answering the phone just rings and rings. Her Ortho doctor has now referred her to a ortho surgeon in [place] in Aug but she still needs a refill asap and adv that 1 pill that is written on the instructions is not helping with her pain, her knee has gotten worse and more painful. Patient request for a call back today from nurse about getting a refill. Thanks

## 2017-07-27 ENCOUNTER — Telehealth: Payer: Self-pay | Admitting: Physician Assistant

## 2017-07-27 NOTE — Telephone Encounter (Signed)
Patient called adv that she got a Hep B injec in June 2018 and was told she need to get another Hep B sometime in August and in 6 months come back for another injec. Patient wants to confirm if that is correct and in 6 months is it for a Hep B as well. Please call pt and adv

## 2017-07-27 NOTE — Telephone Encounter (Signed)
Patient notified

## 2017-07-29 DIAGNOSIS — D510 Vitamin B12 deficiency anemia due to intrinsic factor deficiency: Secondary | ICD-10-CM | POA: Diagnosis not present

## 2017-08-01 DIAGNOSIS — D510 Vitamin B12 deficiency anemia due to intrinsic factor deficiency: Secondary | ICD-10-CM | POA: Diagnosis not present

## 2017-08-02 DIAGNOSIS — D510 Vitamin B12 deficiency anemia due to intrinsic factor deficiency: Secondary | ICD-10-CM | POA: Diagnosis not present

## 2017-08-02 DIAGNOSIS — J3489 Other specified disorders of nose and nasal sinuses: Secondary | ICD-10-CM | POA: Diagnosis not present

## 2017-08-03 DIAGNOSIS — D510 Vitamin B12 deficiency anemia due to intrinsic factor deficiency: Secondary | ICD-10-CM | POA: Diagnosis not present

## 2017-08-04 DIAGNOSIS — D510 Vitamin B12 deficiency anemia due to intrinsic factor deficiency: Secondary | ICD-10-CM | POA: Diagnosis not present

## 2017-08-08 DIAGNOSIS — M1712 Unilateral primary osteoarthritis, left knee: Secondary | ICD-10-CM | POA: Diagnosis not present

## 2017-08-08 DIAGNOSIS — M79672 Pain in left foot: Secondary | ICD-10-CM | POA: Diagnosis not present

## 2017-08-08 DIAGNOSIS — T84012A Broken internal right knee prosthesis, initial encounter: Secondary | ICD-10-CM | POA: Diagnosis not present

## 2017-08-10 ENCOUNTER — Ambulatory Visit: Payer: Medicare Other | Admitting: Physician Assistant

## 2017-08-10 DIAGNOSIS — G4733 Obstructive sleep apnea (adult) (pediatric): Secondary | ICD-10-CM | POA: Diagnosis not present

## 2017-08-12 ENCOUNTER — Ambulatory Visit: Payer: Medicare Other

## 2017-08-15 ENCOUNTER — Ambulatory Visit (INDEPENDENT_AMBULATORY_CARE_PROVIDER_SITE_OTHER): Payer: Medicare Other | Admitting: Physician Assistant

## 2017-08-15 VITALS — BP 116/63 | HR 74 | Temp 98.1°F

## 2017-08-15 DIAGNOSIS — M25511 Pain in right shoulder: Secondary | ICD-10-CM | POA: Diagnosis not present

## 2017-08-15 DIAGNOSIS — G8929 Other chronic pain: Secondary | ICD-10-CM

## 2017-08-15 DIAGNOSIS — D510 Vitamin B12 deficiency anemia due to intrinsic factor deficiency: Secondary | ICD-10-CM | POA: Diagnosis not present

## 2017-08-15 DIAGNOSIS — Z23 Encounter for immunization: Secondary | ICD-10-CM

## 2017-08-15 MED ORDER — HYDROCODONE-ACETAMINOPHEN 5-325 MG PO TABS: 1.0000 | ORAL_TABLET | Freq: Three times a day (TID) | ORAL | 0 refills | Status: DC | PRN

## 2017-08-15 NOTE — Progress Notes (Signed)
Pt came into clinic today for 2nd Hep B immunization. Pt tolerated injection in left deltoid well, no immediate complications. Advised to return in December to complete series.  While in office, Pt reported the following: Her oncologist discovered her Vit B12 and Vit D levels were low, they have started her on B12 injections and will continue monitoring levels. She was seen by an orthopedist for a foot infection and OA of her knee. States they refused to give her a TKA based on her BMI.  Pt requesting refill on pain Rx early, states the orthopedist offered her an Rx but she declined due to pain contract with PCP. Per PCP, one time quantity increase approved. Pt was added to schedule with PCP for an OV to discuss all other concerns.   Agree with above plan. Iran Planas PA-c

## 2017-08-23 DIAGNOSIS — D510 Vitamin B12 deficiency anemia due to intrinsic factor deficiency: Secondary | ICD-10-CM | POA: Diagnosis not present

## 2017-08-26 ENCOUNTER — Encounter: Payer: Self-pay | Admitting: Physician Assistant

## 2017-08-26 ENCOUNTER — Ambulatory Visit (INDEPENDENT_AMBULATORY_CARE_PROVIDER_SITE_OTHER): Payer: Medicare Other | Admitting: Physician Assistant

## 2017-08-26 VITALS — BP 133/66 | HR 95 | Wt 289.0 lb

## 2017-08-26 DIAGNOSIS — E559 Vitamin D deficiency, unspecified: Secondary | ICD-10-CM | POA: Diagnosis not present

## 2017-08-26 DIAGNOSIS — F331 Major depressive disorder, recurrent, moderate: Secondary | ICD-10-CM

## 2017-08-26 DIAGNOSIS — R51 Headache: Secondary | ICD-10-CM

## 2017-08-26 DIAGNOSIS — I10 Essential (primary) hypertension: Secondary | ICD-10-CM | POA: Diagnosis not present

## 2017-08-26 DIAGNOSIS — E119 Type 2 diabetes mellitus without complications: Secondary | ICD-10-CM

## 2017-08-26 DIAGNOSIS — R600 Localized edema: Secondary | ICD-10-CM

## 2017-08-26 DIAGNOSIS — R61 Generalized hyperhidrosis: Secondary | ICD-10-CM | POA: Diagnosis not present

## 2017-08-26 DIAGNOSIS — F5101 Primary insomnia: Secondary | ICD-10-CM | POA: Diagnosis not present

## 2017-08-26 DIAGNOSIS — J42 Unspecified chronic bronchitis: Secondary | ICD-10-CM

## 2017-08-26 DIAGNOSIS — Z1211 Encounter for screening for malignant neoplasm of colon: Secondary | ICD-10-CM

## 2017-08-26 DIAGNOSIS — I700 Atherosclerosis of aorta: Secondary | ICD-10-CM

## 2017-08-26 DIAGNOSIS — I872 Venous insufficiency (chronic) (peripheral): Secondary | ICD-10-CM

## 2017-08-26 DIAGNOSIS — Z23 Encounter for immunization: Secondary | ICD-10-CM

## 2017-08-26 DIAGNOSIS — R519 Headache, unspecified: Secondary | ICD-10-CM

## 2017-08-26 DIAGNOSIS — R002 Palpitations: Secondary | ICD-10-CM

## 2017-08-26 DIAGNOSIS — K219 Gastro-esophageal reflux disease without esophagitis: Secondary | ICD-10-CM | POA: Diagnosis not present

## 2017-08-26 DIAGNOSIS — D510 Vitamin B12 deficiency anemia due to intrinsic factor deficiency: Secondary | ICD-10-CM

## 2017-08-26 LAB — CBC
HCT: 32.3 % — ABNORMAL LOW (ref 35.0–45.0)
Hemoglobin: 10.5 g/dL — ABNORMAL LOW (ref 11.7–15.5)
MCH: 30.3 pg (ref 27.0–33.0)
MCHC: 32.5 g/dL (ref 32.0–36.0)
MCV: 93.1 fL (ref 80.0–100.0)
MPV: 10.1 fL (ref 7.5–12.5)
Platelets: 202 10*3/uL (ref 140–400)
RBC: 3.47 MIL/uL — ABNORMAL LOW (ref 3.80–5.10)
RDW: 15.2 % — ABNORMAL HIGH (ref 11.0–15.0)
WBC: 11.1 10*3/uL — ABNORMAL HIGH (ref 3.8–10.8)

## 2017-08-26 MED ORDER — CITALOPRAM HYDROBROMIDE 40 MG PO TABS: 40.0000 mg | ORAL_TABLET | Freq: Every day | ORAL | 1 refills | Status: DC

## 2017-08-26 MED ORDER — SPIRONOLACTONE 25 MG PO TABS: 25.0000 mg | ORAL_TABLET | Freq: Every day | ORAL | 1 refills | Status: DC

## 2017-08-26 MED ORDER — METFORMIN HCL 1000 MG PO TABS: 1000.0000 mg | ORAL_TABLET | Freq: Two times a day (BID) | ORAL | 2 refills | Status: DC

## 2017-08-26 MED ORDER — TOPIRAMATE 50 MG PO TABS: 200.0000 mg | ORAL_TABLET | Freq: Every day | ORAL | 1 refills | Status: DC

## 2017-08-26 MED ORDER — DULOXETINE HCL 60 MG PO CPEP: ORAL_CAPSULE | ORAL | 1 refills | Status: DC

## 2017-08-26 MED ORDER — FUROSEMIDE 40 MG PO TABS: 40.0000 mg | ORAL_TABLET | Freq: Every day | ORAL | 1 refills | Status: DC

## 2017-08-26 MED ORDER — POTASSIUM CHLORIDE CRYS ER 20 MEQ PO TBCR: 10.0000 meq | EXTENDED_RELEASE_TABLET | Freq: Every day | ORAL | 1 refills | Status: DC

## 2017-08-26 MED ORDER — GLIPIZIDE 5 MG PO TABS: ORAL_TABLET | ORAL | 1 refills | Status: DC

## 2017-08-26 MED ORDER — OMEPRAZOLE 40 MG PO CPDR: 40.0000 mg | DELAYED_RELEASE_CAPSULE | Freq: Every day | ORAL | 3 refills | Status: DC

## 2017-08-26 MED ORDER — TEMAZEPAM 7.5 MG PO CAPS: 7.5000 mg | ORAL_CAPSULE | Freq: Every evening | ORAL | 1 refills | Status: DC | PRN

## 2017-08-26 MED ORDER — LOSARTAN POTASSIUM 25 MG PO TABS: 25.0000 mg | ORAL_TABLET | Freq: Every day | ORAL | 1 refills | Status: DC

## 2017-08-26 MED ORDER — BUDESONIDE-FORMOTEROL FUMARATE 160-4.5 MCG/ACT IN AERO: 2.0000 | INHALATION_SPRAY | Freq: Two times a day (BID) | RESPIRATORY_TRACT | 3 refills | Status: DC

## 2017-08-26 MED ORDER — ATORVASTATIN CALCIUM 40 MG PO TABS: 40.0000 mg | ORAL_TABLET | Freq: Every day | ORAL | 1 refills | Status: DC

## 2017-08-26 NOTE — Progress Notes (Signed)
Subjective:    Patient ID: [name], female    DOB: [birth year], 66 y.o.   MRN: [id number]  HPI  Pt is a 66 yo morbidly obese female who presents for 3 month follow up.   She would like referral to Dr. Christell Faith for colonoscopy.   She would like referral to her previous cardiologist, Dr. Ferdinand Lango. She has started to have some palpitation feelings and sweating profusely at times. Resolves on its on.   Pt continues to see Dr. Latanya Maudlin for joints. He will not do surgery until she has lost at least 85lbs. She has lost 10lbs over last few months. She continues to take hydrocodone for left knee pain that needs replacement. She is under pain contract.   DM- not checking sugars. Denies any hypoglycemia. No open sores or wounds. She is trying to limit portion sizes and work on weight loss. Cannot exercise due to knee pain.   .. Active Ambulatory Problems    Diagnosis Date Noted  . History of pulmonary embolism 03/20/2014  . Type II diabetes mellitus, well controlled ([place]) 03/20/2014  . Morbidly obese ([place]) 03/20/2014  . Family history of heart disease 03/20/2014  . Bunion, right foot 03/20/2014  . Frequent headaches 07/20/2016  . Macular degeneration, dry 07/20/2016  . No energy 07/21/2016  . OSA on CPAP 07/21/2016  . Closed fracture of nasal bone 07/21/2016  . Hyperlipidemia 07/21/2016  . Depression 07/21/2016  . Gastroesophageal reflux disease without esophagitis 07/21/2016  . Facial pain 07/21/2016  . Chronic bronchitis ([place]) 07/21/2016  . Bilateral edema of lower extremity 07/21/2016  . B12 deficiency 07/21/2016  . Iron deficiency anemia 07/21/2016  . CKD (chronic kidney disease), stage III 08/04/2016  . Insomnia 08/04/2016  . Hypertriglyceridemia 08/06/2016  . Diabetic polyneuropathy associated with diabetes mellitus due to underlying condition ([place]) 11/07/2016  . Vitamin D deficiency 11/07/2016  . Chronic right shoulder pain 02/06/2017  . RLS (restless legs syndrome)  02/06/2017  . Essential hypertension 02/06/2017  . Left hand pain 02/06/2017  . Post-menopausal 02/06/2017  . Anxiety 02/06/2017  . Osteopenia 03/01/2017  . Aortic atherosclerosis ([place]) 03/16/2017  . Liver nodule 03/25/2017  . Chronic venous stasis dermatitis of both lower extremities 03/25/2017  . Chronic pain of left knee 05/12/2017  . Sweating profusely 08/29/2017  . Pernicious anemia 08/29/2017   Resolved Ambulatory Problems    Diagnosis Date Noted  . No Resolved Ambulatory Problems   Past Medical History:  Diagnosis Date  . Anemia   . Anxiety   . Arthritis   . Cataracts, bilateral   . Chronic kidney disease   . Diabetes ([place])   . Family history of coronary artery disease   . Fatty liver   . GERD (gastroesophageal reflux disease)   . Headache   . Morbid obesity ([place])   . Peripheral vascular disease ([place])   . PONV (postoperative nausea and vomiting)   . Pulmonary embolism ([place]) 2001  . Restless leg syndrome   . Shortness of breath dyspnea   . Sleep apnea   . Venous stasis dermatitis of both lower extremities      Review of Systems  All other systems reviewed and are negative.      Objective:   Physical Exam  Constitutional: She is oriented to person, place, and time. She appears well-developed and well-nourished.  Morbidly obese.   HENT:  Head: Normocephalic and atraumatic.  Cardiovascular: Normal rate, regular rhythm and normal heart sounds.   Pulmonary/Chest: Effort normal and breath  sounds normal. She has no wheezes.  Neurological: She is alert and oriented to person, place, and time.  Psychiatric: She has a normal mood and affect. Her behavior is normal.          Assessment & Plan:  [name] KitchenMarland KitchenDoris was seen today for f/u meds.  Diagnoses and all orders for this visit:  Type II diabetes mellitus, well controlled ([place]) -     POCT HgB A1C -     glipiZIDE (GLUCOTROL) 5 MG tablet; TAKE ONE TABLET BY MOUTH ONCE DAILY BEFORE BREAKFAST -     metFORMIN  (GLUCOPHAGE) 1000 MG tablet; Take 1 tablet (1,000 mg total) by mouth 2 (two) times daily with a meal. -     COMPLETE METABOLIC PANEL WITH GFR  Aortic atherosclerosis (HCC) -     atorvastatin (LIPITOR) 40 MG tablet; Take 1 tablet (40 mg total) by mouth daily. -     Lipid Panel w/reflex Direct LDL  Chronic venous stasis dermatitis of both lower extremities -     furosemide (LASIX) 40 MG tablet; Take 1 tablet (40 mg total) by mouth daily.  Bilateral edema of lower extremity -     furosemide (LASIX) 40 MG tablet; Take 1 tablet (40 mg total) by mouth daily.  Chronic bronchitis, unspecified chronic bronchitis type (HCC) -     budesonide-formoterol (SYMBICORT) 160-4.5 MCG/ACT inhaler; Inhale 2 puffs into the lungs 2 (two) times daily.  Essential hypertension -     losartan (COZAAR) 25 MG tablet; Take 1 tablet (25 mg total) by mouth daily. -     spironolactone (ALDACTONE) 25 MG tablet; Take 1 tablet (25 mg total) by mouth daily.  Frequent headaches -     topiramate (TOPAMAX) 50 MG tablet; Take 4 tablets (200 mg total) by mouth at bedtime.  Gastroesophageal reflux disease, esophagitis presence not specified -     omeprazole (PRILOSEC) 40 MG capsule; Take 1 capsule (40 mg total) by mouth daily.  Moderate episode of recurrent major depressive disorder (HCC) -     citalopram (CELEXA) 40 MG tablet; Take 1 tablet (40 mg total) by mouth daily. -     DULoxetine (CYMBALTA) 60 MG capsule; 2 po daily  Primary insomnia -     temazepam (RESTORIL) 7.5 MG capsule; Take 1 capsule (7.5 mg total) by mouth at bedtime as needed for sleep.  Pernicious anemia -     CBC -     B12 -     VITAMIN D 25 Hydroxy (Vit-D Deficiency, Fractures)  Vitamin D deficiency -     VITAMIN D 25 Hydroxy (Vit-D Deficiency, Fractures)  Sweating profusely -     TSH -     Ambulatory referral to Cardiology  Colon cancer screening -     Ambulatory referral to Gastroenterology  Palpitation -     Ambulatory referral to  Cardiology  Morbidly obese ([place])  Other orders -     potassium chloride SA (K-DUR,KLOR-CON) 20 MEQ tablet; Take 0.5 tablets (10 mEq total) by mouth daily.   Referrals made at patients request.   .. Lab Results  Component Value Date   HGBA1C 5.4 08/30/2017     A!C is great and controlled.  Needs eye exam.  ..Discussed low carb diet with 1500 calories and 80g of protein.  Exercising at least 150 minutes a week.  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  fOllow up in 3 months.  Pt was given prevnar 13 and flu shot today.    Pt  has lost 10lbs this is great. Goal is to be able to have surgery to get knee replaced.  Pt has up to date pain contract.  [place] controlled substance database reviewed with no concerns. Pt does not need refill at this time.

## 2017-08-27 LAB — COMPLETE METABOLIC PANEL WITH GFR
ALT: 10 U/L (ref 6–29)
AST: 11 U/L (ref 10–35)
Albumin: 3.7 g/dL (ref 3.6–5.1)
Alkaline Phosphatase: 84 U/L (ref 33–130)
BUN: 23 mg/dL (ref 7–25)
CO2: 21 mmol/L (ref 20–32)
Calcium: 8.6 mg/dL (ref 8.6–10.4)
Chloride: 104 mmol/L (ref 98–110)
Creat: 1.44 mg/dL — ABNORMAL HIGH (ref 0.50–0.99)
GFR, Est African American: 44 mL/min — ABNORMAL LOW (ref >=60)
GFR, Est Non African American: 38 mL/min — ABNORMAL LOW (ref >=60)
Glucose, Bld: 65 mg/dL (ref 65–99)
Potassium: 3.9 mmol/L (ref 3.5–5.3)
Sodium: 138 mmol/L (ref 135–146)
Total Bilirubin: 0.4 mg/dL (ref 0.2–1.2)
Total Protein: 6.6 g/dL (ref 6.1–8.1)

## 2017-08-27 LAB — LIPID PANEL W/REFLEX DIRECT LDL
Cholesterol: 157 mg/dL (ref <200)
HDL: 55 mg/dL (ref >50)
LDL-Cholesterol: 74 mg/dL
Non-HDL Cholesterol (Calc): 102 mg/dL (ref <130)
Total CHOL/HDL Ratio: 2.9 Ratio (ref <5.0)
Triglycerides: 179 mg/dL — ABNORMAL HIGH (ref <150)

## 2017-08-27 LAB — TSH: TSH: 4.19 mIU/L

## 2017-08-27 LAB — VITAMIN D 25 HYDROXY (VIT D DEFICIENCY, FRACTURES): Vit D, 25-Hydroxy: 26 ng/mL — ABNORMAL LOW (ref 30–100)

## 2017-08-27 LAB — VITAMIN B12: Vitamin B-12: 1692 pg/mL — ABNORMAL HIGH (ref 200–1100)

## 2017-08-29 ENCOUNTER — Telehealth: Payer: Self-pay | Admitting: Physician Assistant

## 2017-08-29 DIAGNOSIS — D510 Vitamin B12 deficiency anemia due to intrinsic factor deficiency: Secondary | ICD-10-CM | POA: Insufficient documentation

## 2017-08-29 DIAGNOSIS — R61 Generalized hyperhidrosis: Secondary | ICD-10-CM | POA: Insufficient documentation

## 2017-08-29 NOTE — Telephone Encounter (Signed)
I cannot close chart because a1C order has not been resulted.

## 2017-08-29 NOTE — Telephone Encounter (Signed)
I am pretty sure patient got flu and pneumonia vaccine? I don't see if recorded?

## 2017-08-30 LAB — POCT GLYCOSYLATED HEMOGLOBIN (HGB A1C): Hemoglobin A1C: 5.4

## 2017-08-30 NOTE — Telephone Encounter (Signed)
Please add flu shot and pneumonia vaccine given on Friday.

## 2017-08-31 NOTE — Addendum Note (Signed)
Addended by: Narda Rutherford on: 08/31/2017 10:04 AM   Modules accepted: Orders

## 2017-08-31 NOTE — Telephone Encounter (Signed)
Entered

## 2017-09-01 DIAGNOSIS — D510 Vitamin B12 deficiency anemia due to intrinsic factor deficiency: Secondary | ICD-10-CM | POA: Diagnosis not present

## 2017-09-07 ENCOUNTER — Other Ambulatory Visit: Payer: Self-pay | Admitting: Physician Assistant

## 2017-09-07 DIAGNOSIS — F419 Anxiety disorder, unspecified: Secondary | ICD-10-CM

## 2017-09-07 NOTE — Telephone Encounter (Signed)
Are you ok with this refill request?.Charrie Mcconnon, Lahoma Crocker

## 2017-09-16 ENCOUNTER — Telehealth: Payer: Self-pay

## 2017-09-16 NOTE — Telephone Encounter (Signed)
I understand her frustration but prescribing medication without visit is not good medicine. At times symptoms could be worse than what patient reports or much better. It can often harm you to just repeat what you have done before. If you had a septic knee that could even need hospitalization of different abx.

## 2017-09-16 NOTE — Telephone Encounter (Signed)
Leighla called and requested that [facility] send in an antibiotic and prednisone for the swelling in her knee as prescribed last month by Ortho. She states [name] knows all about it and she will follow up next week with Jade.   I spoke with [name], she suggested she follow back up with Ortho.   I told patient she needs to follow up with Ortho. She states she called and they were unable to fit her in today. I asked her if she left a message about her symptoms. She said no because they take 24 hours to return her calls. I advised patient to call them back so to leave a message stating her symptoms have not resolved. She then said they did resolve but since all the rain we have had her symptoms returned. I then told her the swelling may not be because her knee is infected and we would need to see her in order to treat the swelling in her knee. She then requested that [facility] send in a prescription for just the prednisone to get the fluid off her knee and went on to talk about how she has lasix and it doesn't work half the time. I again offered her another appointment today. She refused and stated she would have to just follow up next week with Ortho. She also said "[name] knows I won't ask for anything that is not good for me. This is plum ridiculous. [name] knows all about what is going on with me. I guess I will have to find a provider that I don't have to explain things to 50 times."

## 2017-09-27 ENCOUNTER — Telehealth: Payer: Self-pay | Admitting: Physician Assistant

## 2017-09-27 ENCOUNTER — Other Ambulatory Visit: Payer: Self-pay | Admitting: Physician Assistant

## 2017-09-27 DIAGNOSIS — M25511 Pain in right shoulder: Principal | ICD-10-CM

## 2017-09-27 DIAGNOSIS — G8929 Other chronic pain: Secondary | ICD-10-CM

## 2017-09-27 MED ORDER — HYDROCODONE-ACETAMINOPHEN 5-325 MG PO TABS: 1.0000 | ORAL_TABLET | Freq: Three times a day (TID) | ORAL | 0 refills | Status: DC | PRN

## 2017-09-27 NOTE — Telephone Encounter (Addendum)
Pt called. She is requesting refill on hydrocodone.  She wants to come in tomorrow to pick script up.Thank you.

## 2017-09-27 NOTE — Telephone Encounter (Signed)
Done. Decreased back down to 45 since was increased due to acute pain. Ready for pick up.

## 2017-09-28 NOTE — Telephone Encounter (Signed)
Patient advised that the Rx is ready for pick up.

## 2017-10-04 DIAGNOSIS — E1121 Type 2 diabetes mellitus with diabetic nephropathy: Secondary | ICD-10-CM | POA: Diagnosis not present

## 2017-10-04 DIAGNOSIS — J3489 Other specified disorders of nose and nasal sinuses: Secondary | ICD-10-CM | POA: Diagnosis not present

## 2017-10-04 DIAGNOSIS — E559 Vitamin D deficiency, unspecified: Secondary | ICD-10-CM | POA: Diagnosis not present

## 2017-10-04 DIAGNOSIS — R5383 Other fatigue: Secondary | ICD-10-CM | POA: Diagnosis not present

## 2017-10-04 DIAGNOSIS — D500 Iron deficiency anemia secondary to blood loss (chronic): Secondary | ICD-10-CM | POA: Diagnosis not present

## 2017-10-04 DIAGNOSIS — Z794 Long term (current) use of insulin: Secondary | ICD-10-CM | POA: Diagnosis not present

## 2017-10-05 ENCOUNTER — Ambulatory Visit: Payer: Medicare Other | Admitting: Physician Assistant

## 2017-10-17 DIAGNOSIS — N183 Chronic kidney disease, stage 3 (moderate): Secondary | ICD-10-CM | POA: Diagnosis not present

## 2017-10-17 DIAGNOSIS — I10 Essential (primary) hypertension: Secondary | ICD-10-CM | POA: Diagnosis not present

## 2017-10-18 DIAGNOSIS — M2031 Hallux varus (acquired), right foot: Secondary | ICD-10-CM | POA: Diagnosis not present

## 2017-10-18 DIAGNOSIS — E119 Type 2 diabetes mellitus without complications: Secondary | ICD-10-CM | POA: Diagnosis not present

## 2017-10-18 DIAGNOSIS — D1631 Benign neoplasm of short bones of right lower limb: Secondary | ICD-10-CM | POA: Diagnosis not present

## 2017-10-18 DIAGNOSIS — M7671 Peroneal tendinitis, right leg: Secondary | ICD-10-CM | POA: Diagnosis not present

## 2017-10-18 DIAGNOSIS — M25571 Pain in right ankle and joints of right foot: Secondary | ICD-10-CM | POA: Diagnosis not present

## 2017-10-18 DIAGNOSIS — M79671 Pain in right foot: Secondary | ICD-10-CM | POA: Diagnosis not present

## 2017-10-18 DIAGNOSIS — M2011 Hallux valgus (acquired), right foot: Secondary | ICD-10-CM | POA: Diagnosis not present

## 2017-10-18 DIAGNOSIS — M25371 Other instability, right ankle: Secondary | ICD-10-CM | POA: Diagnosis not present

## 2017-11-02 ENCOUNTER — Other Ambulatory Visit: Payer: Self-pay

## 2017-11-02 DIAGNOSIS — G8929 Other chronic pain: Secondary | ICD-10-CM

## 2017-11-02 DIAGNOSIS — D500 Iron deficiency anemia secondary to blood loss (chronic): Secondary | ICD-10-CM | POA: Diagnosis not present

## 2017-11-02 DIAGNOSIS — D510 Vitamin B12 deficiency anemia due to intrinsic factor deficiency: Secondary | ICD-10-CM | POA: Diagnosis not present

## 2017-11-02 DIAGNOSIS — M25511 Pain in right shoulder: Principal | ICD-10-CM

## 2017-11-02 MED ORDER — HYDROCODONE-ACETAMINOPHEN 5-325 MG PO TABS: 1.0000 | ORAL_TABLET | Freq: Three times a day (TID) | ORAL | 0 refills | Status: DC | PRN

## 2017-11-03 ENCOUNTER — Telehealth: Payer: Self-pay

## 2017-11-03 NOTE — Telephone Encounter (Signed)
Sharee Holster picked up script for pt @ 152pm 11/03/17

## 2017-11-03 NOTE — Telephone Encounter (Signed)
error 

## 2017-11-07 ENCOUNTER — Other Ambulatory Visit: Payer: Self-pay | Admitting: *Deleted

## 2017-11-07 ENCOUNTER — Other Ambulatory Visit: Payer: Self-pay

## 2017-11-07 ENCOUNTER — Telehealth: Payer: Self-pay

## 2017-11-07 MED ORDER — PREDNISONE 50 MG PO TABS: 50.0000 mg | ORAL_TABLET | Freq: Every day | ORAL | 0 refills | Status: DC

## 2017-11-07 NOTE — Telephone Encounter (Signed)
Pt has orthopedic that she should call for knee pain. Prednisone burst are not apart of protocol for as needed joint pain. I will give you 5 days but you need to work out with orthopedics (if this is how they treat your exacerbations) a treatment plan or way to get in contact with them.   Ok for prednisone 50mg  one tablet for 5 days #5 NRF

## 2017-11-07 NOTE — Telephone Encounter (Signed)
[name] states she is having terrible joint pain and would like a round of prednisone. Please advise.

## 2017-11-08 NOTE — Telephone Encounter (Signed)
Not for acute knee pain. And if ortho treats with burst of oral prednisone for knee then can they write a letter for [place] to keep on file stating this is appropriate.

## 2017-11-08 NOTE — Telephone Encounter (Signed)
Patient advised. She states the Ortho office recommended she call us for all her problems.

## 2017-11-16 DIAGNOSIS — Z1211 Encounter for screening for malignant neoplasm of colon: Secondary | ICD-10-CM | POA: Diagnosis not present

## 2017-11-16 DIAGNOSIS — J449 Chronic obstructive pulmonary disease, unspecified: Secondary | ICD-10-CM | POA: Diagnosis not present

## 2017-11-16 DIAGNOSIS — E119 Type 2 diabetes mellitus without complications: Secondary | ICD-10-CM | POA: Diagnosis not present

## 2017-11-16 LAB — HM COLONOSCOPY

## 2017-11-23 ENCOUNTER — Ambulatory Visit (INDEPENDENT_AMBULATORY_CARE_PROVIDER_SITE_OTHER): Payer: Medicare Other | Admitting: Physician Assistant

## 2017-11-23 VITALS — BP 89/56 | HR 101 | Ht 60.0 in

## 2017-11-23 DIAGNOSIS — E119 Type 2 diabetes mellitus without complications: Secondary | ICD-10-CM

## 2017-11-23 DIAGNOSIS — J42 Unspecified chronic bronchitis: Secondary | ICD-10-CM | POA: Diagnosis not present

## 2017-11-23 DIAGNOSIS — R140 Abdominal distension (gaseous): Secondary | ICD-10-CM

## 2017-11-23 DIAGNOSIS — L03116 Cellulitis of left lower limb: Secondary | ICD-10-CM | POA: Diagnosis not present

## 2017-11-23 DIAGNOSIS — I10 Essential (primary) hypertension: Secondary | ICD-10-CM | POA: Diagnosis not present

## 2017-11-23 DIAGNOSIS — M150 Primary generalized (osteo)arthritis: Secondary | ICD-10-CM

## 2017-11-23 DIAGNOSIS — M159 Polyosteoarthritis, unspecified: Secondary | ICD-10-CM

## 2017-11-23 DIAGNOSIS — R5383 Other fatigue: Secondary | ICD-10-CM | POA: Diagnosis not present

## 2017-11-23 DIAGNOSIS — M8949 Other hypertrophic osteoarthropathy, multiple sites: Secondary | ICD-10-CM

## 2017-11-23 DIAGNOSIS — E861 Hypovolemia: Secondary | ICD-10-CM

## 2017-11-23 DIAGNOSIS — Z79899 Other long term (current) drug therapy: Secondary | ICD-10-CM | POA: Diagnosis not present

## 2017-11-23 DIAGNOSIS — F419 Anxiety disorder, unspecified: Secondary | ICD-10-CM | POA: Diagnosis not present

## 2017-11-23 DIAGNOSIS — E7800 Pure hypercholesterolemia, unspecified: Secondary | ICD-10-CM | POA: Diagnosis not present

## 2017-11-23 DIAGNOSIS — I872 Venous insufficiency (chronic) (peripheral): Secondary | ICD-10-CM

## 2017-11-23 DIAGNOSIS — E559 Vitamin D deficiency, unspecified: Secondary | ICD-10-CM

## 2017-11-23 DIAGNOSIS — Z23 Encounter for immunization: Secondary | ICD-10-CM

## 2017-11-23 DIAGNOSIS — R1031 Right lower quadrant pain: Secondary | ICD-10-CM

## 2017-11-23 DIAGNOSIS — Z1211 Encounter for screening for malignant neoplasm of colon: Secondary | ICD-10-CM

## 2017-11-23 DIAGNOSIS — I9589 Other hypotension: Secondary | ICD-10-CM

## 2017-11-23 LAB — POCT GLYCOSYLATED HEMOGLOBIN (HGB A1C): Hemoglobin A1C: 6

## 2017-11-23 MED ORDER — ALBUTEROL SULFATE HFA 108 (90 BASE) MCG/ACT IN AERS: 1.0000 | INHALATION_SPRAY | Freq: Four times a day (QID) | RESPIRATORY_TRACT | 5 refills | Status: DC | PRN

## 2017-11-23 MED ORDER — DOXYCYCLINE HYCLATE 100 MG PO TABS: 100.0000 mg | ORAL_TABLET | Freq: Two times a day (BID) | ORAL | 0 refills | Status: DC

## 2017-11-23 MED ORDER — BUMETANIDE 1 MG PO TABS: 1.0000 mg | ORAL_TABLET | Freq: Two times a day (BID) | ORAL | 1 refills | Status: DC

## 2017-11-23 MED ORDER — ALPRAZOLAM 1 MG PO TABS: 1.0000 mg | ORAL_TABLET | Freq: Two times a day (BID) | ORAL | 2 refills | Status: DC | PRN

## 2017-11-23 NOTE — Progress Notes (Signed)
Subjective:    Patient ID: [name], female    DOB: Dec 09, 1951, 66 y.o.   MRN: [id number]  HPI  Pt is a 66 yo morbidly obese pleasant female with COPD, DM, OA who presents to the clinic for her 3 month DM check.   Pt has multiple specialist that she sees to coordinate care.   DM- she is not checking her sugars. She admits to only eating one meal a day. She wants to lose weight. Denies any hypoglycemia. She does have a tiny sore on top of 2nd metatarsal. It ssems to be healing.   Pt feels distended and bloated in the right side of her abdomen. She "feels like something is pushing through". Started last year when she had a cough for many months. She felt something pull and now she feels like it continues to get worse and worse. She feels more pressure than pain. She wonders if it could be hernia.   Her left anterior lower leg is red and warm to touch. She has noticed more swelling in that leg over past few days. No fever, chills, SOB.   She would like to discuss as needed prednisone burst for 5 days for her OA in knees. She states this is only thing that helps.  .. Active Ambulatory Problems    Diagnosis Date Noted  . History of pulmonary embolism 03/20/2014  . Type II diabetes mellitus, well controlled ([place]) 03/20/2014  . Morbidly obese ([place]) 03/20/2014  . Family history of heart disease 03/20/2014  . Bunion, right foot 03/20/2014  . Frequent headaches 07/20/2016  . Macular degeneration, dry 07/20/2016  . No energy 07/21/2016  . OSA on CPAP 07/21/2016  . Closed fracture of nasal bone 07/21/2016  . Hyperlipidemia 07/21/2016  . Depression 07/21/2016  . Gastroesophageal reflux disease without esophagitis 07/21/2016  . Facial pain 07/21/2016  . Chronic bronchitis ([place]) 07/21/2016  . Bilateral edema of lower extremity 07/21/2016  . B12 deficiency 07/21/2016  . Iron deficiency anemia 07/21/2016  . CKD (chronic kidney disease), stage III ([place]) 08/04/2016  . Insomnia  08/04/2016  . Hypertriglyceridemia 08/06/2016  . Diabetic polyneuropathy associated with diabetes mellitus due to underlying condition ([place]) 11/07/2016  . Vitamin D deficiency 11/07/2016  . Chronic right shoulder pain 02/06/2017  . RLS (restless legs syndrome) 02/06/2017  . Essential hypertension 02/06/2017  . Left hand pain 02/06/2017  . Post-menopausal 02/06/2017  . Anxiety 02/06/2017  . Osteopenia 03/01/2017  . Aortic atherosclerosis ([place]) 03/16/2017  . Liver nodule 03/25/2017  . Chronic venous stasis dermatitis of both lower extremities 03/25/2017  . Chronic pain of left knee 05/12/2017  . Sweating profusely 08/29/2017  . Pernicious anemia 08/29/2017  . Primary osteoarthritis involving multiple joints 11/25/2017  . Cellulitis of left lower extremity 11/25/2017   Resolved Ambulatory Problems    Diagnosis Date Noted  . No Resolved Ambulatory Problems   Past Medical History:  Diagnosis Date  . Anemia   . Anxiety   . Arthritis   . Cataracts, bilateral   . Chronic kidney disease   . Diabetes ([place])   . Family history of coronary artery disease   . Fatty liver   . GERD (gastroesophageal reflux disease)   . Headache   . Morbid obesity ([place])   . Peripheral vascular disease ([place])   . PONV (postoperative nausea and vomiting)   . Pulmonary embolism ([place]) 2001  . Restless leg syndrome   . Shortness of breath dyspnea   . Sleep apnea   .  Venous stasis dermatitis of both lower extremities         Review of Systems     Objective:   Physical Exam  Constitutional: She is oriented to person, place, and time.  Morbidly obese in wheelchair.   HENT:  Head: Normocephalic and atraumatic.  Cardiovascular: Normal rate and regular rhythm.  Pulmonary/Chest: Effort normal and breath sounds normal. She has no wheezes.  Abdominal: Soft.  Distended abdomen overall.  I cannot paplpate any hernia. I do not see any visual hernia.  She does express tenderness over right lower quadrant  to palpation but not guarding or rebound.   Neurological: She is alert and oriented to person, place, and time.  Skin:  Left anterior lower leg erythematous with 2+pitting edema. Tender and warm to touch.   1cm by 1/2cm erythematous superficial sore on top of 2nd metatarsal.   Psychiatric: She has a normal mood and affect. Her behavior is normal.          Assessment & Plan:  [name] KitchenMarland KitchenDoris was seen today for diabetes.  Diagnoses and all orders for this visit:  Type II diabetes mellitus, well controlled ([place]) -     POCT HgB A1C  Need for hepatitis B vaccination -     Hepatitis B vaccine adult IM  Cellulitis of left lower extremity -     doxycycline (VIBRA-TABS) 100 MG tablet; Take 1 tablet (100 mg total) by mouth 2 (two) times daily.  Essential hypertension -     COMPLETE METABOLIC PANEL WITH GFR  Chronic venous stasis dermatitis of both lower extremities -     bumetanide (BUMEX) 1 MG tablet; Take 1 tablet (1 mg total) by mouth 2 (two) times daily. -     COMPLETE METABOLIC PANEL WITH GFR  Pure hypercholesterolemia -     Lipid Panel w/reflex Direct LDL  No energy -     T4, free -     T3 Uptake -     TSH  Vitamin D deficiency -     Vitamin D 1,25 dihydroxy  Medication management -     B12  Anxiety -     ALPRAZolam (XANAX) 1 MG tablet; Take 1 tablet (1 mg total) by mouth 2 (two) times daily as needed. for anxiety  Chronic bronchitis, unspecified chronic bronchitis type (HCC) -     albuterol (PROVENTIL HFA;VENTOLIN HFA) 108 (90 Base) MCG/ACT inhaler; Inhale 1-2 puffs into the lungs every 6 (six) hours as needed for wheezing or shortness of breath.  Primary osteoarthritis involving multiple joints -     predniSONE (DELTASONE) 10 MG tablet; Take 3 tablets daily for 5 days.  .. Lab Results  Component Value Date   HGBA1C 6.0 11/23/2017   A!C is well controlled but up from last visit.  Continue on current treatment plan.  BP well controlled. On ARB. On ASA. On  lipitor.  Keep an eye on toe sore. Keep dry and clean. Do not wear shoes that rub. Follow up as needed.   Treated cellulitis on leg with doxycycline. Discussed residual swelling. Gave her bumex to replace lasix as needed. Keep legs elevated. HO given.   Time for her 3 month requested labs.   Discussed with Dr. Georgina Snell concerning her primary Osteoarthritis and treatment plan. Patient would like prn prednisone for 5 days burst when her knee pain worsens. Dr. Georgina Snell agrees with plan. We should try to keep burst as far apart as we can. Discussed risk of prednisone and long term use with  patient such as DVT, increase in blood sugar, osteoporosis, increase in infections.   Per pt she needs referral to redo colonoscopy because 1 day prep was not enough. Would like referral for 2 day prep to Dr. Christell Faith at [facility].  Will get CT of abdomen to look for hernia.   Hypotensive-pt has not drank or ate today. STRONGLY encouraged to do so. If BP not coming up consider decreasing medication for BP. Pt will report BP readings.  Follow up in 3 months.

## 2017-11-23 NOTE — Patient Instructions (Signed)

## 2017-11-24 MED ORDER — PREDNISONE 10 MG PO TABS: ORAL_TABLET | ORAL | 0 refills | Status: DC

## 2017-11-24 NOTE — Progress Notes (Signed)
Call pt: [name] have improved way to go! LDL to goal and looks great.  Kidney function has improved.  B12 perfect.  Thyroid great.  Calcium and albumin just a tad low. Make sure you are getting protein and dairy(or supplement in your diet).   Let her know I talked to Dr. Georgina Snell and he thinks it is reasonable as long as patient knows risk and does not frequently need a burst of prednisone for knees. I will send over burst today.

## 2017-11-25 ENCOUNTER — Telehealth: Payer: Self-pay

## 2017-11-25 ENCOUNTER — Encounter: Payer: Self-pay | Admitting: Physician Assistant

## 2017-11-25 ENCOUNTER — Telehealth: Payer: Self-pay | Admitting: Physician Assistant

## 2017-11-25 DIAGNOSIS — Z79899 Other long term (current) drug therapy: Secondary | ICD-10-CM | POA: Diagnosis not present

## 2017-11-25 DIAGNOSIS — N183 Chronic kidney disease, stage 3 (moderate): Secondary | ICD-10-CM | POA: Diagnosis not present

## 2017-11-25 DIAGNOSIS — M8949 Other hypertrophic osteoarthropathy, multiple sites: Secondary | ICD-10-CM | POA: Insufficient documentation

## 2017-11-25 DIAGNOSIS — L03116 Cellulitis of left lower limb: Secondary | ICD-10-CM | POA: Insufficient documentation

## 2017-11-25 DIAGNOSIS — M150 Primary generalized (osteo)arthritis: Secondary | ICD-10-CM | POA: Insufficient documentation

## 2017-11-25 DIAGNOSIS — R1031 Right lower quadrant pain: Secondary | ICD-10-CM

## 2017-11-25 DIAGNOSIS — R0609 Other forms of dyspnea: Secondary | ICD-10-CM | POA: Diagnosis not present

## 2017-11-25 DIAGNOSIS — R140 Abdominal distension (gaseous): Secondary | ICD-10-CM | POA: Insufficient documentation

## 2017-11-25 DIAGNOSIS — I129 Hypertensive chronic kidney disease with stage 1 through stage 4 chronic kidney disease, or unspecified chronic kidney disease: Secondary | ICD-10-CM | POA: Diagnosis not present

## 2017-11-25 DIAGNOSIS — R0789 Other chest pain: Secondary | ICD-10-CM | POA: Diagnosis not present

## 2017-11-25 DIAGNOSIS — G4733 Obstructive sleep apnea (adult) (pediatric): Secondary | ICD-10-CM | POA: Diagnosis not present

## 2017-11-25 DIAGNOSIS — J45909 Unspecified asthma, uncomplicated: Secondary | ICD-10-CM | POA: Diagnosis not present

## 2017-11-25 DIAGNOSIS — M159 Polyosteoarthritis, unspecified: Secondary | ICD-10-CM | POA: Insufficient documentation

## 2017-11-25 DIAGNOSIS — L03115 Cellulitis of right lower limb: Secondary | ICD-10-CM | POA: Insufficient documentation

## 2017-11-25 DIAGNOSIS — Z7984 Long term (current) use of oral hypoglycemic drugs: Secondary | ICD-10-CM | POA: Diagnosis not present

## 2017-11-25 DIAGNOSIS — E1122 Type 2 diabetes mellitus with diabetic chronic kidney disease: Secondary | ICD-10-CM | POA: Diagnosis not present

## 2017-11-25 DIAGNOSIS — Z6841 Body Mass Index (BMI) 40.0 and over, adult: Secondary | ICD-10-CM | POA: Diagnosis not present

## 2017-11-25 MED ORDER — FLUCONAZOLE 150 MG PO TABS: 150.0000 mg | ORAL_TABLET | Freq: Once | ORAL | 0 refills | Status: AC

## 2017-11-25 NOTE — Telephone Encounter (Signed)
Sent over prescription for and for yeast infections.  In regards to the headache is she drinking enough water.  I noticed in the office visit note with Jade 2 days ago that she was not drinking enough fluid.  In fact she might even need to skip her Bumex for a day or 2.  This could definitely contribute to a headache while taking antibiotics if she is not well-hydrated.

## 2017-11-25 NOTE — Telephone Encounter (Signed)
Spoke with radiology, order for ct abd/pelvis w contrast is correct. Pt advised of order.

## 2017-11-25 NOTE — Telephone Encounter (Signed)
[name] called and left a message stating she needs a change in her antibiotic. She reports the doxycycline is giving her headaches. Please advise.

## 2017-11-25 NOTE — Telephone Encounter (Signed)
Hep B injection in chart now.

## 2017-11-25 NOTE — Telephone Encounter (Signed)
Pt states the doxycyline is giving her headaches. She also is requesting diflucan for yeast infections, she gets them with any antibiotics.

## 2017-11-25 NOTE — Telephone Encounter (Signed)
I cannot close chart due to immunization pending.

## 2017-11-25 NOTE — Telephone Encounter (Signed)
Pt is having abdominal distention, pressure in RLQ, fullness in her abdomen and she is requesting a CT to rule out hernia.  Need CT abd/pelvis with contrast. It says medicare sill not pay for it with associated dx. Can you look into this or at least make pt aware of insurance decision Thanks,

## 2017-11-26 LAB — COMPLETE METABOLIC PANEL WITH GFR
AG Ratio: 1.3 (calc) (ref 1.0–2.5)
ALT: 7 U/L (ref 6–29)
AST: 11 U/L (ref 10–35)
Albumin: 3.3 g/dL — ABNORMAL LOW (ref 3.6–5.1)
Alkaline phosphatase (APISO): 73 U/L (ref 33–130)
BUN/Creatinine Ratio: 16 (calc) (ref 6–22)
BUN: 19 mg/dL (ref 7–25)
CO2: 21 mmol/L (ref 20–32)
Calcium: 8.5 mg/dL — ABNORMAL LOW (ref 8.6–10.4)
Chloride: 107 mmol/L (ref 98–110)
Creat: 1.18 mg/dL — ABNORMAL HIGH (ref 0.50–0.99)
GFR, Est African American: 56 mL/min/{1.73_m2} — ABNORMAL LOW (ref >=60)
GFR, Est Non African American: 48 mL/min/{1.73_m2} — ABNORMAL LOW (ref >=60)
Globulin: 2.6 g/dL (calc) (ref 1.9–3.7)
Glucose, Bld: 107 mg/dL — ABNORMAL HIGH (ref 65–99)
Potassium: 4.3 mmol/L (ref 3.5–5.3)
Sodium: 138 mmol/L (ref 135–146)
Total Bilirubin: 0.4 mg/dL (ref 0.2–1.2)
Total Protein: 5.9 g/dL — ABNORMAL LOW (ref 6.1–8.1)

## 2017-11-26 LAB — LIPID PANEL W/REFLEX DIRECT LDL
Cholesterol: 132 mg/dL (ref <200)
HDL: 47 mg/dL — ABNORMAL LOW (ref >50)
LDL Cholesterol (Calc): 62 mg/dL (calc)
Non-HDL Cholesterol (Calc): 85 mg/dL (calc) (ref <130)
Total CHOL/HDL Ratio: 2.8 (calc) (ref <5.0)
Triglycerides: 157 mg/dL — ABNORMAL HIGH (ref <150)

## 2017-11-26 LAB — T4, FREE: Free T4: 1 ng/dL (ref 0.8–1.8)

## 2017-11-26 LAB — VITAMIN D 1,25 DIHYDROXY
Vitamin D 1, 25 (OH)2 Total: 20 pg/mL (ref 18–72)
Vitamin D2 1, 25 (OH)2: 20 pg/mL
Vitamin D3 1, 25 (OH)2: <8 pg/mL

## 2017-11-26 LAB — T3 UPTAKE: T3 Uptake: 31 % (ref 22–35)

## 2017-11-26 LAB — VITAMIN B12: Vitamin B-12: 785 pg/mL (ref 200–1100)

## 2017-11-26 LAB — TSH: TSH: 1.8 mIU/L (ref 0.40–4.50)

## 2017-11-28 NOTE — Telephone Encounter (Signed)
LEFT DETAILED MESSAGE ON PATIENT VM WITH INFORMATION AS NOTED BELOW. Makaia Rappa,CMA

## 2017-11-30 ENCOUNTER — Ambulatory Visit (HOSPITAL_BASED_OUTPATIENT_CLINIC_OR_DEPARTMENT_OTHER): Admission: RE | Admit: 2017-11-30 | Payer: Medicare Other | Source: Ambulatory Visit

## 2017-12-01 ENCOUNTER — Other Ambulatory Visit: Payer: Self-pay | Admitting: Physician Assistant

## 2017-12-01 ENCOUNTER — Ambulatory Visit (HOSPITAL_BASED_OUTPATIENT_CLINIC_OR_DEPARTMENT_OTHER)
Admission: RE | Admit: 2017-12-01 | Discharge: 2017-12-01 | Disposition: A | Discharge status: Home / Self Care | Payer: Medicare Other | Source: Ambulatory Visit | Attending: Physician Assistant | Admitting: Physician Assistant

## 2017-12-01 ENCOUNTER — Encounter (HOSPITAL_BASED_OUTPATIENT_CLINIC_OR_DEPARTMENT_OTHER): Payer: Self-pay

## 2017-12-01 ENCOUNTER — Telehealth: Payer: Self-pay

## 2017-12-01 DIAGNOSIS — M25511 Pain in right shoulder: Principal | ICD-10-CM

## 2017-12-01 DIAGNOSIS — Z1231 Encounter for screening mammogram for malignant neoplasm of breast: Secondary | ICD-10-CM

## 2017-12-01 DIAGNOSIS — R140 Abdominal distension (gaseous): Secondary | ICD-10-CM

## 2017-12-01 DIAGNOSIS — G8929 Other chronic pain: Secondary | ICD-10-CM

## 2017-12-01 DIAGNOSIS — R109 Unspecified abdominal pain: Secondary | ICD-10-CM | POA: Diagnosis not present

## 2017-12-01 DIAGNOSIS — R1031 Right lower quadrant pain: Secondary | ICD-10-CM | POA: Diagnosis not present

## 2017-12-01 DIAGNOSIS — K766 Portal hypertension: Secondary | ICD-10-CM | POA: Insufficient documentation

## 2017-12-01 MED ORDER — IOPAMIDOL (ISOVUE-300) INJECTION 61%
100.0000 mL | Freq: Once | INTRAVENOUS | Status: AC | PRN
  Administered 2017-12-01: 100 mL via INTRAVENOUS

## 2017-12-01 MED ORDER — HYDROCODONE-ACETAMINOPHEN 5-325 MG PO TABS: 1.0000 | ORAL_TABLET | Freq: Three times a day (TID) | ORAL | 0 refills | Status: DC | PRN

## 2017-12-01 NOTE — Telephone Encounter (Signed)
Sent.  Thank you.

## 2017-12-01 NOTE — Telephone Encounter (Signed)
[name] I can send from home if want to take out of methene[facility] box. Let me know. Her last refill was 11/8 from [facility] controlled substance database.

## 2017-12-01 NOTE — Telephone Encounter (Signed)
Sorry [name] I didn't think you were set up. I will get the Rx back so you can send to [facility] on [address].

## 2017-12-01 NOTE — Telephone Encounter (Signed)
Patient called and wants a refill of the Hydrocodone. She wants Jade to know she hit her knee last night and that today she had a mammogram.   Rx printed and in Met[facility]'s basket.

## 2017-12-02 ENCOUNTER — Encounter: Payer: Self-pay | Admitting: *Deleted

## 2017-12-02 NOTE — Progress Notes (Signed)
Call pt: normal mammogram. Follow up in 1 year.

## 2017-12-13 ENCOUNTER — Ambulatory Visit: Payer: Medicare Other | Admitting: Family Medicine

## 2017-12-14 ENCOUNTER — Encounter: Payer: Self-pay | Admitting: Family Medicine

## 2017-12-14 ENCOUNTER — Ambulatory Visit (INDEPENDENT_AMBULATORY_CARE_PROVIDER_SITE_OTHER): Payer: Medicare Other | Admitting: Family Medicine

## 2017-12-14 VITALS — BP 141/74 | HR 105 | Wt 300.0 lb

## 2017-12-14 DIAGNOSIS — L03032 Cellulitis of left toe: Secondary | ICD-10-CM | POA: Diagnosis not present

## 2017-12-14 DIAGNOSIS — I872 Venous insufficiency (chronic) (peripheral): Secondary | ICD-10-CM

## 2017-12-14 DIAGNOSIS — M2042 Other hammer toe(s) (acquired), left foot: Secondary | ICD-10-CM | POA: Diagnosis not present

## 2017-12-14 MED ORDER — CEFDINIR 300 MG PO CAPS: 300.0000 mg | ORAL_CAPSULE | Freq: Two times a day (BID) | ORAL | 0 refills | Status: DC

## 2017-12-14 MED ORDER — TERBINAFINE HCL 1 % EX CREA: TOPICAL_CREAM | CUTANEOUS | 3 refills | Status: DC

## 2017-12-14 MED ORDER — PREDNISONE 10 MG PO TABS: 30.0000 mg | ORAL_TABLET | Freq: Every day | ORAL | 0 refills | Status: DC

## 2017-12-14 NOTE — Patient Instructions (Signed)
Thank you for coming in today. Take omnicef twice daily.  Use terbinafine cream twice daily.  Return for a nurse visit for [employer] this week or next week.  Return for a follow up visit with me 1 week after the [employer] goes on.    Chronic Venous Insufficiency Chronic venous insufficiency, also called venous stasis, is a condition that prevents blood from being pumped effectively through the veins in your legs. Blood may no longer be pumped effectively from the legs back to the heart. This condition can range from mild to severe. With proper treatment, you should be able to continue with an active life. What are the causes? Chronic venous insufficiency occurs when the vein walls become stretched, weakened, or damaged, or when valves within the vein are damaged. Some common causes of this include:  High blood pressure inside the veins (venous hypertension).  Increased blood pressure in the leg veins from long periods of sitting or standing.  A blood clot that blocks blood flow in a vein (deep vein thrombosis, DVT).  Inflammation of a vein (phlebitis) that causes a blood clot to form.  Tumors in the pelvis that cause blood to back up.  What increases the risk? The following factors may make you more likely to develop this condition:  Having a family history of this condition.  Obesity.  Pregnancy.  Living without enough physical activity or exercise (sedentary lifestyle).  Smoking.  Having a job that requires long periods of standing or sitting in one place.  Being a certain age. Women in their 91s and 16s and men in their 60s are more likely to develop this condition.  What are the signs or symptoms? Symptoms of this condition include:  Veins that are enlarged, bulging, or twisted (varicose veins).  Skin breakdown or ulcers.  Reddened or discolored skin on the front of the leg.  Brown, smooth, tight, and painful skin just above the ankle, usually on the inside of the  leg (lipodermatosclerosis).  Swelling.  How is this diagnosed? This condition may be diagnosed based on:  Your medical history.  A physical exam.  Tests, such as: ? A procedure that creates an image of a blood vessel and nearby organs and provides information about blood flow through the blood vessel (duplex ultrasound). ? A procedure that tests blood flow (plethysmography). ? A procedure to look at the veins using X-ray and dye (venogram).  How is this treated? The goals of treatment are to help you return to an active life and to minimize pain or disability. Treatment depends on the severity of your condition, and it may include:  Wearing compression stockings. These can help relieve symptoms and help prevent your condition from getting worse. However, they do not cure the condition.  Sclerotherapy. This is a procedure involving an injection of a material that "dissolves" damaged veins.  Surgery. This may involve: ? Removing a diseased vein (vein stripping). ? Cutting off blood flow through the vein (laser ablation surgery). ? Repairing a valve.  Follow these instructions at home:  Wear compression stockings as told by your health care provider. These stockings help to prevent blood clots and reduce swelling in your legs.  Take over-the-counter and prescription medicines only as told by your health care provider.  Stay active by exercising, walking, or doing different activities. Ask your health care provider what activities are safe for you and how much exercise you need.  Drink enough fluid to keep your urine clear or pale  yellow.  Do not use any products that contain nicotine or tobacco, such as cigarettes and e-cigarettes. If you need help quitting, ask your health care provider.  Keep all follow-up visits as told by your health care provider. This is important. Contact a health care provider if:  You have redness, swelling, or more pain in the affected area.  You  see a red streak or line that extends up or down from the affected area.  You have skin breakdown or a loss of skin in the affected area, even if the breakdown is small.  You get an injury in the affected area. Get help right away if:  You get an injury and an open wound in the affected area.  You have severe pain that does not get better with medicine.  You have sudden numbness or weakness in the foot or ankle below the affected area, or you have trouble moving your foot or ankle.  You have a fever and you have worse or persistent symptoms.  You have chest pain.  You have shortness of breath. Summary  Chronic venous insufficiency, also called venous stasis, is a condition that prevents blood from being pumped effectively through the veins in your legs.  Chronic venous insufficiency occurs when the vein walls become stretched, weakened, or damaged, or when valves within the vein are damaged.  Treatment for this condition depends on how severe your condition is, and it may involve wearing compression stockings or having a procedure.  Make sure you stay active by exercising, walking, or doing different activities. Ask your health care provider what activities are safe for you and how much exercise you need. This information is not intended to replace advice given to you by your health care provider. Make sure you discuss any questions you have with your health care provider. Document Released: 04/18/2007 Document Revised: 11/01/2016 Document Reviewed: 11/01/2016 Elsevier Interactive Patient Education  2017 [employer].

## 2017-12-15 DIAGNOSIS — Z7984 Long term (current) use of oral hypoglycemic drugs: Secondary | ICD-10-CM | POA: Diagnosis not present

## 2017-12-15 DIAGNOSIS — Z888 Allergy status to other drugs, medicaments and biological substances status: Secondary | ICD-10-CM | POA: Diagnosis not present

## 2017-12-15 DIAGNOSIS — R0609 Other forms of dyspnea: Secondary | ICD-10-CM | POA: Diagnosis not present

## 2017-12-15 DIAGNOSIS — E785 Hyperlipidemia, unspecified: Secondary | ICD-10-CM | POA: Diagnosis not present

## 2017-12-15 DIAGNOSIS — Z6841 Body Mass Index (BMI) 40.0 and over, adult: Secondary | ICD-10-CM | POA: Diagnosis not present

## 2017-12-15 DIAGNOSIS — E8770 Fluid overload, unspecified: Secondary | ICD-10-CM | POA: Diagnosis not present

## 2017-12-15 DIAGNOSIS — Z79899 Other long term (current) drug therapy: Secondary | ICD-10-CM | POA: Diagnosis not present

## 2017-12-15 DIAGNOSIS — Z882 Allergy status to sulfonamides status: Secondary | ICD-10-CM | POA: Diagnosis not present

## 2017-12-15 DIAGNOSIS — K7581 Nonalcoholic steatohepatitis (NASH): Secondary | ICD-10-CM | POA: Diagnosis not present

## 2017-12-15 DIAGNOSIS — J449 Chronic obstructive pulmonary disease, unspecified: Secondary | ICD-10-CM | POA: Diagnosis not present

## 2017-12-15 DIAGNOSIS — K7460 Unspecified cirrhosis of liver: Secondary | ICD-10-CM | POA: Diagnosis not present

## 2017-12-15 DIAGNOSIS — Z86718 Personal history of other venous thrombosis and embolism: Secondary | ICD-10-CM | POA: Diagnosis not present

## 2017-12-15 DIAGNOSIS — E119 Type 2 diabetes mellitus without complications: Secondary | ICD-10-CM | POA: Diagnosis not present

## 2017-12-15 DIAGNOSIS — I10 Essential (primary) hypertension: Secondary | ICD-10-CM | POA: Diagnosis not present

## 2017-12-15 NOTE — Progress Notes (Signed)
[name] is a 66 y.o. female who presents to [facility] today for left leg swelling left second toe pain and rash on left forearm.  Left leg swelling: [name] is redness and swelling and mild pain of the left lower leg.  She notes considerable left knee DJD and notes that her leg tends to swell at times.  She notes that her skin on her lower left leg is red and itchy.  She denies any fevers or chills.  Left toe pain: [name] has an irritated area at the dorsal aspect of her left toe at the PIP.  She states this rubs against the top of her shoe and is somewhat irritated and painful now.  It has worsened over the past week.  Left forearm rash: And notes some irritated area on her left dorsal forearm.  This is nontender.  She notes some bruising around this area because she has been scratching as well.   Past Medical History:  Diagnosis Date  . Anemia    hx of low iron  . Anxiety   . Arthritis   . Cataracts, bilateral   . Chronic kidney disease    nephrologist  Dr. Gillian Shields [facility], told pt. she is stage III  . Diabetes ([place])   . Family history of coronary artery disease   . Fatty liver   . GERD (gastroesophageal reflux disease)   . Headache   . Morbid obesity ([place])   . Peripheral vascular disease ([place])   . PONV (postoperative nausea and vomiting)   . Pulmonary embolism ([place]) 2001  . Restless leg syndrome   . Shortness of breath dyspnea   . Sleep apnea    can't wear her cpap due to nose being sore  . Venous stasis dermatitis of both lower extremities    Past Surgical History:  Procedure Laterality Date  . ABDOMINAL HYSTERECTOMY    . APPENDECTOMY    . BUNIONECTOMY Right 01/28/2015   Procedure: Lillard Anes;  Surgeon: Hessie Dibble, MD;  Location: [facility];  Service: Orthopedics;  Laterality: Right;  . CARDIAC CATHETERIZATION  2014  .  CHOLECYSTECTOMY    . COLONOSCOPY    . COLONOSCOPY    . FRACTURE SURGERY Left    shoulder  . HERNIA REPAIR     umbilical hernia  . JOINT REPLACEMENT Right 2001   knee  . PANNICULECTOMY    . SHOULDER ARTHROSCOPY Right 02/24/2016   Procedure: RIGHT SHOULDER ARTHROSCOPY, ACROMIOPLASTY, DISTAL CLAVICLE RESECTION, DEBRIDEMENT;  Surgeon: Melrose Nakayama, MD;  Location: [facility];  Service: Orthopedics;  Laterality: Right;  . TENNIS ELBOW RELEASE/NIRSCHEL PROCEDURE Right 02/24/2016   Procedure: RIGHT ELBOW LATERAL REPAIR;  Surgeon: Melrose Nakayama, MD;  Location: [facility];  Service: Orthopedics;  Laterality: Right;   Social History   Tobacco Use  . Smoking status: Never Smoker  . Smokeless tobacco: Never Used  Substance Use Topics  . Alcohol use: No   family history includes Cancer in her maternal grandfather and maternal grandmother; Crohn's disease in her sister; Diabetes in her mother; Heart attack in her father and mother; Heart failure in her sister; Hyperlipidemia in her father and mother; Hypertension in her father, mother, and sister; Parkinsonism in her brother; Renal Disease in her sister; Stroke in her paternal grandfather; Thyroid disease in her sister.  ROS as above:  Medications: Current Outpatient Medications  Medication Sig Dispense Refill  . acetaminophen (TYLENOL) 500 MG tablet  Take 1,000 mg by mouth.    [name] Kitchen albuterol (PROVENTIL HFA;VENTOLIN HFA) 108 (90 Base) MCG/ACT inhaler Inhale 1-2 puffs into the lungs every 6 (six) hours as needed for wheezing or shortness of breath. 1 Inhaler 5  . ALPRAZolam (XANAX) 1 MG tablet Take 1 tablet (1 mg total) by mouth 2 (two) times daily as needed. for anxiety 60 tablet 2  . aspirin 81 MG tablet Take 81 mg by mouth daily.    [name] Kitchen atorvastatin (LIPITOR) 40 MG tablet Take 1 tablet (40 mg total) by mouth daily. 90 tablet 1  . budesonide-formoterol (SYMBICORT) 160-4.5 MCG/ACT inhaler Inhale 2 puffs into the lungs 2 (two) times daily. 3 Inhaler 3  .  bumetanide (BUMEX) 1 MG tablet Take 1 tablet (1 mg total) by mouth 2 (two) times daily. 60 tablet 1  . cholecalciferol (VITAMIN D) 1000 units tablet Take 1,000 Units by mouth daily.    . citalopram (CELEXA) 40 MG tablet Take 1 tablet (40 mg total) by mouth daily. 90 tablet 1  . co-enzyme Q-10 50 MG capsule Take 50 mg by mouth daily.    . colchicine 0.6 MG tablet Take 2 tablets once and then 1 tablet 1 hour later repeat no more than 3 days. For gout outbreak. 30 tablet 0  . doxycycline (VIBRA-TABS) 100 MG tablet Take 1 tablet (100 mg total) by mouth 2 (two) times daily. 20 tablet 0  . DULoxetine (CYMBALTA) 60 MG capsule 2 po daily 180 capsule 1  . furosemide (LASIX) 40 MG tablet Take 1 tablet (40 mg total) by mouth daily. 90 tablet 1  . glipiZIDE (GLUCOTROL) 5 MG tablet TAKE ONE TABLET BY MOUTH ONCE DAILY BEFORE BREAKFAST 90 tablet 1  . HYDROcodone-acetaminophen (NORCO/VICODIN) 5-325 MG tablet Take 1 tablet by mouth every 8 (eight) hours as needed for moderate pain. 45 tablet 0  . losartan (COZAAR) 25 MG tablet Take 1 tablet (25 mg total) by mouth daily. 90 tablet 1  . metFORMIN (GLUCOPHAGE) 1000 MG tablet Take 1 tablet (1,000 mg total) by mouth 2 (two) times daily with a meal. 180 tablet 2  . Multiple Vitamins-Minerals (OCUVITE PO) Take 1 tablet by mouth daily.    . mupirocin ointment (BACTROBAN) 2 % Place 1 application into the nose 3 (three) times daily.    . potassium chloride SA (K-DUR,KLOR-CON) 20 MEQ tablet Take 0.5 tablets (10 mEq total) by mouth daily. 45 tablet 1  . spironolactone (ALDACTONE) 25 MG tablet Take 1 tablet (25 mg total) by mouth daily. 90 tablet 1  . topiramate (TOPAMAX) 50 MG tablet Take 4 tablets (200 mg total) by mouth at bedtime. 360 tablet 1  . Vitamin D, Ergocalciferol, (DRISDOL) 50000 units CAPS capsule TAKE 1 CAPSULE BY MOUTH ONCE A WEEK 4 capsule 2  . cefdinir (OMNICEF) 300 MG capsule Take 1 capsule (300 mg total) by mouth 2 (two) times daily. 14 capsule 0  .  predniSONE (DELTASONE) 10 MG tablet Take 3 tablets (30 mg total) by mouth daily with breakfast. 15 tablet 0  . terbinafine (LAMISIL) 1 % cream Apply to affected area BID until rash gone, then apply 2 more days. 30 g 3   No current facility-administered medications for this visit.    Allergies  Allergen Reactions  . Amlodipine Itching and Rash  . Lovastatin Itching  . Sulfacetamide Sodium Itching  . Sulfamethoxazole Itching  . Sulfa Antibiotics Itching    Health Maintenance Health Maintenance  Topic Date Due  . FOOT EXAM  06/13/1962  .  OPHTHALMOLOGY EXAM  06/13/1962  . HIV Screening  06/14/1967  . COLONOSCOPY  06/13/2002  . Hepatitis C Screening  02/06/2027 (Originally 14-Jun-1951)  . HEMOGLOBIN A1C  05/23/2018  . PNA vac Low Risk Adult (2 of 2 - PPSV23) 09/07/2019  . MAMMOGRAM  12/02/2019  . TETANUS/TDAP  12/27/2025  . INFLUENZA VACCINE  Completed  . DEXA SCAN  Completed  . PAP SMEAR  Discontinued     Exam:  BP (!) 141/74   Pulse (!) 105   Wt 300 lb (136.1 kg)   BMI 58.59 kg/m  Gen: Well NAD orbitally obese HEENT: EOMI,  MMM Lungs: Normal work of breathing. CTABL Heart: RRR no MRG Abd: NABS, Soft. Nondistended, Nontender Exts: Brisk capillary refill, warm and well perfused.  Left lower leg erythematous with trace edema.  Some excoriation present.  Classic appearance for venous stasis dermatitis. Left foot irritated nodule at the dorsal aspect of the PIP second toe.  The surrounding skin is erythematous and tender. Left forearm.  Small irritated papule approximately 2 or 3 mm across at the dorsal forearm with surrounding ecchymosis..       No results found for this or any previous visit (from the past 72 hour(s)). No results found.    Assessment and Plan: 66 y.o. female with  Leg swelling: Venous stasis dermatitis.  I offered Unna boot. [name] to wait till Friday for [employer].  She will return to see me or have a nurse visit.  She will recheck with me in about a  week Unna boot is applied.  [place] with irritation and cellulitis: Left second toe.  I made a small cut in the shoe overlying the area that has been rubbed.  This should decrease the pressure to this area.  Additionally will treat empirically with Omnicef antibiotics for cellulitis.  Left forearm irritation: Unclear etiology.  Plan for trial of terbinafine cream.  If not better will proceed with an excisional biopsy.   No orders of the defined types were placed in this encounter.  Meds ordered this encounter  Medications  . cefdinir (OMNICEF) 300 MG capsule    Sig: Take 1 capsule (300 mg total) by mouth 2 (two) times daily.    Dispense:  14 capsule    Refill:  0  . predniSONE (DELTASONE) 10 MG tablet    Sig: Take 3 tablets (30 mg total) by mouth daily with breakfast.    Dispense:  15 tablet    Refill:  0  . terbinafine (LAMISIL) 1 % cream    Sig: Apply to affected area BID until rash gone, then apply 2 more days.    Dispense:  30 g    Refill:  3     Discussed warning signs or symptoms. Please see discharge instructions. Patient expresses understanding.

## 2018-01-03 ENCOUNTER — Other Ambulatory Visit: Payer: Self-pay | Admitting: Physician Assistant

## 2018-01-03 DIAGNOSIS — G8929 Other chronic pain: Secondary | ICD-10-CM

## 2018-01-03 DIAGNOSIS — M25511 Pain in right shoulder: Principal | ICD-10-CM

## 2018-01-03 MED ORDER — HYDROCODONE-ACETAMINOPHEN 5-325 MG PO TABS: 1.0000 | ORAL_TABLET | Freq: Three times a day (TID) | ORAL | 0 refills | Status: DC | PRN

## 2018-01-03 NOTE — Telephone Encounter (Signed)
Pt advised of approval  

## 2018-01-12 ENCOUNTER — Encounter: Payer: Self-pay | Admitting: Physician Assistant

## 2018-01-19 ENCOUNTER — Telehealth: Payer: Self-pay | Admitting: Physician Assistant

## 2018-01-19 ENCOUNTER — Other Ambulatory Visit: Payer: Self-pay | Admitting: Family Medicine

## 2018-01-19 MED ORDER — PREDNISONE 50 MG PO TABS: ORAL_TABLET | ORAL | 0 refills | Status: DC

## 2018-01-19 NOTE — Telephone Encounter (Signed)
Looking at the picture of her legs from the most recent office note it looks to be more from venous stasis dermatitis.  I will send in a 50 mg 5-day burst of prednisone but she needs to come in if this does not completely knock it out.  Ultimately this is going to need aggressive compression stockings.

## 2018-01-19 NOTE — Telephone Encounter (Signed)
Pt called clinic requesting and Rx for prednisone. Pt states her left foot swells up "all the time" and now its so bad she cannot walk. Pt states she cannot come into clinic for an appointment because she wouldn't be able to get here. Will route to Provider in clinic today, and to PCP for review.

## 2018-01-20 NOTE — Telephone Encounter (Signed)
Pt picked up Rx last night and is reporting she already is feeling better. She is going to schedule anyway next week for follow up. No further questions.

## 2018-02-01 ENCOUNTER — Other Ambulatory Visit: Payer: Self-pay

## 2018-02-01 MED ORDER — VITAMIN D (ERGOCALCIFEROL) 1.25 MG (50000 UNIT) PO CAPS: 50000.0000 [IU] | ORAL_CAPSULE | ORAL | 1 refills | Status: DC

## 2018-02-01 NOTE — Telephone Encounter (Signed)
[name] called for a refill on Vitamin D. Did you want her to take this medication long term?

## 2018-02-07 DIAGNOSIS — E1140 Type 2 diabetes mellitus with diabetic neuropathy, unspecified: Secondary | ICD-10-CM | POA: Diagnosis not present

## 2018-02-07 DIAGNOSIS — D631 Anemia in chronic kidney disease: Secondary | ICD-10-CM | POA: Diagnosis not present

## 2018-02-07 DIAGNOSIS — E1122 Type 2 diabetes mellitus with diabetic chronic kidney disease: Secondary | ICD-10-CM | POA: Diagnosis not present

## 2018-02-07 DIAGNOSIS — J449 Chronic obstructive pulmonary disease, unspecified: Secondary | ICD-10-CM | POA: Diagnosis not present

## 2018-02-07 DIAGNOSIS — N183 Chronic kidney disease, stage 3 (moderate): Secondary | ICD-10-CM | POA: Diagnosis not present

## 2018-02-07 DIAGNOSIS — D500 Iron deficiency anemia secondary to blood loss (chronic): Secondary | ICD-10-CM | POA: Diagnosis not present

## 2018-02-07 DIAGNOSIS — G4733 Obstructive sleep apnea (adult) (pediatric): Secondary | ICD-10-CM | POA: Diagnosis not present

## 2018-02-07 DIAGNOSIS — I10 Essential (primary) hypertension: Secondary | ICD-10-CM | POA: Diagnosis not present

## 2018-02-07 DIAGNOSIS — E559 Vitamin D deficiency, unspecified: Secondary | ICD-10-CM | POA: Diagnosis not present

## 2018-02-07 DIAGNOSIS — D510 Vitamin B12 deficiency anemia due to intrinsic factor deficiency: Secondary | ICD-10-CM | POA: Diagnosis not present

## 2018-02-07 DIAGNOSIS — Z6841 Body Mass Index (BMI) 40.0 and over, adult: Secondary | ICD-10-CM | POA: Diagnosis not present

## 2018-02-07 DIAGNOSIS — K760 Fatty (change of) liver, not elsewhere classified: Secondary | ICD-10-CM | POA: Diagnosis not present

## 2018-02-07 DIAGNOSIS — E538 Deficiency of other specified B group vitamins: Secondary | ICD-10-CM | POA: Diagnosis not present

## 2018-02-07 DIAGNOSIS — I129 Hypertensive chronic kidney disease with stage 1 through stage 4 chronic kidney disease, or unspecified chronic kidney disease: Secondary | ICD-10-CM | POA: Diagnosis not present

## 2018-02-07 DIAGNOSIS — K219 Gastro-esophageal reflux disease without esophagitis: Secondary | ICD-10-CM | POA: Diagnosis not present

## 2018-02-09 ENCOUNTER — Encounter: Payer: Self-pay | Admitting: Family Medicine

## 2018-02-09 ENCOUNTER — Other Ambulatory Visit: Payer: Self-pay | Admitting: Physician Assistant

## 2018-02-09 ENCOUNTER — Ambulatory Visit (INDEPENDENT_AMBULATORY_CARE_PROVIDER_SITE_OTHER): Payer: Medicare Other | Admitting: Family Medicine

## 2018-02-09 VITALS — BP 127/72 | HR 97

## 2018-02-09 DIAGNOSIS — L03116 Cellulitis of left lower limb: Secondary | ICD-10-CM

## 2018-02-09 DIAGNOSIS — M25511 Pain in right shoulder: Principal | ICD-10-CM

## 2018-02-09 DIAGNOSIS — G8929 Other chronic pain: Secondary | ICD-10-CM

## 2018-02-09 DIAGNOSIS — L739 Follicular disorder, unspecified: Secondary | ICD-10-CM | POA: Diagnosis not present

## 2018-02-09 MED ORDER — CEFDINIR 300 MG PO CAPS: 300.0000 mg | ORAL_CAPSULE | Freq: Two times a day (BID) | ORAL | 0 refills | Status: DC

## 2018-02-09 MED ORDER — HYDROCODONE-ACETAMINOPHEN 5-325 MG PO TABS: 1.0000 | ORAL_TABLET | Freq: Three times a day (TID) | ORAL | 0 refills | Status: DC | PRN

## 2018-02-09 NOTE — Patient Instructions (Signed)
Thank you for coming in today. Recheck as needed.  Take omnicef twice daily for 1 week.  Recheck if not better.    Cellulitis, Adult Cellulitis is a skin infection. The infected area is usually red and sore. This condition occurs most often in the arms and lower legs. It is very important to get treated for this condition. Follow these instructions at home:  Take over-the-counter and prescription medicines only as told by your doctor.  If you were prescribed an antibiotic medicine, take it as told by your doctor. Do not stop taking the antibiotic even if you start to feel better.  Drink enough fluid to keep your pee (urine) clear or pale yellow.  Do not touch or rub the infected area.  Raise (elevate) the infected area above the level of your heart while you are sitting or lying down.  Place warm or cold wet cloths (warm or cold compresses) on the infected area. Do this as told by your doctor.  Keep all follow-up visits as told by your doctor. This is important. These visits let your doctor make sure your infection is not getting worse. Contact a doctor if:  You have a fever.  Your symptoms do not get better after 1-2 days of treatment.  Your bone or joint under the infected area starts to hurt after the skin has healed.  Your infection comes back. This can happen in the same area or another area.  You have a swollen bump in the infected area.  You have new symptoms.  You feel ill and also have muscle aches and pains. Get help right away if:  Your symptoms get worse.  You feel very sleepy.  You throw up (vomit) or have watery poop (diarrhea) for a long time.  There are red streaks coming from the infected area.  Your red area gets larger.  Your red area turns darker. This information is not intended to replace advice given to you by your health care provider. Make sure you discuss any questions you have with your health care provider. Document Released: 05/31/2008  Document Revised: 05/20/2016 Document Reviewed: 10/22/2015 Elsevier Interactive Patient Education  2018 [employer] Inc[employer]   Folliculitis Folliculitis is inflammation of the hair follicles. Folliculitis most commonly occurs on the scalp, thighs, legs, back, and buttocks. However, it can occur anywhere on the body. What are the causes? This condition may be caused by:  A bacterial infection (common).  A fungal infection.  A viral infection.  Coming into contact with certain chemicals, especially oils and tars.  Shaving or waxing.  Applying greasy ointments or creams to your skin often.  Long-lasting folliculitis and folliculitis that keeps coming back can be caused by bacteria that live in the nostrils. What increases the risk? This condition is more likely to develop in people with:  A weakened immune system.  Diabetes.  Obesity.  What are the signs or symptoms? Symptoms of this condition include:  Redness.  Soreness.  Swelling.  Itching.  Small white or yellow, pus-filled, itchy spots (pustules) that appear over a reddened area. If there is an infection that goes deep into the follicle, these may develop into a boil (furuncle).  A group of closely packed boils (carbuncle). These tend to form in hairy, sweaty areas of the body.  How is this diagnosed? This condition is diagnosed with a skin exam. To find what is causing the condition, your health care provider may take a sample of one of the pustules or boils for  testing. How is this treated? This condition may be treated by:  Applying warm compresses to the affected areas.  Taking an antibiotic medicine or applying an antibiotic medicine to the skin.  Applying or bathing with an antiseptic solution.  Taking an over-the-counter medicine to help with itching.  Having a procedure to drain any pustules or boils. This may be done if a pustule or boil contains a lot of pus or fluid.  Laser hair removal. This may be  done to treat long-lasting folliculitis.  Follow these instructions at home:  If directed, apply heat to the affected area as often as told by your health care provider. Use the heat source that your health care provider recommends, such as a moist heat pack or a heating pad. ? Place a towel between your skin and the heat source. ? Leave the heat on for 20-30 minutes. ? Remove the heat if your skin turns bright red. This is especially important if you are unable to feel pain, heat, or cold. You may have a greater risk of getting burned.  If you were prescribed an antibiotic medicine, use it as told by your health care provider. Do not stop using the antibiotic even if you start to feel better.  Take over-the-counter and prescription medicines only as told by your health care provider.  Do not shave irritated skin.  Keep all follow-up visits as told by your health care provider. This is important. Get help right away if:  You have more redness, swelling, or pain in the affected area.  Red streaks are spreading from the affected area.  You have a fever. This information is not intended to replace advice given to you by your health care provider. Make sure you discuss any questions you have with your health care provider. Document Released: 02/21/2002 Document Revised: 07/02/2016 Document Reviewed: 10/03/2015 Elsevier Interactive Patient Education  2018 [employer].

## 2018-02-10 NOTE — Progress Notes (Signed)
[name] is a 67 y.o. female who presents to [facility] today for follow-up on head and rash on left leg.  [name] has a medical history significantly complicated for diabetes morbid obesity and venous stasis dermatitis.   She notes a painful red rash on her left shin present for about a week.  She notes the rash is consistent more with cellulitis which she has had previously.  She does not think the rash is significantly similar to previous episodes of venous stasis dermatitis.  She has not tried much treatment yet.  She denies fevers or chills.  [name] also notes a painful bump on the left lateral head.  Is been present for about 2 weeks and is painful to lay her head on the table.  She denies any discharge or bleeding.  She denies any injury to this area recently.   Past Medical History:  Diagnosis Date  . Anemia    hx of low iron  . Anxiety   . Arthritis   . Cataracts, bilateral   . Chronic kidney disease    nephrologist  Dr. Gillian Shields [facility], told pt. she is stage III  . Diabetes ([place])   . Family history of coronary artery disease   . Fatty liver   . GERD (gastroesophageal reflux disease)   . Headache   . Morbid obesity ([place])   . Peripheral vascular disease ([place])   . PONV (postoperative nausea and vomiting)   . Pulmonary embolism ([place]) 2001  . Restless leg syndrome   . Shortness of breath dyspnea   . Sleep apnea    can't wear her cpap due to nose being sore  . Venous stasis dermatitis of both lower extremities    Past Surgical History:  Procedure Laterality Date  . ABDOMINAL HYSTERECTOMY    . APPENDECTOMY    . BUNIONECTOMY Right 01/28/2015   Procedure: Lillard Anes;  Surgeon: Hessie Dibble, MD;  Location: [facility];  Service: Orthopedics;  Laterality: Right;  . CARDIAC CATHETERIZATION  2014  . CHOLECYSTECTOMY    . COLONOSCOPY    .  COLONOSCOPY    . FRACTURE SURGERY Left    shoulder  . HERNIA REPAIR     umbilical hernia  . JOINT REPLACEMENT Right 2001   knee  . PANNICULECTOMY    . SHOULDER ARTHROSCOPY Right 02/24/2016   Procedure: RIGHT SHOULDER ARTHROSCOPY, ACROMIOPLASTY, DISTAL CLAVICLE RESECTION, DEBRIDEMENT;  Surgeon: Melrose Nakayama, MD;  Location: [facility];  Service: Orthopedics;  Laterality: Right;  . TENNIS ELBOW RELEASE/NIRSCHEL PROCEDURE Right 02/24/2016   Procedure: RIGHT ELBOW LATERAL REPAIR;  Surgeon: Melrose Nakayama, MD;  Location: [facility];  Service: Orthopedics;  Laterality: Right;   Social History   Tobacco Use  . Smoking status: Never Smoker  . Smokeless tobacco: Never Used  Substance Use Topics  . Alcohol use: No   family history includes Cancer in her maternal grandfather and maternal grandmother; Crohn's disease in her sister; Diabetes in her mother; Heart attack in her father and mother; Heart failure in her sister; Hyperlipidemia in her father and mother; Hypertension in her father, mother, and sister; Parkinsonism in her brother; Renal Disease in her sister; Stroke in her paternal grandfather; Thyroid disease in her sister.  ROS as above:  Medications: Current Outpatient Medications  Medication Sig Dispense Refill  . acetaminophen (TYLENOL) 500 MG tablet Take 1,000 mg by mouth.    [name] Kitchen albuterol (PROVENTIL HFA;VENTOLIN HFA) 108 (  90 Base) MCG/ACT inhaler Inhale 1-2 puffs into the lungs every 6 (six) hours as needed for wheezing or shortness of breath. 1 Inhaler 5  . ALPRAZolam (XANAX) 1 MG tablet Take 1 tablet (1 mg total) by mouth 2 (two) times daily as needed. for anxiety 60 tablet 2  . aspirin 81 MG tablet Take 81 mg by mouth daily.    [name] Kitchen atorvastatin (LIPITOR) 40 MG tablet Take 1 tablet (40 mg total) by mouth daily. 90 tablet 1  . budesonide-formoterol (SYMBICORT) 160-4.5 MCG/ACT inhaler Inhale 2 puffs into the lungs 2 (two) times daily. 3 Inhaler 3  . bumetanide (BUMEX) 1 MG tablet Take 1 tablet  (1 mg total) by mouth 2 (two) times daily. 60 tablet 1  . cholecalciferol (VITAMIN D) 1000 units tablet Take 1,000 Units by mouth daily.    . citalopram (CELEXA) 40 MG tablet Take 1 tablet (40 mg total) by mouth daily. 90 tablet 1  . co-enzyme Q-10 50 MG capsule Take 50 mg by mouth daily.    . colchicine 0.6 MG tablet Take 2 tablets once and then 1 tablet 1 hour later repeat no more than 3 days. For gout outbreak. 30 tablet 0  . doxycycline (VIBRA-TABS) 100 MG tablet Take 1 tablet (100 mg total) by mouth 2 (two) times daily. 20 tablet 0  . DULoxetine (CYMBALTA) 60 MG capsule 2 po daily 180 capsule 1  . furosemide (LASIX) 40 MG tablet Take 1 tablet (40 mg total) by mouth daily. 90 tablet 1  . glipiZIDE (GLUCOTROL) 5 MG tablet TAKE ONE TABLET BY MOUTH ONCE DAILY BEFORE BREAKFAST 90 tablet 1  . HYDROcodone-acetaminophen (NORCO/VICODIN) 5-325 MG tablet Take 1 tablet by mouth every 8 (eight) hours as needed for moderate pain. 45 tablet 0  . losartan (COZAAR) 25 MG tablet Take 1 tablet (25 mg total) by mouth daily. 90 tablet 1  . metFORMIN (GLUCOPHAGE) 1000 MG tablet Take 1 tablet (1,000 mg total) by mouth 2 (two) times daily with a meal. 180 tablet 2  . Multiple Vitamins-Minerals (OCUVITE PO) Take 1 tablet by mouth daily.    . mupirocin ointment (BACTROBAN) 2 % Place 1 application into the nose 3 (three) times daily.    . potassium chloride SA (K-DUR,KLOR-CON) 20 MEQ tablet Take 0.5 tablets (10 mEq total) by mouth daily. 45 tablet 1  . predniSONE (DELTASONE) 10 MG tablet Take 3 tablets (30 mg total) by mouth daily with breakfast. 15 tablet 0  . spironolactone (ALDACTONE) 25 MG tablet Take 1 tablet (25 mg total) by mouth daily. 90 tablet 1  . terbinafine (LAMISIL) 1 % cream Apply to affected area BID until rash gone, then apply 2 more days. 30 g 3  . topiramate (TOPAMAX) 50 MG tablet Take 4 tablets (200 mg total) by mouth at bedtime. 360 tablet 1  . Vitamin D, Ergocalciferol, (DRISDOL) 50000 units CAPS  capsule Take 1 capsule (50,000 Units total) by mouth once a week. 12 capsule 1  . cefdinir (OMNICEF) 300 MG capsule Take 1 capsule (300 mg total) by mouth 2 (two) times daily. 14 capsule 0   No current facility-administered medications for this visit.    Allergies  Allergen Reactions  . Amlodipine Itching and Rash  . Lovastatin Itching  . Sulfacetamide Sodium Itching  . Sulfamethoxazole Itching  . Sulfa Antibiotics Itching    Health Maintenance Health Maintenance  Topic Date Due  . FOOT EXAM  06/13/1962  . OPHTHALMOLOGY EXAM  06/13/1962  . HIV Screening  06/14/1967  .  Hepatitis C Screening  02/06/2027 (Originally 1951/08/12)  . HEMOGLOBIN A1C  05/23/2018  . COLONOSCOPY  11/16/2018  . PNA vac Low Risk Adult (2 of 2 - PPSV23) 09/07/2019  . MAMMOGRAM  12/02/2019  . TETANUS/TDAP  12/27/2025  . INFLUENZA VACCINE  Completed  . DEXA SCAN  Completed  . PAP SMEAR  Discontinued     Exam:  BP 127/72   Pulse 97  Gen: Well NAD morbidly obese HEENT: EOMI,  MMM Lungs: Normal work of breathing. CTABL Heart: RRR no MRG Abd: NABS, Soft. Nondistended, Nontender Exts: Brisk capillary refill, warm and well perfused.  Skin left leg erythematous mildly tender warm to touch skin along the anterior shin area with no significant pitting edema.  Capillary refill is intact. Scalp: Small erythematous folliculitis appearing papule on the left occipital scalp tender to touch.  No fluctuance in this area.   No results found for this or any previous visit (from the past 72 hour(s)). No results found.    Assessment and Plan: 67 y.o. female with  Leg rash I think is cellulitis.  She is done well with Omnicef in the past.  Plan to repeat this and recheck in a week or so if not better.  Doubtful for venous stasis dermatitis at this time but that is always a possibility for [name].  Bump on head I think is folliculitis.  This is a new problem today.  It does not appear drainable at this time.  Plan for  treatment with Omnicef as noted above.  Will recheck if not improved.   No orders of the defined types were placed in this encounter.  Meds ordered this encounter  Medications  . cefdinir (OMNICEF) 300 MG capsule    Sig: Take 1 capsule (300 mg total) by mouth 2 (two) times daily.    Dispense:  14 capsule    Refill:  0     Discussed warning signs or symptoms. Please see discharge instructions. Patient expresses understanding.

## 2018-02-14 DIAGNOSIS — M7671 Peroneal tendinitis, right leg: Secondary | ICD-10-CM | POA: Diagnosis not present

## 2018-02-20 DIAGNOSIS — Z01810 Encounter for preprocedural cardiovascular examination: Secondary | ICD-10-CM | POA: Diagnosis not present

## 2018-02-20 DIAGNOSIS — I10 Essential (primary) hypertension: Secondary | ICD-10-CM | POA: Diagnosis not present

## 2018-02-22 DIAGNOSIS — D126 Benign neoplasm of colon, unspecified: Secondary | ICD-10-CM | POA: Diagnosis not present

## 2018-02-22 DIAGNOSIS — D124 Benign neoplasm of descending colon: Secondary | ICD-10-CM | POA: Diagnosis not present

## 2018-02-22 DIAGNOSIS — D123 Benign neoplasm of transverse colon: Secondary | ICD-10-CM | POA: Diagnosis not present

## 2018-02-22 DIAGNOSIS — E785 Hyperlipidemia, unspecified: Secondary | ICD-10-CM | POA: Diagnosis not present

## 2018-02-22 DIAGNOSIS — K635 Polyp of colon: Secondary | ICD-10-CM | POA: Diagnosis not present

## 2018-02-22 DIAGNOSIS — Z1211 Encounter for screening for malignant neoplasm of colon: Secondary | ICD-10-CM | POA: Diagnosis not present

## 2018-02-22 DIAGNOSIS — K648 Other hemorrhoids: Secondary | ICD-10-CM | POA: Diagnosis not present

## 2018-02-24 ENCOUNTER — Ambulatory Visit: Payer: Medicare Other | Admitting: Physician Assistant

## 2018-02-24 ENCOUNTER — Telehealth: Payer: Self-pay | Admitting: Physician Assistant

## 2018-02-24 MED ORDER — PREDNISONE 50 MG PO TABS: 50.0000 mg | ORAL_TABLET | Freq: Every day | ORAL | 0 refills | Status: DC

## 2018-02-24 NOTE — Telephone Encounter (Signed)
Pt reports she had to cancel and reschedule her appt today because her right foot is swollen and she cannot walk. Requesting Rx for prednisone be sent to pharmacy, [facility] on file. Requesting 5 50mg  tablets. Pt also had to cancel her cardiology appt today. She is rescheduled with PCP for Monday.

## 2018-02-24 NOTE — Telephone Encounter (Signed)
Pt advised, Rx sent.

## 2018-02-24 NOTE — Telephone Encounter (Signed)
Ok for prednisone 50mg  I po qd #5 nRF.

## 2018-02-25 DIAGNOSIS — T2112XA Burn of first degree of abdominal wall, initial encounter: Secondary | ICD-10-CM | POA: Diagnosis not present

## 2018-02-25 DIAGNOSIS — Z7951 Long term (current) use of inhaled steroids: Secondary | ICD-10-CM | POA: Diagnosis not present

## 2018-02-25 DIAGNOSIS — T24112A Burn of first degree of left thigh, initial encounter: Secondary | ICD-10-CM | POA: Diagnosis not present

## 2018-02-25 DIAGNOSIS — T24111A Burn of first degree of right thigh, initial encounter: Secondary | ICD-10-CM | POA: Diagnosis not present

## 2018-02-25 DIAGNOSIS — X101XXA Contact with hot food, initial encounter: Secondary | ICD-10-CM | POA: Diagnosis not present

## 2018-02-25 DIAGNOSIS — Z79899 Other long term (current) drug therapy: Secondary | ICD-10-CM | POA: Diagnosis not present

## 2018-02-25 DIAGNOSIS — Z880 Allergy status to penicillin: Secondary | ICD-10-CM | POA: Diagnosis not present

## 2018-02-25 DIAGNOSIS — T23102A Burn of first degree of left hand, unspecified site, initial encounter: Secondary | ICD-10-CM | POA: Diagnosis not present

## 2018-02-25 DIAGNOSIS — J449 Chronic obstructive pulmonary disease, unspecified: Secondary | ICD-10-CM | POA: Diagnosis not present

## 2018-02-25 DIAGNOSIS — I10 Essential (primary) hypertension: Secondary | ICD-10-CM | POA: Diagnosis not present

## 2018-02-25 DIAGNOSIS — T24139A Burn of first degree of unspecified lower leg, initial encounter: Secondary | ICD-10-CM | POA: Diagnosis not present

## 2018-02-25 DIAGNOSIS — M79606 Pain in leg, unspecified: Secondary | ICD-10-CM | POA: Diagnosis not present

## 2018-02-25 DIAGNOSIS — Z7984 Long term (current) use of oral hypoglycemic drugs: Secondary | ICD-10-CM | POA: Diagnosis not present

## 2018-02-25 DIAGNOSIS — T310 Burns involving less than 10% of body surface: Secondary | ICD-10-CM | POA: Diagnosis not present

## 2018-02-25 DIAGNOSIS — Z888 Allergy status to other drugs, medicaments and biological substances status: Secondary | ICD-10-CM | POA: Diagnosis not present

## 2018-02-25 DIAGNOSIS — T3122 Burns involving 20-29% of body surface with 20-29% third degree burns: Secondary | ICD-10-CM | POA: Diagnosis not present

## 2018-02-25 DIAGNOSIS — E119 Type 2 diabetes mellitus without complications: Secondary | ICD-10-CM | POA: Diagnosis not present

## 2018-02-25 DIAGNOSIS — T22112A Burn of first degree of left forearm, initial encounter: Secondary | ICD-10-CM | POA: Diagnosis not present

## 2018-02-25 DIAGNOSIS — Z882 Allergy status to sulfonamides status: Secondary | ICD-10-CM | POA: Diagnosis not present

## 2018-02-25 DIAGNOSIS — X100XXA Contact with hot drinks, initial encounter: Secondary | ICD-10-CM | POA: Diagnosis not present

## 2018-02-25 DIAGNOSIS — T2121XA Burn of second degree of chest wall, initial encounter: Secondary | ICD-10-CM | POA: Diagnosis not present

## 2018-02-27 ENCOUNTER — Encounter: Payer: Self-pay | Admitting: Physician Assistant

## 2018-02-27 ENCOUNTER — Ambulatory Visit (INDEPENDENT_AMBULATORY_CARE_PROVIDER_SITE_OTHER): Payer: Medicare Other | Admitting: Physician Assistant

## 2018-02-27 VITALS — BP 134/52 | HR 85

## 2018-02-27 DIAGNOSIS — I700 Atherosclerosis of aorta: Secondary | ICD-10-CM | POA: Diagnosis not present

## 2018-02-27 DIAGNOSIS — M25511 Pain in right shoulder: Secondary | ICD-10-CM

## 2018-02-27 DIAGNOSIS — T2122XD Burn of second degree of abdominal wall, subsequent encounter: Secondary | ICD-10-CM | POA: Diagnosis not present

## 2018-02-27 DIAGNOSIS — F331 Major depressive disorder, recurrent, moderate: Secondary | ICD-10-CM

## 2018-02-27 DIAGNOSIS — E7800 Pure hypercholesterolemia, unspecified: Secondary | ICD-10-CM

## 2018-02-27 DIAGNOSIS — I872 Venous insufficiency (chronic) (peripheral): Secondary | ICD-10-CM

## 2018-02-27 DIAGNOSIS — I10 Essential (primary) hypertension: Secondary | ICD-10-CM

## 2018-02-27 DIAGNOSIS — E119 Type 2 diabetes mellitus without complications: Secondary | ICD-10-CM

## 2018-02-27 DIAGNOSIS — G8929 Other chronic pain: Secondary | ICD-10-CM

## 2018-02-27 DIAGNOSIS — J42 Unspecified chronic bronchitis: Secondary | ICD-10-CM

## 2018-02-27 DIAGNOSIS — D126 Benign neoplasm of colon, unspecified: Secondary | ICD-10-CM

## 2018-02-27 DIAGNOSIS — M170 Bilateral primary osteoarthritis of knee: Secondary | ICD-10-CM

## 2018-02-27 DIAGNOSIS — R7301 Impaired fasting glucose: Secondary | ICD-10-CM

## 2018-02-27 DIAGNOSIS — T2122XA Burn of second degree of abdominal wall, initial encounter: Secondary | ICD-10-CM | POA: Insufficient documentation

## 2018-02-27 MED ORDER — HYDROCODONE-ACETAMINOPHEN 5-325 MG PO TABS: 1.0000 | ORAL_TABLET | Freq: Four times a day (QID) | ORAL | 0 refills | Status: DC | PRN

## 2018-02-27 MED ORDER — PROMETHAZINE HCL 25 MG PO TABS: 25.0000 mg | ORAL_TABLET | Freq: Four times a day (QID) | ORAL | 0 refills | Status: DC | PRN

## 2018-02-27 NOTE — Patient Instructions (Signed)
Burn Care, Adult A burn is an injury to the skin or the tissues under the skin. There are three types of burns:  First degree. These burns may cause the skin to be red and a bit swollen.  Second degree. These burns are very painful and cause the skin to be very red. The skin may also leak fluid, look shiny, and start to have blisters.  Third degree. These burns cause permanent damage. They turn the skin white or black and make it look charred, dry, and leathery.  Taking care of your burn properly can help to prevent pain and infection. It can also help the burn to heal more quickly. How is this treated? Right after a burn:  Rinse or soak the burn under cool water. Do this for several minutes. Do not put ice on your burn. That can cause more damage.  Lightly cover the burn with a clean (sterile) cloth (dressing). Burn care  Raise (elevate) the injured area above the level of your heart while sitting or lying down.  Follow instructions from your doctor about: ? How to clean and take care of the burn. ? When to change and remove the cloth.  Check your burn every day for signs of infection. Check for: ? More redness, swelling, or pain. ? Warmth. ? Pus or a bad smell. Medicine   Take over-the-counter and prescription medicines only as told by your doctor.  If you were prescribed antibiotic medicine, take or apply it as told by your doctor. Do not stop using the antibiotic even if your condition improves. General instructions  To prevent infection: ? Do not put butter, oil, or other home treatments on the burn. ? Do not scratch or pick at the burn. ? Do not break any blisters. ? Do not peel skin.  Do not rub your burn, even when you are cleaning it.  Protect your burn from the sun. Contact a doctor if:  Your condition does not get better.  Your condition gets worse.  You have a fever.  Your burn looks different or starts to have black or red spots on it.  Your burn  feels warm to the touch.  Your pain is not controlled with medicine. Get help right away if:  You have redness, swelling, or pain at the site of the burn.  You have fluid, blood, or pus coming from your burn.  You have red streaks near the burn.  You have very bad pain. This information is not intended to replace advice given to you by your health care provider. Make sure you discuss any questions you have with your health care provider. Document Released: 09/21/2008 Document Revised: 01/29/2017 Document Reviewed: 06/01/2016 Elsevier Interactive Patient Education  2018 Elsevier Inc.  
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
## 2018-02-28 ENCOUNTER — Encounter: Payer: Self-pay | Admitting: Physician Assistant

## 2018-02-28 DIAGNOSIS — D126 Benign neoplasm of colon, unspecified: Secondary | ICD-10-CM | POA: Insufficient documentation

## 2018-02-28 LAB — LIPID PANEL W/REFLEX DIRECT LDL
Cholesterol: 154 mg/dL (ref <200)
HDL: 45 mg/dL — ABNORMAL LOW (ref >50)
LDL Cholesterol (Calc): 77 mg/dL (calc)
Non-HDL Cholesterol (Calc): 109 mg/dL (calc) (ref <130)
Total CHOL/HDL Ratio: 3.4 (calc) (ref <5.0)
Triglycerides: 231 mg/dL — ABNORMAL HIGH (ref <150)

## 2018-02-28 LAB — COMPLETE METABOLIC PANEL WITH GFR
AG Ratio: 1.2 (calc) (ref 1.0–2.5)
ALT: 9 U/L (ref 6–29)
AST: 11 U/L (ref 10–35)
Albumin: 3.3 g/dL — ABNORMAL LOW (ref 3.6–5.1)
Alkaline phosphatase (APISO): 75 U/L (ref 33–130)
BUN/Creatinine Ratio: 20 (calc) (ref 6–22)
BUN: 25 mg/dL (ref 7–25)
CO2: 29 mmol/L (ref 20–32)
Calcium: 8.8 mg/dL (ref 8.6–10.4)
Chloride: 105 mmol/L (ref 98–110)
Creat: 1.23 mg/dL — ABNORMAL HIGH (ref 0.50–0.99)
GFR, Est African American: 53 mL/min/{1.73_m2} — ABNORMAL LOW (ref >=60)
GFR, Est Non African American: 46 mL/min/{1.73_m2} — ABNORMAL LOW (ref >=60)
Globulin: 2.7 g/dL (calc) (ref 1.9–3.7)
Glucose, Bld: 82 mg/dL (ref 65–99)
Potassium: 4 mmol/L (ref 3.5–5.3)
Sodium: 141 mmol/L (ref 135–146)
Total Bilirubin: 0.3 mg/dL (ref 0.2–1.2)
Total Protein: 6 g/dL — ABNORMAL LOW (ref 6.1–8.1)

## 2018-02-28 LAB — HEMOGLOBIN A1C
Hgb A1c MFr Bld: 5.9 % of total Hgb — ABNORMAL HIGH (ref <5.7)
Mean Plasma Glucose: 123 (calc)
eAG (mmol/L): 6.8 (calc)

## 2018-02-28 MED ORDER — SPIRONOLACTONE 25 MG PO TABS: 25.0000 mg | ORAL_TABLET | Freq: Every day | ORAL | 1 refills | Status: DC

## 2018-02-28 MED ORDER — METFORMIN HCL 1000 MG PO TABS: 1000.0000 mg | ORAL_TABLET | Freq: Two times a day (BID) | ORAL | 2 refills | Status: DC

## 2018-02-28 MED ORDER — CITALOPRAM HYDROBROMIDE 40 MG PO TABS: 40.0000 mg | ORAL_TABLET | Freq: Every day | ORAL | 1 refills | Status: DC

## 2018-02-28 MED ORDER — GLIPIZIDE 5 MG PO TABS: ORAL_TABLET | ORAL | 1 refills | Status: DC

## 2018-02-28 MED ORDER — BUMETANIDE 1 MG PO TABS: 1.0000 mg | ORAL_TABLET | Freq: Two times a day (BID) | ORAL | 1 refills | Status: DC

## 2018-02-28 MED ORDER — PREDNISONE 50 MG PO TABS: 50.0000 mg | ORAL_TABLET | Freq: Every day | ORAL | 0 refills | Status: DC

## 2018-02-28 MED ORDER — ALBUTEROL SULFATE HFA 108 (90 BASE) MCG/ACT IN AERS: 1.0000 | INHALATION_SPRAY | Freq: Four times a day (QID) | RESPIRATORY_TRACT | 5 refills | Status: DC | PRN

## 2018-02-28 MED ORDER — LOSARTAN POTASSIUM 25 MG PO TABS: 25.0000 mg | ORAL_TABLET | Freq: Every day | ORAL | 1 refills | Status: DC

## 2018-02-28 MED ORDER — DULOXETINE HCL 60 MG PO CPEP
ORAL_CAPSULE | ORAL | 1 refills | Status: DC
Start: 2018-02-28 — End: 2018-03-01

## 2018-02-28 MED ORDER — ATORVASTATIN CALCIUM 40 MG PO TABS: 40.0000 mg | ORAL_TABLET | Freq: Every day | ORAL | 1 refills | Status: DC

## 2018-03-01 ENCOUNTER — Other Ambulatory Visit: Payer: Self-pay

## 2018-03-01 ENCOUNTER — Encounter: Payer: Self-pay | Admitting: Physician Assistant

## 2018-03-01 DIAGNOSIS — I10 Essential (primary) hypertension: Secondary | ICD-10-CM

## 2018-03-01 DIAGNOSIS — I700 Atherosclerosis of aorta: Secondary | ICD-10-CM

## 2018-03-01 DIAGNOSIS — J42 Unspecified chronic bronchitis: Secondary | ICD-10-CM

## 2018-03-01 DIAGNOSIS — E119 Type 2 diabetes mellitus without complications: Secondary | ICD-10-CM

## 2018-03-01 DIAGNOSIS — F331 Major depressive disorder, recurrent, moderate: Secondary | ICD-10-CM

## 2018-03-01 MED ORDER — CITALOPRAM HYDROBROMIDE 40 MG PO TABS: 40.0000 mg | ORAL_TABLET | Freq: Every day | ORAL | 1 refills | Status: DC

## 2018-03-01 MED ORDER — SPIRONOLACTONE 25 MG PO TABS: 25.0000 mg | ORAL_TABLET | Freq: Every day | ORAL | 1 refills | Status: DC

## 2018-03-01 MED ORDER — LOSARTAN POTASSIUM 25 MG PO TABS: 25.0000 mg | ORAL_TABLET | Freq: Every day | ORAL | 1 refills | Status: DC

## 2018-03-01 MED ORDER — DULOXETINE HCL 60 MG PO CPEP: ORAL_CAPSULE | ORAL | 1 refills | Status: DC

## 2018-03-01 MED ORDER — GLIPIZIDE 5 MG PO TABS: ORAL_TABLET | ORAL | 1 refills | Status: DC

## 2018-03-01 MED ORDER — ALBUTEROL SULFATE HFA 108 (90 BASE) MCG/ACT IN AERS: 1.0000 | INHALATION_SPRAY | Freq: Four times a day (QID) | RESPIRATORY_TRACT | 5 refills | Status: AC | PRN

## 2018-03-01 MED ORDER — METFORMIN HCL 1000 MG PO TABS: 1000.0000 mg | ORAL_TABLET | Freq: Two times a day (BID) | ORAL | 2 refills | Status: DC

## 2018-03-01 MED ORDER — ATORVASTATIN CALCIUM 40 MG PO TABS: 40.0000 mg | ORAL_TABLET | Freq: Every day | ORAL | 1 refills | Status: DC

## 2018-03-01 NOTE — Progress Notes (Signed)
Call pt: a1c improved some. Great job.  LDL is good.  TG elevated. I would consider adding fish oil 4000mg .

## 2018-03-08 ENCOUNTER — Telehealth: Payer: Self-pay | Admitting: Physician Assistant

## 2018-03-08 MED ORDER — SILVER SULFADIAZINE 1 % EX CREA: 1.0000 "application " | TOPICAL_CREAM | Freq: Two times a day (BID) | CUTANEOUS | 1 refills | Status: DC

## 2018-03-08 NOTE — Telephone Encounter (Signed)
Pt advised.
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
## 2018-03-08 NOTE — Telephone Encounter (Signed)
Sent to pharmacy 

## 2018-03-14 DIAGNOSIS — J449 Chronic obstructive pulmonary disease, unspecified: Secondary | ICD-10-CM | POA: Diagnosis not present

## 2018-03-14 DIAGNOSIS — T2122XD Burn of second degree of abdominal wall, subsequent encounter: Secondary | ICD-10-CM | POA: Diagnosis not present

## 2018-03-14 DIAGNOSIS — Z79899 Other long term (current) drug therapy: Secondary | ICD-10-CM | POA: Diagnosis not present

## 2018-03-14 DIAGNOSIS — Z7984 Long term (current) use of oral hypoglycemic drugs: Secondary | ICD-10-CM | POA: Diagnosis not present

## 2018-03-14 DIAGNOSIS — J45909 Unspecified asthma, uncomplicated: Secondary | ICD-10-CM | POA: Diagnosis not present

## 2018-03-14 DIAGNOSIS — N183 Chronic kidney disease, stage 3 (moderate): Secondary | ICD-10-CM | POA: Diagnosis not present

## 2018-03-14 DIAGNOSIS — I129 Hypertensive chronic kidney disease with stage 1 through stage 4 chronic kidney disease, or unspecified chronic kidney disease: Secondary | ICD-10-CM | POA: Diagnosis not present

## 2018-03-14 DIAGNOSIS — T310 Burns involving less than 10% of body surface: Secondary | ICD-10-CM | POA: Diagnosis not present

## 2018-03-14 DIAGNOSIS — T25222D Burn of second degree of left foot, subsequent encounter: Secondary | ICD-10-CM | POA: Diagnosis not present

## 2018-03-14 DIAGNOSIS — E785 Hyperlipidemia, unspecified: Secondary | ICD-10-CM | POA: Diagnosis not present

## 2018-03-14 DIAGNOSIS — Z86711 Personal history of pulmonary embolism: Secondary | ICD-10-CM | POA: Diagnosis not present

## 2018-03-14 DIAGNOSIS — K3184 Gastroparesis: Secondary | ICD-10-CM | POA: Diagnosis not present

## 2018-03-14 DIAGNOSIS — E1143 Type 2 diabetes mellitus with diabetic autonomic (poly)neuropathy: Secondary | ICD-10-CM | POA: Diagnosis not present

## 2018-03-14 DIAGNOSIS — T2220XD Burn of second degree of shoulder and upper limb, except wrist and hand, unspecified site, subsequent encounter: Secondary | ICD-10-CM | POA: Diagnosis not present

## 2018-03-14 DIAGNOSIS — E1122 Type 2 diabetes mellitus with diabetic chronic kidney disease: Secondary | ICD-10-CM | POA: Diagnosis not present

## 2018-03-14 DIAGNOSIS — T23202D Burn of second degree of left hand, unspecified site, subsequent encounter: Secondary | ICD-10-CM | POA: Diagnosis not present

## 2018-03-14 DIAGNOSIS — G4733 Obstructive sleep apnea (adult) (pediatric): Secondary | ICD-10-CM | POA: Diagnosis not present

## 2018-03-16 DIAGNOSIS — M7671 Peroneal tendinitis, right leg: Secondary | ICD-10-CM | POA: Diagnosis not present

## 2018-03-16 DIAGNOSIS — M25371 Other instability, right ankle: Secondary | ICD-10-CM | POA: Diagnosis not present

## 2018-03-23 DIAGNOSIS — E1122 Type 2 diabetes mellitus with diabetic chronic kidney disease: Secondary | ICD-10-CM | POA: Diagnosis not present

## 2018-03-23 DIAGNOSIS — Z48817 Encounter for surgical aftercare following surgery on the skin and subcutaneous tissue: Secondary | ICD-10-CM | POA: Diagnosis not present

## 2018-03-23 DIAGNOSIS — J449 Chronic obstructive pulmonary disease, unspecified: Secondary | ICD-10-CM | POA: Diagnosis not present

## 2018-03-23 DIAGNOSIS — Z7984 Long term (current) use of oral hypoglycemic drugs: Secondary | ICD-10-CM | POA: Diagnosis not present

## 2018-03-23 DIAGNOSIS — T2131XA Burn of third degree of chest wall, initial encounter: Secondary | ICD-10-CM | POA: Diagnosis not present

## 2018-03-23 DIAGNOSIS — R000 Tachycardia, unspecified: Secondary | ICD-10-CM | POA: Diagnosis not present

## 2018-03-23 DIAGNOSIS — G4733 Obstructive sleep apnea (adult) (pediatric): Secondary | ICD-10-CM | POA: Diagnosis present

## 2018-03-23 DIAGNOSIS — G8911 Acute pain due to trauma: Secondary | ICD-10-CM | POA: Diagnosis present

## 2018-03-23 DIAGNOSIS — T310 Burns involving less than 10% of body surface: Secondary | ICD-10-CM | POA: Diagnosis not present

## 2018-03-23 DIAGNOSIS — Z9989 Dependence on other enabling machines and devices: Secondary | ICD-10-CM | POA: Diagnosis not present

## 2018-03-23 DIAGNOSIS — D649 Anemia, unspecified: Secondary | ICD-10-CM | POA: Diagnosis not present

## 2018-03-23 DIAGNOSIS — N183 Chronic kidney disease, stage 3 (moderate): Secondary | ICD-10-CM | POA: Diagnosis not present

## 2018-03-23 DIAGNOSIS — G8929 Other chronic pain: Secondary | ICD-10-CM | POA: Diagnosis not present

## 2018-03-23 DIAGNOSIS — G4700 Insomnia, unspecified: Secondary | ICD-10-CM | POA: Diagnosis not present

## 2018-03-23 DIAGNOSIS — E1165 Type 2 diabetes mellitus with hyperglycemia: Secondary | ICD-10-CM | POA: Diagnosis not present

## 2018-03-23 DIAGNOSIS — R6510 Systemic inflammatory response syndrome (SIRS) of non-infectious origin without acute organ dysfunction: Secondary | ICD-10-CM | POA: Diagnosis not present

## 2018-03-28 ENCOUNTER — Other Ambulatory Visit: Payer: Self-pay | Admitting: Physician Assistant

## 2018-03-28 ENCOUNTER — Telehealth: Payer: Self-pay | Admitting: Physician Assistant

## 2018-03-28 DIAGNOSIS — I872 Venous insufficiency (chronic) (peripheral): Secondary | ICD-10-CM

## 2018-03-28 DIAGNOSIS — M170 Bilateral primary osteoarthritis of knee: Secondary | ICD-10-CM
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
## 2018-03-30 DIAGNOSIS — T2101XD Burn of unspecified degree of chest wall, subsequent encounter: Secondary | ICD-10-CM | POA: Diagnosis not present

## 2018-03-30 DIAGNOSIS — Z882 Allergy status to sulfonamides status: Secondary | ICD-10-CM | POA: Diagnosis not present

## 2018-03-30 DIAGNOSIS — Z888 Allergy status to other drugs, medicaments and biological substances status: Secondary | ICD-10-CM | POA: Diagnosis not present

## 2018-03-31 MED ORDER — PREDNISONE 50 MG PO TABS: 50.0000 mg | ORAL_TABLET | Freq: Every day | ORAL | 0 refills | Status: DC

## 2018-03-31 NOTE — Telephone Encounter (Signed)
Called for update - advised that we would do another Prednisone Burst (50mg  daily for 5 days). They will ask the Provider and call us back with recommendation.

## 2018-03-31 NOTE — Telephone Encounter (Signed)
Received callback from [facility] with a verbal ok to prescribe Prednisone. Per PCP verbal, Rx will be sent to local pharmacy on file. Left VM with update on pt's VM, callback provided for any questions.

## 2018-03-31 NOTE — Telephone Encounter (Signed)
Thank you kelsi. I will go ahead and give you verbal that I am ok with it if the wound/center approves this.

## 2018-04-03 DIAGNOSIS — Z9889 Other specified postprocedural states: Secondary | ICD-10-CM | POA: Diagnosis not present

## 2018-04-03 DIAGNOSIS — Z79899 Other long term (current) drug therapy: Secondary | ICD-10-CM | POA: Diagnosis not present

## 2018-04-03 DIAGNOSIS — N611 Abscess of the breast and nipple: Secondary | ICD-10-CM | POA: Diagnosis not present

## 2018-04-03 DIAGNOSIS — T310 Burns involving less than 10% of body surface: Secondary | ICD-10-CM | POA: Diagnosis not present

## 2018-04-03 DIAGNOSIS — L0291 Cutaneous abscess, unspecified: Secondary | ICD-10-CM | POA: Diagnosis not present

## 2018-04-03 DIAGNOSIS — L03116 Cellulitis of left lower limb: Secondary | ICD-10-CM | POA: Diagnosis not present

## 2018-04-03 DIAGNOSIS — R21 Rash and other nonspecific skin eruption: Secondary | ICD-10-CM | POA: Diagnosis not present

## 2018-04-19 ENCOUNTER — Telehealth: Payer: Self-pay | Admitting: Physician Assistant

## 2018-04-19 DIAGNOSIS — M25511 Pain in right shoulder: Principal | ICD-10-CM

## 2018-04-19 DIAGNOSIS — G8929 Other chronic pain: Secondary | ICD-10-CM

## 2018-04-19 MED ORDER — HYDROCODONE-ACETAMINOPHEN 5-325 MG PO TABS: 1.0000 | ORAL_TABLET | Freq: Four times a day (QID) | ORAL | 0 refills | Status: DC | PRN
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
## 2018-05-03 ENCOUNTER — Telehealth: Payer: Self-pay | Admitting: Physician Assistant

## 2018-05-03 DIAGNOSIS — X58XXXA Exposure to other specified factors, initial encounter: Secondary | ICD-10-CM | POA: Diagnosis not present

## 2018-05-03 DIAGNOSIS — Y999 Unspecified external cause status: Secondary | ICD-10-CM | POA: Diagnosis not present

## 2018-05-03 DIAGNOSIS — R072 Precordial pain: Secondary | ICD-10-CM | POA: Diagnosis not present

## 2018-05-03 DIAGNOSIS — Z79899 Other long term (current) drug therapy: Secondary | ICD-10-CM | POA: Diagnosis not present

## 2018-05-03 DIAGNOSIS — L03116 Cellulitis of left lower limb: Secondary | ICD-10-CM | POA: Diagnosis not present

## 2018-05-03 DIAGNOSIS — S81802A Unspecified open wound, left lower leg, initial encounter: Secondary | ICD-10-CM | POA: Diagnosis not present

## 2018-05-03 DIAGNOSIS — R110 Nausea: Secondary | ICD-10-CM | POA: Diagnosis not present

## 2018-05-03 DIAGNOSIS — R600 Localized edema: Secondary | ICD-10-CM | POA: Diagnosis not present

## 2018-05-03 DIAGNOSIS — R079 Chest pain, unspecified: Secondary | ICD-10-CM | POA: Diagnosis not present

## 2018-05-03 DIAGNOSIS — R0602 Shortness of breath: Secondary | ICD-10-CM | POA: Diagnosis not present
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
## 2018-05-04 DIAGNOSIS — R600 Localized edema: Secondary | ICD-10-CM | POA: Diagnosis not present

## 2018-05-04 DIAGNOSIS — L03116 Cellulitis of left lower limb: Secondary | ICD-10-CM | POA: Diagnosis not present

## 2018-05-04 DIAGNOSIS — R072 Precordial pain: Secondary | ICD-10-CM | POA: Diagnosis not present

## 2018-05-04 DIAGNOSIS — R110 Nausea: Secondary | ICD-10-CM | POA: Diagnosis not present

## 2018-05-04 DIAGNOSIS — S81802A Unspecified open wound, left lower leg, initial encounter: Secondary | ICD-10-CM | POA: Diagnosis not present

## 2018-05-04 DIAGNOSIS — Y999 Unspecified external cause status: Secondary | ICD-10-CM | POA: Diagnosis not present

## 2018-05-04 DIAGNOSIS — X58XXXA Exposure to other specified factors, initial encounter: Secondary | ICD-10-CM | POA: Diagnosis not present

## 2018-05-04 DIAGNOSIS — R0602 Shortness of breath: Secondary | ICD-10-CM | POA: Diagnosis not present

## 2018-05-09 ENCOUNTER — Telehealth: Payer: Self-pay | Admitting: Emergency Medicine

## 2018-05-09 DIAGNOSIS — G8929 Other chronic pain: Secondary | ICD-10-CM

## 2018-05-09 DIAGNOSIS — M25511 Pain in right shoulder: Principal | ICD-10-CM

## 2018-05-09 MED ORDER — HYDROCODONE-ACETAMINOPHEN 5-325 MG PO TABS: 1.0000 | ORAL_TABLET | Freq: Four times a day (QID) | ORAL | 0 refills | Status: DC | PRN

## 2018-05-09 NOTE — Telephone Encounter (Signed)
Left VM with status update.  

## 2018-05-09 NOTE — Telephone Encounter (Signed)
Sent to pharmacy 

## 2018-05-09 NOTE — Telephone Encounter (Signed)
Patient calling requesting refill on vicodin because her ER visit resulted in ABX for 14 days for her leg problem and she was told to take pain med q 6 hours routinely which will lead to running out before end of antibiotic treatment; she also is to see plastic surgeon 5/17 since upper leg not healing well. Requests call back.

## 2018-05-18 DIAGNOSIS — M2041 Other hammer toe(s) (acquired), right foot: Secondary | ICD-10-CM | POA: Diagnosis not present

## 2018-05-18 DIAGNOSIS — M2042 Other hammer toe(s) (acquired), left foot: Secondary | ICD-10-CM | POA: Diagnosis not present

## 2018-05-23 DIAGNOSIS — H1013 Acute atopic conjunctivitis, bilateral: Secondary | ICD-10-CM | POA: Diagnosis not present

## 2018-05-23 DIAGNOSIS — I83003 Varicose veins of unspecified lower extremity with ulcer of ankle: Secondary | ICD-10-CM | POA: Diagnosis not present

## 2018-05-23 DIAGNOSIS — L97308 Non-pressure chronic ulcer of unspecified ankle with other specified severity: Secondary | ICD-10-CM | POA: Diagnosis not present

## 2018-05-23 DIAGNOSIS — I83218 Varicose veins of right lower extremity with both ulcer of other part of lower extremity and inflammation: Secondary | ICD-10-CM | POA: Diagnosis not present

## 2018-05-23 DIAGNOSIS — I8312 Varicose veins of left lower extremity with inflammation: Secondary | ICD-10-CM | POA: Diagnosis not present

## 2018-05-23 DIAGNOSIS — Z961 Presence of intraocular lens: Secondary | ICD-10-CM | POA: Diagnosis not present

## 2018-05-23 DIAGNOSIS — H25813 Combined forms of age-related cataract, bilateral: Secondary | ICD-10-CM | POA: Diagnosis not present

## 2018-05-23 DIAGNOSIS — L97811 Non-pressure chronic ulcer of other part of right lower leg limited to breakdown of skin: Secondary | ICD-10-CM | POA: Diagnosis not present

## 2018-05-23 DIAGNOSIS — H02834 Dermatochalasis of left upper eyelid: Secondary | ICD-10-CM | POA: Diagnosis not present

## 2018-05-23 DIAGNOSIS — Z79899 Other long term (current) drug therapy: Secondary | ICD-10-CM | POA: Diagnosis not present

## 2018-05-23 DIAGNOSIS — H02831 Dermatochalasis of right upper eyelid: Secondary | ICD-10-CM | POA: Diagnosis not present

## 2018-05-23 DIAGNOSIS — H353131 Nonexudative age-related macular degeneration, bilateral, early dry stage: Secondary | ICD-10-CM | POA: Diagnosis not present

## 2018-05-30 ENCOUNTER — Ambulatory Visit: Payer: Medicare Other | Admitting: Physician Assistant

## 2018-05-31 ENCOUNTER — Ambulatory Visit (INDEPENDENT_AMBULATORY_CARE_PROVIDER_SITE_OTHER): Payer: Medicare Other | Admitting: Physician Assistant

## 2018-05-31 ENCOUNTER — Encounter: Payer: Self-pay | Admitting: Physician Assistant

## 2018-05-31 VITALS — BP 97/41 | HR 86 | Ht 60.0 in | Wt 310.0 lb

## 2018-05-31 DIAGNOSIS — F419 Anxiety disorder, unspecified: Secondary | ICD-10-CM

## 2018-05-31 DIAGNOSIS — Z9989 Dependence on other enabling machines and devices: Secondary | ICD-10-CM

## 2018-05-31 DIAGNOSIS — E0842 Diabetes mellitus due to underlying condition with diabetic polyneuropathy: Secondary | ICD-10-CM

## 2018-05-31 DIAGNOSIS — N6320 Unspecified lump in the left breast, unspecified quadrant: Secondary | ICD-10-CM | POA: Diagnosis not present

## 2018-05-31 DIAGNOSIS — L989 Disorder of the skin and subcutaneous tissue, unspecified: Secondary | ICD-10-CM | POA: Diagnosis not present

## 2018-05-31 DIAGNOSIS — J42 Unspecified chronic bronchitis: Secondary | ICD-10-CM

## 2018-05-31 DIAGNOSIS — L03115 Cellulitis of right lower limb: Secondary | ICD-10-CM

## 2018-05-31 DIAGNOSIS — M25511 Pain in right shoulder: Secondary | ICD-10-CM

## 2018-05-31 DIAGNOSIS — G4733 Obstructive sleep apnea (adult) (pediatric): Secondary | ICD-10-CM

## 2018-05-31 DIAGNOSIS — M2042 Other hammer toe(s) (acquired), left foot: Secondary | ICD-10-CM

## 2018-05-31 DIAGNOSIS — M2041 Other hammer toe(s) (acquired), right foot: Secondary | ICD-10-CM

## 2018-05-31 DIAGNOSIS — G8929 Other chronic pain: Secondary | ICD-10-CM

## 2018-05-31 DIAGNOSIS — D2262 Melanocytic nevi of left upper limb, including shoulder: Secondary | ICD-10-CM | POA: Diagnosis not present

## 2018-05-31 MED ORDER — HYDROXYZINE PAMOATE 25 MG PO CAPS: 25.0000 mg | ORAL_CAPSULE | Freq: Three times a day (TID) | ORAL | 1 refills | Status: DC | PRN

## 2018-05-31 MED ORDER — HYDROCODONE-ACETAMINOPHEN 5-325 MG PO TABS: 1.0000 | ORAL_TABLET | Freq: Four times a day (QID) | ORAL | 0 refills | Status: DC | PRN

## 2018-05-31 MED ORDER — ALPRAZOLAM 0.5 MG PO TABS: 0.5000 mg | ORAL_TABLET | Freq: Every evening | ORAL | 0 refills | Status: DC | PRN

## 2018-05-31 MED ORDER — CLINDAMYCIN HCL 300 MG PO CAPS: 300.0000 mg | ORAL_CAPSULE | Freq: Three times a day (TID) | ORAL | 0 refills | Status: DC
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
## 2018-06-01 LAB — COMPLETE METABOLIC PANEL WITH GFR
AG Ratio: 1.2 (calc) (ref 1.0–2.5)
ALT: 7 U/L (ref 6–29)
AST: 13 U/L (ref 10–35)
Albumin: 3.7 g/dL (ref 3.6–5.1)
Alkaline phosphatase (APISO): 88 U/L (ref 33–130)
BUN/Creatinine Ratio: 27 (calc) — ABNORMAL HIGH (ref 6–22)
BUN: 42 mg/dL — ABNORMAL HIGH (ref 7–25)
CO2: 29 mmol/L (ref 20–32)
Calcium: 9.8 mg/dL (ref 8.6–10.4)
Chloride: 95 mmol/L — ABNORMAL LOW (ref 98–110)
Creat: 1.57 mg/dL — ABNORMAL HIGH (ref 0.50–0.99)
GFR, Est African American: 40 mL/min/{1.73_m2} — ABNORMAL LOW (ref >=60)
GFR, Est Non African American: 34 mL/min/{1.73_m2} — ABNORMAL LOW (ref >=60)
Globulin: 3.1 g/dL (calc) (ref 1.9–3.7)
Glucose, Bld: 117 mg/dL — ABNORMAL HIGH (ref 65–99)
Potassium: 5 mmol/L (ref 3.5–5.3)
Sodium: 134 mmol/L — ABNORMAL LOW (ref 135–146)
Total Bilirubin: 0.5 mg/dL (ref 0.2–1.2)
Total Protein: 6.8 g/dL (ref 6.1–8.1)

## 2018-06-01 LAB — HEMOGLOBIN A1C
Hgb A1c MFr Bld: 5.6 % of total Hgb (ref <5.7)
Mean Plasma Glucose: 114 (calc)
eAG (mmol/L): 6.3 (calc)

## 2018-06-02 ENCOUNTER — Telehealth: Payer: Self-pay

## 2018-06-02 NOTE — Progress Notes (Signed)
Call pt: A!C is better 5.6. Kidney function is up from 3 months ago. Make sure staying hydrated. Avoid NSAIDs. Recheck in 2-3 weeks.  Total protein is much better.

## 2018-06-02 NOTE — Progress Notes (Signed)
Ok to pend orders for lipid and bmp for 2-3 weeks.

## 2018-06-02 NOTE — Telephone Encounter (Signed)
Pt advised of results and they have been placed up front for her to pick up.

## 2018-06-02 NOTE — Telephone Encounter (Signed)
Pt called requesting results of her lab work.   I advised pt that it was done on Wednesday and her provider is out of the office on Thursday- so it has not yet been reviewed. Pt requests that labs be reviewed today and that a copy be put up front for her to pick up this afternoon.   Advised pt that provider is currently in clinic, but I would route her a msg concerning labs.  Note to [place].

## 2018-06-02 NOTE — Telephone Encounter (Signed)
Call placed to pt concerning results by Onalee Hua., LPN. Awaiting pt call back

## 2018-06-02 NOTE — Telephone Encounter (Signed)
See result note.  

## 2018-06-04 ENCOUNTER — Encounter: Payer: Self-pay | Admitting: Physician Assistant

## 2018-06-04 DIAGNOSIS — L219 Seborrheic dermatitis, unspecified: Secondary | ICD-10-CM | POA: Insufficient documentation

## 2018-06-04 NOTE — Progress Notes (Signed)
Call pt: lesion did have some moderate atypia so I am glad we removed it. Appears margins are free and we got it all.

## 2018-06-05 ENCOUNTER — Other Ambulatory Visit: Payer: Self-pay | Admitting: Physician Assistant

## 2018-06-05 ENCOUNTER — Encounter: Payer: Self-pay | Admitting: Physician Assistant

## 2018-06-05 DIAGNOSIS — L03115 Cellulitis of right lower limb: Secondary | ICD-10-CM | POA: Insufficient documentation

## 2018-06-05 DIAGNOSIS — M2042 Other hammer toe(s) (acquired), left foot: Secondary | ICD-10-CM | POA: Insufficient documentation

## 2018-06-05 DIAGNOSIS — M2041 Other hammer toe(s) (acquired), right foot: Secondary | ICD-10-CM | POA: Insufficient documentation

## 2018-06-05 DIAGNOSIS — L989 Disorder of the skin and subcutaneous tissue, unspecified: Secondary | ICD-10-CM | POA: Insufficient documentation

## 2018-06-05 DIAGNOSIS — E0842 Diabetes mellitus due to underlying condition with diabetic polyneuropathy: Secondary | ICD-10-CM

## 2018-06-05 DIAGNOSIS — E7800 Pure hypercholesterolemia, unspecified: Secondary | ICD-10-CM

## 2018-06-05 DIAGNOSIS — N6320 Unspecified lump in the left breast, unspecified quadrant: Secondary | ICD-10-CM | POA: Insufficient documentation

## 2018-06-05 NOTE — Progress Notes (Signed)
Per result note from 05-31-18: "Notes recorded by Donella Stade, PA-C on 06/02/2018 at 2:57 PM EDT Ok to pend orders for lipid and bmp for 2-3 weeks."  Future labs ordered, pt aware.

## 2018-06-12 ENCOUNTER — Telehealth: Payer: Self-pay | Admitting: Physician Assistant

## 2018-06-12 DIAGNOSIS — M170 Bilateral primary osteoarthritis of knee: Secondary | ICD-10-CM

## 2018-06-12 MED ORDER — PREDNISONE 50 MG PO TABS: 50.0000 mg | ORAL_TABLET | Freq: Every day | ORAL | 0 refills | Status: DC

## 2018-06-12 NOTE — Telephone Encounter (Signed)
Pt called clinic today requesting an Rx for Prednisone. Pt states she is having some swelling in her feet due to the weather, the rain makes her joints "flare up." Pt's niece is getting married this weekend so she will be busy with the rehearsal dinner and the wedding. She wants to be able to "move around good." Will route.

## 2018-06-12 NOTE — Telephone Encounter (Signed)
Sent to pharmacy 

## 2018-06-12 NOTE — Telephone Encounter (Signed)
Left VM with update.  

## 2018-06-15 DIAGNOSIS — Z1289 Encounter for screening for malignant neoplasm of other sites: Secondary | ICD-10-CM | POA: Diagnosis not present

## 2018-06-15 DIAGNOSIS — K7581 Nonalcoholic steatohepatitis (NASH): Secondary | ICD-10-CM | POA: Diagnosis not present

## 2018-06-23 ENCOUNTER — Ambulatory Visit: Payer: Medicare Other | Admitting: Sports Medicine

## 2018-06-28 ENCOUNTER — Telehealth: Payer: Self-pay | Admitting: Physician Assistant

## 2018-06-28 DIAGNOSIS — G8929 Other chronic pain: Secondary | ICD-10-CM

## 2018-06-28 DIAGNOSIS — M25511 Pain in right shoulder: Principal | ICD-10-CM

## 2018-06-28 MED ORDER — HYDROCODONE-ACETAMINOPHEN 5-325 MG PO TABS: 1.0000 | ORAL_TABLET | Freq: Four times a day (QID) | ORAL | 0 refills | Status: DC | PRN

## 2018-06-28 NOTE — Telephone Encounter (Signed)
Thanks

## 2018-06-28 NOTE — Telephone Encounter (Signed)
Called patient and instructed to come by office and pick up rx. KG LPN

## 2018-06-28 NOTE — Telephone Encounter (Signed)
Pt called. She will run out of her Hydrocodone tomorrow and wants a refill today.

## 2018-06-28 NOTE — Telephone Encounter (Signed)
Must come pick up from front due to electronic prescribing system not working.

## 2018-06-30 ENCOUNTER — Encounter: Payer: Self-pay | Admitting: Physician Assistant

## 2018-06-30 ENCOUNTER — Ambulatory Visit (INDEPENDENT_AMBULATORY_CARE_PROVIDER_SITE_OTHER): Payer: Medicare Other | Admitting: Physician Assistant

## 2018-06-30 VITALS — BP 105/35 | HR 88 | Ht 60.0 in

## 2018-06-30 DIAGNOSIS — E119 Type 2 diabetes mellitus without complications: Secondary | ICD-10-CM | POA: Diagnosis not present

## 2018-06-30 DIAGNOSIS — L03115 Cellulitis of right lower limb: Secondary | ICD-10-CM | POA: Diagnosis not present

## 2018-06-30 DIAGNOSIS — F419 Anxiety disorder, unspecified: Secondary | ICD-10-CM

## 2018-06-30 DIAGNOSIS — R7989 Other specified abnormal findings of blood chemistry: Secondary | ICD-10-CM | POA: Diagnosis not present

## 2018-06-30 LAB — BASIC METABOLIC PANEL WITH GFR
BUN/Creatinine Ratio: 24 (calc) — ABNORMAL HIGH (ref 6–22)
BUN: 37 mg/dL — ABNORMAL HIGH (ref 7–25)
CO2: 25 mmol/L (ref 20–32)
Calcium: 8.6 mg/dL (ref 8.6–10.4)
Chloride: 100 mmol/L (ref 98–110)
Creat: 1.53 mg/dL — ABNORMAL HIGH (ref 0.50–0.99)
GFR, Est African American: 41 mL/min/{1.73_m2} — ABNORMAL LOW (ref >=60)
GFR, Est Non African American: 35 mL/min/{1.73_m2} — ABNORMAL LOW (ref >=60)
Glucose, Bld: 87 mg/dL (ref 65–99)
Potassium: 4.4 mmol/L (ref 3.5–5.3)
Sodium: 135 mmol/L (ref 135–146)

## 2018-06-30 LAB — POCT GLYCOSYLATED HEMOGLOBIN (HGB A1C): Hemoglobin A1C: 6 % — AB (ref 4.0–5.6)

## 2018-06-30 MED ORDER — DOXYCYCLINE HYCLATE 100 MG PO TABS: 100.0000 mg | ORAL_TABLET | Freq: Two times a day (BID) | ORAL | 0 refills | Status: DC

## 2018-06-30 MED ORDER — ALPRAZOLAM 0.5 MG PO TABS: 0.5000 mg | ORAL_TABLET | Freq: Every evening | ORAL | 2 refills | Status: DC | PRN

## 2018-06-30 MED ORDER — CEFPODOXIME PROXETIL 200 MG PO TABS: 400.0000 mg | ORAL_TABLET | Freq: Two times a day (BID) | ORAL | 0 refills | Status: DC

## 2018-06-30 NOTE — Progress Notes (Signed)
u

## 2018-06-30 NOTE — Patient Instructions (Addendum)
Start vantin for 14  days.  Wear unna boot for 3-5 days.    Cellulitis, Adult Cellulitis is a skin infection. The infected area is usually red and sore. This condition occurs most often in the arms and lower legs. It is very important to get treated for this condition. Follow these instructions at home:  Take over-the-counter and prescription medicines only as told by your doctor.  If you were prescribed an antibiotic medicine, take it as told by your doctor. Do not stop taking the antibiotic even if you start to feel better.  Drink enough fluid to keep your pee (urine) clear or pale yellow.  Do not touch or rub the infected area.  Raise (elevate) the infected area above the level of your heart while you are sitting or lying down.  Place warm or cold wet cloths (warm or cold compresses) on the infected area. Do this as told by your doctor.  Keep all follow-up visits as told by your doctor. This is important. These visits let your doctor make sure your infection is not getting worse. Contact a doctor if:  You have a fever.  Your symptoms do not get better after 1-2 days of treatment.  Your bone or joint under the infected area starts to hurt after the skin has healed.  Your infection comes back. This can happen in the same area or another area.  You have a swollen bump in the infected area.  You have new symptoms.  You feel ill and also have muscle aches and pains. Get help right away if:  Your symptoms get worse.  You feel very sleepy.  You throw up (vomit) or have watery poop (diarrhea) for a long time.  There are red streaks coming from the infected area.  Your red area gets larger.  Your red area turns darker. This information is not intended to replace advice given to you by your health care provider. Make sure you discuss any questions you have with your health care provider. Document Released: 05/31/2008 Document Revised: 05/20/2016 Document Reviewed:  10/22/2015 Elsevier Interactive Patient Education  2018 [employer].

## 2018-07-02 DIAGNOSIS — R7989 Other specified abnormal findings of blood chemistry: Secondary | ICD-10-CM | POA: Insufficient documentation

## 2018-07-02 NOTE — Progress Notes (Signed)
Call pt: renal function is about the same as one month ago. Stay away from all NSAIDs. Only take one diuretic choose either lasix of bumex. bumex is a better diuretic. Only take as directed. Make sure drinking enough water. rehceck in 1-2 months.
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
## 2018-07-03 ENCOUNTER — Telehealth: Payer: Self-pay | Admitting: Physician Assistant

## 2018-07-03 DIAGNOSIS — I872 Venous insufficiency (chronic) (peripheral): Secondary | ICD-10-CM

## 2018-07-03 MED ORDER — BUMETANIDE 1 MG PO TABS: 1.0000 mg | ORAL_TABLET | Freq: Two times a day (BID) | ORAL | 1 refills | Status: DC

## 2018-07-03 MED ORDER — PREDNISONE 50 MG PO TABS: ORAL_TABLET | ORAL | 0 refills | Status: DC

## 2018-07-03 NOTE — Telephone Encounter (Signed)
Pt called to request an Rx for prednisone. Pt states the rain is "making her bones hurt." She did keep unna boot on until yesterday, reports the swelling is "a little better." States she has been keeping them propped up.   As per result note, Pt would like to continue the Bumex and discontinue Lasix. Refill requested, pended for Provider approval. Will fax over discount coupon.

## 2018-07-03 NOTE — Telephone Encounter (Signed)
Done. Sent both.

## 2018-07-04 NOTE — Telephone Encounter (Signed)
Pt advised.

## 2018-07-17 ENCOUNTER — Telehealth: Payer: Self-pay | Admitting: Physician Assistant

## 2018-07-17 NOTE — Telephone Encounter (Signed)
Pt advised. She is going to her podiatrist tomorrow, so she will wait and see what they say. If needed,she will call back to schedule injection.
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
## 2018-07-17 NOTE — Telephone Encounter (Signed)
Unfortunately I think prednisone would delay her wound healing at this point considering discussion of the skin grafting in her plastic surgery visits.  I would be happy to give her an injection into the area if this is plantar fasciitis.

## 2018-07-17 NOTE — Telephone Encounter (Signed)
Error

## 2018-07-17 NOTE — Telephone Encounter (Signed)
Pt called. She has bone spur flare up this morning  and wants prednisone to be called in. She has a Doctor's appointment today and wants med call in today.

## 2018-07-24 DIAGNOSIS — K766 Portal hypertension: Secondary | ICD-10-CM | POA: Diagnosis not present

## 2018-07-24 DIAGNOSIS — K7460 Unspecified cirrhosis of liver: Secondary | ICD-10-CM | POA: Diagnosis not present

## 2018-07-24 DIAGNOSIS — I87303 Chronic venous hypertension (idiopathic) without complications of bilateral lower extremity: Secondary | ICD-10-CM | POA: Diagnosis not present

## 2018-07-24 DIAGNOSIS — L97821 Non-pressure chronic ulcer of other part of left lower leg limited to breakdown of skin: Secondary | ICD-10-CM | POA: Diagnosis not present

## 2018-07-24 DIAGNOSIS — I872 Venous insufficiency (chronic) (peripheral): Secondary | ICD-10-CM | POA: Diagnosis not present

## 2018-07-24 DIAGNOSIS — Z5189 Encounter for other specified aftercare: Secondary | ICD-10-CM | POA: Diagnosis not present

## 2018-07-24 DIAGNOSIS — E8770 Fluid overload, unspecified: Secondary | ICD-10-CM | POA: Diagnosis not present

## 2018-07-24 DIAGNOSIS — L97811 Non-pressure chronic ulcer of other part of right lower leg limited to breakdown of skin: Secondary | ICD-10-CM | POA: Diagnosis not present

## 2018-07-24 DIAGNOSIS — M7989 Other specified soft tissue disorders: Secondary | ICD-10-CM | POA: Diagnosis not present

## 2018-07-24 DIAGNOSIS — L03115 Cellulitis of right lower limb: Secondary | ICD-10-CM | POA: Diagnosis not present

## 2018-07-24 DIAGNOSIS — J810 Acute pulmonary edema: Secondary | ICD-10-CM | POA: Diagnosis not present

## 2018-07-24 DIAGNOSIS — R079 Chest pain, unspecified: Secondary | ICD-10-CM | POA: Diagnosis not present

## 2018-07-24 DIAGNOSIS — Z6841 Body Mass Index (BMI) 40.0 and over, adult: Secondary | ICD-10-CM | POA: Diagnosis not present

## 2018-07-24 DIAGNOSIS — L299 Pruritus, unspecified: Secondary | ICD-10-CM | POA: Diagnosis not present

## 2018-07-24 DIAGNOSIS — L03116 Cellulitis of left lower limb: Secondary | ICD-10-CM | POA: Diagnosis not present

## 2018-07-24 DIAGNOSIS — R7881 Bacteremia: Secondary | ICD-10-CM | POA: Diagnosis not present

## 2018-07-24 DIAGNOSIS — K7581 Nonalcoholic steatohepatitis (NASH): Secondary | ICD-10-CM | POA: Diagnosis not present

## 2018-07-24 DIAGNOSIS — R0600 Dyspnea, unspecified: Secondary | ICD-10-CM | POA: Diagnosis not present

## 2018-07-25 DIAGNOSIS — R140 Abdominal distension (gaseous): Secondary | ICD-10-CM | POA: Diagnosis not present

## 2018-07-25 DIAGNOSIS — M25562 Pain in left knee: Secondary | ICD-10-CM | POA: Diagnosis not present

## 2018-07-25 DIAGNOSIS — I878 Other specified disorders of veins: Secondary | ICD-10-CM | POA: Diagnosis not present

## 2018-07-25 DIAGNOSIS — L97919 Non-pressure chronic ulcer of unspecified part of right lower leg with unspecified severity: Secondary | ICD-10-CM | POA: Diagnosis present

## 2018-07-25 DIAGNOSIS — I87303 Chronic venous hypertension (idiopathic) without complications of bilateral lower extremity: Secondary | ICD-10-CM | POA: Diagnosis not present

## 2018-07-25 DIAGNOSIS — R918 Other nonspecific abnormal finding of lung field: Secondary | ICD-10-CM | POA: Diagnosis not present

## 2018-07-25 DIAGNOSIS — K7469 Other cirrhosis of liver: Secondary | ICD-10-CM | POA: Diagnosis present

## 2018-07-25 DIAGNOSIS — R109 Unspecified abdominal pain: Secondary | ICD-10-CM | POA: Diagnosis not present

## 2018-07-25 DIAGNOSIS — E871 Hypo-osmolality and hyponatremia: Secondary | ICD-10-CM | POA: Diagnosis not present

## 2018-07-25 DIAGNOSIS — G8929 Other chronic pain: Secondary | ICD-10-CM | POA: Diagnosis present

## 2018-07-25 DIAGNOSIS — E8770 Fluid overload, unspecified: Secondary | ICD-10-CM | POA: Diagnosis not present

## 2018-07-25 DIAGNOSIS — L538 Other specified erythematous conditions: Secondary | ICD-10-CM | POA: Diagnosis not present

## 2018-07-25 DIAGNOSIS — R7989 Other specified abnormal findings of blood chemistry: Secondary | ICD-10-CM | POA: Diagnosis not present

## 2018-07-25 DIAGNOSIS — E1140 Type 2 diabetes mellitus with diabetic neuropathy, unspecified: Secondary | ICD-10-CM | POA: Diagnosis present

## 2018-07-25 DIAGNOSIS — R1011 Right upper quadrant pain: Secondary | ICD-10-CM | POA: Diagnosis not present

## 2018-07-25 DIAGNOSIS — L539 Erythematous condition, unspecified: Secondary | ICD-10-CM | POA: Diagnosis not present

## 2018-07-25 DIAGNOSIS — E668 Other obesity: Secondary | ICD-10-CM | POA: Diagnosis not present

## 2018-07-25 DIAGNOSIS — R079 Chest pain, unspecified: Secondary | ICD-10-CM | POA: Diagnosis not present

## 2018-07-25 DIAGNOSIS — M25561 Pain in right knee: Secondary | ICD-10-CM | POA: Diagnosis not present

## 2018-07-25 DIAGNOSIS — J449 Chronic obstructive pulmonary disease, unspecified: Secondary | ICD-10-CM | POA: Diagnosis not present

## 2018-07-25 DIAGNOSIS — R601 Generalized edema: Secondary | ICD-10-CM | POA: Diagnosis not present

## 2018-07-25 DIAGNOSIS — L97929 Non-pressure chronic ulcer of unspecified part of left lower leg with unspecified severity: Secondary | ICD-10-CM | POA: Diagnosis present

## 2018-07-25 DIAGNOSIS — Z79891 Long term (current) use of opiate analgesic: Secondary | ICD-10-CM | POA: Diagnosis not present

## 2018-07-25 DIAGNOSIS — E1136 Type 2 diabetes mellitus with diabetic cataract: Secondary | ICD-10-CM | POA: Diagnosis present

## 2018-07-25 DIAGNOSIS — J810 Acute pulmonary edema: Secondary | ICD-10-CM | POA: Diagnosis present

## 2018-07-25 DIAGNOSIS — E11622 Type 2 diabetes mellitus with other skin ulcer: Secondary | ICD-10-CM | POA: Diagnosis not present

## 2018-07-25 DIAGNOSIS — K219 Gastro-esophageal reflux disease without esophagitis: Secondary | ICD-10-CM | POA: Diagnosis present

## 2018-07-25 DIAGNOSIS — E785 Hyperlipidemia, unspecified: Secondary | ICD-10-CM | POA: Diagnosis not present

## 2018-07-25 DIAGNOSIS — Z96651 Presence of right artificial knee joint: Secondary | ICD-10-CM | POA: Diagnosis not present

## 2018-07-25 DIAGNOSIS — K7460 Unspecified cirrhosis of liver: Secondary | ICD-10-CM | POA: Diagnosis not present

## 2018-07-25 DIAGNOSIS — S8992XA Unspecified injury of left lower leg, initial encounter: Secondary | ICD-10-CM | POA: Diagnosis not present

## 2018-07-25 DIAGNOSIS — I890 Lymphedema, not elsewhere classified: Secondary | ICD-10-CM | POA: Diagnosis present

## 2018-07-25 DIAGNOSIS — L97821 Non-pressure chronic ulcer of other part of left lower leg limited to breakdown of skin: Secondary | ICD-10-CM | POA: Diagnosis not present

## 2018-07-25 DIAGNOSIS — R7881 Bacteremia: Secondary | ICD-10-CM | POA: Diagnosis not present

## 2018-07-25 DIAGNOSIS — M79604 Pain in right leg: Secondary | ICD-10-CM | POA: Diagnosis not present

## 2018-07-25 DIAGNOSIS — K766 Portal hypertension: Secondary | ICD-10-CM | POA: Diagnosis present

## 2018-07-25 DIAGNOSIS — L97811 Non-pressure chronic ulcer of other part of right lower leg limited to breakdown of skin: Secondary | ICD-10-CM | POA: Diagnosis not present

## 2018-07-25 DIAGNOSIS — L97211 Non-pressure chronic ulcer of right calf limited to breakdown of skin: Secondary | ICD-10-CM | POA: Diagnosis not present

## 2018-07-25 DIAGNOSIS — L299 Pruritus, unspecified: Secondary | ICD-10-CM | POA: Diagnosis not present

## 2018-07-25 DIAGNOSIS — G4700 Insomnia, unspecified: Secondary | ICD-10-CM | POA: Diagnosis present

## 2018-07-25 DIAGNOSIS — I872 Venous insufficiency (chronic) (peripheral): Secondary | ICD-10-CM | POA: Diagnosis not present

## 2018-07-25 DIAGNOSIS — M7752 Other enthesopathy of left foot: Secondary | ICD-10-CM | POA: Diagnosis not present

## 2018-07-25 DIAGNOSIS — L97221 Non-pressure chronic ulcer of left calf limited to breakdown of skin: Secondary | ICD-10-CM | POA: Diagnosis not present

## 2018-07-25 DIAGNOSIS — Z7984 Long term (current) use of oral hypoglycemic drugs: Secondary | ICD-10-CM | POA: Diagnosis not present

## 2018-07-25 DIAGNOSIS — K5900 Constipation, unspecified: Secondary | ICD-10-CM | POA: Diagnosis not present

## 2018-07-25 DIAGNOSIS — M7989 Other specified soft tissue disorders: Secondary | ICD-10-CM | POA: Diagnosis not present

## 2018-07-25 DIAGNOSIS — Z862 Personal history of diseases of the blood and blood-forming organs and certain disorders involving the immune mechanism: Secondary | ICD-10-CM | POA: Diagnosis not present

## 2018-07-25 DIAGNOSIS — S8991XA Unspecified injury of right lower leg, initial encounter: Secondary | ICD-10-CM | POA: Diagnosis not present

## 2018-07-25 DIAGNOSIS — K7581 Nonalcoholic steatohepatitis (NASH): Secondary | ICD-10-CM | POA: Diagnosis not present

## 2018-07-25 DIAGNOSIS — E1122 Type 2 diabetes mellitus with diabetic chronic kidney disease: Secondary | ICD-10-CM | POA: Diagnosis not present

## 2018-07-25 DIAGNOSIS — L308 Other specified dermatitis: Secondary | ICD-10-CM | POA: Diagnosis not present

## 2018-07-25 DIAGNOSIS — N183 Chronic kidney disease, stage 3 (moderate): Secondary | ICD-10-CM | POA: Diagnosis not present

## 2018-07-25 DIAGNOSIS — R0600 Dyspnea, unspecified: Secondary | ICD-10-CM | POA: Diagnosis not present

## 2018-07-25 DIAGNOSIS — I739 Peripheral vascular disease, unspecified: Secondary | ICD-10-CM | POA: Diagnosis not present

## 2018-07-25 DIAGNOSIS — X58XXXA Exposure to other specified factors, initial encounter: Secondary | ICD-10-CM | POA: Diagnosis not present

## 2018-07-25 DIAGNOSIS — L03115 Cellulitis of right lower limb: Secondary | ICD-10-CM | POA: Diagnosis not present

## 2018-07-25 DIAGNOSIS — Z87898 Personal history of other specified conditions: Secondary | ICD-10-CM | POA: Diagnosis not present

## 2018-07-25 DIAGNOSIS — B9689 Other specified bacterial agents as the cause of diseases classified elsewhere: Secondary | ICD-10-CM | POA: Diagnosis not present

## 2018-07-25 DIAGNOSIS — F418 Other specified anxiety disorders: Secondary | ICD-10-CM | POA: Diagnosis not present

## 2018-07-25 DIAGNOSIS — Z6841 Body Mass Index (BMI) 40.0 and over, adult: Secondary | ICD-10-CM | POA: Diagnosis not present

## 2018-07-25 DIAGNOSIS — L03116 Cellulitis of left lower limb: Secondary | ICD-10-CM | POA: Diagnosis not present

## 2018-07-25 DIAGNOSIS — G4733 Obstructive sleep apnea (adult) (pediatric): Secondary | ICD-10-CM | POA: Diagnosis not present

## 2018-07-25 DIAGNOSIS — M79605 Pain in left leg: Secondary | ICD-10-CM | POA: Diagnosis not present

## 2018-07-25 DIAGNOSIS — Z9989 Dependence on other enabling machines and devices: Secondary | ICD-10-CM | POA: Diagnosis not present

## 2018-07-25 DIAGNOSIS — I129 Hypertensive chronic kidney disease with stage 1 through stage 4 chronic kidney disease, or unspecified chronic kidney disease: Secondary | ICD-10-CM | POA: Diagnosis not present

## 2018-07-25 DIAGNOSIS — L298 Other pruritus: Secondary | ICD-10-CM | POA: Diagnosis not present

## 2018-07-27 ENCOUNTER — Telehealth: Payer: Self-pay

## 2018-07-27 DIAGNOSIS — G8929 Other chronic pain: Secondary | ICD-10-CM

## 2018-07-27 DIAGNOSIS — M25511 Pain in right shoulder: Principal | ICD-10-CM

## 2018-07-27 NOTE — Telephone Encounter (Signed)
Pt called- states she is currently in the hospital and is hoping to get out this weekend- wants to know if she can have a RF on her pain meds   Norco last written 06-28-18

## 2018-07-28 MED ORDER — HYDROCODONE-ACETAMINOPHEN 5-325 MG PO TABS: 1.0000 | ORAL_TABLET | Freq: Four times a day (QID) | ORAL | 0 refills | Status: AC | PRN

## 2018-07-28 MED ORDER — HYDROCODONE-ACETAMINOPHEN 5-325 MG PO TABS: 1.0000 | ORAL_TABLET | Freq: Four times a day (QID) | ORAL | 0 refills | Status: DC | PRN

## 2018-07-28 NOTE — Telephone Encounter (Signed)
Sent to pharmacy 

## 2018-08-03 ENCOUNTER — Telehealth: Payer: Self-pay | Admitting: Family Medicine

## 2018-08-03 MED ORDER — TRIAMCINOLONE ACETONIDE 0.1 % EX CREA
TOPICAL_CREAM | CUTANEOUS | Status: DC
Start: 2018-08-04 — End: 2018-08-03

## 2018-08-03 MED ORDER — SPIRONOLACTONE 25 MG PO TABS
25.00 | ORAL_TABLET | ORAL | Status: DC
Start: 2018-08-04 — End: 2018-08-03

## 2018-08-03 MED ORDER — IRON PO
1.00 | ORAL | Status: DC
Start: 2018-08-04 — End: 2018-08-03

## 2018-08-03 MED ORDER — ALPRAZOLAM 1 MG PO TABS
1.00 | ORAL_TABLET | ORAL | Status: DC
Start: 2018-08-03 — End: 2018-08-03

## 2018-08-03 MED ORDER — DIPHENHYDRAMINE HCL 25 MG PO CAPS
25.00 | ORAL_CAPSULE | ORAL | Status: DC
Start: ? — End: 2018-08-03

## 2018-08-03 MED ORDER — GENERIC EXTERNAL MEDICATION
2.00 | Status: DC
Start: 2018-08-03 — End: 2018-08-03

## 2018-08-03 MED ORDER — CETIRIZINE HCL 10 MG PO TABS
10.00 | ORAL_TABLET | ORAL | Status: DC
Start: 2018-08-04 — End: 2018-08-03

## 2018-08-03 MED ORDER — CALCIUM-VITAMIN D 500-200 MG-UNIT PO TABS
2.00 | ORAL_TABLET | ORAL | Status: DC
Start: 2018-08-04 — End: 2018-08-03

## 2018-08-03 MED ORDER — DEXTROSE 10 % IV SOLN
125.00 | INTRAVENOUS | Status: DC
Start: ? — End: 2018-08-03

## 2018-08-03 MED ORDER — LOSARTAN POTASSIUM 25 MG PO TABS
25.00 | ORAL_TABLET | ORAL | Status: DC
Start: 2018-08-04 — End: 2018-08-03

## 2018-08-03 MED ORDER — HYDROCODONE-ACETAMINOPHEN 5-325 MG PO TABS
1.00 | ORAL_TABLET | ORAL | Status: DC
Start: ? — End: 2018-08-03

## 2018-08-03 MED ORDER — ALBUTEROL SULFATE HFA 108 (90 BASE) MCG/ACT IN AERS
2.00 | INHALATION_SPRAY | RESPIRATORY_TRACT | Status: DC
Start: ? — End: 2018-08-03

## 2018-08-03 MED ORDER — DICLOFENAC SODIUM 1 % TD GEL
2.00 g | TRANSDERMAL | Status: DC
Start: 2018-08-03 — End: 2018-08-03

## 2018-08-03 MED ORDER — DULOXETINE HCL 60 MG PO CPEP
60.00 | ORAL_CAPSULE | ORAL | Status: DC
Start: 2018-08-04 — End: 2018-08-03

## 2018-08-03 MED ORDER — INSULIN LISPRO 100 UNIT/ML ~~LOC~~ SOLN
2.00 | SUBCUTANEOUS | Status: DC
Start: 2018-08-03 — End: 2018-08-03

## 2018-08-03 MED ORDER — SENNOSIDES-DOCUSATE SODIUM 8.6-50 MG PO TABS
1.00 | ORAL_TABLET | ORAL | Status: DC
Start: 2018-08-03 — End: 2018-08-03

## 2018-08-03 MED ORDER — CITALOPRAM HYDROBROMIDE 20 MG PO TABS
40.00 | ORAL_TABLET | ORAL | Status: DC
Start: 2018-08-04 — End: 2018-08-03

## 2018-08-03 MED ORDER — POLYETHYLENE GLYCOL 3350 17 G PO PACK
17.00 g | PACK | ORAL | Status: DC
Start: ? — End: 2018-08-03

## 2018-08-03 MED ORDER — ATORVASTATIN CALCIUM 40 MG PO TABS
40.00 | ORAL_TABLET | ORAL | Status: DC
Start: 2018-08-03 — End: 2018-08-03

## 2018-08-03 MED ORDER — LIDOCAINE 5 % EX PTCH
2.00 | MEDICATED_PATCH | CUTANEOUS | Status: DC
Start: 2018-08-03 — End: 2018-08-03

## 2018-08-03 MED ORDER — ENOXAPARIN SODIUM 40 MG/0.4ML ~~LOC~~ SOLN
40.00 | SUBCUTANEOUS | Status: DC
Start: 2018-08-03 — End: 2018-08-03

## 2018-08-03 MED ORDER — CLOTRIMAZOLE 1 % EX CREA
TOPICAL_CREAM | CUTANEOUS | Status: DC
Start: 2018-08-03 — End: 2018-08-03

## 2018-08-03 MED ORDER — EUCERIN EX CREA
TOPICAL_CREAM | CUTANEOUS | Status: DC
Start: ? — End: 2018-08-03

## 2018-08-03 NOTE — Telephone Encounter (Signed)
Left VM with recommendation  

## 2018-08-03 NOTE — Telephone Encounter (Signed)
Pt called clinic requesting Rx for 5 day Prednisone Rx, states this helps when her feet are swollen and hurts to walk. She is currently inpatient at [facility]. She reports they are refusing to give her Prednisone because it will cause her sugars to rise, they are currently treating her DM with insulin while inpatient. I advised she should follow their direction regarding no prednisone, btu she requesting I send a message to see if one of our Providers would "just send it in."  Will route. If you see in chart, Pt calls monthly for 5 day Prednisone dose packs.

## 2018-08-03 NOTE — Telephone Encounter (Signed)
I will not call any prednisone especially while she is hospitalized.  I agree if they are having difficulty controlling her sugars and do not want her on prednisone that they probably have a good reason for why.

## 2018-08-08 DIAGNOSIS — N183 Chronic kidney disease, stage 3 (moderate): Secondary | ICD-10-CM | POA: Diagnosis not present

## 2018-08-08 DIAGNOSIS — K219 Gastro-esophageal reflux disease without esophagitis: Secondary | ICD-10-CM | POA: Diagnosis not present

## 2018-08-08 DIAGNOSIS — E1142 Type 2 diabetes mellitus with diabetic polyneuropathy: Secondary | ICD-10-CM | POA: Diagnosis not present

## 2018-08-08 DIAGNOSIS — Z7984 Long term (current) use of oral hypoglycemic drugs: Secondary | ICD-10-CM | POA: Diagnosis not present

## 2018-08-08 DIAGNOSIS — E785 Hyperlipidemia, unspecified: Secondary | ICD-10-CM | POA: Diagnosis not present

## 2018-08-08 DIAGNOSIS — E1136 Type 2 diabetes mellitus with diabetic cataract: Secondary | ICD-10-CM | POA: Diagnosis not present

## 2018-08-08 DIAGNOSIS — J449 Chronic obstructive pulmonary disease, unspecified: Secondary | ICD-10-CM | POA: Diagnosis not present

## 2018-08-08 DIAGNOSIS — I872 Venous insufficiency (chronic) (peripheral): Secondary | ICD-10-CM | POA: Diagnosis not present

## 2018-08-08 DIAGNOSIS — E1122 Type 2 diabetes mellitus with diabetic chronic kidney disease: Secondary | ICD-10-CM | POA: Diagnosis not present

## 2018-08-08 DIAGNOSIS — L97821 Non-pressure chronic ulcer of other part of left lower leg limited to breakdown of skin: Secondary | ICD-10-CM | POA: Diagnosis not present

## 2018-08-08 DIAGNOSIS — G8929 Other chronic pain: Secondary | ICD-10-CM | POA: Diagnosis not present

## 2018-08-08 DIAGNOSIS — E1151 Type 2 diabetes mellitus with diabetic peripheral angiopathy without gangrene: Secondary | ICD-10-CM | POA: Diagnosis not present

## 2018-08-08 DIAGNOSIS — D509 Iron deficiency anemia, unspecified: Secondary | ICD-10-CM | POA: Diagnosis not present

## 2018-08-08 DIAGNOSIS — F419 Anxiety disorder, unspecified: Secondary | ICD-10-CM | POA: Diagnosis not present

## 2018-08-08 DIAGNOSIS — I129 Hypertensive chronic kidney disease with stage 1 through stage 4 chronic kidney disease, or unspecified chronic kidney disease: Secondary | ICD-10-CM | POA: Diagnosis not present

## 2018-08-08 DIAGNOSIS — K7581 Nonalcoholic steatohepatitis (NASH): Secondary | ICD-10-CM | POA: Diagnosis not present

## 2018-08-08 DIAGNOSIS — G4733 Obstructive sleep apnea (adult) (pediatric): Secondary | ICD-10-CM | POA: Diagnosis not present

## 2018-08-08 DIAGNOSIS — T2101XD Burn of unspecified degree of chest wall, subsequent encounter: Secondary | ICD-10-CM | POA: Diagnosis not present

## 2018-08-09 DIAGNOSIS — K7460 Unspecified cirrhosis of liver: Secondary | ICD-10-CM | POA: Diagnosis not present

## 2018-08-09 DIAGNOSIS — L97808 Non-pressure chronic ulcer of other part of unspecified lower leg with other specified severity: Secondary | ICD-10-CM | POA: Diagnosis not present

## 2018-08-09 DIAGNOSIS — E1169 Type 2 diabetes mellitus with other specified complication: Secondary | ICD-10-CM | POA: Diagnosis not present

## 2018-08-09 DIAGNOSIS — I83008 Varicose veins of unspecified lower extremity with ulcer other part of lower leg: Secondary | ICD-10-CM | POA: Diagnosis not present

## 2018-08-09 DIAGNOSIS — J449 Chronic obstructive pulmonary disease, unspecified: Secondary | ICD-10-CM | POA: Diagnosis not present

## 2018-08-09 DIAGNOSIS — K7581 Nonalcoholic steatohepatitis (NASH): Secondary | ICD-10-CM | POA: Diagnosis not present

## 2018-08-09 MED ORDER — PREDNISONE 50 MG PO TABS: ORAL_TABLET | ORAL | 0 refills | Status: DC

## 2018-08-09 NOTE — Telephone Encounter (Signed)
sent 

## 2018-08-09 NOTE — Telephone Encounter (Signed)
Pt called clinic today requesting Prednisone Rx. States she is home now, but can't see the podiatrist for 2 weeks. Will route.

## 2018-08-09 NOTE — Addendum Note (Signed)
Addended by: Donella Stade on: 08/09/2018 11:33 AM   Modules accepted: Orders

## 2018-08-10 NOTE — Telephone Encounter (Signed)
Pt advised.

## 2018-08-11 DIAGNOSIS — J449 Chronic obstructive pulmonary disease, unspecified: Secondary | ICD-10-CM | POA: Diagnosis not present

## 2018-08-11 DIAGNOSIS — I129 Hypertensive chronic kidney disease with stage 1 through stage 4 chronic kidney disease, or unspecified chronic kidney disease: Secondary | ICD-10-CM | POA: Diagnosis not present

## 2018-08-11 DIAGNOSIS — E1151 Type 2 diabetes mellitus with diabetic peripheral angiopathy without gangrene: Secondary | ICD-10-CM | POA: Diagnosis not present

## 2018-08-11 DIAGNOSIS — I872 Venous insufficiency (chronic) (peripheral): Secondary | ICD-10-CM | POA: Diagnosis not present

## 2018-08-11 DIAGNOSIS — K7581 Nonalcoholic steatohepatitis (NASH): Secondary | ICD-10-CM | POA: Diagnosis not present

## 2018-08-11 DIAGNOSIS — L97821 Non-pressure chronic ulcer of other part of left lower leg limited to breakdown of skin: Secondary | ICD-10-CM | POA: Diagnosis not present

## 2018-08-15 DIAGNOSIS — L97821 Non-pressure chronic ulcer of other part of left lower leg limited to breakdown of skin: Secondary | ICD-10-CM | POA: Diagnosis not present

## 2018-08-15 DIAGNOSIS — E1151 Type 2 diabetes mellitus with diabetic peripheral angiopathy without gangrene: Secondary | ICD-10-CM | POA: Diagnosis not present

## 2018-08-15 DIAGNOSIS — I872 Venous insufficiency (chronic) (peripheral): Secondary | ICD-10-CM | POA: Diagnosis not present

## 2018-08-15 DIAGNOSIS — J449 Chronic obstructive pulmonary disease, unspecified: Secondary | ICD-10-CM | POA: Diagnosis not present

## 2018-08-15 DIAGNOSIS — I129 Hypertensive chronic kidney disease with stage 1 through stage 4 chronic kidney disease, or unspecified chronic kidney disease: Secondary | ICD-10-CM | POA: Diagnosis not present

## 2018-08-15 DIAGNOSIS — K7581 Nonalcoholic steatohepatitis (NASH): Secondary | ICD-10-CM | POA: Diagnosis not present

## 2018-08-16 ENCOUNTER — Telehealth: Payer: Self-pay | Admitting: Physician Assistant

## 2018-08-16 NOTE — Telephone Encounter (Signed)
PT requested for "omnicef" to be sent to her pharmacy due to a rash on her leg.  Please advise.

## 2018-08-16 NOTE — Telephone Encounter (Signed)
Patient called this morning and states she spoke with Myriam Jacobson and her home health nurse was there in home when called due to rash on lower left leg wound to prevent infection or cellulitis. Please advise

## 2018-08-17 DIAGNOSIS — J449 Chronic obstructive pulmonary disease, unspecified: Secondary | ICD-10-CM | POA: Diagnosis not present

## 2018-08-17 DIAGNOSIS — L97821 Non-pressure chronic ulcer of other part of left lower leg limited to breakdown of skin: Secondary | ICD-10-CM | POA: Diagnosis not present

## 2018-08-17 DIAGNOSIS — I872 Venous insufficiency (chronic) (peripheral): Secondary | ICD-10-CM | POA: Diagnosis not present

## 2018-08-17 DIAGNOSIS — I129 Hypertensive chronic kidney disease with stage 1 through stage 4 chronic kidney disease, or unspecified chronic kidney disease: Secondary | ICD-10-CM | POA: Diagnosis not present

## 2018-08-17 DIAGNOSIS — E1151 Type 2 diabetes mellitus with diabetic peripheral angiopathy without gangrene: Secondary | ICD-10-CM | POA: Diagnosis not present

## 2018-08-17 DIAGNOSIS — K7581 Nonalcoholic steatohepatitis (NASH): Secondary | ICD-10-CM | POA: Diagnosis not present

## 2018-08-17 NOTE — Telephone Encounter (Signed)
Called patient and advised really needs to make appointment with [facility] for her to evaluate her left leg. Patient agreed and sent to schedule office visit with [facility]. KG LPN

## 2018-08-17 NOTE — Telephone Encounter (Addendum)
She really needs appt for u so took at this. Does she have a hospital F/U scheduled for next week?

## 2018-08-21 ENCOUNTER — Ambulatory Visit: Payer: Medicare Other | Admitting: Physician Assistant

## 2018-08-22 DIAGNOSIS — M7670 Peroneal tendinitis, unspecified leg: Secondary | ICD-10-CM | POA: Diagnosis not present

## 2018-08-23 DIAGNOSIS — K7581 Nonalcoholic steatohepatitis (NASH): Secondary | ICD-10-CM | POA: Diagnosis not present

## 2018-08-23 DIAGNOSIS — J449 Chronic obstructive pulmonary disease, unspecified: Secondary | ICD-10-CM | POA: Diagnosis not present

## 2018-08-23 DIAGNOSIS — L97821 Non-pressure chronic ulcer of other part of left lower leg limited to breakdown of skin: Secondary | ICD-10-CM | POA: Diagnosis not present

## 2018-08-23 DIAGNOSIS — I872 Venous insufficiency (chronic) (peripheral): Secondary | ICD-10-CM | POA: Diagnosis not present

## 2018-08-23 DIAGNOSIS — I129 Hypertensive chronic kidney disease with stage 1 through stage 4 chronic kidney disease, or unspecified chronic kidney disease: Secondary | ICD-10-CM | POA: Diagnosis not present

## 2018-08-23 DIAGNOSIS — E1151 Type 2 diabetes mellitus with diabetic peripheral angiopathy without gangrene: Secondary | ICD-10-CM | POA: Diagnosis not present

## 2018-08-25 DIAGNOSIS — I129 Hypertensive chronic kidney disease with stage 1 through stage 4 chronic kidney disease, or unspecified chronic kidney disease: Secondary | ICD-10-CM | POA: Diagnosis not present

## 2018-08-25 DIAGNOSIS — J449 Chronic obstructive pulmonary disease, unspecified: Secondary | ICD-10-CM | POA: Diagnosis not present

## 2018-08-25 DIAGNOSIS — L97821 Non-pressure chronic ulcer of other part of left lower leg limited to breakdown of skin: Secondary | ICD-10-CM | POA: Diagnosis not present

## 2018-08-25 DIAGNOSIS — E1151 Type 2 diabetes mellitus with diabetic peripheral angiopathy without gangrene: Secondary | ICD-10-CM | POA: Diagnosis not present

## 2018-08-25 DIAGNOSIS — I872 Venous insufficiency (chronic) (peripheral): Secondary | ICD-10-CM | POA: Diagnosis not present

## 2018-08-25 DIAGNOSIS — K7581 Nonalcoholic steatohepatitis (NASH): Secondary | ICD-10-CM | POA: Diagnosis not present

## 2018-08-29 ENCOUNTER — Telehealth: Payer: Self-pay

## 2018-08-29 ENCOUNTER — Ambulatory Visit: Payer: Medicare Other | Admitting: Physician Assistant

## 2018-08-29 NOTE — Telephone Encounter (Signed)
Sudi, RN with [facility] called and states [name] refused physical therapy.

## 2018-08-31 ENCOUNTER — Telehealth: Payer: Self-pay | Admitting: Physician Assistant

## 2018-08-31 DIAGNOSIS — M25511 Pain in right shoulder: Principal | ICD-10-CM

## 2018-08-31 DIAGNOSIS — K7581 Nonalcoholic steatohepatitis (NASH): Secondary | ICD-10-CM | POA: Diagnosis not present

## 2018-08-31 DIAGNOSIS — J449 Chronic obstructive pulmonary disease, unspecified: Secondary | ICD-10-CM | POA: Diagnosis not present

## 2018-08-31 DIAGNOSIS — L97821 Non-pressure chronic ulcer of other part of left lower leg limited to breakdown of skin: Secondary | ICD-10-CM | POA: Diagnosis not present

## 2018-08-31 DIAGNOSIS — I872 Venous insufficiency (chronic) (peripheral): Secondary | ICD-10-CM | POA: Diagnosis not present

## 2018-08-31 DIAGNOSIS — G8929 Other chronic pain: Secondary | ICD-10-CM

## 2018-08-31 DIAGNOSIS — E1151 Type 2 diabetes mellitus with diabetic peripheral angiopathy without gangrene: Secondary | ICD-10-CM | POA: Diagnosis not present

## 2018-08-31 DIAGNOSIS — I129 Hypertensive chronic kidney disease with stage 1 through stage 4 chronic kidney disease, or unspecified chronic kidney disease: Secondary | ICD-10-CM | POA: Diagnosis not present

## 2018-08-31 NOTE — Telephone Encounter (Signed)
Pt called and states that her pain medicine runs out 09/01/18 and would like for it to be called into her pharmacy so she will not be without and her appt is on 09/04/18 Monday with [facility]

## 2018-08-31 NOTE — Telephone Encounter (Signed)
PCP can review tomorrow when she is back in office. Routing.

## 2018-09-01 DIAGNOSIS — D500 Iron deficiency anemia secondary to blood loss (chronic): Secondary | ICD-10-CM | POA: Diagnosis not present

## 2018-09-01 DIAGNOSIS — N183 Chronic kidney disease, stage 3 (moderate): Secondary | ICD-10-CM | POA: Diagnosis not present

## 2018-09-01 DIAGNOSIS — E538 Deficiency of other specified B group vitamins: Secondary | ICD-10-CM | POA: Diagnosis not present

## 2018-09-01 MED ORDER — HYDROCODONE-ACETAMINOPHEN 5-325 MG PO TABS: 1.0000 | ORAL_TABLET | Freq: Three times a day (TID) | ORAL | 0 refills | Status: DC | PRN

## 2018-09-01 NOTE — Telephone Encounter (Signed)
Done

## 2018-09-01 NOTE — Telephone Encounter (Signed)
Pt advised.

## 2018-09-04 ENCOUNTER — Ambulatory Visit: Payer: Medicare Other | Admitting: Physician Assistant

## 2018-09-07 ENCOUNTER — Other Ambulatory Visit: Payer: Self-pay

## 2018-09-07 ENCOUNTER — Telehealth: Payer: Self-pay

## 2018-09-07 NOTE — Telephone Encounter (Signed)
[name] called this morning concerning [name]. They wanted to let you know that [name] is currently going through a difficult situation as her rent on her home has gone up a lot. They wanted to see if we could possibly send out a social worker to help her with everything? Please advise.

## 2018-09-07 NOTE — Telephone Encounter (Signed)
Ok to place order for this.

## 2018-09-11 ENCOUNTER — Ambulatory Visit: Payer: Medicare Other | Admitting: Physician Assistant

## 2018-09-11 ENCOUNTER — Ambulatory Visit (INDEPENDENT_AMBULATORY_CARE_PROVIDER_SITE_OTHER): Payer: Medicare Other | Admitting: Physician Assistant

## 2018-09-11 ENCOUNTER — Encounter: Payer: Self-pay | Admitting: Physician Assistant

## 2018-09-11 VITALS — BP 108/40 | HR 86 | Ht 60.0 in | Wt 292.0 lb

## 2018-09-11 DIAGNOSIS — L03115 Cellulitis of right lower limb: Secondary | ICD-10-CM | POA: Diagnosis not present

## 2018-09-11 DIAGNOSIS — L03116 Cellulitis of left lower limb: Secondary | ICD-10-CM

## 2018-09-11 DIAGNOSIS — E871 Hypo-osmolality and hyponatremia: Secondary | ICD-10-CM | POA: Diagnosis not present

## 2018-09-11 DIAGNOSIS — M25511 Pain in right shoulder: Secondary | ICD-10-CM | POA: Diagnosis not present

## 2018-09-11 DIAGNOSIS — F331 Major depressive disorder, recurrent, moderate: Secondary | ICD-10-CM

## 2018-09-11 DIAGNOSIS — M25561 Pain in right knee: Secondary | ICD-10-CM

## 2018-09-11 DIAGNOSIS — E119 Type 2 diabetes mellitus without complications: Secondary | ICD-10-CM

## 2018-09-11 DIAGNOSIS — G8929 Other chronic pain: Secondary | ICD-10-CM | POA: Diagnosis not present

## 2018-09-11 DIAGNOSIS — M25562 Pain in left knee: Secondary | ICD-10-CM

## 2018-09-11 LAB — POCT GLYCOSYLATED HEMOGLOBIN (HGB A1C): Hemoglobin A1C: 6.4 % — AB (ref 4.0–5.6)

## 2018-09-11 MED ORDER — HYDROCODONE-ACETAMINOPHEN 5-325 MG PO TABS: 1.0000 | ORAL_TABLET | Freq: Three times a day (TID) | ORAL | 0 refills | Status: DC | PRN

## 2018-09-11 MED ORDER — PREDNISONE 50 MG PO TABS: ORAL_TABLET | ORAL | 0 refills | Status: DC
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
## 2018-09-12 ENCOUNTER — Telehealth: Payer: Self-pay

## 2018-09-12 NOTE — Telephone Encounter (Signed)
Verbal OK given

## 2018-09-12 NOTE — Telephone Encounter (Signed)
[name] from [facility] called to request orders for a social worker for pt.  Pt's rent has increased and pt is no longer able to afford to live there, having difficulties trying to find somewhere to live.

## 2018-09-12 NOTE — Telephone Encounter (Signed)
I got this msg last week as well. I ok'd the referral.

## 2018-09-13 DIAGNOSIS — K7581 Nonalcoholic steatohepatitis (NASH): Secondary | ICD-10-CM | POA: Diagnosis not present

## 2018-09-13 DIAGNOSIS — L97821 Non-pressure chronic ulcer of other part of left lower leg limited to breakdown of skin: Secondary | ICD-10-CM | POA: Diagnosis not present

## 2018-09-13 DIAGNOSIS — I129 Hypertensive chronic kidney disease with stage 1 through stage 4 chronic kidney disease, or unspecified chronic kidney disease: Secondary | ICD-10-CM | POA: Diagnosis not present

## 2018-09-13 DIAGNOSIS — E1151 Type 2 diabetes mellitus with diabetic peripheral angiopathy without gangrene: Secondary | ICD-10-CM | POA: Diagnosis not present

## 2018-09-13 DIAGNOSIS — I872 Venous insufficiency (chronic) (peripheral): Secondary | ICD-10-CM | POA: Diagnosis not present

## 2018-09-13 DIAGNOSIS — J449 Chronic obstructive pulmonary disease, unspecified: Secondary | ICD-10-CM | POA: Diagnosis not present

## 2018-09-14 ENCOUNTER — Ambulatory Visit: Payer: Medicare Other

## 2018-09-14 DIAGNOSIS — E538 Deficiency of other specified B group vitamins: Secondary | ICD-10-CM | POA: Diagnosis not present

## 2018-09-14 DIAGNOSIS — D500 Iron deficiency anemia secondary to blood loss (chronic): Secondary | ICD-10-CM | POA: Diagnosis not present

## 2018-09-14 DIAGNOSIS — N183 Chronic kidney disease, stage 3 (moderate): Secondary | ICD-10-CM | POA: Diagnosis not present

## 2018-09-15 ENCOUNTER — Encounter: Payer: Self-pay | Admitting: Physician Assistant

## 2018-09-15 DIAGNOSIS — E8770 Fluid overload, unspecified: Secondary | ICD-10-CM | POA: Diagnosis not present

## 2018-09-15 DIAGNOSIS — K7460 Unspecified cirrhosis of liver: Secondary | ICD-10-CM | POA: Diagnosis not present

## 2018-09-15 DIAGNOSIS — E871 Hypo-osmolality and hyponatremia: Secondary | ICD-10-CM | POA: Insufficient documentation

## 2018-09-15 DIAGNOSIS — K7581 Nonalcoholic steatohepatitis (NASH): Secondary | ICD-10-CM | POA: Diagnosis not present

## 2018-09-15 DIAGNOSIS — R1900 Intra-abdominal and pelvic swelling, mass and lump, unspecified site: Secondary | ICD-10-CM | POA: Diagnosis not present

## 2018-09-15 DIAGNOSIS — Z6841 Body Mass Index (BMI) 40.0 and over, adult: Secondary | ICD-10-CM | POA: Diagnosis not present

## 2018-09-15 DIAGNOSIS — Z23 Encounter for immunization: Secondary | ICD-10-CM | POA: Diagnosis not present

## 2018-09-15 DIAGNOSIS — I872 Venous insufficiency (chronic) (peripheral): Secondary | ICD-10-CM | POA: Diagnosis not present

## 2018-09-15 DIAGNOSIS — D126 Benign neoplasm of colon, unspecified: Secondary | ICD-10-CM | POA: Diagnosis not present

## 2018-09-19 DIAGNOSIS — L97821 Non-pressure chronic ulcer of other part of left lower leg limited to breakdown of skin: Secondary | ICD-10-CM | POA: Diagnosis not present

## 2018-09-19 DIAGNOSIS — I129 Hypertensive chronic kidney disease with stage 1 through stage 4 chronic kidney disease, or unspecified chronic kidney disease: Secondary | ICD-10-CM | POA: Diagnosis not present

## 2018-09-19 DIAGNOSIS — E1151 Type 2 diabetes mellitus with diabetic peripheral angiopathy without gangrene: Secondary | ICD-10-CM | POA: Diagnosis not present

## 2018-09-19 DIAGNOSIS — I872 Venous insufficiency (chronic) (peripheral): Secondary | ICD-10-CM | POA: Diagnosis not present

## 2018-09-19 DIAGNOSIS — J449 Chronic obstructive pulmonary disease, unspecified: Secondary | ICD-10-CM | POA: Diagnosis not present

## 2018-09-19 DIAGNOSIS — K7581 Nonalcoholic steatohepatitis (NASH): Secondary | ICD-10-CM | POA: Diagnosis not present

## 2018-09-21 DIAGNOSIS — N183 Chronic kidney disease, stage 3 (moderate): Secondary | ICD-10-CM | POA: Diagnosis not present

## 2018-09-21 DIAGNOSIS — E538 Deficiency of other specified B group vitamins: Secondary | ICD-10-CM | POA: Diagnosis not present

## 2018-09-21 DIAGNOSIS — D500 Iron deficiency anemia secondary to blood loss (chronic): Secondary | ICD-10-CM | POA: Diagnosis not present

## 2018-09-22 DIAGNOSIS — I129 Hypertensive chronic kidney disease with stage 1 through stage 4 chronic kidney disease, or unspecified chronic kidney disease: Secondary | ICD-10-CM | POA: Diagnosis not present

## 2018-09-22 DIAGNOSIS — K7581 Nonalcoholic steatohepatitis (NASH): Secondary | ICD-10-CM | POA: Diagnosis not present

## 2018-09-22 DIAGNOSIS — L97821 Non-pressure chronic ulcer of other part of left lower leg limited to breakdown of skin: Secondary | ICD-10-CM | POA: Diagnosis not present

## 2018-09-22 DIAGNOSIS — J449 Chronic obstructive pulmonary disease, unspecified: Secondary | ICD-10-CM | POA: Diagnosis not present

## 2018-09-22 DIAGNOSIS — I872 Venous insufficiency (chronic) (peripheral): Secondary | ICD-10-CM | POA: Diagnosis not present

## 2018-09-22 DIAGNOSIS — E1151 Type 2 diabetes mellitus with diabetic peripheral angiopathy without gangrene: Secondary | ICD-10-CM | POA: Diagnosis not present

## 2018-09-26 ENCOUNTER — Telehealth: Payer: Self-pay | Admitting: Physician Assistant

## 2018-09-26 ENCOUNTER — Other Ambulatory Visit: Payer: Self-pay | Admitting: Physician Assistant

## 2018-09-26 ENCOUNTER — Other Ambulatory Visit: Payer: Self-pay

## 2018-09-26 DIAGNOSIS — F419 Anxiety disorder, unspecified: Secondary | ICD-10-CM

## 2018-09-26 DIAGNOSIS — I872 Venous insufficiency (chronic) (peripheral): Secondary | ICD-10-CM

## 2018-09-26 DIAGNOSIS — J42 Unspecified chronic bronchitis: Secondary | ICD-10-CM

## 2018-09-26 DIAGNOSIS — E119 Type 2 diabetes mellitus without complications: Secondary | ICD-10-CM

## 2018-09-26 DIAGNOSIS — I700 Atherosclerosis of aorta: Secondary | ICD-10-CM

## 2018-09-26 DIAGNOSIS — F331 Major depressive disorder, recurrent, moderate: Secondary | ICD-10-CM

## 2018-09-26 DIAGNOSIS — I10 Essential (primary) hypertension: Secondary | ICD-10-CM

## 2018-09-26 MED ORDER — GLUCOSE BLOOD VI STRP: ORAL_STRIP | 99 refills | Status: DC

## 2018-09-26 MED ORDER — GLIPIZIDE 5 MG PO TABS: ORAL_TABLET | ORAL | 1 refills | Status: DC

## 2018-09-26 MED ORDER — ATORVASTATIN CALCIUM 40 MG PO TABS: 40.0000 mg | ORAL_TABLET | Freq: Every day | ORAL | 1 refills | Status: DC

## 2018-09-26 MED ORDER — CITALOPRAM HYDROBROMIDE 40 MG PO TABS: 40.0000 mg | ORAL_TABLET | Freq: Every day | ORAL | 1 refills | Status: DC

## 2018-09-26 MED ORDER — SPIRONOLACTONE 25 MG PO TABS: 25.0000 mg | ORAL_TABLET | Freq: Every day | ORAL | 1 refills | Status: DC

## 2018-09-26 MED ORDER — BUMETANIDE 1 MG PO TABS: 1.0000 mg | ORAL_TABLET | Freq: Two times a day (BID) | ORAL | 1 refills | Status: DC

## 2018-09-26 MED ORDER — ONETOUCH ULTRASOFT LANCETS MISC: 5 refills | Status: AC

## 2018-09-26 MED ORDER — BUDESONIDE-FORMOTEROL FUMARATE 160-4.5 MCG/ACT IN AERO
2.0000 | INHALATION_SPRAY | Freq: Two times a day (BID) | RESPIRATORY_TRACT | 3 refills | Status: DC
Start: 2018-09-26 — End: 2018-09-26

## 2018-09-26 MED ORDER — BUDESONIDE-FORMOTEROL FUMARATE 160-4.5 MCG/ACT IN AERO: 2.0000 | INHALATION_SPRAY | Freq: Two times a day (BID) | RESPIRATORY_TRACT | 3 refills | Status: DC

## 2018-09-26 MED ORDER — ALPRAZOLAM 0.5 MG PO TABS: 0.5000 mg | ORAL_TABLET | Freq: Every evening | ORAL | 2 refills | Status: DC | PRN

## 2018-09-26 MED ORDER — DULOXETINE HCL 60 MG PO CPEP: ORAL_CAPSULE | ORAL | 1 refills | Status: DC

## 2018-09-26 MED ORDER — LOSARTAN POTASSIUM 25 MG PO TABS: 25.0000 mg | ORAL_TABLET | Freq: Every day | ORAL | 1 refills | Status: DC

## 2018-09-26 MED ORDER — GLUCOSE BLOOD VI STRP: ORAL_STRIP | 5 refills | Status: DC

## 2018-09-26 MED ORDER — METFORMIN HCL 1000 MG PO TABS: 1000.0000 mg | ORAL_TABLET | Freq: Two times a day (BID) | ORAL | 2 refills | Status: DC

## 2018-09-26 MED ORDER — ONETOUCH ULTRASOFT LANCETS MISC: 99 refills | Status: DC

## 2018-09-26 NOTE — Telephone Encounter (Signed)
[name] requests a refill on Xanax.

## 2018-09-26 NOTE — Telephone Encounter (Signed)
Pt wanted long term Rx's sent to mail order. Directions changed for lancets and strips to not have PRN in the refill slot. No further concerns.

## 2018-10-12 DIAGNOSIS — R918 Other nonspecific abnormal finding of lung field: Secondary | ICD-10-CM | POA: Diagnosis not present

## 2018-10-12 DIAGNOSIS — R609 Edema, unspecified: Secondary | ICD-10-CM | POA: Diagnosis not present

## 2018-10-12 DIAGNOSIS — I517 Cardiomegaly: Secondary | ICD-10-CM | POA: Diagnosis not present

## 2018-10-12 DIAGNOSIS — I878 Other specified disorders of veins: Secondary | ICD-10-CM | POA: Diagnosis not present

## 2018-10-12 DIAGNOSIS — E119 Type 2 diabetes mellitus without complications: Secondary | ICD-10-CM | POA: Diagnosis not present

## 2018-10-13 DIAGNOSIS — R918 Other nonspecific abnormal finding of lung field: Secondary | ICD-10-CM | POA: Diagnosis not present

## 2018-10-13 DIAGNOSIS — I517 Cardiomegaly: Secondary | ICD-10-CM | POA: Diagnosis not present

## 2018-10-17 ENCOUNTER — Telehealth: Payer: Self-pay | Admitting: Physician Assistant

## 2018-10-17 DIAGNOSIS — M25562 Pain in left knee: Principal | ICD-10-CM

## 2018-10-17 DIAGNOSIS — G8929 Other chronic pain: Secondary | ICD-10-CM

## 2018-10-17 DIAGNOSIS — M25561 Pain in right knee: Principal | ICD-10-CM

## 2018-10-17 DIAGNOSIS — M25511 Pain in right shoulder: Secondary | ICD-10-CM

## 2018-10-17 MED ORDER — HYDROCODONE-ACETAMINOPHEN 5-325 MG PO TABS: 1.0000 | ORAL_TABLET | Freq: Three times a day (TID) | ORAL | 0 refills | Status: DC | PRN

## 2018-10-17 MED ORDER — PREDNISONE 50 MG PO TABS: ORAL_TABLET | ORAL | 0 refills | Status: DC

## 2018-10-17 NOTE — Telephone Encounter (Signed)
Routing to PCP for review.

## 2018-10-17 NOTE — Telephone Encounter (Signed)
Sent!

## 2018-10-17 NOTE — Telephone Encounter (Signed)
Pt called. She is wanting refill on her Prednisone and pain medicine... had to take it during the day as well due to pain in leg and hip. She was seen at [facility] for her leg pain.  She has an appointment scheduled with you on October 28th. Thanks

## 2018-10-17 NOTE — Telephone Encounter (Signed)
Pt.notified

## 2018-10-23 ENCOUNTER — Ambulatory Visit: Payer: Medicare Other | Admitting: Physician Assistant

## 2018-10-24 ENCOUNTER — Ambulatory Visit (INDEPENDENT_AMBULATORY_CARE_PROVIDER_SITE_OTHER): Payer: Medicare Other | Admitting: Physician Assistant

## 2018-10-24 ENCOUNTER — Encounter: Payer: Self-pay | Admitting: Physician Assistant

## 2018-10-24 VITALS — BP 136/62 | HR 103 | Temp 98.5°F | Wt 301.0 lb

## 2018-10-24 DIAGNOSIS — F419 Anxiety disorder, unspecified: Secondary | ICD-10-CM

## 2018-10-24 DIAGNOSIS — E119 Type 2 diabetes mellitus without complications: Secondary | ICD-10-CM | POA: Diagnosis not present

## 2018-10-24 DIAGNOSIS — G8929 Other chronic pain: Secondary | ICD-10-CM | POA: Diagnosis not present

## 2018-10-24 DIAGNOSIS — Z79899 Other long term (current) drug therapy: Secondary | ICD-10-CM | POA: Diagnosis not present

## 2018-10-24 DIAGNOSIS — M25562 Pain in left knee: Secondary | ICD-10-CM | POA: Diagnosis not present

## 2018-10-24 DIAGNOSIS — M25551 Pain in right hip: Secondary | ICD-10-CM | POA: Diagnosis not present

## 2018-10-24 DIAGNOSIS — M25561 Pain in right knee: Secondary | ICD-10-CM | POA: Diagnosis not present

## 2018-10-24 DIAGNOSIS — M7061 Trochanteric bursitis, right hip: Secondary | ICD-10-CM

## 2018-10-24 DIAGNOSIS — R600 Localized edema: Secondary | ICD-10-CM | POA: Diagnosis not present

## 2018-10-24 MED ORDER — FUROSEMIDE 40 MG PO TABS: 40.0000 mg | ORAL_TABLET | Freq: Two times a day (BID) | ORAL | 2 refills | Status: DC

## 2018-10-24 MED ORDER — ALPRAZOLAM 0.5 MG PO TABS: 0.5000 mg | ORAL_TABLET | Freq: Every evening | ORAL | 1 refills | Status: DC | PRN

## 2018-10-24 MED ORDER — PREDNISONE 50 MG PO TABS: ORAL_TABLET | ORAL | 0 refills | Status: DC

## 2018-10-24 MED ORDER — GLIPIZIDE 5 MG PO TABS: ORAL_TABLET | ORAL | 1 refills | Status: DC

## 2018-10-24 MED ORDER — VITAMIN D (ERGOCALCIFEROL) 1.25 MG (50000 UNIT) PO CAPS: 50000.0000 [IU] | ORAL_CAPSULE | ORAL | 1 refills | Status: DC

## 2018-10-24 MED ORDER — CYCLOBENZAPRINE HCL 10 MG PO TABS: 10.0000 mg | ORAL_TABLET | Freq: Three times a day (TID) | ORAL | 0 refills | Status: DC | PRN
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
## 2018-10-24 NOTE — Patient Instructions (Signed)

## 2018-10-25 LAB — LIPID PANEL W/REFLEX DIRECT LDL
Cholesterol: 205 mg/dL — ABNORMAL HIGH (ref <200)
HDL: 48 mg/dL — ABNORMAL LOW (ref >50)
LDL Cholesterol (Calc): 123 mg/dL (calc) — ABNORMAL HIGH
Non-HDL Cholesterol (Calc): 157 mg/dL (calc) — ABNORMAL HIGH (ref <130)
Total CHOL/HDL Ratio: 4.3 (calc) (ref <5.0)
Triglycerides: 220 mg/dL — ABNORMAL HIGH (ref <150)

## 2018-10-25 LAB — COMPLETE METABOLIC PANEL WITH GFR
AG Ratio: 1.4 (calc) (ref 1.0–2.5)
ALT: 10 U/L (ref 6–29)
AST: 15 U/L (ref 10–35)
Albumin: 3.7 g/dL (ref 3.6–5.1)
Alkaline phosphatase (APISO): 68 U/L (ref 33–130)
BUN/Creatinine Ratio: 20 (calc) (ref 6–22)
BUN: 33 mg/dL — ABNORMAL HIGH (ref 7–25)
CO2: 18 mmol/L — ABNORMAL LOW (ref 20–32)
Calcium: 9.1 mg/dL (ref 8.6–10.4)
Chloride: 100 mmol/L (ref 98–110)
Creat: 1.66 mg/dL — ABNORMAL HIGH (ref 0.50–0.99)
GFR, Est African American: 37 mL/min/{1.73_m2} — ABNORMAL LOW (ref >=60)
GFR, Est Non African American: 32 mL/min/{1.73_m2} — ABNORMAL LOW (ref >=60)
Globulin: 2.7 g/dL (calc) (ref 1.9–3.7)
Glucose, Bld: 106 mg/dL — ABNORMAL HIGH (ref 65–99)
Potassium: 5 mmol/L (ref 3.5–5.3)
Sodium: 134 mmol/L — ABNORMAL LOW (ref 135–146)
Total Bilirubin: 0.4 mg/dL (ref 0.2–1.2)
Total Protein: 6.4 g/dL (ref 6.1–8.1)

## 2018-10-25 LAB — HEMOGLOBIN A1C
Hgb A1c MFr Bld: 5.4 % of total Hgb (ref <5.7)
Mean Plasma Glucose: 108 (calc)
eAG (mmol/L): 6 (calc)

## 2018-10-25 LAB — MAGNESIUM: Magnesium: 1.6 mg/dL (ref 1.5–2.5)

## 2018-10-25 NOTE — Progress Notes (Signed)
Call pt: kidney function is up a little bit. Sodium is down a tad.  Magnesium is normal but very low end of normal.  A!C is much better at 5.4.  Cholesterol has worsened a little bit but not taking lipitor. We did discuss to take max dose that she can tolerate.

## 2018-10-29 ENCOUNTER — Encounter: Payer: Self-pay | Admitting: Physician Assistant

## 2018-10-30 ENCOUNTER — Ambulatory Visit (INDEPENDENT_AMBULATORY_CARE_PROVIDER_SITE_OTHER): Payer: Medicare Other

## 2018-10-30 ENCOUNTER — Ambulatory Visit (INDEPENDENT_AMBULATORY_CARE_PROVIDER_SITE_OTHER): Payer: Medicare Other | Admitting: Family Medicine

## 2018-10-30 ENCOUNTER — Encounter: Payer: Self-pay | Admitting: Family Medicine

## 2018-10-30 VITALS — BP 148/83 | HR 97 | Ht 60.0 in | Wt 301.0 lb

## 2018-10-30 DIAGNOSIS — M25551 Pain in right hip: Secondary | ICD-10-CM

## 2018-10-30 DIAGNOSIS — M25561 Pain in right knee: Secondary | ICD-10-CM

## 2018-10-30 DIAGNOSIS — Z419 Encounter for procedure for purposes other than remedying health state, unspecified: Secondary | ICD-10-CM

## 2018-10-30 DIAGNOSIS — Z96651 Presence of right artificial knee joint: Secondary | ICD-10-CM | POA: Diagnosis not present

## 2018-10-30 DIAGNOSIS — Z471 Aftercare following joint replacement surgery: Secondary | ICD-10-CM | POA: Diagnosis not present

## 2018-10-30 LAB — GLUCOSE, POCT (MANUAL RESULT ENTRY): POC Glucose: 51 mg/dl — AB (ref 70–99)

## 2018-10-30 NOTE — Patient Instructions (Signed)
Thank you for coming in today. Call or go to the ER if you develop a large red swollen joint with extreme pain or oozing puss.  Work on Hip abductor strength.  You should hear about bone scan for the knee.  Recheck in 4 weeks.  Return sooner if needed.     Trochanteric Bursitis Rehab Ask your health care provider which exercises are safe for you. Do exercises exactly as told by your health care provider and adjust them as directed. It is normal to feel mild stretching, pulling, tightness, or discomfort as you do these exercises, but you should stop right away if you feel sudden pain or your pain gets worse.Do not begin these exercises until told by your health care provider. Stretching exercises These exercises warm up your muscles and joints and improve the movement and flexibility of your hip. These exercises also help to relieve pain and stiffness. Exercise A: Iliotibial band stretch  1. Lie on your side with your left / right leg in the top position. 2. Bend your left / right knee and grab your ankle. 3. Slowly bring your knee back so your thigh is behind your body. 4. Slowly lower your knee toward the floor until you feel a gentle stretch on the outside of your left / right thigh. If you do not feel a stretch and your knee will not fall farther, place the heel of your other foot on top of your outer knee and pull your thigh down farther. 5. Hold this position for __________ seconds. 6. Slowly return to the starting position. Repeat __________ times. Complete this exercise __________ times a day. Strengthening exercises These exercises build strength and endurance in your hip and pelvis. Endurance is the ability to use your muscles for a long time, even after they get tired. Exercise B: Bridge ( hip extensors) 1. Lie on your back on a firm surface with your knees bent and your feet flat on the floor. 2. Tighten your buttocks muscles and lift your buttocks off the floor until your  trunk is level with your thighs. You should feel the muscles working in your buttocks and the back of your thighs. If this exercise is too easy, try doing it with your arms crossed over your chest. 3. Hold this position for __________ seconds. 4. Slowly return to the starting position. 5. Let your muscles relax completely between repetitions. Repeat __________ times. Complete this exercise __________ times a day. Exercise C: Squats ( knee extensors and  quadriceps) 1. Stand in front of a table, with your feet and knees pointing straight ahead. You may rest your hands on the table for balance but not for support. 2. Slowly bend your knees and lower your hips like you are going to sit in a chair. ? Keep your weight over your heels, not over your toes. ? Keep your lower legs upright so they are parallel with the table legs. ? Do not let your hips go lower than your knees. ? Do not bend lower than told by your health care provider. ? If your hip pain increases, do not bend as low. 3. Hold this position for __________ seconds. 4. Slowly push with your legs to return to standing. Do not use your hands to pull yourself to standing. Repeat __________ times. Complete this exercise __________ times a day. Exercise D: Hip hike 1. Stand sideways on a bottom step. Stand on your left / right leg with your other foot unsupported next to the step.  You can hold onto the railing or wall if needed for balance. 2. Keeping your knees straight and your torso square, lift your left / right hip up toward the ceiling. 3. Hold this position for __________ seconds. 4. Slowly let your left / right hip lower toward the floor, past the starting position. Your foot should get closer to the floor. Do not lean or bend your knees. Repeat __________ times. Complete this exercise __________ times a day. Exercise E: Single leg stand 1. Stand near a counter or door frame that you can hold onto for balance as needed. It is helpful  to stand in front of a mirror for this exercise so you can watch your hip. 2. Squeeze your left / right buttock muscles then lift up your other foot. Do not let your left / right hip push out to the side. 3. Hold this position for __________ seconds. Repeat __________ times. Complete this exercise __________ times a day. This information is not intended to replace advice given to you by your health care provider. Make sure you discuss any questions you have with your health care provider. Document Released: 01/20/2005 Document Revised: 08/19/2016 Document Reviewed: 11/28/2015 Elsevier Interactive Patient Education  [employer].

## 2018-10-31 NOTE — Progress Notes (Signed)
[name] is a 67 y.o. female who presents to [facility] today for right lateral hip pain and right knee pain.  [name] notes pain ongoing in the right lateral hip for a few weeks.  She denies any falls or injury.  Pain is worse when she lays on her right side and when she climbs stairs and stands from a seated position.  She had a trial of a short course of oral prednisone which has not helped.  She notes the pain is predominantly in the lateral hip but does refer down to the lateral thigh but not below the knee.  She has not had much treatment yet for this aside from medication as above.  She is been doing some home exercise program which has not helped much.  Additionally she notes right knee pain.  She is status post right total knee replacement several years ago.  She notes her knee is painful worse with activity better with rest.  She denies any falls or injuries.  She is worried about hardware loosening.  Patient notes that she recently took her diabetes medicine is feeling a bit fatigued.  She is worried that she may be a bit hypoglycemic and like would like to know her blood sugar.  She has glucose to consume if her blood sugar is low.  ROS:  As above  Exam:  BP (!) 148/83   Pulse 97   Ht 5' (1.524 m)   Wt (!) 301 lb (136.5 kg)   BMI 58.79 kg/m  General: Well Developed, morbidly obese and in no acute distress.  Neuro/Psych: Alert and oriented x3, extra-ocular muscles intact, able to move all 4 extremities, sensation grossly intact. Skin: Warm and dry, no rashes noted.  Respiratory: Not using accessory muscles, speaking in full sentences, trachea midline.  Cardiovascular: Pulses palpable, no extremity edema. Abdomen: Does not appear distended. MSK:  Right hip normal-appearing normal motion. Tender to palpation lateral hip at greater trochanter.  Hip abduction strength diminished 4/5.  Antalgic gait.  Right knee well-appearing  midline scar.  No large effusion.  Diffusely tender at the anterior knee.  Normal knee motion.  Intact strength.  Greater trochanteric bursa injection: right Consent obtained and timeout performed.  Patient laying on side with affected hip up.   Area located and marked.   Skin cleaned with rubbing alcohol and chlorhexidine, and cold spray applied Using a spinal needle the greater trochanteric bursa was accessed.   80 mg of de[pomedrol and 4 mL of Marcaine were injected in a wheel pattern.   Immediate improvement following injection:  Patient tolerated procedure well with no weakness or numbness or bleeding.     Lab and Radiology Results Results for orders placed or performed in visit on 10/30/18 (from the past 72 hour(s))  POCT glucose (manual entry)     Status: Abnormal   Collection Time: 10/30/18  3:28 PM  Result Value Ref Range   POC Glucose 51 (A) 70 - 99 mg/dl   Dg Knee Complete 4 Views Right  Result Date: 10/30/2018 CLINICAL DATA:  Acute right knee pain. EXAM: RIGHT KNEE - COMPLETE 4+ VIEW COMPARISON:  None. FINDINGS: Status post right total knee arthroplasty. The femoral and tibial components appear to be well situated. No fracture or dislocation is noted. No soft tissue abnormality is noted. IMPRESSION: Status post right total knee arthroplasty. No acute abnormality is noted. Electronically Signed   By: Marijo Conception, M.D.   On:  10/30/2018 16:51   Dg Hip Unilat With Pelvis 2-3 Views Right  Result Date: 10/30/2018 CLINICAL DATA:  Acute right hip pain. EXAM: DG HIP (WITH OR WITHOUT PELVIS) 2-3V RIGHT COMPARISON:  None. FINDINGS: There is no evidence of hip fracture or dislocation. There is no evidence of arthropathy or other focal bone abnormality. IMPRESSION: Negative. Electronically Signed   By: Marijo Conception, M.D.   On: 10/30/2018 16:50   I personally (independently) visualized and performed the interpretation of the images attached in this note.    Assessment and  Plan: 67 y.o. female with  Right lateral hip pain trochanteric bursitis.  Injection as above.  Proceed with dedicated home exercise program.  Patient declined referral to home physical therapy.  Recheck in 1 month.  Knee pain: X-ray unremarkable.  Plan for three-phase bone scan for evaluation for hardware loosening.  Hypoglycemia: Blood sugar 50.  Patient has glucose that she will consume now.  Her sister who has a formal registered nurse is available in with her currently to observe observe her at home to make sure blood sugar improves.  This is not unusual for her.  Follow-up with PCP to discuss glucose management strategies in the future.    Orders Placed This Encounter  Procedures  . DG HIP UNILAT WITH PELVIS 2-3 VIEWS RIGHT    Standing Status:   Future    Number of Occurrences:   1    Standing Expiration Date:   12/31/2019    Order Specific Question:   Reason for Exam (SYMPTOM  OR DIAGNOSIS REQUIRED)    Answer:   right hip pain    Order Specific Question:   Preferred imaging location?    Answer:   [facility]    Order Specific Question:   Radiology Contrast Protocol - do NOT remove file path    Answer:   \\[email or web address]\DXFluoroContrastProtocols.pdf  . DG Knee Complete 4 Views Right    Please include patellar sunrise, lateral, and weightbearing bilateral AP and bilateral rosenberg views    Standing Status:   Future    Number of Occurrences:   1    Standing Expiration Date:   12/30/2019    Order Specific Question:   Reason for exam:    Answer:   Please include patellar sunrise, lateral, and weightbearing bilateral AP and bilateral rosenberg views    Comments:   Please include patellar sunrise, lateral, and weightbearing bilateral AP and bilateral rosenberg views    Order Specific Question:   Preferred imaging location?    Answer:   [facility]  . DG Knee 1-2 Views Left    Standing Status:   Future    Number of Occurrences:   1    Standing  Expiration Date:   12/31/2019    Order Specific Question:   Reason for Exam (SYMPTOM  OR DIAGNOSIS REQUIRED)    Answer:   For use with right knee x-ray, bilateral AP and Rosenberg standing.    Order Specific Question:   Preferred imaging location?    Answer:   [facility]  . NM Bone Scan 3 Phase Lower Extremity    Standing Status:   Future    Standing Expiration Date:   12/31/2019    Order Specific Question:   If indicated for the ordered procedure, I authorize the administration of a radiopharmaceutical per Radiology protocol    Answer:   Yes    Order Specific Question:   Preferred imaging location?    Answer:   [facility]    Order Specific Question:   Radiology Contrast Protocol - do NOT remove file path    Answer:   \\[email or web address]\Radiant\[email or web address].pdf  . POCT glucose (manual entry)   No orders of the defined types were placed in this encounter.   Historical information moved to improve visibility of documentation.  Past Medical History:  Diagnosis Date  . Anemia    hx of low iron  . Anxiety   . Arthritis   . Cataracts, bilateral   . Chronic kidney disease    nephrologist  Dr. Gillian Shields [facility], told pt. she is stage III  . Diabetes ([place])   . Family history of coronary artery disease   . Fatty liver   . GERD (gastroesophageal reflux disease)   . Headache   . Morbid obesity ([place])   . Peripheral vascular disease ([place])   . PONV (postoperative nausea and vomiting)   . Pulmonary embolism ([place]) 2001  . Restless leg syndrome   . Shortness of breath dyspnea   . Sleep apnea    can't wear her cpap due to nose being sore  . Venous stasis dermatitis of both lower extremities    Past Surgical History:  Procedure Laterality Date  . ABDOMINAL HYSTERECTOMY    . APPENDECTOMY    . BUNIONECTOMY Right 01/28/2015   Procedure: Lillard Anes;  Surgeon: Hessie Dibble, MD;  Location: [place];  Service: Orthopedics;  Laterality: Right;  . CARDIAC  CATHETERIZATION  2014  . CHOLECYSTECTOMY    . COLONOSCOPY    . COLONOSCOPY    . FRACTURE SURGERY Left    shoulder  . HERNIA REPAIR     umbilical hernia  . JOINT REPLACEMENT Right 2001   knee  . PANNICULECTOMY    . SHOULDER ARTHROSCOPY Right 02/24/2016   Procedure: RIGHT SHOULDER ARTHROSCOPY, ACROMIOPLASTY, DISTAL CLAVICLE RESECTION, DEBRIDEMENT;  Surgeon: Melrose Nakayama, MD;  Location: [facility];  Service: Orthopedics;  Laterality: Right;  . TENNIS ELBOW RELEASE/NIRSCHEL PROCEDURE Right 02/24/2016   Procedure: RIGHT ELBOW LATERAL REPAIR;  Surgeon: Melrose Nakayama, MD;  Location: [facility];  Service: Orthopedics;  Laterality: Right;   Social History   Tobacco Use  . Smoking status: Never Smoker  . Smokeless tobacco: Never Used  Substance Use Topics  . Alcohol use: No   family history includes Cancer in her maternal grandfather and maternal grandmother; Crohn's disease in her sister; Diabetes in her mother; Heart attack in her father and mother; Heart failure in her sister; Hyperlipidemia in her father and mother; Hypertension in her father, mother, and sister; Parkinsonism in her brother; Renal Disease in her sister; Stroke in her paternal grandfather; Thyroid disease in her sister.  Medications: Current Outpatient Medications  Medication Sig Dispense Refill  . acetaminophen (TYLENOL) 500 MG tablet Take 1,000 mg by mouth.    [name] Kitchen albuterol (PROVENTIL HFA;VENTOLIN HFA) 108 (90 Base) MCG/ACT inhaler Inhale 1-2 puffs into the lungs every 6 (six) hours as needed for wheezing or shortness of breath. 1 Inhaler 5  . ALPRAZolam (XANAX) 0.5 MG tablet Take 1 tablet (0.5 mg total) by mouth at bedtime as needed for anxiety. 15 tablet 1  . aspirin 81 MG tablet Take 81 mg by mouth daily.    [name] Kitchen atorvastatin (LIPITOR) 40 MG tablet Take 1 tablet (40 mg total) by mouth daily. 90 tablet 1  . budesonide-formoterol (SYMBICORT) 160-4.5 MCG/ACT inhaler Inhale 2 puffs into the lungs 2 (two) times daily. 3 Inhaler 3  .  cefpodoxime (VANTIN) 200 MG  tablet Take 2 tablets (400 mg total) by mouth 2 (two) times daily. For 14 days. 56 tablet 0  . citalopram (CELEXA) 40 MG tablet Take 1 tablet (40 mg total) by mouth daily. 90 tablet 1  . co-enzyme Q-10 50 MG capsule Take 50 mg by mouth daily.    . cyclobenzaprine (FLEXERIL) 10 MG tablet Take 1 tablet (10 mg total) by mouth 3 (three) times daily as needed for muscle spasms. 30 tablet 0  . DULoxetine (CYMBALTA) 60 MG capsule 2 po daily 180 capsule 1  . furosemide (LASIX) 40 MG tablet Take 1 tablet (40 mg total) by mouth 2 (two) times daily. 60 tablet 2  . glipiZIDE (GLUCOTROL) 5 MG tablet TAKE ONE TABLET BY MOUTH ONCE DAILY BEFORE BREAKFAST 90 tablet 1  . glucose blood (ONE TOUCH ULTRA TEST) test strip Check fasting blood sugar every am and random 2 hours after 1 meal daily. DM E11.9 200 each 5  . HYDROcodone-acetaminophen (NORCO/VICODIN) 5-325 MG tablet Take 1 tablet by mouth every 8 (eight) hours as needed for moderate pain. 60 tablet 0  . Lancets (ONETOUCH ULTRASOFT) lancets Check fasting blood sugar every am and random 2 hours after 1 meal daily. DM E11.9 200 each 5  . losartan (COZAAR) 25 MG tablet Take 1 tablet (25 mg total) by mouth daily. 90 tablet 1  . metFORMIN (GLUCOPHAGE) 1000 MG tablet Take 1 tablet (1,000 mg total) by mouth 2 (two) times daily with a meal. 180 tablet 2  . Multiple Vitamins-Minerals (OCUVITE PO) Take 1 tablet by mouth daily.    . potassium chloride SA (K-DUR,KLOR-CON) 20 MEQ tablet Take 0.5 tablets (10 mEq total) by mouth daily. 45 tablet 1  . predniSONE (DELTASONE) 50 MG tablet One tab PO daily for 5 days. 5 tablet 0  . promethazine (PHENERGAN) 25 MG tablet Take 1 tablet (25 mg total) by mouth every 6 (six) hours as needed for nausea or vomiting. 30 tablet 0  . spironolactone (ALDACTONE) 25 MG tablet Take 1 tablet (25 mg total) by mouth daily. 90 tablet 1  . Vitamin D, Ergocalciferol, (DRISDOL) 50000 units CAPS capsule Take 1 capsule (50,000  Units total) by mouth once a week. 12 capsule 1   No current facility-administered medications for this visit.    Allergies  Allergen Reactions  . Amlodipine Itching and Rash  . Lovastatin Itching  . Sulfa Antibiotics Itching  . Sulfacetamide Sodium Itching  . Sulfamethoxazole Itching      Discussed warning signs or symptoms. Please see discharge instructions. Patient expresses understanding.

## 2018-11-06 ENCOUNTER — Other Ambulatory Visit: Payer: Self-pay | Admitting: Physician Assistant

## 2018-11-06 DIAGNOSIS — M25511 Pain in right shoulder: Principal | ICD-10-CM

## 2018-11-06 DIAGNOSIS — G8929 Other chronic pain: Secondary | ICD-10-CM

## 2018-11-06 MED ORDER — HYDROCODONE-ACETAMINOPHEN 5-325 MG PO TABS: 1.0000 | ORAL_TABLET | Freq: Three times a day (TID) | ORAL | 0 refills | Status: DC | PRN

## 2018-11-06 NOTE — Telephone Encounter (Signed)
Called Pt, she reports her leg was hurting she bad she was taking them every 6 hours so she is out. She said its supposed to rain/snow tomorrow and will need a refill because cold weather makes her bones hurt. Routing.

## 2018-11-06 NOTE — Telephone Encounter (Signed)
Yes her contract is for one tablet twice a day. Can we call her to see what is going on?

## 2018-11-06 NOTE — Telephone Encounter (Signed)
Educate patient she needs to abide by contract. If not controlling pain we could consider pain management.

## 2018-11-10 IMAGING — CT CT ABD-PELV W/ CM
2 of 5 series · 16 of 46 positions shown, 18 images · IV contrast (APPLIED)
Comparison: None.

CLINICAL DATA: "lump" rt of umbilical for few weeks, bloating,
feeling full all the time, abd pain, only able to eat 1 meal a day.

EXAM:
CT ABDOMEN AND PELVIS WITH CONTRAST
TECHNIQUE: Multidetector CT imaging of the abdomen and pelvis was performed
using the standard protocol following bolus administration of
intravenous contrast.
CONTRAST:  100mL [id number] IOPAMIDOL ([id number]) INJECTION 61%

[Series 2: axial st · axial · 0.98mm/px · z∈[-547,-82]mm · 13 of 105 slices shown, 15 images]
[im 6/105  soft-tissue]
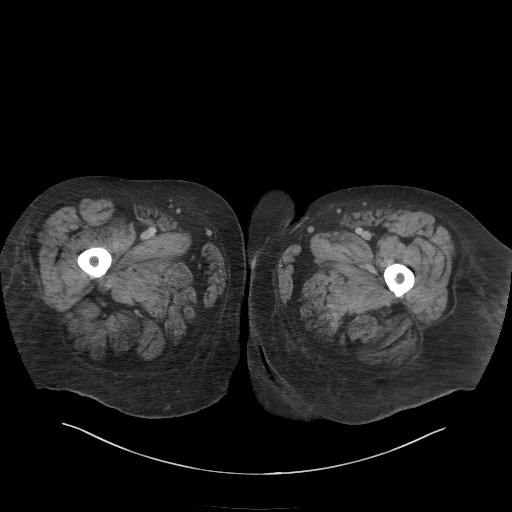
[im 6/105  bone]
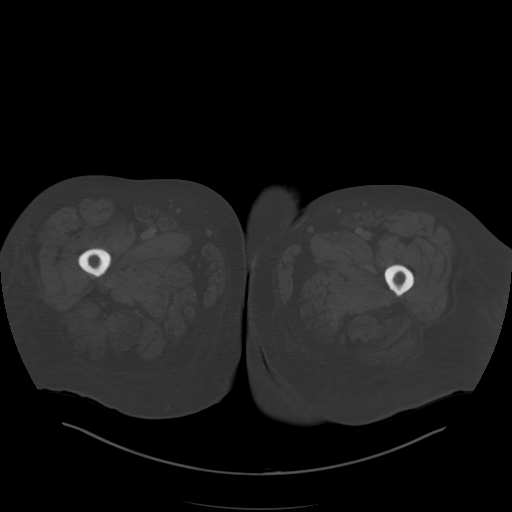
[im 17/105  soft-tissue]
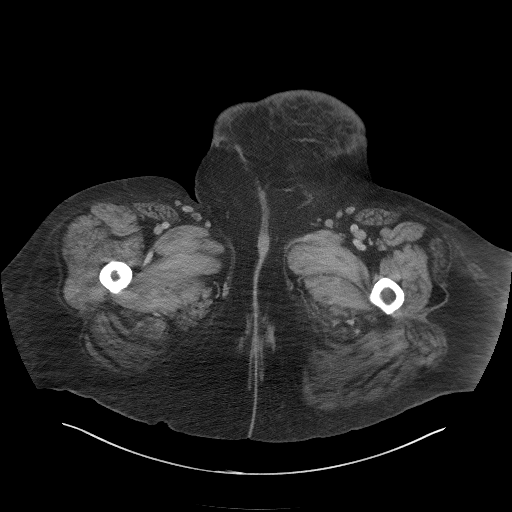
[im 22/105  soft-tissue]
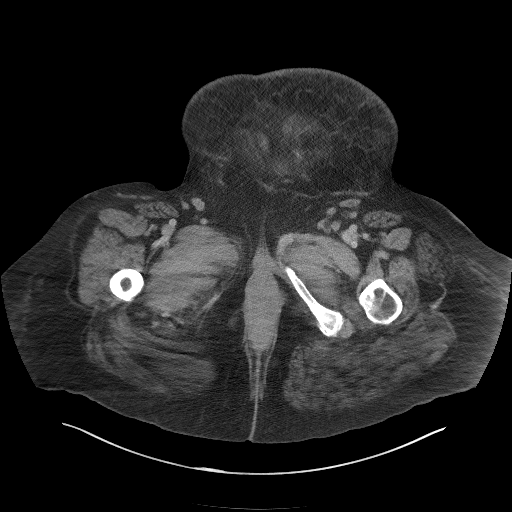
[im 28/105  soft-tissue]
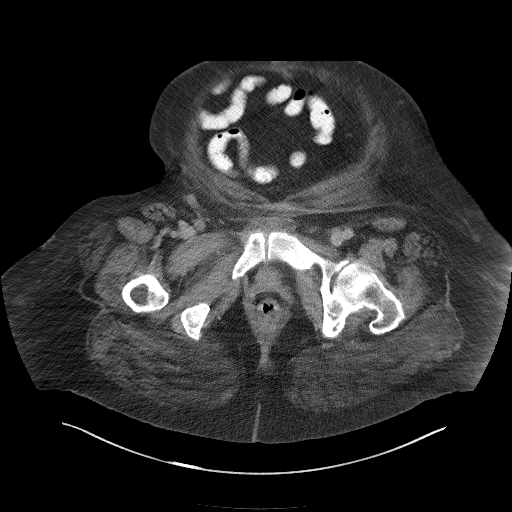
[im 39/105  soft-tissue]
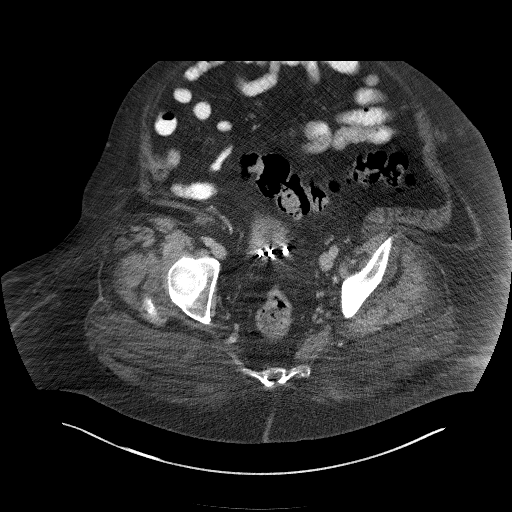
[im 44/105  soft-tissue]
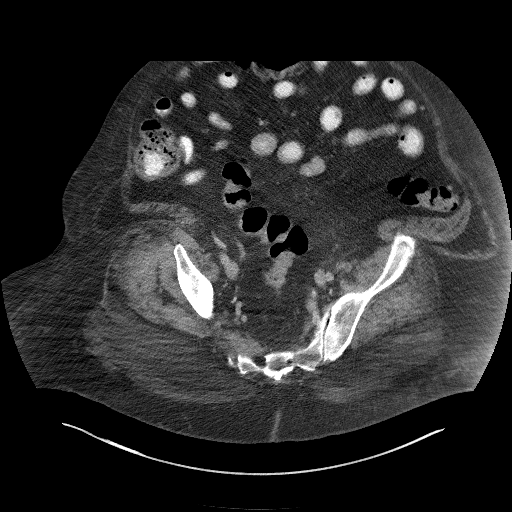
[im 55/105  soft-tissue]
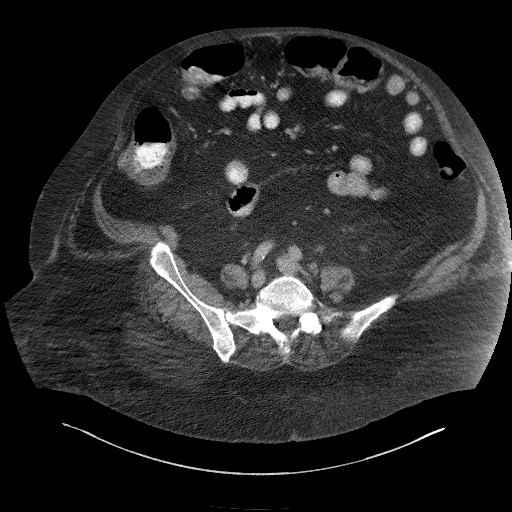
[im 61/105  soft-tissue]
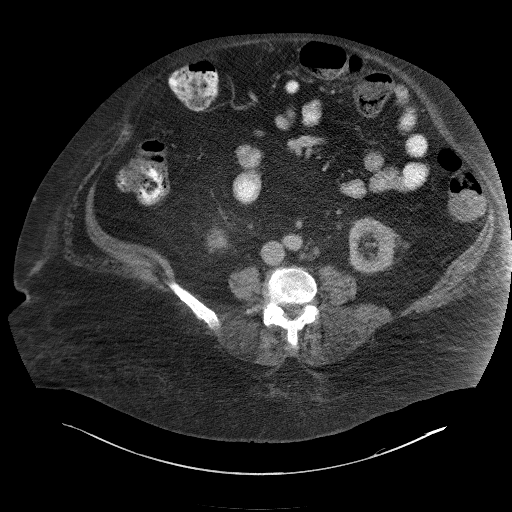
[im 66/105  soft-tissue]
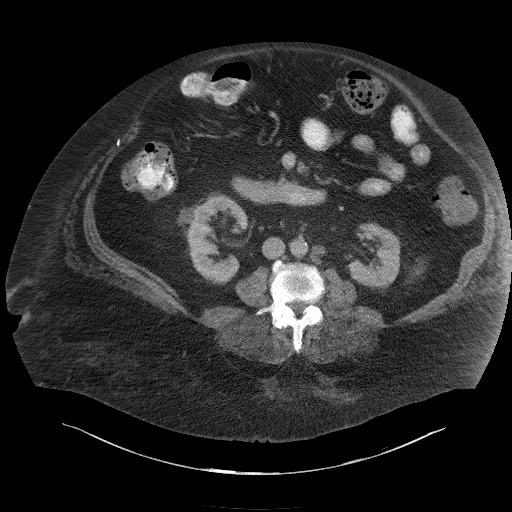
[im 66/105  bone]
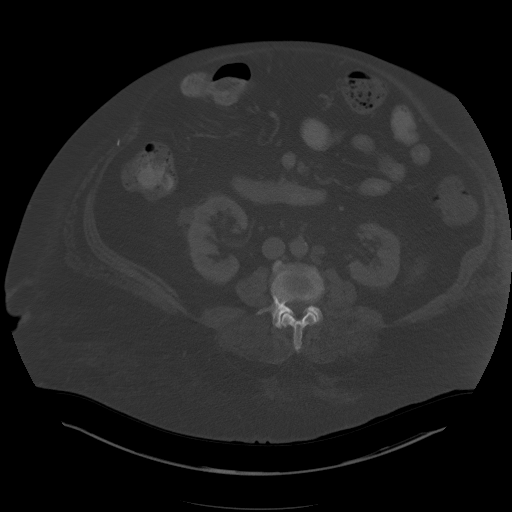
[im 77/105  soft-tissue]
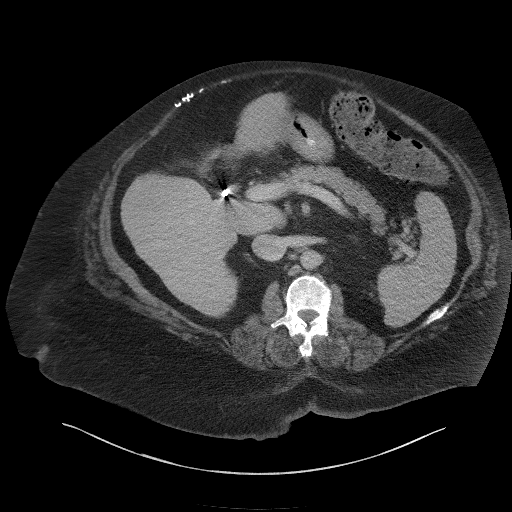
[im 83/105  soft-tissue]
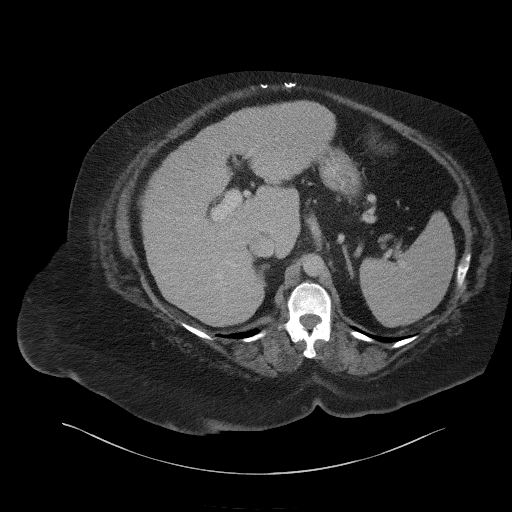
[im 88/105  soft-tissue]
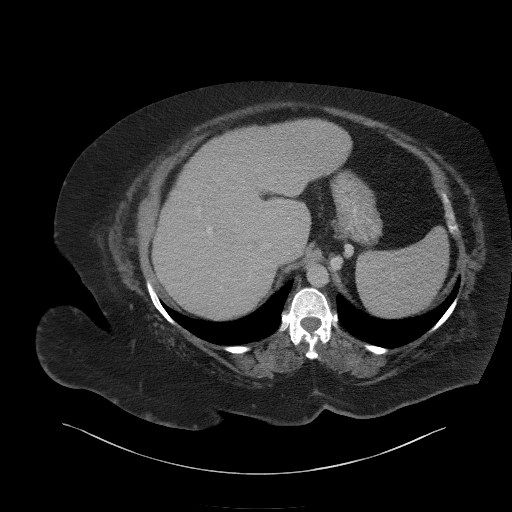
[im 99/105  soft-tissue]
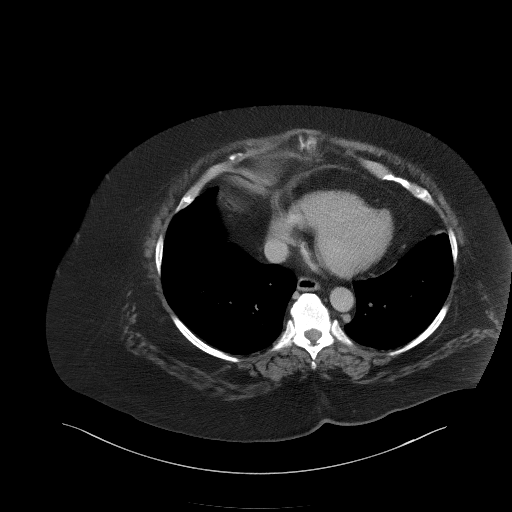

[Series 5: coronal st · coronal · 1.09mm/px · 3 of 140 slices shown]
[im 47/140  soft-tissue]
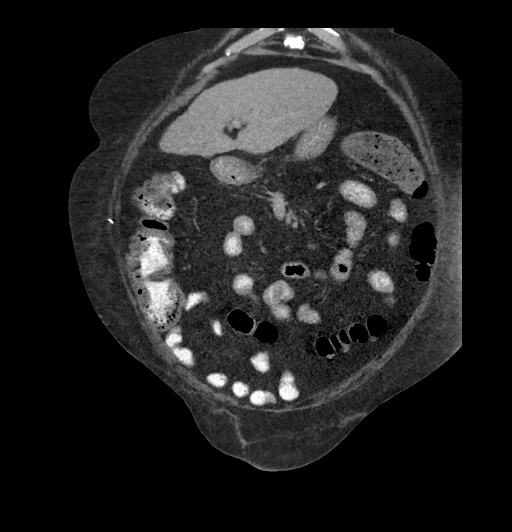
[im 62/140  soft-tissue]
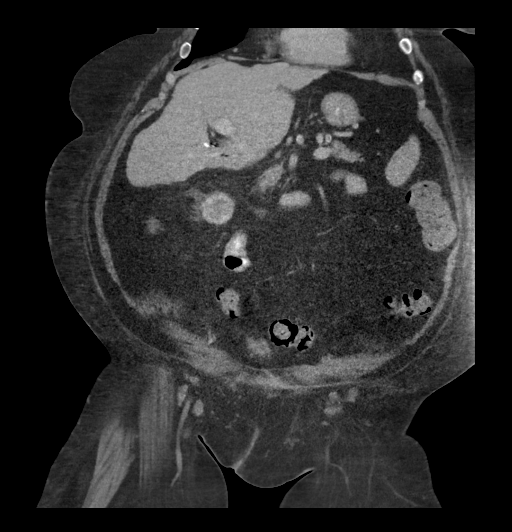
[im 78/140  soft-tissue]
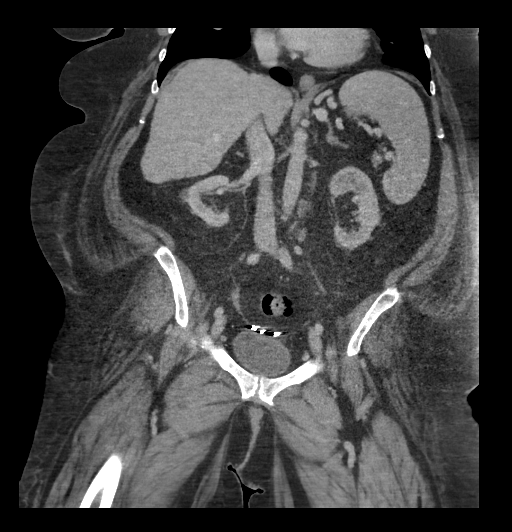

[16 of 46 positions shown; findings below may reference images not displayed]

FINDINGS: Lower chest: Lung bases are clear.

Hepatobiliary: The liver has a nodular contour. Postcholecystectomy.
Caudate lobe is enlarged. Portal veins are patent. No enhancing
lesion.

Pancreas: Pancreas is normal. No ductal dilatation. No pancreatic
inflammation.

Spleen: Normal spleen

Adrenals/urinary tract: Adrenal glands are normal. Low-density cyst
the pole of the LEFT kidney. No ureteral obstruction. Ureters and
bladder normal.

Stomach/Bowel: Stomach, small-bowel and cecum normal. Appendix not
identified. The colon and rectosigmoid colon are normal.

There is laxity of the ventral abdominal wall musculature and fascia
with out evidence of frank herniation.

Vascular/Lymphatic: Abdominal aorta is normal caliber. There is no
retroperitoneal or periportal lymphadenopathy. No pelvic
lymphadenopathy.

There is venous collaterals extending along the gastric cardia
superior to the LEFT adrenal gland

Reproductive: Post hysterectomy

Other: Is mild subcutaneous thickening adjacent EF elbow like gas
measuring 8 mm (image 78, series 1.

Musculoskeletal: No aggressive osseous lesion.
IMPRESSION: 1. No discrete lesion at the umbilicus. Subcutaneous thickening
could represent palpable lesion. No frank of abdominal wall
herniation. While the patient does have venous collaterals, No
varices noted at the umbilicus.
2. Morphologic changes in liver consistent with cirrhosis. Evidence
of portal hypertension with venous collaterals along the gastric
cardia.

## 2018-11-10 NOTE — Telephone Encounter (Signed)
Pt advised. Verbalized understanding. She will make this Rx last for the full 30 days.

## 2018-11-14 ENCOUNTER — Encounter (HOSPITAL_COMMUNITY)
Admission: RE | Admit: 2018-11-14 | Discharge: 2018-11-14 | Disposition: A | Discharge status: Home / Self Care | Payer: Medicare Other | Source: Ambulatory Visit | Attending: Family Medicine | Admitting: Family Medicine

## 2018-11-14 ENCOUNTER — Ambulatory Visit (HOSPITAL_COMMUNITY)
Admission: RE | Admit: 2018-11-14 | Discharge: 2018-11-14 | Disposition: A | Discharge status: Home / Self Care | Payer: Medicare Other | Source: Ambulatory Visit | Attending: Family Medicine | Admitting: Family Medicine

## 2018-11-14 DIAGNOSIS — M25561 Pain in right knee: Secondary | ICD-10-CM | POA: Insufficient documentation

## 2018-11-14 DIAGNOSIS — S8991XA Unspecified injury of right lower leg, initial encounter: Secondary | ICD-10-CM | POA: Diagnosis not present

## 2018-11-14 MED ORDER — TECHNETIUM TC 99M MEDRONATE IV KIT
20.0000 | PACK | Freq: Once | INTRAVENOUS | Status: AC | PRN
  Administered 2018-11-14: 20 via INTRAVENOUS

## 2018-11-15 ENCOUNTER — Ambulatory Visit (INDEPENDENT_AMBULATORY_CARE_PROVIDER_SITE_OTHER): Payer: Medicare Other

## 2018-11-15 ENCOUNTER — Ambulatory Visit (INDEPENDENT_AMBULATORY_CARE_PROVIDER_SITE_OTHER): Payer: Medicare Other | Admitting: Family Medicine

## 2018-11-15 ENCOUNTER — Encounter: Payer: Self-pay | Admitting: Family Medicine

## 2018-11-15 VITALS — BP 145/76 | HR 84 | Wt 302.0 lb

## 2018-11-15 DIAGNOSIS — G8929 Other chronic pain: Secondary | ICD-10-CM

## 2018-11-15 DIAGNOSIS — M25551 Pain in right hip: Secondary | ICD-10-CM | POA: Diagnosis not present

## 2018-11-15 DIAGNOSIS — M79604 Pain in right leg: Secondary | ICD-10-CM | POA: Diagnosis not present

## 2018-11-15 DIAGNOSIS — M79651 Pain in right thigh: Secondary | ICD-10-CM

## 2018-11-15 DIAGNOSIS — M25561 Pain in right knee: Secondary | ICD-10-CM | POA: Diagnosis not present

## 2018-11-15 DIAGNOSIS — E119 Type 2 diabetes mellitus without complications: Secondary | ICD-10-CM

## 2018-11-15 MED ORDER — VITAMIN D (ERGOCALCIFEROL) 1.25 MG (50000 UNIT) PO CAPS: 50000.0000 [IU] | ORAL_CAPSULE | ORAL | 1 refills | Status: DC

## 2018-11-15 MED ORDER — GLIPIZIDE 5 MG PO TABS: ORAL_TABLET | ORAL | 1 refills | Status: DC

## 2018-11-15 NOTE — Progress Notes (Signed)
[name] is a 67 y.o. female who presents to [facility] today for right hip and right knee pain.  Brandi "[name]" was seen for right lateral hip pain and right knee pain on October 29 and November 4.  She has a pertinent past surgical history for right total knee replacement years ago.  She had right lateral hip pain thought to be trochanteric bursitis.  She had a trial of right lateral hip injection which provided temporary pain relief.  This was done on November 4.  However a few days after that injection (about 10 days ago today) she fell out of her chair at home landing on her right side.  She had x-ray right hip November 4.  She notes she continues to experience pain in her right lateral hip.  She has pain is worse with standing from a seated position and climbing stairs.  Additionally in October and subsequently early November she also was complaining of right knee pain.  She had x-rays of right knee November 4 prior to the fall as above.  The prosthesis looked to be intact with no significant changes.  As her pain was somewhat chronic I proceeded with three-phase bone scan which she had yesterday.  The three-phase bone scan did show uptake at the right medial femoral condyle concerning for hardware loosening.  Again pain is worse with activity and somewhat better with rest.    ROS:  As above  Exam:  BP (!) 145/76   Pulse 84   Wt (!) 302 lb (137 kg)   BMI 58.98 kg/m   General: Well Developed, well nourished, and in no acute distress.  Neuro/Psych: Alert and oriented x3, extra-ocular muscles intact, able to move all 4 extremities, sensation grossly intact. Skin: Warm and dry, no rashes noted.  Respiratory: Not using accessory muscles, speaking in full sentences, trachea midline.  Cardiovascular: Pulses palpable, no extremity edema. Abdomen: Does not appear distended. MSK: Musculoskeletal exam limited due to body habitus. Right hip  not particularly tender.  Decreased motion. Right knee relatively normal-appearing with intact midline scar.  Diffusely tender. Normal motion.     Lab and Radiology Results No results found for this or any previous visit (from the past 72 hour(s)). Nm Bone Scan 3 Phase Lower Extremity  Result Date: 11/14/2018 CLINICAL DATA:  [place] 2 weeks ago onto RIGHT knee, having pain from RIGHT hip to foot, history of RIGHT total knee arthroplasty in 2001 EXAM: NUCLEAR MEDICINE 3-PHASE BONE SCAN TECHNIQUE: Radionuclide angiographic images, immediate static blood pool images, and 3-hour delayed static images were obtained of the knees after intravenous injection of radiopharmaceutical. RADIOPHARMACEUTICALS:  22 mCi Tc-58m MDP IV COMPARISON:  None Correlation: LEFT knee radiographs 10/30/2018 FINDINGS: Vascular phase: Minimally increased blood flow to the RIGHT knee region versus LEFT Blood pool phase: Increased blood pool at RIGHT medial femoral condyle. Slightly increased blood pool to the LEFT knee joint region. Delayed phase: Abnormal increased delayed uptake of tracer at the RIGHT medial femoral condyle anteriorly, could reflect prior trauma or prosthetic loosening. No focal abnormal increased tracer localization adjacent to the tibial component of the RIGHT knee prosthesis. Diffuse uptake at LEFT knee compatible with degenerative changes. IMPRESSION: Mildly increased blood flow and blood pool tracer at the RIGHT knee particularly blood pool at the RIGHT medial femoral condyle. Abnormal delayed increased tracer localization at the RIGHT medial femoral condyle anteriorly, could represent sequela of prior trauma such as occult fracture or bone contusion, or  be related to aseptic loosening of the femoral component of the RIGHT knee prosthesis. Electronically Signed   By: Lavonia Dana M.D.   On: 11/14/2018 16:07   I personally (independently) visualized and performed the interpretation of the images attached in this  note.  X-ray right femur images personally independently reviewed No acute fractures visible.  Intact hardware.  Not much change from x-ray right hip and right knee October 30, 2018. Await formal radiology review     Assessment and Plan: 67 y.o. female with right lateral hip pain.  Very likely trochanteric bursitis however patient is not improving.  She has been doing some home exercise program but may benefit from formal home physical therapy.  Patient declines referral at this time.  She is quite symptomatic with this and will proceed with MRI right hip.  Symptoms have been ongoing now for about a month.  Plan to proceed with MRI hip to evaluate pain further.  Right knee pain.  Status post total knee replacement 2011.  Patient with increased uptake on three-phase bone scan medial femoral condyle.  This is somewhat concerning for hardware loosening.  Recommend follow back up with her orthopedic surgeon.  Unfortunately due to her weight she may not be a very good surgical candidate.  Anticipate recommendations from orthopedics.    Orders Placed This Encounter  Procedures  . DG FEMUR, MIN 2 VIEWS RIGHT    Standing Status:   Future    Number of Occurrences:   1    Standing Expiration Date:   01/16/2020    Order Specific Question:   Reason for Exam (SYMPTOM  OR DIAGNOSIS REQUIRED)    Answer:   eval right thigh pain after fall at home.    Order Specific Question:   Preferred imaging location?    Answer:   [facility]    Order Specific Question:   Radiology Contrast Protocol - do NOT remove file path    Answer:   [email or web address]   Meds ordered this encounter  Medications  . Vitamin D, Ergocalciferol, (DRISDOL) 1.25 MG (50000 UT) CAPS capsule    Sig: Take 1 capsule (50,000 Units total) by mouth once a week.    Dispense:  12 capsule    Refill:  1    Please consider 90 day supplies to promote better adherence  . glipiZIDE (GLUCOTROL) 5  MG tablet    Sig: TAKE ONE TABLET BY MOUTH ONCE DAILY BEFORE BREAKFAST    Dispense:  90 tablet    Refill:  1    Historical information moved to improve visibility of documentation.  Past Medical History:  Diagnosis Date  . Anemia    hx of low iron  . Anxiety   . Arthritis   . Cataracts, bilateral   . Chronic kidney disease    nephrologist  Dr. Gillian Shields [facility], told pt. she is stage III  . Diabetes ([place])   . Family history of coronary artery disease   . Fatty liver   . GERD (gastroesophageal reflux disease)   . Headache   . Morbid obesity ([place])   . Peripheral vascular disease ([place])   . PONV (postoperative nausea and vomiting)   . Pulmonary embolism ([place]) 2001  . Restless leg syndrome   . Shortness of breath dyspnea   . Sleep apnea    can't wear her cpap due to nose being sore  . Venous stasis dermatitis of both lower extremities    Past Surgical History:  Procedure Laterality  Date  . ABDOMINAL HYSTERECTOMY    . APPENDECTOMY    . BUNIONECTOMY Right 01/28/2015   Procedure: Lillard Anes;  Surgeon: Hessie Dibble, MD;  Location: [facility];  Service: Orthopedics;  Laterality: Right;  . CARDIAC CATHETERIZATION  2014  . CHOLECYSTECTOMY    . COLONOSCOPY    . COLONOSCOPY    . FRACTURE SURGERY Left    shoulder  . HERNIA REPAIR     umbilical hernia  . JOINT REPLACEMENT Right 2001   knee  . PANNICULECTOMY    . SHOULDER ARTHROSCOPY Right 02/24/2016   Procedure: RIGHT SHOULDER ARTHROSCOPY, ACROMIOPLASTY, DISTAL CLAVICLE RESECTION, DEBRIDEMENT;  Surgeon: Melrose Nakayama, MD;  Location: [facility];  Service: Orthopedics;  Laterality: Right;  . TENNIS ELBOW RELEASE/NIRSCHEL PROCEDURE Right 02/24/2016   Procedure: RIGHT ELBOW LATERAL REPAIR;  Surgeon: Melrose Nakayama, MD;  Location: [facility];  Service: Orthopedics;  Laterality: Right;   Social History   Tobacco Use  . Smoking status: Never Smoker  . Smokeless tobacco: Never Used  Substance Use Topics  . Alcohol use: No   family  history includes Cancer in her maternal grandfather and maternal grandmother; Crohn's disease in her sister; Diabetes in her mother; Heart attack in her father and mother; Heart failure in her sister; Hyperlipidemia in her father and mother; Hypertension in her father, mother, and sister; Parkinsonism in her brother; Renal Disease in her sister; Stroke in her paternal grandfather; Thyroid disease in her sister.  Medications: Current Outpatient Medications  Medication Sig Dispense Refill  . acetaminophen (TYLENOL) 500 MG tablet Take 1,000 mg by mouth.    [name] Kitchen albuterol (PROVENTIL HFA;VENTOLIN HFA) 108 (90 Base) MCG/ACT inhaler Inhale 1-2 puffs into the lungs every 6 (six) hours as needed for wheezing or shortness of breath. 1 Inhaler 5  . ALPRAZolam (XANAX) 0.5 MG tablet Take 1 tablet (0.5 mg total) by mouth at bedtime as needed for anxiety. 15 tablet 1  . atorvastatin (LIPITOR) 40 MG tablet Take 1 tablet (40 mg total) by mouth daily. 90 tablet 1  . budesonide-formoterol (SYMBICORT) 160-4.5 MCG/ACT inhaler Inhale 2 puffs into the lungs 2 (two) times daily. 3 Inhaler 3  . citalopram (CELEXA) 40 MG tablet Take 1 tablet (40 mg total) by mouth daily. 90 tablet 1  . co-enzyme Q-10 50 MG capsule Take 50 mg by mouth daily.    . cyclobenzaprine (FLEXERIL) 10 MG tablet Take 1 tablet (10 mg total) by mouth 3 (three) times daily as needed for muscle spasms. 30 tablet 0  . DULoxetine (CYMBALTA) 60 MG capsule 2 po daily 180 capsule 1  . furosemide (LASIX) 40 MG tablet Take 1 tablet (40 mg total) by mouth 2 (two) times daily. 60 tablet 2  . glipiZIDE (GLUCOTROL) 5 MG tablet TAKE ONE TABLET BY MOUTH ONCE DAILY BEFORE BREAKFAST 90 tablet 1  . glucose blood (ONE TOUCH ULTRA TEST) test strip Check fasting blood sugar every am and random 2 hours after 1 meal daily. DM E11.9 200 each 5  . HYDROcodone-acetaminophen (NORCO/VICODIN) 5-325 MG tablet Take 1 tablet by mouth every 8 (eight) hours as needed for moderate pain. 60  tablet 0  . Lancets (ONETOUCH ULTRASOFT) lancets Check fasting blood sugar every am and random 2 hours after 1 meal daily. DM E11.9 200 each 5  . losartan (COZAAR) 25 MG tablet Take 1 tablet (25 mg total) by mouth daily. 90 tablet 1  . metFORMIN (GLUCOPHAGE) 1000 MG tablet Take 1 tablet (1,000 mg total) by mouth 2 (two) times daily  with a meal. 180 tablet 2  . Multiple Vitamins-Minerals (OCUVITE PO) Take 1 tablet by mouth daily.    . potassium chloride SA (K-DUR,KLOR-CON) 20 MEQ tablet Take 0.5 tablets (10 mEq total) by mouth daily. 45 tablet 1  . predniSONE (DELTASONE) 50 MG tablet One tab PO daily for 5 days. 5 tablet 0  . promethazine (PHENERGAN) 25 MG tablet Take 1 tablet (25 mg total) by mouth every 6 (six) hours as needed for nausea or vomiting. 30 tablet 0  . spironolactone (ALDACTONE) 25 MG tablet Take 1 tablet (25 mg total) by mouth daily. 90 tablet 1  . Vitamin D, Ergocalciferol, (DRISDOL) 1.25 MG (50000 UT) CAPS capsule Take 1 capsule (50,000 Units total) by mouth once a week. 12 capsule 1   No current facility-administered medications for this visit.    Allergies  Allergen Reactions  . Amlodipine Itching and Rash  . Lovastatin Itching  . Sulfa Antibiotics Itching  . Sulfacetamide Sodium Itching  . Sulfamethoxazole Itching      Discussed warning signs or symptoms. Please see discharge instructions. Patient expresses understanding.

## 2018-11-15 NOTE — Patient Instructions (Addendum)
Thank you for coming in today. Get xray now.  Follow up with Dr Latanya Maudlin about knee.  If xray femur is normal plan for MRI hip.  Follow up with me after HIP MRI.  If you get your MRI somewhere other than [facility] make sure they make a CD while you are there and bring the CD with you.   Keep an eye on the leg.   Return sooner if needed.

## 2018-11-20 ENCOUNTER — Ambulatory Visit: Payer: Medicare Other

## 2018-11-22 ENCOUNTER — Telehealth: Payer: Self-pay | Admitting: Physician Assistant

## 2018-11-22 DIAGNOSIS — M25561 Pain in right knee: Principal | ICD-10-CM

## 2018-11-22 DIAGNOSIS — M25562 Pain in left knee: Principal | ICD-10-CM

## 2018-11-22 DIAGNOSIS — G8929 Other chronic pain: Secondary | ICD-10-CM

## 2018-11-22 MED ORDER — PREDNISONE 50 MG PO TABS: ORAL_TABLET | ORAL | 0 refills | Status: DC

## 2018-11-22 NOTE — Telephone Encounter (Signed)
Pt advised.

## 2018-11-22 NOTE — Telephone Encounter (Signed)
Pt called clinic requesting an Rx for Predisone. She advised the cold, wet weather is causing her feet/legs to "swell and be very painful." Routing.

## 2018-11-22 NOTE — Telephone Encounter (Signed)
Sent!

## 2018-11-29 ENCOUNTER — Ambulatory Visit: Payer: Medicare Other | Admitting: Family Medicine

## 2018-11-30 DIAGNOSIS — L03031 Cellulitis of right toe: Secondary | ICD-10-CM | POA: Diagnosis not present

## 2018-11-30 DIAGNOSIS — D631 Anemia in chronic kidney disease: Secondary | ICD-10-CM | POA: Diagnosis not present

## 2018-11-30 DIAGNOSIS — Z881 Allergy status to other antibiotic agents status: Secondary | ICD-10-CM | POA: Diagnosis not present

## 2018-11-30 DIAGNOSIS — Z882 Allergy status to sulfonamides status: Secondary | ICD-10-CM | POA: Diagnosis not present

## 2018-11-30 DIAGNOSIS — Z888 Allergy status to other drugs, medicaments and biological substances status: Secondary | ICD-10-CM | POA: Diagnosis not present

## 2018-11-30 DIAGNOSIS — R3129 Other microscopic hematuria: Secondary | ICD-10-CM | POA: Diagnosis not present

## 2018-11-30 DIAGNOSIS — Z880 Allergy status to penicillin: Secondary | ICD-10-CM | POA: Diagnosis not present

## 2018-11-30 DIAGNOSIS — G4733 Obstructive sleep apnea (adult) (pediatric): Secondary | ICD-10-CM | POA: Diagnosis not present

## 2018-11-30 DIAGNOSIS — N183 Chronic kidney disease, stage 3 (moderate): Secondary | ICD-10-CM | POA: Diagnosis not present

## 2018-11-30 DIAGNOSIS — I129 Hypertensive chronic kidney disease with stage 1 through stage 4 chronic kidney disease, or unspecified chronic kidney disease: Secondary | ICD-10-CM | POA: Diagnosis not present

## 2018-11-30 DIAGNOSIS — E8779 Other fluid overload: Secondary | ICD-10-CM | POA: Diagnosis not present

## 2018-11-30 DIAGNOSIS — B351 Tinea unguium: Secondary | ICD-10-CM | POA: Diagnosis not present

## 2018-11-30 DIAGNOSIS — E1122 Type 2 diabetes mellitus with diabetic chronic kidney disease: Secondary | ICD-10-CM | POA: Diagnosis not present

## 2018-11-30 DIAGNOSIS — Z885 Allergy status to narcotic agent status: Secondary | ICD-10-CM | POA: Diagnosis not present

## 2018-11-30 DIAGNOSIS — M109 Gout, unspecified: Secondary | ICD-10-CM | POA: Diagnosis not present

## 2018-11-30 DIAGNOSIS — R809 Proteinuria, unspecified: Secondary | ICD-10-CM | POA: Diagnosis not present

## 2018-11-30 DIAGNOSIS — Z7984 Long term (current) use of oral hypoglycemic drugs: Secondary | ICD-10-CM | POA: Diagnosis not present

## 2018-11-30 DIAGNOSIS — N2581 Secondary hyperparathyroidism of renal origin: Secondary | ICD-10-CM | POA: Diagnosis not present

## 2018-12-01 ENCOUNTER — Ambulatory Visit: Payer: Medicare Other | Admitting: Family Medicine

## 2018-12-05 ENCOUNTER — Ambulatory Visit: Payer: Medicare Other | Admitting: Family Medicine

## 2018-12-05 ENCOUNTER — Other Ambulatory Visit: Payer: Self-pay | Admitting: Physician Assistant

## 2018-12-05 DIAGNOSIS — Z0289 Encounter for other administrative examinations: Secondary | ICD-10-CM

## 2018-12-05 DIAGNOSIS — G8929 Other chronic pain: Secondary | ICD-10-CM

## 2018-12-05 DIAGNOSIS — M25561 Pain in right knee: Secondary | ICD-10-CM

## 2018-12-05 DIAGNOSIS — M25511 Pain in right shoulder: Secondary | ICD-10-CM

## 2018-12-05 DIAGNOSIS — M25562 Pain in left knee: Secondary | ICD-10-CM

## 2018-12-05 MED ORDER — HYDROCODONE-ACETAMINOPHEN 5-325 MG PO TABS: 1.0000 | ORAL_TABLET | Freq: Three times a day (TID) | ORAL | 0 refills | Status: DC | PRN

## 2018-12-05 NOTE — Telephone Encounter (Signed)
Hydrocodone refilled Prednisone declined. That is not an indication for a steroid.

## 2018-12-05 NOTE — Telephone Encounter (Signed)
Pt called clinic for Hydrocodone refill. Last refill 11/06/18.  Pt also request Rx for Prednisone. States the rainy cold weather is making her feet hurt. Reports she had to cancel her last appt because her niece died, then her sister in law died so she has 2 funerals this week.   Routing.

## 2018-12-06 NOTE — Telephone Encounter (Signed)
Pt advised.

## 2018-12-11 ENCOUNTER — Ambulatory Visit: Payer: Medicare Other | Admitting: Physician Assistant

## 2018-12-14 ENCOUNTER — Telehealth: Payer: Self-pay | Admitting: Physician Assistant

## 2018-12-14 DIAGNOSIS — M25562 Pain in left knee: Principal | ICD-10-CM

## 2018-12-14 DIAGNOSIS — M25561 Pain in right knee: Principal | ICD-10-CM

## 2018-12-14 DIAGNOSIS — G8929 Other chronic pain: Secondary | ICD-10-CM

## 2018-12-14 MED ORDER — PREDNISONE 50 MG PO TABS: ORAL_TABLET | ORAL | 0 refills | Status: DC

## 2018-12-14 NOTE — Telephone Encounter (Signed)
Sent!

## 2018-12-14 NOTE — Addendum Note (Signed)
Addended by: Donella Stade on: 12/14/2018 07:48 PM   Modules accepted: Orders

## 2018-12-14 NOTE — Telephone Encounter (Signed)
Pt requesting prednisone Rx refill. Routing.

## 2018-12-15 NOTE — Telephone Encounter (Signed)
Pt advised. She also requested refill on her xanax. Routing.

## 2018-12-18 ENCOUNTER — Ambulatory Visit (INDEPENDENT_AMBULATORY_CARE_PROVIDER_SITE_OTHER): Payer: Medicare Other | Admitting: Physician Assistant

## 2018-12-18 ENCOUNTER — Encounter: Payer: Self-pay | Admitting: Physician Assistant

## 2018-12-18 VITALS — BP 155/50 | HR 109 | Ht 60.0 in | Wt 310.0 lb

## 2018-12-18 DIAGNOSIS — L03116 Cellulitis of left lower limb: Secondary | ICD-10-CM

## 2018-12-18 DIAGNOSIS — R600 Localized edema: Secondary | ICD-10-CM

## 2018-12-18 DIAGNOSIS — F419 Anxiety disorder, unspecified: Secondary | ICD-10-CM | POA: Diagnosis not present

## 2018-12-18 DIAGNOSIS — L03115 Cellulitis of right lower limb: Secondary | ICD-10-CM

## 2018-12-18 DIAGNOSIS — E119 Type 2 diabetes mellitus without complications: Secondary | ICD-10-CM | POA: Diagnosis not present

## 2018-12-18 DIAGNOSIS — I10 Essential (primary) hypertension: Secondary | ICD-10-CM | POA: Diagnosis not present

## 2018-12-18 DIAGNOSIS — R350 Frequency of micturition: Secondary | ICD-10-CM | POA: Diagnosis not present

## 2018-12-18 LAB — POCT URINALYSIS DIPSTICK
Glucose, UA: NEGATIVE
Ketones, UA: NEGATIVE
Nitrite, UA: NEGATIVE
Protein, UA: POSITIVE — AB
Spec Grav, UA: 1.015 (ref 1.010–1.025)
Urobilinogen, UA: 0.2 E.U./dL
pH, UA: 5.5 (ref 5.0–8.0)

## 2018-12-18 MED ORDER — ALPRAZOLAM 0.5 MG PO TABS: 0.5000 mg | ORAL_TABLET | Freq: Every evening | ORAL | 2 refills | Status: DC | PRN

## 2018-12-18 MED ORDER — DOXYCYCLINE HYCLATE 100 MG PO TABS: 100.0000 mg | ORAL_TABLET | Freq: Two times a day (BID) | ORAL | 0 refills | Status: DC

## 2018-12-18 NOTE — Progress Notes (Signed)
Doxycycline should treat.
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
## 2018-12-19 DIAGNOSIS — M7989 Other specified soft tissue disorders: Secondary | ICD-10-CM | POA: Diagnosis not present

## 2018-12-19 DIAGNOSIS — R7989 Other specified abnormal findings of blood chemistry: Secondary | ICD-10-CM | POA: Diagnosis not present

## 2018-12-19 DIAGNOSIS — R0602 Shortness of breath: Secondary | ICD-10-CM | POA: Diagnosis not present

## 2018-12-19 DIAGNOSIS — D649 Anemia, unspecified: Secondary | ICD-10-CM | POA: Diagnosis not present

## 2018-12-19 DIAGNOSIS — M545 Low back pain: Secondary | ICD-10-CM | POA: Diagnosis not present

## 2018-12-19 DIAGNOSIS — R0600 Dyspnea, unspecified: Secondary | ICD-10-CM | POA: Diagnosis not present

## 2018-12-19 DIAGNOSIS — L98499 Non-pressure chronic ulcer of skin of other sites with unspecified severity: Secondary | ICD-10-CM | POA: Diagnosis not present

## 2018-12-19 DIAGNOSIS — I878 Other specified disorders of veins: Secondary | ICD-10-CM | POA: Diagnosis not present

## 2018-12-19 DIAGNOSIS — Z8639 Personal history of other endocrine, nutritional and metabolic disease: Secondary | ICD-10-CM | POA: Diagnosis not present

## 2018-12-19 LAB — URINE CULTURE
MICRO NUMBER:: 91533701
SPECIMEN QUALITY:: ADEQUATE

## 2018-12-20 DIAGNOSIS — R9431 Abnormal electrocardiogram [ECG] [EKG]: Secondary | ICD-10-CM | POA: Diagnosis not present

## 2018-12-20 DIAGNOSIS — R0600 Dyspnea, unspecified: Secondary | ICD-10-CM | POA: Diagnosis not present

## 2018-12-20 NOTE — Progress Notes (Signed)
Call pt: culture did not show any known bacteria to treat.

## 2018-12-21 ENCOUNTER — Other Ambulatory Visit: Payer: Self-pay

## 2018-12-21 MED ORDER — POTASSIUM CHLORIDE CRYS ER 20 MEQ PO TBCR: 10.0000 meq | EXTENDED_RELEASE_TABLET | Freq: Every day | ORAL | 1 refills | Status: DC

## 2018-12-21 MED ORDER — GLIPIZIDE 5 MG PO TABS: ORAL_TABLET | ORAL | 1 refills | Status: DC

## 2018-12-21 MED ORDER — METFORMIN HCL 1000 MG PO TABS: 1000.0000 mg | ORAL_TABLET | Freq: Two times a day (BID) | ORAL | 2 refills | Status: DC

## 2018-12-21 MED ORDER — FUROSEMIDE 40 MG PO TABS: 40.0000 mg | ORAL_TABLET | Freq: Two times a day (BID) | ORAL | 1 refills | Status: DC

## 2018-12-21 MED ORDER — "XEROFORM PETROLAT GAUZE 5""X9"" EX MISC": CUTANEOUS | 1 refills | Status: DC

## 2018-12-21 MED ORDER — LOSARTAN POTASSIUM 25 MG PO TABS: 25.0000 mg | ORAL_TABLET | Freq: Every day | ORAL | 1 refills | Status: DC

## 2019-01-05 ENCOUNTER — Ambulatory Visit: Payer: Medicare Other | Admitting: Family Medicine

## 2019-01-08 ENCOUNTER — Other Ambulatory Visit: Payer: Self-pay | Admitting: Physician Assistant

## 2019-01-08 ENCOUNTER — Ambulatory Visit: Payer: Medicare Other | Admitting: Family Medicine

## 2019-01-08 DIAGNOSIS — M25511 Pain in right shoulder: Principal | ICD-10-CM

## 2019-01-08 DIAGNOSIS — M25561 Pain in right knee: Secondary | ICD-10-CM

## 2019-01-08 DIAGNOSIS — M25562 Pain in left knee: Secondary | ICD-10-CM

## 2019-01-08 DIAGNOSIS — Z0289 Encounter for other administrative examinations: Secondary | ICD-10-CM

## 2019-01-08 DIAGNOSIS — G8929 Other chronic pain: Secondary | ICD-10-CM

## 2019-01-08 MED ORDER — HYDROCODONE-ACETAMINOPHEN 5-325 MG PO TABS: 1.0000 | ORAL_TABLET | Freq: Three times a day (TID) | ORAL | 0 refills | Status: DC | PRN

## 2019-01-09 ENCOUNTER — Ambulatory Visit: Payer: Medicare Other | Admitting: Family Medicine

## 2019-01-12 ENCOUNTER — Telehealth: Payer: Self-pay | Admitting: Physician Assistant

## 2019-01-12 DIAGNOSIS — M25562 Pain in left knee: Principal | ICD-10-CM

## 2019-01-12 DIAGNOSIS — G8929 Other chronic pain: Secondary | ICD-10-CM

## 2019-01-12 DIAGNOSIS — M25561 Pain in right knee: Principal | ICD-10-CM

## 2019-01-12 MED ORDER — PREDNISONE 50 MG PO TABS: ORAL_TABLET | ORAL | 0 refills | Status: DC

## 2019-01-12 NOTE — Telephone Encounter (Signed)
Left VM with status update.  

## 2019-01-12 NOTE — Telephone Encounter (Signed)
Pt called clinic requesting prednisone for her feet. Routing.

## 2019-01-12 NOTE — Telephone Encounter (Signed)
Done

## 2019-01-30 ENCOUNTER — Telehealth: Payer: Self-pay | Admitting: Physician Assistant

## 2019-01-30 DIAGNOSIS — M25562 Pain in left knee: Principal | ICD-10-CM

## 2019-01-30 DIAGNOSIS — M25561 Pain in right knee: Principal | ICD-10-CM

## 2019-01-30 DIAGNOSIS — G8929 Other chronic pain: Secondary | ICD-10-CM

## 2019-01-30 NOTE — Telephone Encounter (Signed)
Pt called clinic and requested prednisone, states she saw rain in the forecast.

## 2019-01-31 MED ORDER — PREDNISONE 50 MG PO TABS: ORAL_TABLET | ORAL | 0 refills | Status: DC

## 2019-01-31 NOTE — Telephone Encounter (Signed)
Done

## 2019-02-08 DIAGNOSIS — E538 Deficiency of other specified B group vitamins: Secondary | ICD-10-CM | POA: Diagnosis not present

## 2019-02-08 DIAGNOSIS — I10 Essential (primary) hypertension: Secondary | ICD-10-CM | POA: Diagnosis not present

## 2019-02-08 DIAGNOSIS — E119 Type 2 diabetes mellitus without complications: Secondary | ICD-10-CM | POA: Diagnosis not present

## 2019-02-09 ENCOUNTER — Telehealth: Payer: Self-pay | Admitting: Physician Assistant

## 2019-02-09 DIAGNOSIS — M25511 Pain in right shoulder: Principal | ICD-10-CM

## 2019-02-09 DIAGNOSIS — G8929 Other chronic pain: Secondary | ICD-10-CM

## 2019-02-09 DIAGNOSIS — Z0289 Encounter for other administrative examinations: Secondary | ICD-10-CM

## 2019-02-09 DIAGNOSIS — M25562 Pain in left knee: Secondary | ICD-10-CM

## 2019-02-09 DIAGNOSIS — M25561 Pain in right knee: Secondary | ICD-10-CM

## 2019-02-09 MED ORDER — HYDROCODONE-ACETAMINOPHEN 5-325 MG PO TABS: 1.0000 | ORAL_TABLET | Freq: Three times a day (TID) | ORAL | 0 refills | Status: DC | PRN

## 2019-02-09 NOTE — Telephone Encounter (Signed)
Pt called. She's requesting refill on her pain meds..  Thanks.

## 2019-02-09 NOTE — Telephone Encounter (Signed)
..  PDMP reviewed during this encounter.  There was no evidence of any fills of norco for this patient. How do we look into this.   Refilled for now.

## 2019-02-09 NOTE — Telephone Encounter (Signed)
Patient called today and is requesting a refill on her pain medications. I have pended medication if appropriate for refill. Thanks!

## 2019-02-13 NOTE — Telephone Encounter (Signed)
Forwarded to Angela

## 2019-02-14 DIAGNOSIS — I878 Other specified disorders of veins: Secondary | ICD-10-CM | POA: Diagnosis not present

## 2019-02-14 DIAGNOSIS — K5730 Diverticulosis of large intestine without perforation or abscess without bleeding: Secondary | ICD-10-CM | POA: Diagnosis not present

## 2019-02-14 DIAGNOSIS — D124 Benign neoplasm of descending colon: Secondary | ICD-10-CM | POA: Diagnosis not present

## 2019-02-14 DIAGNOSIS — K319 Disease of stomach and duodenum, unspecified: Secondary | ICD-10-CM | POA: Diagnosis not present

## 2019-02-14 DIAGNOSIS — D179 Benign lipomatous neoplasm, unspecified: Secondary | ICD-10-CM | POA: Diagnosis not present

## 2019-02-14 DIAGNOSIS — K635 Polyp of colon: Secondary | ICD-10-CM | POA: Diagnosis not present

## 2019-02-14 DIAGNOSIS — J302 Other seasonal allergic rhinitis: Secondary | ICD-10-CM | POA: Diagnosis not present

## 2019-02-14 DIAGNOSIS — Z1211 Encounter for screening for malignant neoplasm of colon: Secondary | ICD-10-CM | POA: Diagnosis not present

## 2019-02-14 DIAGNOSIS — M109 Gout, unspecified: Secondary | ICD-10-CM | POA: Diagnosis not present

## 2019-02-14 DIAGNOSIS — J449 Chronic obstructive pulmonary disease, unspecified: Secondary | ICD-10-CM | POA: Diagnosis not present

## 2019-02-14 DIAGNOSIS — R110 Nausea: Secondary | ICD-10-CM | POA: Diagnosis not present

## 2019-02-14 DIAGNOSIS — Z6841 Body Mass Index (BMI) 40.0 and over, adult: Secondary | ICD-10-CM | POA: Diagnosis not present

## 2019-02-14 DIAGNOSIS — K766 Portal hypertension: Secondary | ICD-10-CM | POA: Diagnosis not present

## 2019-02-14 DIAGNOSIS — I129 Hypertensive chronic kidney disease with stage 1 through stage 4 chronic kidney disease, or unspecified chronic kidney disease: Secondary | ICD-10-CM | POA: Diagnosis not present

## 2019-02-14 DIAGNOSIS — Z86718 Personal history of other venous thrombosis and embolism: Secondary | ICD-10-CM | POA: Diagnosis not present

## 2019-02-14 DIAGNOSIS — E785 Hyperlipidemia, unspecified: Secondary | ICD-10-CM | POA: Diagnosis not present

## 2019-02-14 DIAGNOSIS — K317 Polyp of stomach and duodenum: Secondary | ICD-10-CM | POA: Diagnosis not present

## 2019-02-14 DIAGNOSIS — F419 Anxiety disorder, unspecified: Secondary | ICD-10-CM | POA: Diagnosis not present

## 2019-02-14 DIAGNOSIS — E1122 Type 2 diabetes mellitus with diabetic chronic kidney disease: Secondary | ICD-10-CM | POA: Diagnosis not present

## 2019-02-14 DIAGNOSIS — K648 Other hemorrhoids: Secondary | ICD-10-CM | POA: Diagnosis not present

## 2019-02-14 DIAGNOSIS — D509 Iron deficiency anemia, unspecified: Secondary | ICD-10-CM | POA: Diagnosis not present

## 2019-02-14 DIAGNOSIS — K5900 Constipation, unspecified: Secondary | ICD-10-CM | POA: Diagnosis not present

## 2019-02-14 DIAGNOSIS — K644 Residual hemorrhoidal skin tags: Secondary | ICD-10-CM | POA: Diagnosis not present

## 2019-02-14 DIAGNOSIS — D126 Benign neoplasm of colon, unspecified: Secondary | ICD-10-CM | POA: Diagnosis not present

## 2019-02-14 DIAGNOSIS — Z86010 Personal history of colonic polyps: Secondary | ICD-10-CM | POA: Diagnosis not present

## 2019-02-14 DIAGNOSIS — K2950 Unspecified chronic gastritis without bleeding: Secondary | ICD-10-CM | POA: Diagnosis not present

## 2019-02-14 DIAGNOSIS — K3189 Other diseases of stomach and duodenum: Secondary | ICD-10-CM | POA: Diagnosis not present

## 2019-02-14 DIAGNOSIS — K7581 Nonalcoholic steatohepatitis (NASH): Secondary | ICD-10-CM | POA: Diagnosis not present

## 2019-02-14 DIAGNOSIS — N183 Chronic kidney disease, stage 3 (moderate): Secondary | ICD-10-CM | POA: Diagnosis not present

## 2019-02-14 DIAGNOSIS — Z7984 Long term (current) use of oral hypoglycemic drugs: Secondary | ICD-10-CM | POA: Diagnosis not present

## 2019-02-14 DIAGNOSIS — K7460 Unspecified cirrhosis of liver: Secondary | ICD-10-CM | POA: Diagnosis not present

## 2019-02-19 ENCOUNTER — Telehealth: Payer: Self-pay | Admitting: Physician Assistant

## 2019-02-19 DIAGNOSIS — M25562 Pain in left knee: Secondary | ICD-10-CM

## 2019-02-19 DIAGNOSIS — M21611 Bunion of right foot: Secondary | ICD-10-CM

## 2019-02-19 DIAGNOSIS — M2041 Other hammer toe(s) (acquired), right foot: Secondary | ICD-10-CM

## 2019-02-19 DIAGNOSIS — M25561 Pain in right knee: Secondary | ICD-10-CM

## 2019-02-19 DIAGNOSIS — M2042 Other hammer toe(s) (acquired), left foot: Secondary | ICD-10-CM

## 2019-02-19 DIAGNOSIS — G8929 Other chronic pain: Secondary | ICD-10-CM

## 2019-02-19 NOTE — Telephone Encounter (Signed)
In the past I have asked you about prednisone burst in this patient as needed for her joint pain. You said ok at the time. She is not requesting burst of prednisone once and sometimes twice a month. She says it is the only thing that gets her through the day and allows her to walk. I almost would rather do a low dose daily than the burst what are your thoughts?

## 2019-02-19 NOTE — Telephone Encounter (Signed)
Pt called clinic requesting prednisone. Routing.

## 2019-02-20 MED ORDER — PREDNISONE 50 MG PO TABS: ORAL_TABLET | ORAL | 0 refills | Status: DC

## 2019-02-20 NOTE — Telephone Encounter (Signed)
I sent burst. When was the last time she saw anyone for her knees. I think we need to see if there is anything ortho could do to help with pain besides the bursts of prednisone. If she would like referral let me know. I know she sees a lot of providers in the [facility].

## 2019-02-20 NOTE — Addendum Note (Signed)
Addended by: Lavon Paganini on: 02/20/2019 08:46 AM   Modules accepted: Orders

## 2019-02-20 NOTE — Addendum Note (Signed)
Addended by: Donella Stade on: 02/20/2019 10:31 AM   Modules accepted: Orders

## 2019-02-20 NOTE — Telephone Encounter (Signed)
Left VM for Pt to return clinic call.  

## 2019-02-20 NOTE — Telephone Encounter (Signed)
Ok placed.

## 2019-02-20 NOTE — Telephone Encounter (Signed)
I think a short course of prednisone is probably better than daily prednisone at a low dose.  She does not have a lot of options to control her pain however she is not a candidate for total knee replacement.  She may be a candidate for that cool leaf procedure to ablate the geniculate nerve in the knee however not sure she is a good candidate for that either however.

## 2019-02-20 NOTE — Telephone Encounter (Signed)
Pt returned clinic call.   She reports she takes so much prednisone because of her bilateral foot pain. She suffers from bunions and bilateral hammer toes which makes it hard to walk and even had to go up a shoe size to give herself some form of comfort. She states she see's Dr Wardell Honour with [facility] but he says "there's nothing he can do to help her." She is open to a second opinion and saw a commercial for Dr. [facility] on TV. She would like to go there.  Referral pended for PCP.

## 2019-02-20 NOTE — Telephone Encounter (Signed)
Routing response back to PCP for review

## 2019-02-23 DIAGNOSIS — L97822 Non-pressure chronic ulcer of other part of left lower leg with fat layer exposed: Secondary | ICD-10-CM | POA: Diagnosis not present

## 2019-02-23 DIAGNOSIS — L0390 Cellulitis, unspecified: Secondary | ICD-10-CM | POA: Diagnosis not present

## 2019-02-23 DIAGNOSIS — L97812 Non-pressure chronic ulcer of other part of right lower leg with fat layer exposed: Secondary | ICD-10-CM | POA: Diagnosis not present

## 2019-02-23 DIAGNOSIS — L03119 Cellulitis of unspecified part of limb: Secondary | ICD-10-CM | POA: Diagnosis not present

## 2019-02-23 DIAGNOSIS — I87333 Chronic venous hypertension (idiopathic) with ulcer and inflammation of bilateral lower extremity: Secondary | ICD-10-CM | POA: Diagnosis not present

## 2019-02-23 DIAGNOSIS — I87332 Chronic venous hypertension (idiopathic) with ulcer and inflammation of left lower extremity: Secondary | ICD-10-CM | POA: Diagnosis not present

## 2019-02-23 DIAGNOSIS — Z96651 Presence of right artificial knee joint: Secondary | ICD-10-CM | POA: Diagnosis not present

## 2019-02-23 DIAGNOSIS — L97929 Non-pressure chronic ulcer of unspecified part of left lower leg with unspecified severity: Secondary | ICD-10-CM | POA: Diagnosis not present

## 2019-02-23 DIAGNOSIS — L97919 Non-pressure chronic ulcer of unspecified part of right lower leg with unspecified severity: Secondary | ICD-10-CM | POA: Diagnosis not present

## 2019-02-23 DIAGNOSIS — E119 Type 2 diabetes mellitus without complications: Secondary | ICD-10-CM | POA: Diagnosis not present

## 2019-02-23 DIAGNOSIS — E1169 Type 2 diabetes mellitus with other specified complication: Secondary | ICD-10-CM | POA: Diagnosis not present

## 2019-02-23 DIAGNOSIS — I87331 Chronic venous hypertension (idiopathic) with ulcer and inflammation of right lower extremity: Secondary | ICD-10-CM | POA: Diagnosis not present

## 2019-02-27 DIAGNOSIS — I87333 Chronic venous hypertension (idiopathic) with ulcer and inflammation of bilateral lower extremity: Secondary | ICD-10-CM | POA: Diagnosis not present

## 2019-02-27 DIAGNOSIS — I87331 Chronic venous hypertension (idiopathic) with ulcer and inflammation of right lower extremity: Secondary | ICD-10-CM | POA: Diagnosis not present

## 2019-02-27 DIAGNOSIS — L0390 Cellulitis, unspecified: Secondary | ICD-10-CM | POA: Diagnosis not present

## 2019-02-27 DIAGNOSIS — L97929 Non-pressure chronic ulcer of unspecified part of left lower leg with unspecified severity: Secondary | ICD-10-CM | POA: Diagnosis not present

## 2019-02-27 DIAGNOSIS — L97919 Non-pressure chronic ulcer of unspecified part of right lower leg with unspecified severity: Secondary | ICD-10-CM | POA: Diagnosis not present

## 2019-02-27 DIAGNOSIS — I87332 Chronic venous hypertension (idiopathic) with ulcer and inflammation of left lower extremity: Secondary | ICD-10-CM | POA: Diagnosis not present

## 2019-03-07 ENCOUNTER — Other Ambulatory Visit: Payer: Self-pay | Admitting: Physician Assistant

## 2019-03-07 DIAGNOSIS — Z0289 Encounter for other administrative examinations: Secondary | ICD-10-CM

## 2019-03-07 DIAGNOSIS — M25561 Pain in right knee: Secondary | ICD-10-CM

## 2019-03-07 DIAGNOSIS — M25511 Pain in right shoulder: Principal | ICD-10-CM

## 2019-03-07 DIAGNOSIS — G8929 Other chronic pain: Secondary | ICD-10-CM

## 2019-03-07 DIAGNOSIS — F419 Anxiety disorder, unspecified: Secondary | ICD-10-CM

## 2019-03-07 DIAGNOSIS — M25562 Pain in left knee: Secondary | ICD-10-CM

## 2019-03-07 MED ORDER — ALPRAZOLAM 0.5 MG PO TABS: 0.5000 mg | ORAL_TABLET | Freq: Every evening | ORAL | 0 refills | Status: DC | PRN

## 2019-03-07 MED ORDER — HYDROCODONE-ACETAMINOPHEN 5-325 MG PO TABS: 1.0000 | ORAL_TABLET | Freq: Three times a day (TID) | ORAL | 0 refills | Status: DC | PRN

## 2019-03-07 NOTE — Telephone Encounter (Signed)
Left VM with status update.  

## 2019-03-07 NOTE — Telephone Encounter (Signed)
Pt left me a voicemail requesting refill on her pain medication. Does advise it is not due until Friday, OK for dated Rx.   Pt then got tearful and asked for refill on xanax stating her "nerves are horrible." Routing.

## 2019-03-08 NOTE — Telephone Encounter (Signed)
[facility] has filled the 02/09/2019 Hydrocodone and patient did pick up prescription.

## 2019-03-09 NOTE — Telephone Encounter (Signed)
Thank you :)

## 2019-03-13 ENCOUNTER — Ambulatory Visit: Payer: Medicare Other | Admitting: Family Medicine

## 2019-03-15 ENCOUNTER — Ambulatory Visit: Payer: Medicare Other | Admitting: Family Medicine

## 2019-03-19 ENCOUNTER — Other Ambulatory Visit: Payer: Self-pay

## 2019-03-19 ENCOUNTER — Ambulatory Visit (INDEPENDENT_AMBULATORY_CARE_PROVIDER_SITE_OTHER): Payer: Medicare Other | Admitting: Family Medicine

## 2019-03-19 ENCOUNTER — Ambulatory Visit: Payer: Medicare Other | Admitting: Family Medicine

## 2019-03-19 ENCOUNTER — Ambulatory Visit (INDEPENDENT_AMBULATORY_CARE_PROVIDER_SITE_OTHER): Payer: Medicare Other

## 2019-03-19 ENCOUNTER — Encounter: Payer: Self-pay | Admitting: Family Medicine

## 2019-03-19 VITALS — BP 142/86 | HR 82 | Temp 98.0°F | Wt 310.0 lb

## 2019-03-19 DIAGNOSIS — R7989 Other specified abnormal findings of blood chemistry: Secondary | ICD-10-CM | POA: Diagnosis not present

## 2019-03-19 DIAGNOSIS — E781 Pure hyperglyceridemia: Secondary | ICD-10-CM

## 2019-03-19 DIAGNOSIS — M25521 Pain in right elbow: Secondary | ICD-10-CM

## 2019-03-19 DIAGNOSIS — M79674 Pain in right toe(s): Secondary | ICD-10-CM

## 2019-03-19 DIAGNOSIS — M25522 Pain in left elbow: Secondary | ICD-10-CM

## 2019-03-19 DIAGNOSIS — M25512 Pain in left shoulder: Secondary | ICD-10-CM | POA: Diagnosis not present

## 2019-03-19 DIAGNOSIS — M25571 Pain in right ankle and joints of right foot: Secondary | ICD-10-CM | POA: Diagnosis not present

## 2019-03-19 DIAGNOSIS — M25511 Pain in right shoulder: Secondary | ICD-10-CM

## 2019-03-19 DIAGNOSIS — M79671 Pain in right foot: Secondary | ICD-10-CM

## 2019-03-19 DIAGNOSIS — E119 Type 2 diabetes mellitus without complications: Secondary | ICD-10-CM

## 2019-03-19 MED ORDER — CEPHALEXIN 500 MG PO CAPS: 500.0000 mg | ORAL_CAPSULE | Freq: Two times a day (BID) | ORAL | 0 refills | Status: DC

## 2019-03-19 NOTE — Progress Notes (Signed)
[name] is a 68 y.o. female who presents to [facility] today for bilateral elbow and shoulder pain and right toe pain.  Symptoms have been ongoing for a few months but worsening recently.  She is had several falls most recently about 2 months ago.  She notes that she has pain in her elbows and shoulders bilaterally.  She notes pain in the bilateral upper arms worse with overhead motion.  She notes pain in bilateral elbows at the olecranon.  Additionally on her left side she has some pain radiating to her hand with numbness and tingling in the fingertips of her hand.  She notes all the fingertips are numb not just the ulnar side of her hand.  She has been trying massage and over-the-counter medications for pain which have not helped.  Additionally she notes pain in the right fourth toe.  She notes is painful and swollen.  She thinks she tripped and may have broken that toe recently.  She is not sure how long that has been.  Having UTI symptoms last few days.  She has burning with urination.  She does not have full control of her bladder usually and notes that she is not able to provide a urine sample.  She has her symptoms are consistent with previous episodes of UTI.  Additionally she thinks that she is due for basic blood work.  Is been sometime since she is had lipids and A1c drawn.  She is currently fasting.   ROS as above:  Exam:  BP (!) 142/86   Pulse 82   Temp 98 F (36.7 C) (Oral)   Wt (!) 310 lb (140.6 kg)   BMI 60.54 kg/m  Wt Readings from Last 5 Encounters:  03/19/19 (!) 310 lb (140.6 kg)  12/18/18 (!) 310 lb (140.6 kg)  11/15/18 (!) 302 lb (137 kg)  10/30/18 (!) 301 lb (136.5 kg)  10/24/18 (!) 301 lb (136.5 kg)    Gen: Well NAD morbidly obese HEENT: EOMI,  MMM Lungs: Normal work of breathing. CTABL Heart: RRR no MRG Abd: NABS, Soft.  Nondistended, Nontender Exts: Brisk capillary refill, warm and well perfused.  MSK: C-spine nontender normal motion. Right shoulder: Normal-appearing not particularly tender normal abduction. Positive Hawkins and Neer's test. Intact strength.  Left shoulder: Normal-appearing not particularly tender.  Normal abduction. Positive Hawkins and Neer's test. Intact strength.  Right elbow: Normal-appearing.  Tender to palpation at olecranon.  Positive Tinel's at cubital tunnel.  Normal motion normal strength.  Left elbow: Normal-appearing normal motion normal strength.  Tender palpation olecranon with positive Tinel's at cubital tunnel.  Hands bilaterally normal motion and strength.  Pulses capillary refill and sensation are intact.  Negative Tinel's carpal tunnel bilaterally.  Right foot: Fourth toe slightly erythematous and mildly tender.  No deformities present.  Sensation capillary fill are intact.  Lab and Radiology Results No results found for this or any previous visit (from the past 72 hour(s)). No results found.  X-ray images personally independently reviewed  Right shoulder: Status post subacromial decompression no acute fractures.  Left shoulder: Status post a subacromial decompression no acute fractures.  Right elbow: No acute fractures.  Mild degenerative changes present.  Left elbow: No acute fractures.  Mild degenerative changes present  Right foot no acute fractures.  Mild degenerative changes present.  Await formal radiology read.  Assessment and Plan: 68 y.o. female with  Shoulder pain bilaterally is likely rotator  cuff tendinitis.  Plan for home exercise program and recheck in the future.  Avoid steroid injection if possible as this will increase risk for complications with [id number].  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  Discussed night splint.  Watchful waiting recheck as needed.  Handout provided.  Toe  pain: Degenerative changes versus contusion or injury.  Plan for buddy tape and watchful waiting.  UTI symptoms: Unable to test.  Empiric treatment with Keflex.  Patient is not allergic to it.  Renal function.  Basic labs: We will check metabolic panel CBC and lipid panel and A1c.  These did not been checked in some time and patient is likely due for recheck.  I spent 40 minutes with this patient, greater than 50% was face-to-face time counseling regarding ddx and plan.   PDMP not reviewed this encounter. Orders Placed This Encounter  Procedures  . DG Elbow Complete Left    Standing Status:   Future    Standing Expiration Date:   05/18/2020    Order Specific Question:   Reason for Exam (SYMPTOM  OR DIAGNOSIS REQUIRED)    Answer:   elbow pain after fall    Order Specific Question:   Preferred imaging location?    Answer:   [facility]    Order Specific Question:   Radiology Contrast Protocol - do NOT remove file path    Answer:   \\[email or web address]s.[email or web address]  . DG Elbow Complete Right    Standing Status:   Future    Standing Expiration Date:   05/18/2020    Order Specific Question:   Reason for Exam (SYMPTOM  OR DIAGNOSIS REQUIRED)    Answer:   elbow pain after fall    Order Specific Question:   Preferred imaging location?    Answer:   [facility]    Order Specific Question:   Radiology Contrast Protocol - do NOT remove file path    Answer:   \\[email or web address]s.[email or web address]  . DG Shoulder Left    Standing Status:   Future    Standing Expiration Date:   05/18/2020    Order Specific Question:   Reason for Exam (SYMPTOM  OR DIAGNOSIS REQUIRED)    Answer:   shoulder pain after fall    Order Specific Question:   Preferred imaging location?    Answer:   [facility]    Order Specific Question:   Radiology Contrast Protocol - do NOT remove file path    Answer:    \\[email or web address]s.[email or web address]  . DG Shoulder Right    Standing Status:   Future    Standing Expiration Date:   05/18/2020    Order Specific Question:   Reason for Exam (SYMPTOM  OR DIAGNOSIS REQUIRED)    Answer:   shoulder pain after fall    Order Specific Question:   Preferred imaging location?    Answer:   [facility]    Order Specific Question:   Radiology Contrast Protocol - do NOT remove file path    Answer:   \\[email or web address]s.[email or web address]  . DG Foot Complete Right    Standing Status:   Future    Standing Expiration Date:   05/18/2020    Order Specific Question:   Reason for Exam (SYMPTOM  OR DIAGNOSIS REQUIRED)    Answer:   eval 4th toe pain    Order Specific Question:   Preferred imaging location?    Answer:   [facility]    Order Specific Question:  Radiology Contrast Protocol - do NOT remove file path    Answer:   \\[email or web address]\[email or web address]s.pdf   No orders of the defined types were placed in this encounter.    Historical information moved to improve visibility of documentation.  Past Medical History:  Diagnosis Date  . Anemia    hx of low iron  . Anxiety   . Arthritis   . Cataracts, bilateral   . Chronic kidney disease    nephrologist  Dr. Gillian Shields [facility], told pt. she is stage III  . Diabetes ([place])   . Family history of coronary artery disease   . Fatty liver   . GERD (gastroesophageal reflux disease)   . Headache   . Morbid obesity ([place])   . Peripheral vascular disease ([place])   . PONV (postoperative nausea and vomiting)   . Pulmonary embolism ([place]) 2001  . Restless leg syndrome   . Shortness of breath dyspnea   . Sleep apnea    can't wear her cpap due to nose being sore  . Venous stasis dermatitis of both lower extremities    Past Surgical History:  Procedure Laterality Date  . ABDOMINAL HYSTERECTOMY    . APPENDECTOMY    . BUNIONECTOMY  Right 01/28/2015   Procedure: Lillard Anes;  Surgeon: Hessie Dibble, MD;  Location: [facility];  Service: Orthopedics;  Laterality: Right;  . CARDIAC CATHETERIZATION  2014  . CHOLECYSTECTOMY    . COLONOSCOPY    . COLONOSCOPY    . FRACTURE SURGERY Left    shoulder  . HERNIA REPAIR     umbilical hernia  . JOINT REPLACEMENT Right 2001   knee  . PANNICULECTOMY    . SHOULDER ARTHROSCOPY Right 02/24/2016   Procedure: RIGHT SHOULDER ARTHROSCOPY, ACROMIOPLASTY, DISTAL CLAVICLE RESECTION, DEBRIDEMENT;  Surgeon: Melrose Nakayama, MD;  Location: [facility];  Service: Orthopedics;  Laterality: Right;  . TENNIS ELBOW RELEASE/NIRSCHEL PROCEDURE Right 02/24/2016   Procedure: RIGHT ELBOW LATERAL REPAIR;  Surgeon: Melrose Nakayama, MD;  Location: [facility];  Service: Orthopedics;  Laterality: Right;   Social History   Tobacco Use  . Smoking status: Never Smoker  . Smokeless tobacco: Never Used  Substance Use Topics  . Alcohol use: No   family history includes Cancer in her maternal grandfather and maternal grandmother; Crohn's disease in her sister; Diabetes in her mother; Heart attack in her father and mother; Heart failure in her sister; Hyperlipidemia in her father and mother; Hypertension in her father, mother, and sister; Parkinsonism in her brother; Renal Disease in her sister; Stroke in her paternal grandfather; Thyroid disease in her sister.  Medications: Current Outpatient Medications  Medication Sig Dispense Refill  . acetaminophen (TYLENOL) 500 MG tablet Take 1,000 mg by mouth.    [name] Kitchen albuterol (PROVENTIL HFA;VENTOLIN HFA) 108 (90 Base) MCG/ACT inhaler Inhale 1-2 puffs into the lungs every 6 (six) hours as needed for wheezing or shortness of breath. 1 Inhaler 5  . ALPRAZolam (XANAX) 0.5 MG tablet Take 1 tablet (0.5 mg total) by mouth at bedtime as needed for anxiety. 15 tablet 0  . atorvastatin (LIPITOR) 40 MG tablet Take 1 tablet (40 mg total) by mouth daily. 90 tablet 1  . Bismuth Tribromoph-Petrolatum  (XEROFORM PETROLAT GAUZE 5"X9") MISC Apply on each leg to cover wounds. Re-apply daily or as needed. 15 each 1  . budesonide-formoterol (SYMBICORT) 160-4.5 MCG/ACT inhaler Inhale 2 puffs into the lungs 2 (two) times daily. 3 Inhaler 3  . citalopram (CELEXA) 40 MG tablet Take 1 tablet (40  mg total) by mouth daily. 90 tablet 1  . co-enzyme Q-10 50 MG capsule Take 50 mg by mouth daily.    . cyclobenzaprine (FLEXERIL) 10 MG tablet Take 1 tablet (10 mg total) by mouth 3 (three) times daily as needed for muscle spasms. 30 tablet 0  . doxycycline (VIBRA-TABS) 100 MG tablet Take 1 tablet (100 mg total) by mouth 2 (two) times daily. 20 tablet 0  . DULoxetine (CYMBALTA) 60 MG capsule 2 po daily 180 capsule 1  . furosemide (LASIX) 40 MG tablet Take 1 tablet (40 mg total) by mouth 2 (two) times daily. 180 tablet 1  . glipiZIDE (GLUCOTROL) 5 MG tablet TAKE ONE TABLET BY MOUTH ONCE DAILY BEFORE BREAKFAST 90 tablet 1  . glucose blood (ONE TOUCH ULTRA TEST) test strip Check fasting blood sugar every am and random 2 hours after 1 meal daily. DM E11.9 200 each 5  . HYDROcodone-acetaminophen (NORCO/VICODIN) 5-325 MG tablet Take 1 tablet by mouth every 8 (eight) hours as needed for moderate pain. 60 tablet 0  . Lancets (ONETOUCH ULTRASOFT) lancets Check fasting blood sugar every am and random 2 hours after 1 meal daily. DM E11.9 200 each 5  . losartan (COZAAR) 25 MG tablet Take 1 tablet (25 mg total) by mouth daily. 90 tablet 1  . metFORMIN (GLUCOPHAGE) 1000 MG tablet Take 1 tablet (1,000 mg total) by mouth 2 (two) times daily with a meal. 180 tablet 2  . Multiple Vitamins-Minerals (OCUVITE PO) Take 1 tablet by mouth daily.    . potassium chloride SA (K-DUR,KLOR-CON) 20 MEQ tablet Take 0.5 tablets (10 mEq total) by mouth daily. 120 tablet 1  . predniSONE (DELTASONE) 50 MG tablet One tab PO daily for 5 days. 5 tablet 0  . promethazine (PHENERGAN) 25 MG tablet Take 1 tablet (25 mg total) by mouth every 6 (six) hours as  needed for nausea or vomiting. 30 tablet 0  . spironolactone (ALDACTONE) 25 MG tablet Take 1 tablet (25 mg total) by mouth daily. 90 tablet 1  . Vitamin D, Ergocalciferol, (DRISDOL) 1.25 MG (50000 UT) CAPS capsule Take 1 capsule (50,000 Units total) by mouth once a week. 12 capsule 1   No current facility-administered medications for this visit.    Allergies  Allergen Reactions  . Amlodipine Itching and Rash  . Lovastatin Itching  . Sulfa Antibiotics Itching  . Sulfacetamide Sodium Itching  . Sulfamethoxazole Itching     Discussed warning signs or symptoms. Please see discharge instructions. Patient expresses understanding.

## 2019-03-19 NOTE — Patient Instructions (Addendum)
Thank you for coming in today.  I think you have cubital tunnel syndrome.  Use the night elbow splint.  Recheck in 1 month over phone or in person.   For the shoulders I think you have some rotator cuff tendonitis.  Work on the shoulder exercises.   For the toe tape the 4th and 5th toes together.   Take the keflex for UTI.   Let me know how you are doing.     Cubital Tunnel Syndrome  Cubital tunnel syndrome is a condition that causes pain and weakness of the forearm and hand. It happens when one of the nerves that runs along the inside of the elbow joint (ulnar nerve) becomes irritated. This condition is usually caused by repeated arm motions that are done during sports or work-related activities. What are the causes? This condition may be caused by:  Increased pressure on the ulnar nerve at the elbow, arm, or forearm. This can result from: ? Irritation caused by repeated elbow bending. ? Poorly healed elbow fractures. ? Tumors in the elbow. These are usually noncancerous (benign). ? Scar tissue that develops in the elbow after an injury. ? Bony growths (spurs) near the ulnar nerve.  Stretching of the nerve due to loose elbow ligaments.  Trauma to the nerve at the elbow. What increases the risk? The following factors may make you more likely to develop this condition:  Doing manual labor that requires frequent bending of the elbow.  Playing sports that include repeated or strenuous throwing motions, such as baseball.  Playing contact sports, such as football or lacrosse.  Not warming up properly before activities.  Having diabetes.  Having an underactive thyroid (hypothyroidism). What are the signs or symptoms? Symptoms of this condition include:  Clumsiness or weakness of the hand.  Tenderness of the inner elbow.  Aching or soreness of the inner elbow, forearm, or fingers, especially the little finger or the ring finger.  Increased pain when forcing the elbow  to bend.  Reduced control when throwing objects.  Tingling, numbness, or a burning feeling inside the forearm or in part of the hand or fingers, especially the little finger or the ring finger.  Sharp pains that shoot from the elbow down to the wrist and hand.  The inability to grip or pinch hard. How is this diagnosed? This condition is diagnosed based on:  Your symptoms and medical history. Your health care provider will also ask for details about any injury.  A physical exam. You may also have tests, including:  Electromyogram (EMG). This test measures electrical signals sent by your nerves into the muscles.  Nerve conduction study. This test measures how well electrical signals pass through your nerves.  Imaging tests, such as X-rays, ultrasound, and MRI. These tests check for possible causes of your condition. How is this treated? This condition may be treated by:  Stopping the activities that are causing your symptoms to get worse.  Icing and taking medicines to reduce pain and swelling.  Wearing a splint to prevent your elbow from bending, or wearing an elbow pad where the ulnar nerve is closest to the skin.  Working with a physical therapist in less severe cases. This may help to: ? Decrease your symptoms. ? Improve the strength and range of motion of your elbow, forearm, and hand. If these treatments do not help, surgery may be needed. Follow these instructions at home: If you have a splint:  Wear the splint as told by your health care  provider. Remove it only as told by your health care provider.  Loosen the splint if your fingers tingle, become numb, or turn cold and blue.  Keep the splint clean.  If the splint is not waterproof: ? Do not let it get wet. ? Cover it with a watertight covering when you take a bath or shower. Managing pain, stiffness, and swelling   If directed, put ice on the injured area: ? Put ice in a plastic bag. ? Place a towel  between your skin and the bag. ? Leave the ice on for 20 minutes, 2-3 times a day.  Move your fingers often to avoid stiffness and to lessen swelling.  Raise (elevate) the injured area above the level of your heart while you are sitting or lying down. General instructions  Take over-the-counter and prescription medicines only as told by your health care provider.  Do any exercise or physical therapy as told by your health care provider.  Do not drive or use heavy machinery while taking prescription pain medicine.  If you were given an elbow pad, wear it as told by your health care provider.  Keep all follow-up visits as told by your health care provider. This is important. Contact a health care provider if:  Your symptoms get worse.  Your symptoms do not get better with treatment.  You have new pain.  Your hand on the injured side feels numb or cold. Summary  Cubital tunnel syndrome is a condition that causes pain and weakness of the forearm and hand.  You are more likely to develop this condition if you do work or play sports that involve repeated arm movements.  This condition is often treated by stopping repetitive activities, applying ice, and using anti-inflammatory medicines.  In rare cases, surgery may be needed. This information is not intended to replace advice given to you by your health care provider. Make sure you discuss any questions you have with your health care provider. Document Released: 12/13/2005 Document Revised: 05/01/2018 Document Reviewed: 05/01/2018 Elsevier Interactive Patient Education  2019 [employer].

## 2019-03-20 ENCOUNTER — Ambulatory Visit (INDEPENDENT_AMBULATORY_CARE_PROVIDER_SITE_OTHER): Payer: Medicare Other | Admitting: Physician Assistant

## 2019-03-20 DIAGNOSIS — M8949 Other hypertrophic osteoarthropathy, multiple sites: Secondary | ICD-10-CM

## 2019-03-20 DIAGNOSIS — E119 Type 2 diabetes mellitus without complications: Secondary | ICD-10-CM

## 2019-03-20 DIAGNOSIS — N183 Chronic kidney disease, stage 3 unspecified: Secondary | ICD-10-CM

## 2019-03-20 DIAGNOSIS — I10 Essential (primary) hypertension: Secondary | ICD-10-CM | POA: Diagnosis not present

## 2019-03-20 DIAGNOSIS — M150 Primary generalized (osteo)arthritis: Secondary | ICD-10-CM

## 2019-03-20 DIAGNOSIS — G8929 Other chronic pain: Secondary | ICD-10-CM

## 2019-03-20 DIAGNOSIS — M25561 Pain in right knee: Secondary | ICD-10-CM

## 2019-03-20 DIAGNOSIS — L299 Pruritus, unspecified: Secondary | ICD-10-CM

## 2019-03-20 DIAGNOSIS — F419 Anxiety disorder, unspecified: Secondary | ICD-10-CM

## 2019-03-20 DIAGNOSIS — E1122 Type 2 diabetes mellitus with diabetic chronic kidney disease: Secondary | ICD-10-CM

## 2019-03-20 DIAGNOSIS — F331 Major depressive disorder, recurrent, moderate: Secondary | ICD-10-CM

## 2019-03-20 DIAGNOSIS — M2041 Other hammer toe(s) (acquired), right foot: Secondary | ICD-10-CM

## 2019-03-20 DIAGNOSIS — M2042 Other hammer toe(s) (acquired), left foot: Secondary | ICD-10-CM

## 2019-03-20 DIAGNOSIS — M159 Polyosteoarthritis, unspecified: Secondary | ICD-10-CM

## 2019-03-20 DIAGNOSIS — D631 Anemia in chronic kidney disease: Secondary | ICD-10-CM

## 2019-03-20 DIAGNOSIS — M25562 Pain in left knee: Secondary | ICD-10-CM

## 2019-03-20 DIAGNOSIS — M25511 Pain in right shoulder: Secondary | ICD-10-CM

## 2019-03-20 DIAGNOSIS — G479 Sleep disorder, unspecified: Secondary | ICD-10-CM

## 2019-03-20 LAB — CBC
HCT: 28.4 % — ABNORMAL LOW (ref 35.0–45.0)
Hemoglobin: 9.3 g/dL — ABNORMAL LOW (ref 11.7–15.5)
MCH: 29 pg (ref 27.0–33.0)
MCHC: 32.7 g/dL (ref 32.0–36.0)
MCV: 88.5 fL (ref 80.0–100.0)
MPV: 10.7 fL (ref 7.5–12.5)
Platelets: 216 10*3/uL (ref 140–400)
RBC: 3.21 10*6/uL — ABNORMAL LOW (ref 3.80–5.10)
RDW: 14.5 % (ref 11.0–15.0)
WBC: 9.6 10*3/uL (ref 3.8–10.8)

## 2019-03-20 LAB — COMPLETE METABOLIC PANEL WITH GFR
AG Ratio: 1.2 (calc) (ref 1.0–2.5)
ALT: 10 U/L (ref 6–29)
AST: 12 U/L (ref 10–35)
Albumin: 3.6 g/dL (ref 3.6–5.1)
Alkaline phosphatase (APISO): 76 U/L (ref 37–153)
BUN/Creatinine Ratio: 23 (calc) — ABNORMAL HIGH (ref 6–22)
BUN: 33 mg/dL — ABNORMAL HIGH (ref 7–25)
CO2: 28 mmol/L (ref 20–32)
Calcium: 8.9 mg/dL (ref 8.6–10.4)
Chloride: 102 mmol/L (ref 98–110)
Creat: 1.42 mg/dL — ABNORMAL HIGH (ref 0.50–0.99)
GFR, Est African American: 44 mL/min/{1.73_m2} — ABNORMAL LOW (ref >=60)
GFR, Est Non African American: 38 mL/min/{1.73_m2} — ABNORMAL LOW (ref >=60)
Globulin: 3.1 g/dL (calc) (ref 1.9–3.7)
Glucose, Bld: 72 mg/dL (ref 65–99)
Potassium: 4.3 mmol/L (ref 3.5–5.3)
Sodium: 139 mmol/L (ref 135–146)
Total Bilirubin: 0.3 mg/dL (ref 0.2–1.2)
Total Protein: 6.7 g/dL (ref 6.1–8.1)

## 2019-03-20 LAB — LIPID PANEL W/REFLEX DIRECT LDL
Cholesterol: 227 mg/dL — ABNORMAL HIGH (ref <200)
HDL: 41 mg/dL — ABNORMAL LOW (ref >=50)
LDL Cholesterol (Calc): 133 mg/dL (calc) — ABNORMAL HIGH
Non-HDL Cholesterol (Calc): 186 mg/dL (calc) — ABNORMAL HIGH (ref <130)
Total CHOL/HDL Ratio: 5.5 (calc) — ABNORMAL HIGH (ref <5.0)
Triglycerides: 372 mg/dL — ABNORMAL HIGH (ref <150)

## 2019-03-20 LAB — HEMOGLOBIN A1C
Hgb A1c MFr Bld: 5.7 % of total Hgb — ABNORMAL HIGH (ref <5.7)
Mean Plasma Glucose: 117 (calc)
eAG (mmol/L): 6.5 (calc)

## 2019-03-20 MED ORDER — PITAVASTATIN CALCIUM 4 MG PO TABS: 1.0000 | ORAL_TABLET | Freq: Every day | ORAL | 1 refills | Status: DC

## 2019-03-20 MED ORDER — FERROUS SULFATE 325 (65 FE) MG PO TBEC: 325.0000 mg | DELAYED_RELEASE_TABLET | Freq: Three times a day (TID) | ORAL | 5 refills | Status: DC

## 2019-03-20 MED ORDER — HYDROXYZINE HCL 10 MG PO TABS: 10.0000 mg | ORAL_TABLET | Freq: Three times a day (TID) | ORAL | 0 refills | Status: DC | PRN

## 2019-03-20 MED ORDER — QUETIAPINE FUMARATE 25 MG PO TABS: 25.0000 mg | ORAL_TABLET | Freq: Every day | ORAL | 0 refills | Status: DC

## 2019-03-20 NOTE — Progress Notes (Signed)
Reviewed with patient over virtual visit

## 2019-03-20 NOTE — Progress Notes (Signed)
I have a virtual visit with her today. I will go over them with her. Thanks.

## 2019-03-20 NOTE — Progress Notes (Signed)
.Virtual Visit via Telephone Note  I connected with [name] on 03/23/19 at  2:00 PM EDT by telephone and verified that I am speaking with the correct person using two identifiers.   I discussed the limitations, risks, security and privacy concerns of performing an evaluation and management service by telephone and the availability of in person appointments. I also discussed with the patient that there may be a patient responsible charge related to this service. The patient expressed understanding and agreed to proceed.  Pain clinic referral Change lipitor to livalo.  seroquel   History of Present Illness: Pt is a 68 yo female who is morbidly obese and in chronic pain with T2DM who calls in for 3 month follow up. She was seen by sports medicine in clinic yesterday for different pain. He ordered labs.   Overall she is frustrated with pain. She is on a pain contract and given norco twice a day. She does not feel like it is enough. She is not a surgical candidate due to her overall health for foot or knee surgery where most of her pain lies.   Will go over labs from yesterday.   She does report that she feels like lipitor makes her more achy. She would like to switch to something else.   She also is taking more xanax. She has 15 a month but wants to take one every night to "calm down" or when a family member "bugs her". She does not take with norco. She is on cymbalta and celexa. She is having trouble resting at night and sleeping very little. She feels itchy at night.   .. Active Ambulatory Problems    Diagnosis Date Noted  . History of pulmonary embolism 03/20/2014  . Type II diabetes mellitus, well controlled ([place]) 03/20/2014  . Morbidly obese ([place]) 03/20/2014  . Family history of heart disease 03/20/2014  . Bunion, right foot 03/20/2014  . Frequent headaches 07/20/2016  . Macular degeneration, dry 07/20/2016  . No energy 07/21/2016  . OSA on CPAP 07/21/2016  . Closed  fracture of nasal bone 07/21/2016  . Hyperlipidemia 07/21/2016  . Depression 07/21/2016  . Gastroesophageal reflux disease without esophagitis 07/21/2016  . Facial pain 07/21/2016  . Chronic bronchitis ([place]) 07/21/2016  . Bilateral lower extremity edema 07/21/2016  . B12 deficiency 07/21/2016  . Iron deficiency anemia 07/21/2016  . CKD (chronic kidney disease), stage III ([place]) 08/04/2016  . Insomnia 08/04/2016  . Hypertriglyceridemia 08/06/2016  . Diabetic polyneuropathy associated with diabetes mellitus due to underlying condition ([place]) 11/07/2016  . Vitamin D deficiency 11/07/2016  . Chronic right shoulder pain 02/06/2017  . RLS (restless legs syndrome) 02/06/2017  . Essential hypertension 02/06/2017  . Left hand pain 02/06/2017  . Post-menopausal 02/06/2017  . Anxiety 02/06/2017  . Osteopenia 03/01/2017  . Aortic atherosclerosis ([place]) 03/16/2017  . Liver nodule 03/25/2017  . Chronic venous stasis dermatitis of both lower extremities 03/25/2017  . Chronic pain of both knees 05/12/2017  . Pernicious anemia 08/29/2017  . Primary osteoarthritis involving multiple joints 11/25/2017  . Bilateral lower leg cellulitis 11/25/2017  . Abdominal pain, RLQ 11/25/2017  . Abdominal distension (gaseous) 11/25/2017  . Liver cirrhosis secondary to NASH ([place]) 03/21/2017  . Elevated fasting glucose 02/27/2018  . Adenomatous colon polyp 02/28/2018  . Seborrheic dermatitis 06/04/2018  . Hammer toes of both feet 06/05/2018  . Left breast mass 06/05/2018  . Cellulitis of right leg 06/05/2018  . Elevated serum creatinine 07/02/2018  . Hypomagnesemia 09/15/2018  .  Hyponatremia 09/15/2018   Resolved Ambulatory Problems    Diagnosis Date Noted  . Sweating profusely 08/29/2017  . Second degree burn of abdomen 02/27/2018  . Skin lesion 06/05/2018   Past Medical History:  Diagnosis Date  . Anemia   . Arthritis   . Cataracts, bilateral   . Chronic kidney disease   . Diabetes ([place])   . Family  history of coronary artery disease   . Fatty liver   . GERD (gastroesophageal reflux disease)   . Headache   . Morbid obesity ([place])   . Peripheral vascular disease ([place])   . PONV (postoperative nausea and vomiting)   . Pulmonary embolism ([place]) 2001  . Restless leg syndrome   . Shortness of breath dyspnea   . Sleep apnea   . Venous stasis dermatitis of both lower extremities     Observations/Objective: No acute distress  .[name] KitchenThere were no vitals filed for this visit. There is no height or weight on file to calculate BMI.    Assessment and Plan: [name] KitchenMarland KitchenDiagnoses and all orders for this visit:  Essential hypertension  Type II diabetes mellitus, well controlled ([place]) -     Pitavastatin Calcium 4 MG TABS; Take 1 tablet (4 mg total) by mouth daily.  CKD (chronic kidney disease), stage III (HCC)  Morbidly obese (HCC)  Hammer toes of both feet  Primary osteoarthritis involving multiple joints  Chronic right shoulder pain  Chronic pain of both knees  Anxiety -     QUEtiapine (SEROQUEL) 25 MG tablet; Take 1 tablet (25 mg total) by mouth at bedtime. -     hydrOXYzine (ATARAX/VISTARIL) 10 MG tablet; Take 1 tablet (10 mg total) by mouth 3 (three) times daily as needed for itching or anxiety.  Moderate episode of recurrent major depressive disorder (HCC)  Anemia due to stage 3 chronic kidney disease (HCC) -     ferrous sulfate 325 (65 FE) MG EC tablet; Take 1 tablet (325 mg total) by mouth 3 (three) times daily with meals.  Trouble in sleeping -     QUEtiapine (SEROQUEL) 25 MG tablet; Take 1 tablet (25 mg total) by mouth at bedtime.  Itching -     hydrOXYzine (ATARAX/VISTARIL) 10 MG tablet; Take 1 tablet (10 mg total) by mouth 3 (three) times daily as needed for itching or anxiety.   Went over labs.   Pt is more anemic. Added ferrous sulfate. Likely due to CKD and some IDA.   Switched lipitor for livalo due to her side effects. Concerned about cost but will wait and see.    Needs DM eye exam. A!C looks good.   Will refer to pain clinic to see if they can control her pain better.   Discussed xanax use and the interaction. I am not going to give more. I did give some vistaril to use at night for itching and anxiety. I did also start seroquel for sleep.   Follow up in 1 month.   Follow Up Instructions:    I discussed the assessment and treatment plan with the patient. The patient was provided an opportunity to ask questions and all were answered. The patient agreed with the plan and demonstrated an understanding of the instructions.   The patient was advised to call back or seek an in-person evaluation if the symptoms worsen or if the condition fails to improve as anticipated.  I provided 30 minutes of non-face-to-face time during this encounter.   Iran Planas, PA-C

## 2019-03-23 ENCOUNTER — Telehealth: Payer: Self-pay | Admitting: Physician Assistant

## 2019-03-23 ENCOUNTER — Encounter: Payer: Self-pay | Admitting: Physician Assistant

## 2019-03-23 DIAGNOSIS — R32 Unspecified urinary incontinence: Secondary | ICD-10-CM

## 2019-03-23 NOTE — Telephone Encounter (Signed)
Pt wants Rx for adult incontinence panties sent to [facility] for bladder incontinence. This is not on her problem list.   Pended Rx for PCP to add diagnosis code.

## 2019-03-25 DIAGNOSIS — R32 Unspecified urinary incontinence: Secondary | ICD-10-CM | POA: Insufficient documentation

## 2019-03-25 MED ORDER — AMBULATORY NON FORMULARY MEDICATION: 99 refills | Status: DC

## 2019-03-25 MED ORDER — AMBULATORY NON FORMULARY MEDICATION: 11 refills | Status: DC

## 2019-03-25 NOTE — Telephone Encounter (Signed)
Done

## 2019-03-26 NOTE — Telephone Encounter (Signed)
Pt advised.

## 2019-04-04 ENCOUNTER — Telehealth: Payer: Self-pay | Admitting: Physician Assistant

## 2019-04-04 DIAGNOSIS — M25562 Pain in left knee: Secondary | ICD-10-CM

## 2019-04-04 DIAGNOSIS — M25561 Pain in right knee: Secondary | ICD-10-CM

## 2019-04-04 DIAGNOSIS — Z0289 Encounter for other administrative examinations: Secondary | ICD-10-CM

## 2019-04-04 DIAGNOSIS — F419 Anxiety disorder, unspecified: Secondary | ICD-10-CM

## 2019-04-04 DIAGNOSIS — G8929 Other chronic pain: Secondary | ICD-10-CM

## 2019-04-04 DIAGNOSIS — M25511 Pain in right shoulder: Secondary | ICD-10-CM

## 2019-04-04 MED ORDER — ALPRAZOLAM 0.5 MG PO TABS: 0.5000 mg | ORAL_TABLET | Freq: Every evening | ORAL | 0 refills | Status: DC | PRN

## 2019-04-04 MED ORDER — HYDROCODONE-ACETAMINOPHEN 5-325 MG PO TABS: 1.0000 | ORAL_TABLET | Freq: Three times a day (TID) | ORAL | 0 refills | Status: DC | PRN

## 2019-04-04 NOTE — Telephone Encounter (Signed)
Pt advised. She had not been contacted by the pain clinic. Did provide her with their information.

## 2019-04-04 NOTE — Telephone Encounter (Signed)
Sent for a little early 4/10.  Has she been called by pain clinic yet to set up anything?

## 2019-04-04 NOTE — Telephone Encounter (Signed)
Pt called. Requesting early refill on her Pain Rx and her xanax. Not due until next week. With the rain coming in her bones are hurting. Routing.

## 2019-04-19 ENCOUNTER — Telehealth: Payer: Self-pay | Admitting: Physician Assistant

## 2019-04-19 ENCOUNTER — Ambulatory Visit: Payer: Medicare Other | Admitting: Family Medicine

## 2019-04-19 NOTE — Telephone Encounter (Signed)
Pt left a message on my voice mail stating her feet were hurting so bad today. She states this has been going on for a couple of days. Pt stated that "Iran Planas usually will call in prednisone when they get to hurting real bad". Advised pt that [name] was not in office today and that could ask Dr. Georgina Snell. Pt states she uses [facility]

## 2019-04-20 ENCOUNTER — Ambulatory Visit (INDEPENDENT_AMBULATORY_CARE_PROVIDER_SITE_OTHER): Payer: Medicare Other | Admitting: Family Medicine

## 2019-04-20 VITALS — Temp 97.7°F | Ht 60.0 in | Wt 290.0 lb

## 2019-04-20 DIAGNOSIS — M79672 Pain in left foot: Secondary | ICD-10-CM

## 2019-04-20 DIAGNOSIS — M79671 Pain in right foot: Secondary | ICD-10-CM | POA: Diagnosis not present

## 2019-04-20 DIAGNOSIS — M79641 Pain in right hand: Secondary | ICD-10-CM

## 2019-04-20 DIAGNOSIS — M79642 Pain in left hand: Secondary | ICD-10-CM | POA: Diagnosis not present

## 2019-04-20 MED ORDER — PREDNISONE 20 MG PO TABS: 20.0000 mg | ORAL_TABLET | Freq: Every day | ORAL | 0 refills | Status: DC

## 2019-04-20 NOTE — Telephone Encounter (Signed)
Patient scheduled.

## 2019-04-20 NOTE — Progress Notes (Signed)
Virtual Visit  via Video Note  I connected with      [name] by a video enabled telemedicine application and verified that I am speaking with the correct person using two identifiers.   I discussed the limitations of evaluation and management by telemedicine and the availability of in person appointments. The patient expressed understanding and agreed to proceed.  History of Present Illness: [name] is a 68 y.o. female who would like to discuss foot pain.   [name] has a history of chronic foot and lower leg pain.  Her pain is thought to be due mainly to degenerative changes in her joints as a result of her morbid obesity.  However she also has a history of venous stasis dermatitis and occasionally cellulitis in her lower extremities.  In the past she notes that she has had benefit from courses of oral steroids.  She has been having plantar foot pain at the calcaneus and forefoot ongoing but worsening earlier this week.  She denies any injury.  She denies any skin erythema or heat.  She denies any discharge.  She has been trying over-the-counter medications for pain.  Additionally she has some hydrocodone that she can take for pain.  All of this is helped only a little.  She notes that she is hurting quite a bit.  She is scheduled an in person visit with me on April 30 but does not think that she can wait.  Additionally she notes that she is having hand pain and swelling that she attributes to degenerative changes and ganglion cyst.  She would like the cyst to be aspirated or drained if possible on during her upcoming visit.      Observations/Objective: Temp 97.7 F (36.5 C) (Oral)   Ht 5' (1.524 m)   Wt 290 lb (131.5 kg)   BMI 56.64 kg/m  Wt Readings from Last 5 Encounters:  04/20/19 290 lb (131.5 kg)  03/19/19 (!) 310 lb (140.6 kg)  12/18/18 (!) 310 lb (140.6 kg)  11/15/18 (!) 302 lb (137 kg)  10/30/18 (!) 301 lb (136.5 kg)   Exam: Appearance Normal Speech.   No acute distress nontoxic no shortness of breath.  Lab and Radiology Results No results found for this or any previous visit (from the past 72 hour(s)). No results found.   Assessment and Plan: 68 y.o. female with  Foot pain: Pain likely due to plantar fasciitis and possibly metatarsalgia.  Will benefit from an office visit and assessment however in the interim will use short course of oral prednisone.  She is benefited from this in the past.  Discussed risks including hyperglycemia and immune suppression.  Plan for short course of low-dose prednisone.  Reassess in office on April 30.  Hand pain: Possibly due to ganglion cyst.  Will reevaluate in office and proceed with aspiration if needed.  Meds ordered this encounter  Medications  . predniSONE (DELTASONE) 20 MG tablet    Sig: Take 1 tablet (20 mg total) by mouth daily with breakfast.    Dispense:  5 tablet    Refill:  0    Follow Up Instructions:    I discussed the assessment and treatment plan with the patient. The patient was provided an opportunity to ask questions and all were answered. The patient agreed with the plan and demonstrated an understanding of the instructions.   The patient was advised to call back or seek an in-person evaluation if the symptoms worsen or if  the condition fails to improve as anticipated.  Time: 15 minutes of intraservice time, with >22 minutes of total time during today's visit.      Historical information moved to improve visibility of documentation.  Past Medical History:  Diagnosis Date  . Anemia    hx of low iron  . Anxiety   . Arthritis   . Cataracts, bilateral   . Chronic kidney disease    nephrologist  Dr. Gillian Shields [facility], told pt. she is stage III  . Diabetes ([place])   . Family history of coronary artery disease   . Fatty liver   . GERD (gastroesophageal reflux disease)   . Headache   . Morbid obesity ([place])   . Peripheral vascular disease ([place])   . PONV  (postoperative nausea and vomiting)   . Pulmonary embolism ([facility]) 2001  . Restless leg syndrome   . Shortness of breath dyspnea   . Sleep apnea    can't wear her cpap due to nose being sore  . Venous stasis dermatitis of both lower extremities    Past Surgical History:  Procedure Laterality Date  . ABDOMINAL HYSTERECTOMY    . APPENDECTOMY    . BUNIONECTOMY Right 01/28/2015   Procedure: Lillard Anes;  Surgeon: Hessie Dibble, MD;  Location: [facility];  Service: Orthopedics;  Laterality: Right;  . CARDIAC CATHETERIZATION  2014  . CHOLECYSTECTOMY    . COLONOSCOPY    . COLONOSCOPY    . FRACTURE SURGERY Left    shoulder  . HERNIA REPAIR     umbilical hernia  . JOINT REPLACEMENT Right 2001   knee  . PANNICULECTOMY    . SHOULDER ARTHROSCOPY Right 02/24/2016   Procedure: RIGHT SHOULDER ARTHROSCOPY, ACROMIOPLASTY, DISTAL CLAVICLE RESECTION, DEBRIDEMENT;  Surgeon: Melrose Nakayama, MD;  Location: [facility];  Service: Orthopedics;  Laterality: Right;  . TENNIS ELBOW RELEASE/NIRSCHEL PROCEDURE Right 02/24/2016   Procedure: RIGHT ELBOW LATERAL REPAIR;  Surgeon: Melrose Nakayama, MD;  Location: [facility];  Service: Orthopedics;  Laterality: Right;   Social History   Tobacco Use  . Smoking status: Never Smoker  . Smokeless tobacco: Never Used  Substance Use Topics  . Alcohol use: No   family history includes Cancer in her maternal grandfather and maternal grandmother; Crohn's disease in her sister; Diabetes in her mother; Heart attack in her father and mother; Heart failure in her sister; Hyperlipidemia in her father and mother; Hypertension in her father, mother, and sister; Parkinsonism in her brother; Renal Disease in her sister; Stroke in her paternal grandfather; Thyroid disease in her sister.  Medications: Current Outpatient Medications  Medication Sig Dispense Refill  . acetaminophen (TYLENOL) 500 MG tablet Take 1,000 mg by mouth.    [name] Kitchen albuterol (PROVENTIL HFA;VENTOLIN HFA) 108 (90 Base) MCG/ACT  inhaler Inhale 1-2 puffs into the lungs every 6 (six) hours as needed for wheezing or shortness of breath. 1 Inhaler 5  . ALPRAZolam (XANAX) 0.5 MG tablet Take 1 tablet (0.5 mg total) by mouth at bedtime as needed for anxiety. 15 tablet 0  . AMBULATORY NON FORMULARY MEDICATION Adult incontinence panties for Adult incontinence R32. 100 each 11  . Bismuth Tribromoph-Petrolatum (XEROFORM PETROLAT GAUZE 5"X9") MISC Apply on each leg to cover wounds. Re-apply daily or as needed. 15 each 1  . budesonide-formoterol (SYMBICORT) 160-4.5 MCG/ACT inhaler Inhale 2 puffs into the lungs 2 (two) times daily. 3 Inhaler 3  . citalopram (CELEXA) 40 MG tablet Take 1 tablet (40 mg total) by mouth daily. 90 tablet  1  . co-enzyme Q-10 50 MG capsule Take 50 mg by mouth daily.    . cyclobenzaprine (FLEXERIL) 10 MG tablet Take 1 tablet (10 mg total) by mouth 3 (three) times daily as needed for muscle spasms. 30 tablet 0  . DULoxetine (CYMBALTA) 60 MG capsule 2 po daily 180 capsule 1  . ferrous sulfate 325 (65 FE) MG EC tablet Take 1 tablet (325 mg total) by mouth 3 (three) times daily with meals. 90 tablet 5  . furosemide (LASIX) 40 MG tablet Take 1 tablet (40 mg total) by mouth 2 (two) times daily. 180 tablet 1  . glipiZIDE (GLUCOTROL) 5 MG tablet TAKE ONE TABLET BY MOUTH ONCE DAILY BEFORE BREAKFAST 90 tablet 1  . glucose blood (ONE TOUCH ULTRA TEST) test strip Check fasting blood sugar every am and random 2 hours after 1 meal daily. DM E11.9 200 each 5  . HYDROcodone-acetaminophen (NORCO/VICODIN) 5-325 MG tablet Take 1 tablet by mouth every 8 (eight) hours as needed for moderate pain. 60 tablet 0  . hydrOXYzine (ATARAX/VISTARIL) 10 MG tablet Take 1 tablet (10 mg total) by mouth 3 (three) times daily as needed for itching or anxiety. 60 tablet 0  . Lancets (ONETOUCH ULTRASOFT) lancets Check fasting blood sugar every am and random 2 hours after 1 meal daily. DM E11.9 200 each 5  . losartan (COZAAR) 25 MG tablet Take 1 tablet  (25 mg total) by mouth daily. 90 tablet 1  . metFORMIN (GLUCOPHAGE) 1000 MG tablet Take 1 tablet (1,000 mg total) by mouth 2 (two) times daily with a meal. 180 tablet 2  . Multiple Vitamins-Minerals (OCUVITE PO) Take 1 tablet by mouth daily.    . Pitavastatin Calcium 4 MG TABS Take 1 tablet (4 mg total) by mouth daily. 90 tablet 1  . potassium chloride SA (K-DUR,KLOR-CON) 20 MEQ tablet Take 0.5 tablets (10 mEq total) by mouth daily. 120 tablet 1  . promethazine (PHENERGAN) 25 MG tablet Take 1 tablet (25 mg total) by mouth every 6 (six) hours as needed for nausea or vomiting. 30 tablet 0  . QUEtiapine (SEROQUEL) 25 MG tablet Take 1 tablet (25 mg total) by mouth at bedtime. 30 tablet 0  . spironolactone (ALDACTONE) 25 MG tablet Take 1 tablet (25 mg total) by mouth daily. 90 tablet 1  . Vitamin D, Ergocalciferol, (DRISDOL) 1.25 MG (50000 UT) CAPS capsule Take 1 capsule (50,000 Units total) by mouth once a week. 12 capsule 1  . predniSONE (DELTASONE) 20 MG tablet Take 1 tablet (20 mg total) by mouth daily with breakfast. 5 tablet 0   No current facility-administered medications for this visit.    Allergies  Allergen Reactions  . Amlodipine Itching and Rash  . Lovastatin Itching  . Sulfa Antibiotics Itching  . Sulfacetamide Sodium Itching  . Sulfamethoxazole Itching

## 2019-04-20 NOTE — Telephone Encounter (Signed)
If patient cannot wait until follow-up appoint with me on April 30 she can schedule virtual visit with me or Jade today.  Additionally she could probably get on Dr. Mcneil Sober schedule today as well to be seen in person.

## 2019-04-20 NOTE — Telephone Encounter (Signed)
Agree with [name]. Now that we have virtal capabilities she needs at least a phone call. Since dealing with ortho condition try to get in with [place] first.

## 2019-04-26 ENCOUNTER — Ambulatory Visit: Payer: Medicare Other | Admitting: Family Medicine

## 2019-04-30 ENCOUNTER — Ambulatory Visit: Payer: Medicare Other | Admitting: Family Medicine

## 2019-05-02 ENCOUNTER — Ambulatory Visit: Payer: Medicare Other | Admitting: Family Medicine

## 2019-05-03 ENCOUNTER — Ambulatory Visit (INDEPENDENT_AMBULATORY_CARE_PROVIDER_SITE_OTHER): Payer: Medicare Other | Admitting: Family Medicine

## 2019-05-03 ENCOUNTER — Other Ambulatory Visit: Payer: Self-pay

## 2019-05-03 ENCOUNTER — Encounter: Payer: Self-pay | Admitting: Family Medicine

## 2019-05-03 VITALS — BP 149/73 | HR 94 | Temp 98.3°F | Wt 296.0 lb

## 2019-05-03 DIAGNOSIS — M25561 Pain in right knee: Secondary | ICD-10-CM | POA: Diagnosis not present

## 2019-05-03 DIAGNOSIS — G8929 Other chronic pain: Secondary | ICD-10-CM | POA: Diagnosis not present

## 2019-05-03 DIAGNOSIS — M25511 Pain in right shoulder: Secondary | ICD-10-CM

## 2019-05-03 DIAGNOSIS — Z0289 Encounter for other administrative examinations: Secondary | ICD-10-CM | POA: Diagnosis not present

## 2019-05-03 DIAGNOSIS — M25562 Pain in left knee: Secondary | ICD-10-CM

## 2019-05-03 DIAGNOSIS — M7741 Metatarsalgia, right foot: Secondary | ICD-10-CM | POA: Diagnosis not present

## 2019-05-03 DIAGNOSIS — M25572 Pain in left ankle and joints of left foot: Secondary | ICD-10-CM

## 2019-05-03 DIAGNOSIS — M67431 Ganglion, right wrist: Secondary | ICD-10-CM | POA: Diagnosis not present

## 2019-05-03 DIAGNOSIS — I872 Venous insufficiency (chronic) (peripheral): Secondary | ICD-10-CM | POA: Diagnosis not present

## 2019-05-03 DIAGNOSIS — M7742 Metatarsalgia, left foot: Secondary | ICD-10-CM

## 2019-05-03 MED ORDER — HYDROCODONE-ACETAMINOPHEN 5-325 MG PO TABS: 1.0000 | ORAL_TABLET | Freq: Three times a day (TID) | ORAL | 0 refills | Status: DC | PRN

## 2019-05-03 NOTE — Patient Instructions (Addendum)
Thank you for coming in today. Call or go to the ER if you develop a large red swollen joint with extreme pain or oozing puss.  Recheck as needed for repeat injection.

## 2019-05-03 NOTE — Progress Notes (Signed)
[name] is a 68 y.o. female who presents to [facility] today for joint pain.  [name] has multiple musculoskeletal complaints today.  Right dorsal wrist: Patient notes a ganglion cyst on her right dorsal wrist that has been present for years and is slowly worsening.  She finds it somewhat uncomfortable in obnoxious.  She notes that she is developed some finger swelling and she is not sure if it is related or not.  Additionally she notes bilateral foot and ankle pain.  She has bilateral pain in the metatarsal heads of her plantar feet.  Additionally she notes bilateral ankle pain.  She notes that the left ankle pain seems to be worse.  She denies any new injury.  Additionally she notes continued bilateral knee pain due to DJD.  She notes that she has had work-up for this in the past and cannot have a total knee replacement due to her morbid obesity.  She does have chronic pain needs that is typically managed by her PCP my partner Iran Planas PA.  She also notes continued venous stasis dermatitis.  She has bilateral leg swelling and redness.  She uses Lasix which does help.  She does not use compression stockings.   ROS as above:  Exam:  BP (!) 149/73   Pulse 94   Temp 98.3 F (36.8 C) (Oral)   Wt 296 lb (134.3 kg)   BMI 57.81 kg/m  Wt Readings from Last 5 Encounters:  05/03/19 296 lb (134.3 kg)  04/20/19 290 lb (131.5 kg)  03/19/19 (!) 310 lb (140.6 kg)  12/18/18 (!) 310 lb (140.6 kg)  11/15/18 (!) 302 lb (137 kg)    Gen: Well NAD HEENT: EOMI,  MMM Lungs: Normal work of breathing. CTABL Heart: RRR no MRG Abd: NABS, Soft. Nondistended, Nontender Exts: Brisk capillary refill, warm and well perfused.  Right dorsal wrist: Slight palpable small not particularly tender cystic structure right dorsal wrist.  Normal wrist motion.  Right hand: Slight  swelling and mild synovitis second third and fourth PIP and MCP.  Normal hand motion.  Right foot and ankle: Moderate ankle effusion.  Normal ankle motion.  Foot largely normal-appearing.  Tender to palpation second third and fourth metatarsal heads  Left foot and ankle: Moderate ankle effusion.  Normal ankle motion.  Tender palpation around lateral malleolus joint line.  Tender palpation second third and fourth metatarsal heads.  Calves bilaterally are erythematous with 1+ edema.  No severe area of tenderness or fluctuance noted.   Lab and Radiology Results Limited musculoskeletal ultrasound right dorsal wrist ganglion cyst reveals a cystic structure measuring just under 1 cm in length and about 4 mm in height.  It is next to the fourth dorsal wrist compartment.  Does not seem to be associate with venous or nerve structures.  Left calf: Marbling appearance of the subcutaneous tissue consistent with edema.  No fluctuant areas noted.  Left ankle: Moderate effusion at the left lateral ankle joint.  Normal bony structures otherwise.  Procedure: Real-time Ultrasound Guided Injection of left lateral ankle joint Device: GE Logiq E   Images permanently stored and available for review in the ultrasound unit. Verbal informed consent obtained.  Discussed risks and benefits of procedure. Warned about infection bleeding damage to structures skin hypopigmentation and fat atrophy among others. Patient expresses understanding and agreement Time-out conducted.   Noted no overlying erythema, induration, or other signs of local infection.  Skin prepped in a sterile fashion.   Local anesthesia: Topical Ethyl chloride.   With sterile technique and under real time ultrasound guidance:  40 mg of Kenalog and 2 mL of lidocaine injected easily.   Completed without difficulty   Pain immediately resolved suggesting accurate placement of the medication.   Advised to call if fevers/chills, erythema, induration,  drainage, or persistent bleeding.   Images permanently stored and available for review in the ultrasound unit.  Impression: Technically successful ultrasound guided injection.        Assessment and Plan: 68 y.o. female with  Right wrist ganglion cyst: Small but present.  Doubtful that it is affecting her hand pain distally.  Discussed options.  We could proceed with aspiration and injection however patient would like to proceed with watchful waiting.  Recommend wrist braces if needed.  Recheck in the future.  Left ankle pain: Patient identified her left lateral ankle pain is the worst of her orthopedic issues today.  She would like to proceed with steroid injection.  Injection as above.  Will return in the near future for injection of other orthopedic issues if needed.  Would like to avoid high-dose or excessive doses of steroids if possible due to diabetes history.  Bilateral foot pain is metatarsalgia: We discussed options.  Leg swelling: Venous stasis dermatitis.  Continue Lasix per PCP.  Consider compression stockings.  Chronic pain: We will go ahead and refill hydrocodone for 1 month as PCP is currently out of the clinic.  PDMP reviewed during this encounter. No orders of the defined types were placed in this encounter.  Meds ordered this encounter  Medications  . HYDROcodone-acetaminophen (NORCO/VICODIN) 5-325 MG tablet    Sig: Take 1 tablet by mouth every 8 (eight) hours as needed for moderate pain.    Dispense:  60 tablet    Refill:  0    Chronic pain medication. Pt is on pain contract.     Historical information moved to improve visibility of documentation.  Past Medical History:  Diagnosis Date  . Anemia    hx of low iron  . Anxiety   . Arthritis   . Cataracts, bilateral   . Chronic kidney disease    nephrologist  Dr. Gillian Shields [facility], told pt. she is stage III  . Diabetes ([place])   . Family history of coronary artery disease   . Fatty liver   .  GERD (gastroesophageal reflux disease)   . Headache   . Morbid obesity ([place])   . Peripheral vascular disease ([place])   . PONV (postoperative nausea and vomiting)   . Pulmonary embolism ([place]) 2001  . Restless leg syndrome   . Shortness of breath dyspnea   . Sleep apnea    can't wear her cpap due to nose being sore  . Venous stasis dermatitis of both lower extremities    Past Surgical History:  Procedure Laterality Date  . ABDOMINAL HYSTERECTOMY    . APPENDECTOMY    . BUNIONECTOMY Right 01/28/2015   Procedure: Lillard Anes;  Surgeon: Hessie Dibble, MD;  Location: [facility];  Service: Orthopedics;  Laterality: Right;  . CARDIAC CATHETERIZATION  2014  . CHOLECYSTECTOMY    . COLONOSCOPY    . COLONOSCOPY    . FRACTURE SURGERY Left    shoulder  . HERNIA REPAIR     umbilical hernia  . JOINT REPLACEMENT Right 2001   knee  . PANNICULECTOMY    . SHOULDER ARTHROSCOPY Right 02/24/2016   Procedure:  RIGHT SHOULDER ARTHROSCOPY, ACROMIOPLASTY, DISTAL CLAVICLE RESECTION, DEBRIDEMENT;  Surgeon: Melrose Nakayama, MD;  Location: [facility];  Service: Orthopedics;  Laterality: Right;  . TENNIS ELBOW RELEASE/NIRSCHEL PROCEDURE Right 02/24/2016   Procedure: RIGHT ELBOW LATERAL REPAIR;  Surgeon: Melrose Nakayama, MD;  Location: [facility];  Service: Orthopedics;  Laterality: Right;   Social History   Tobacco Use  . Smoking status: Never Smoker  . Smokeless tobacco: Never Used  Substance Use Topics  . Alcohol use: No   family history includes Cancer in her maternal grandfather and maternal grandmother; Crohn's disease in her sister; Diabetes in her mother; Heart attack in her father and mother; Heart failure in her sister; Hyperlipidemia in her father and mother; Hypertension in her father, mother, and sister; Parkinsonism in her brother; Renal Disease in her sister; Stroke in her paternal grandfather; Thyroid disease in her sister.  Medications: Current Outpatient Medications  Medication Sig Dispense Refill  .  acetaminophen (TYLENOL) 500 MG tablet Take 1,000 mg by mouth.    [name] Kitchen albuterol (PROVENTIL HFA;VENTOLIN HFA) 108 (90 Base) MCG/ACT inhaler Inhale 1-2 puffs into the lungs every 6 (six) hours as needed for wheezing or shortness of breath. 1 Inhaler 5  . ALPRAZolam (XANAX) 0.5 MG tablet Take 1 tablet (0.5 mg total) by mouth at bedtime as needed for anxiety. 15 tablet 0  . AMBULATORY NON FORMULARY MEDICATION Adult incontinence panties for Adult incontinence R32. 100 each 11  . Bismuth Tribromoph-Petrolatum (XEROFORM PETROLAT GAUZE 5"X9") MISC Apply on each leg to cover wounds. Re-apply daily or as needed. 15 each 1  . budesonide-formoterol (SYMBICORT) 160-4.5 MCG/ACT inhaler Inhale 2 puffs into the lungs 2 (two) times daily. 3 Inhaler 3  . citalopram (CELEXA) 40 MG tablet Take 1 tablet (40 mg total) by mouth daily. 90 tablet 1  . co-enzyme Q-10 50 MG capsule Take 50 mg by mouth daily.    . cyclobenzaprine (FLEXERIL) 10 MG tablet Take 1 tablet (10 mg total) by mouth 3 (three) times daily as needed for muscle spasms. 30 tablet 0  . DULoxetine (CYMBALTA) 60 MG capsule 2 po daily 180 capsule 1  . ferrous sulfate 325 (65 FE) MG EC tablet Take 1 tablet (325 mg total) by mouth 3 (three) times daily with meals. 90 tablet 5  . furosemide (LASIX) 40 MG tablet Take 1 tablet (40 mg total) by mouth 2 (two) times daily. 180 tablet 1  . glipiZIDE (GLUCOTROL) 5 MG tablet TAKE ONE TABLET BY MOUTH ONCE DAILY BEFORE BREAKFAST 90 tablet 1  . glucose blood (ONE TOUCH ULTRA TEST) test strip Check fasting blood sugar every am and random 2 hours after 1 meal daily. DM E11.9 200 each 5  . HYDROcodone-acetaminophen (NORCO/VICODIN) 5-325 MG tablet Take 1 tablet by mouth every 8 (eight) hours as needed for moderate pain. 60 tablet 0  . hydrOXYzine (ATARAX/VISTARIL) 10 MG tablet Take 1 tablet (10 mg total) by mouth 3 (three) times daily as needed for itching or anxiety. 60 tablet 0  . Lancets (ONETOUCH ULTRASOFT) lancets Check fasting  blood sugar every am and random 2 hours after 1 meal daily. DM E11.9 200 each 5  . losartan (COZAAR) 25 MG tablet Take 1 tablet (25 mg total) by mouth daily. 90 tablet 1  . metFORMIN (GLUCOPHAGE) 1000 MG tablet Take 1 tablet (1,000 mg total) by mouth 2 (two) times daily with a meal. 180 tablet 2  . Multiple Vitamins-Minerals (OCUVITE PO) Take 1 tablet by mouth daily.    . Pitavastatin Calcium  4 MG TABS Take 1 tablet (4 mg total) by mouth daily. 90 tablet 1  . potassium chloride SA (K-DUR,KLOR-CON) 20 MEQ tablet Take 0.5 tablets (10 mEq total) by mouth daily. 120 tablet 1  . predniSONE (DELTASONE) 20 MG tablet Take 1 tablet (20 mg total) by mouth daily with breakfast. 5 tablet 0  . promethazine (PHENERGAN) 25 MG tablet Take 1 tablet (25 mg total) by mouth every 6 (six) hours as needed for nausea or vomiting. 30 tablet 0  . QUEtiapine (SEROQUEL) 25 MG tablet Take 1 tablet (25 mg total) by mouth at bedtime. 30 tablet 0  . spironolactone (ALDACTONE) 25 MG tablet Take 1 tablet (25 mg total) by mouth daily. 90 tablet 1  . Vitamin D, Ergocalciferol, (DRISDOL) 1.25 MG (50000 UT) CAPS capsule Take 1 capsule (50,000 Units total) by mouth once a week. 12 capsule 1   No current facility-administered medications for this visit.    Allergies  Allergen Reactions  . Amlodipine Itching and Rash  . Lovastatin Itching  . Sulfa Antibiotics Itching  . Sulfacetamide Sodium Itching  . Sulfamethoxazole Itching     Discussed warning signs or symptoms. Please see discharge instructions. Patient expresses understanding.

## 2019-05-08 ENCOUNTER — Other Ambulatory Visit: Payer: Self-pay | Admitting: Family Medicine

## 2019-05-10 ENCOUNTER — Other Ambulatory Visit: Payer: Self-pay

## 2019-05-10 DIAGNOSIS — F419 Anxiety disorder, unspecified: Secondary | ICD-10-CM

## 2019-05-10 MED ORDER — ALPRAZOLAM 0.5 MG PO TABS: 0.5000 mg | ORAL_TABLET | Freq: Every evening | ORAL | 5 refills | Status: DC | PRN

## 2019-05-10 MED ORDER — VITAMIN D (ERGOCALCIFEROL) 1.25 MG (50000 UNIT) PO CAPS: 50000.0000 [IU] | ORAL_CAPSULE | ORAL | 1 refills | Status: DC

## 2019-05-10 NOTE — Telephone Encounter (Signed)
Last RX sent 04/04/19 for #15 with no RF  RX pended, please send if appropriate

## 2019-05-10 NOTE — Telephone Encounter (Signed)
Pt also wants her Vit D sent in. Scheduled for annual exam and to have labs in June.

## 2019-05-16 ENCOUNTER — Telehealth: Payer: Self-pay | Admitting: Physician Assistant

## 2019-05-16 MED ORDER — PREDNISONE 20 MG PO TABS: 20.0000 mg | ORAL_TABLET | Freq: Every day | ORAL | 0 refills | Status: DC

## 2019-05-16 NOTE — Telephone Encounter (Signed)
Patient made aware. RX sent to pharmacy.  

## 2019-05-16 NOTE — Addendum Note (Signed)
Addended by: Donella Stade on: 05/16/2019 11:56 AM   Modules accepted: Orders

## 2019-05-16 NOTE — Telephone Encounter (Signed)
Do you want me to offer patient a virtual visit to discuss?

## 2019-05-16 NOTE — Telephone Encounter (Signed)
Patient requested Prednisone to be sent in. "Its going to rain all week and its already swelling it up my feet."

## 2019-05-16 NOTE — Telephone Encounter (Signed)
Hx of responding well to prednisone oral short burst given. Last done

## 2019-05-30 ENCOUNTER — Encounter: Payer: Medicare Other | Admitting: Physician Assistant

## 2019-05-31 ENCOUNTER — Telehealth: Payer: Self-pay | Admitting: Physician Assistant

## 2019-05-31 NOTE — Telephone Encounter (Signed)
Pt called. She needs a refill on her hydrocodone this week( she has had to take a pill one every 8 hrs due to the rain we had been having). Thanks

## 2019-05-31 NOTE — Telephone Encounter (Signed)
Pt's appt changed to 06-04-19, please advise on RF

## 2019-05-31 NOTE — Telephone Encounter (Signed)
Patient has an appt with [name] tomorrow and can discuss at that time.

## 2019-06-01 ENCOUNTER — Telehealth: Payer: Self-pay | Admitting: Physician Assistant

## 2019-06-01 ENCOUNTER — Encounter: Payer: Medicare Other | Admitting: Physician Assistant

## 2019-06-01 DIAGNOSIS — L97121 Non-pressure chronic ulcer of left thigh limited to breakdown of skin: Secondary | ICD-10-CM | POA: Diagnosis not present

## 2019-06-01 DIAGNOSIS — L97919 Non-pressure chronic ulcer of unspecified part of right lower leg with unspecified severity: Secondary | ICD-10-CM | POA: Diagnosis not present

## 2019-06-01 DIAGNOSIS — I890 Lymphedema, not elsewhere classified: Secondary | ICD-10-CM | POA: Diagnosis not present

## 2019-06-01 DIAGNOSIS — Z0289 Encounter for other administrative examinations: Secondary | ICD-10-CM

## 2019-06-01 DIAGNOSIS — G8929 Other chronic pain: Secondary | ICD-10-CM

## 2019-06-01 DIAGNOSIS — L97821 Non-pressure chronic ulcer of other part of left lower leg limited to breakdown of skin: Secondary | ICD-10-CM | POA: Diagnosis not present

## 2019-06-01 DIAGNOSIS — I87331 Chronic venous hypertension (idiopathic) with ulcer and inflammation of right lower extremity: Secondary | ICD-10-CM | POA: Diagnosis not present

## 2019-06-01 DIAGNOSIS — M25562 Pain in left knee: Secondary | ICD-10-CM

## 2019-06-01 DIAGNOSIS — I87333 Chronic venous hypertension (idiopathic) with ulcer and inflammation of bilateral lower extremity: Secondary | ICD-10-CM | POA: Diagnosis not present

## 2019-06-01 DIAGNOSIS — L97811 Non-pressure chronic ulcer of other part of right lower leg limited to breakdown of skin: Secondary | ICD-10-CM | POA: Diagnosis not present

## 2019-06-01 MED ORDER — HYDROCODONE-ACETAMINOPHEN 5-325 MG PO TABS: 1.0000 | ORAL_TABLET | Freq: Three times a day (TID) | ORAL | 0 refills | Status: DC | PRN

## 2019-06-01 NOTE — Telephone Encounter (Signed)
Pt calls in for refill.

## 2019-06-04 ENCOUNTER — Ambulatory Visit: Payer: Medicare Other | Admitting: Family Medicine

## 2019-06-06 DIAGNOSIS — M25661 Stiffness of right knee, not elsewhere classified: Secondary | ICD-10-CM | POA: Diagnosis not present

## 2019-06-06 DIAGNOSIS — M25662 Stiffness of left knee, not elsewhere classified: Secondary | ICD-10-CM | POA: Diagnosis not present

## 2019-06-06 DIAGNOSIS — I87333 Chronic venous hypertension (idiopathic) with ulcer and inflammation of bilateral lower extremity: Secondary | ICD-10-CM | POA: Diagnosis not present

## 2019-06-06 DIAGNOSIS — R29898 Other symptoms and signs involving the musculoskeletal system: Secondary | ICD-10-CM | POA: Diagnosis not present

## 2019-06-06 DIAGNOSIS — I890 Lymphedema, not elsewhere classified: Secondary | ICD-10-CM | POA: Diagnosis not present

## 2019-06-08 ENCOUNTER — Other Ambulatory Visit: Payer: Self-pay | Admitting: Physician Assistant

## 2019-06-08 NOTE — Telephone Encounter (Signed)
Dr. Georgina Snell specifically stated in his last note that we would be avoiding further prednisone or at least trying to minimize it.

## 2019-06-08 NOTE — Telephone Encounter (Signed)
Patient calling in stating that she needs medication called in. Swelling up really bad. predniSONE (DELTASONE) 20 MG tablet [[id number]]. Please advise.

## 2019-06-08 NOTE — Telephone Encounter (Signed)
[name] called back and left a message stating she needs the prednisone for her feet. She would like the prednisone sent in today.

## 2019-06-11 DIAGNOSIS — D631 Anemia in chronic kidney disease: Secondary | ICD-10-CM | POA: Diagnosis not present

## 2019-06-11 DIAGNOSIS — R300 Dysuria: Secondary | ICD-10-CM | POA: Diagnosis not present

## 2019-06-11 DIAGNOSIS — Z7984 Long term (current) use of oral hypoglycemic drugs: Secondary | ICD-10-CM | POA: Diagnosis not present

## 2019-06-11 DIAGNOSIS — I129 Hypertensive chronic kidney disease with stage 1 through stage 4 chronic kidney disease, or unspecified chronic kidney disease: Secondary | ICD-10-CM | POA: Diagnosis not present

## 2019-06-11 DIAGNOSIS — N183 Chronic kidney disease, stage 3 (moderate): Secondary | ICD-10-CM | POA: Diagnosis not present

## 2019-06-11 DIAGNOSIS — E1122 Type 2 diabetes mellitus with diabetic chronic kidney disease: Secondary | ICD-10-CM | POA: Diagnosis not present

## 2019-06-11 NOTE — Telephone Encounter (Signed)
Left a message advising of recommendation.

## 2019-06-12 ENCOUNTER — Encounter: Payer: Self-pay | Admitting: Physician Assistant

## 2019-06-12 DIAGNOSIS — M67431 Ganglion, right wrist: Secondary | ICD-10-CM | POA: Diagnosis not present

## 2019-06-12 DIAGNOSIS — R300 Dysuria: Secondary | ICD-10-CM | POA: Diagnosis not present

## 2019-06-12 DIAGNOSIS — M79641 Pain in right hand: Secondary | ICD-10-CM | POA: Diagnosis not present

## 2019-06-12 DIAGNOSIS — N183 Chronic kidney disease, stage 3 (moderate): Secondary | ICD-10-CM | POA: Diagnosis not present

## 2019-06-12 DIAGNOSIS — I129 Hypertensive chronic kidney disease with stage 1 through stage 4 chronic kidney disease, or unspecified chronic kidney disease: Secondary | ICD-10-CM | POA: Diagnosis not present

## 2019-06-12 DIAGNOSIS — D631 Anemia in chronic kidney disease: Secondary | ICD-10-CM | POA: Diagnosis not present

## 2019-06-12 MED ORDER — PREDNISONE 20 MG PO TABS: 20.0000 mg | ORAL_TABLET | Freq: Every day | ORAL | 0 refills | Status: DC

## 2019-06-12 NOTE — Telephone Encounter (Signed)
Not sure the pros and cons and risks versus benefits here.  Obviously giving her steroids so frequently exposed her to additional rest but she does not have really good treatment options for her pain.  I think we should meet as a medical group and make a decision about a policy for [name]. for now I think is okay to send her dose of prednisone but we should get together to make sure we have a comprehensive policy so we are in the same page about how frequently she can get prednisone doses.

## 2019-06-12 NOTE — Telephone Encounter (Signed)
Sounds good

## 2019-06-12 NOTE — Telephone Encounter (Signed)
I really don't feel comfortable sending prednisone at her request for "flares of pain" so frequently. Since you are managing these symptoms I thought I would let you make the call. She does have follow up with you on 6/23.

## 2019-06-12 NOTE — Telephone Encounter (Signed)
Patient states that she would like to speak to a nurse about her request yesterday. States that no one has called her back. She is in a lot of pain.

## 2019-06-12 NOTE — Telephone Encounter (Signed)
Error

## 2019-06-12 NOTE — Addendum Note (Signed)
Addended by: Donella Stade on: 06/12/2019 12:21 PM   Modules accepted: Orders

## 2019-06-13 ENCOUNTER — Ambulatory Visit (INDEPENDENT_AMBULATORY_CARE_PROVIDER_SITE_OTHER): Payer: Medicare Other | Admitting: Physician Assistant

## 2019-06-13 DIAGNOSIS — Z5329 Procedure and treatment not carried out because of patient's decision for other reasons: Secondary | ICD-10-CM

## 2019-06-13 NOTE — Progress Notes (Signed)
No show

## 2019-06-14 ENCOUNTER — Ambulatory Visit: Payer: Medicare Other | Admitting: Family Medicine

## 2019-06-14 NOTE — Telephone Encounter (Signed)
Pt no-showed physical yesterday with PCP. Has follow up 06/19/19 with Dr Georgina Snell- prednisone will be discussed then

## 2019-06-15 ENCOUNTER — Ambulatory Visit: Payer: Medicare Other | Admitting: Family Medicine

## 2019-06-15 DIAGNOSIS — I87333 Chronic venous hypertension (idiopathic) with ulcer and inflammation of bilateral lower extremity: Secondary | ICD-10-CM | POA: Diagnosis not present

## 2019-06-15 DIAGNOSIS — M25662 Stiffness of left knee, not elsewhere classified: Secondary | ICD-10-CM | POA: Diagnosis not present

## 2019-06-15 DIAGNOSIS — M25661 Stiffness of right knee, not elsewhere classified: Secondary | ICD-10-CM | POA: Diagnosis not present

## 2019-06-15 DIAGNOSIS — I890 Lymphedema, not elsewhere classified: Secondary | ICD-10-CM | POA: Diagnosis not present

## 2019-06-15 DIAGNOSIS — L97821 Non-pressure chronic ulcer of other part of left lower leg limited to breakdown of skin: Secondary | ICD-10-CM | POA: Diagnosis not present

## 2019-06-15 DIAGNOSIS — L97121 Non-pressure chronic ulcer of left thigh limited to breakdown of skin: Secondary | ICD-10-CM | POA: Diagnosis not present

## 2019-06-15 DIAGNOSIS — R29898 Other symptoms and signs involving the musculoskeletal system: Secondary | ICD-10-CM | POA: Diagnosis not present

## 2019-06-15 DIAGNOSIS — L97811 Non-pressure chronic ulcer of other part of right lower leg limited to breakdown of skin: Secondary | ICD-10-CM | POA: Diagnosis not present

## 2019-06-18 DIAGNOSIS — R29898 Other symptoms and signs involving the musculoskeletal system: Secondary | ICD-10-CM | POA: Diagnosis not present

## 2019-06-18 DIAGNOSIS — M25662 Stiffness of left knee, not elsewhere classified: Secondary | ICD-10-CM | POA: Diagnosis not present

## 2019-06-18 DIAGNOSIS — M25661 Stiffness of right knee, not elsewhere classified: Secondary | ICD-10-CM | POA: Diagnosis not present

## 2019-06-18 DIAGNOSIS — I890 Lymphedema, not elsewhere classified: Secondary | ICD-10-CM | POA: Diagnosis not present

## 2019-06-18 DIAGNOSIS — I87333 Chronic venous hypertension (idiopathic) with ulcer and inflammation of bilateral lower extremity: Secondary | ICD-10-CM | POA: Diagnosis not present

## 2019-06-19 ENCOUNTER — Ambulatory Visit: Payer: Medicare Other | Admitting: Family Medicine

## 2019-06-19 DIAGNOSIS — N281 Cyst of kidney, acquired: Secondary | ICD-10-CM | POA: Diagnosis not present

## 2019-06-22 DIAGNOSIS — D500 Iron deficiency anemia secondary to blood loss (chronic): Secondary | ICD-10-CM | POA: Diagnosis not present

## 2019-06-22 DIAGNOSIS — E538 Deficiency of other specified B group vitamins: Secondary | ICD-10-CM | POA: Diagnosis not present

## 2019-06-25 ENCOUNTER — Telehealth: Payer: Self-pay | Admitting: Physician Assistant

## 2019-06-25 DIAGNOSIS — F331 Major depressive disorder, recurrent, moderate: Secondary | ICD-10-CM

## 2019-06-25 MED ORDER — CITALOPRAM HYDROBROMIDE 40 MG PO TABS: 40.0000 mg | ORAL_TABLET | Freq: Every day | ORAL | 0 refills | Status: DC

## 2019-06-25 NOTE — Telephone Encounter (Signed)
Ok sent to [facility].

## 2019-06-25 NOTE — Telephone Encounter (Signed)
Patient states that she is out of citalopram (CELEXA) 40 MG tablet [[id number]] and needs a refill on it. States that an in office appointment has been made but really needs a refill. Can do a virtual before if needed but not this week do to many appointments. Please advise.

## 2019-06-27 ENCOUNTER — Other Ambulatory Visit: Payer: Self-pay

## 2019-06-27 DIAGNOSIS — G8929 Other chronic pain: Secondary | ICD-10-CM

## 2019-06-27 DIAGNOSIS — M79641 Pain in right hand: Secondary | ICD-10-CM | POA: Diagnosis not present

## 2019-06-27 DIAGNOSIS — Z0289 Encounter for other administrative examinations: Secondary | ICD-10-CM

## 2019-06-27 DIAGNOSIS — M67431 Ganglion, right wrist: Secondary | ICD-10-CM | POA: Diagnosis not present

## 2019-06-27 DIAGNOSIS — M25511 Pain in right shoulder: Secondary | ICD-10-CM

## 2019-06-27 DIAGNOSIS — F331 Major depressive disorder, recurrent, moderate: Secondary | ICD-10-CM

## 2019-06-27 MED ORDER — CITALOPRAM HYDROBROMIDE 40 MG PO TABS: 40.0000 mg | ORAL_TABLET | Freq: Every day | ORAL | 0 refills | Status: DC

## 2019-06-27 MED ORDER — HYDROCODONE-ACETAMINOPHEN 5-325 MG PO TABS: 1.0000 | ORAL_TABLET | Freq: Three times a day (TID) | ORAL | 0 refills | Status: DC | PRN

## 2019-06-27 NOTE — Telephone Encounter (Addendum)
[name] called and left a message requested a refill on Hydrocodone. She states she had "a touch of the gout" and had to take more than 2 daily. She also need the Citalopram sent to [facility] because she is completely out.

## 2019-06-27 NOTE — Telephone Encounter (Signed)
Left pt msg advising RX sent  

## 2019-07-02 DIAGNOSIS — I890 Lymphedema, not elsewhere classified: Secondary | ICD-10-CM | POA: Diagnosis not present

## 2019-07-02 DIAGNOSIS — M25661 Stiffness of right knee, not elsewhere classified: Secondary | ICD-10-CM | POA: Diagnosis not present

## 2019-07-02 DIAGNOSIS — L97929 Non-pressure chronic ulcer of unspecified part of left lower leg with unspecified severity: Secondary | ICD-10-CM | POA: Diagnosis not present

## 2019-07-02 DIAGNOSIS — I87333 Chronic venous hypertension (idiopathic) with ulcer and inflammation of bilateral lower extremity: Secondary | ICD-10-CM | POA: Diagnosis not present

## 2019-07-02 DIAGNOSIS — M25662 Stiffness of left knee, not elsewhere classified: Secondary | ICD-10-CM | POA: Diagnosis not present

## 2019-07-02 DIAGNOSIS — R29898 Other symptoms and signs involving the musculoskeletal system: Secondary | ICD-10-CM | POA: Diagnosis not present

## 2019-07-02 DIAGNOSIS — L97919 Non-pressure chronic ulcer of unspecified part of right lower leg with unspecified severity: Secondary | ICD-10-CM | POA: Diagnosis not present

## 2019-07-03 DIAGNOSIS — D500 Iron deficiency anemia secondary to blood loss (chronic): Secondary | ICD-10-CM | POA: Diagnosis not present

## 2019-07-05 ENCOUNTER — Other Ambulatory Visit: Payer: Self-pay | Admitting: Physician Assistant

## 2019-07-05 DIAGNOSIS — I10 Essential (primary) hypertension: Secondary | ICD-10-CM

## 2019-07-05 DIAGNOSIS — R600 Localized edema: Secondary | ICD-10-CM

## 2019-07-09 DIAGNOSIS — M25662 Stiffness of left knee, not elsewhere classified: Secondary | ICD-10-CM | POA: Diagnosis not present

## 2019-07-09 DIAGNOSIS — L97919 Non-pressure chronic ulcer of unspecified part of right lower leg with unspecified severity: Secondary | ICD-10-CM | POA: Diagnosis not present

## 2019-07-09 DIAGNOSIS — I890 Lymphedema, not elsewhere classified: Secondary | ICD-10-CM | POA: Diagnosis not present

## 2019-07-09 DIAGNOSIS — M25661 Stiffness of right knee, not elsewhere classified: Secondary | ICD-10-CM | POA: Diagnosis not present

## 2019-07-09 DIAGNOSIS — R29898 Other symptoms and signs involving the musculoskeletal system: Secondary | ICD-10-CM | POA: Diagnosis not present

## 2019-07-09 DIAGNOSIS — I87333 Chronic venous hypertension (idiopathic) with ulcer and inflammation of bilateral lower extremity: Secondary | ICD-10-CM | POA: Diagnosis not present

## 2019-07-11 ENCOUNTER — Ambulatory Visit: Payer: Medicare Other | Admitting: Physician Assistant

## 2019-07-11 ENCOUNTER — Telehealth: Payer: Self-pay | Admitting: Physician Assistant

## 2019-07-11 MED ORDER — PREDNISONE 20 MG PO TABS: 20.0000 mg | ORAL_TABLET | Freq: Every day | ORAL | 0 refills | Status: DC

## 2019-07-11 NOTE — Telephone Encounter (Signed)
Patient states that she can hardly walk, wanted to know if she can get some predniSONE (DELTASONE) 20 MG tablet [[id number]] to hold her off until appointment on Friday. Please contact and advise.

## 2019-07-11 NOTE — Telephone Encounter (Signed)
Sent to [facility]

## 2019-07-13 ENCOUNTER — Ambulatory Visit (INDEPENDENT_AMBULATORY_CARE_PROVIDER_SITE_OTHER): Payer: Medicare Other | Admitting: Physician Assistant

## 2019-07-13 ENCOUNTER — Other Ambulatory Visit: Payer: Self-pay

## 2019-07-13 ENCOUNTER — Encounter: Payer: Self-pay | Admitting: Physician Assistant

## 2019-07-13 VITALS — BP 138/48 | HR 99 | Temp 98.4°F | Ht 60.0 in | Wt 304.0 lb

## 2019-07-13 DIAGNOSIS — Z1231 Encounter for screening mammogram for malignant neoplasm of breast: Secondary | ICD-10-CM | POA: Diagnosis not present

## 2019-07-13 DIAGNOSIS — R0602 Shortness of breath: Secondary | ICD-10-CM | POA: Diagnosis not present

## 2019-07-13 DIAGNOSIS — R531 Weakness: Secondary | ICD-10-CM | POA: Diagnosis not present

## 2019-07-13 DIAGNOSIS — N183 Chronic kidney disease, stage 3 unspecified: Secondary | ICD-10-CM

## 2019-07-13 DIAGNOSIS — E559 Vitamin D deficiency, unspecified: Secondary | ICD-10-CM | POA: Diagnosis not present

## 2019-07-13 DIAGNOSIS — L03011 Cellulitis of right finger: Secondary | ICD-10-CM

## 2019-07-13 DIAGNOSIS — R600 Localized edema: Secondary | ICD-10-CM | POA: Diagnosis not present

## 2019-07-13 DIAGNOSIS — E781 Pure hyperglyceridemia: Secondary | ICD-10-CM

## 2019-07-13 DIAGNOSIS — J0100 Acute maxillary sinusitis, unspecified: Secondary | ICD-10-CM | POA: Diagnosis not present

## 2019-07-13 DIAGNOSIS — F331 Major depressive disorder, recurrent, moderate: Secondary | ICD-10-CM | POA: Diagnosis not present

## 2019-07-13 DIAGNOSIS — E119 Type 2 diabetes mellitus without complications: Secondary | ICD-10-CM

## 2019-07-13 DIAGNOSIS — I890 Lymphedema, not elsewhere classified: Secondary | ICD-10-CM

## 2019-07-13 DIAGNOSIS — D631 Anemia in chronic kidney disease: Secondary | ICD-10-CM

## 2019-07-13 DIAGNOSIS — I10 Essential (primary) hypertension: Secondary | ICD-10-CM | POA: Diagnosis not present

## 2019-07-13 LAB — POCT GLYCOSYLATED HEMOGLOBIN (HGB A1C): Hemoglobin A1C: 5.6 % (ref 4.0–5.6)

## 2019-07-13 MED ORDER — CITALOPRAM HYDROBROMIDE 40 MG PO TABS: 40.0000 mg | ORAL_TABLET | Freq: Every day | ORAL | 1 refills | Status: DC

## 2019-07-13 MED ORDER — GLIPIZIDE 5 MG PO TABS: ORAL_TABLET | ORAL | 1 refills | Status: DC

## 2019-07-13 MED ORDER — VITAMIN D (ERGOCALCIFEROL) 1.25 MG (50000 UNIT) PO CAPS: 50000.0000 [IU] | ORAL_CAPSULE | ORAL | 1 refills | Status: DC

## 2019-07-13 MED ORDER — DOXYCYCLINE HYCLATE 100 MG PO TABS: 100.0000 mg | ORAL_TABLET | Freq: Two times a day (BID) | ORAL | 0 refills | Status: DC

## 2019-07-13 MED ORDER — FERROUS SULFATE 325 (65 FE) MG PO TBEC: 325.0000 mg | DELAYED_RELEASE_TABLET | Freq: Three times a day (TID) | ORAL | 5 refills | Status: DC

## 2019-07-13 MED ORDER — MUPIROCIN 2 % EX OINT: TOPICAL_OINTMENT | CUTANEOUS | 11 refills | Status: DC

## 2019-07-13 MED ORDER — FENOFIBRATE 145 MG PO TABS: 145.0000 mg | ORAL_TABLET | Freq: Every day | ORAL | 3 refills | Status: DC

## 2019-07-13 MED ORDER — DULOXETINE HCL 60 MG PO CPEP: ORAL_CAPSULE | ORAL | 1 refills | Status: DC

## 2019-07-13 MED ORDER — METFORMIN HCL 1000 MG PO TABS: 1000.0000 mg | ORAL_TABLET | Freq: Two times a day (BID) | ORAL | 2 refills | Status: DC

## 2019-07-13 MED ORDER — SPIRONOLACTONE 25 MG PO TABS: 25.0000 mg | ORAL_TABLET | Freq: Every day | ORAL | 1 refills | Status: DC

## 2019-07-13 MED ORDER — LOSARTAN POTASSIUM 25 MG PO TABS: 25.0000 mg | ORAL_TABLET | Freq: Every day | ORAL | 1 refills | Status: DC

## 2019-07-13 MED ORDER — FUROSEMIDE 40 MG PO TABS: 40.0000 mg | ORAL_TABLET | Freq: Two times a day (BID) | ORAL | 1 refills | Status: DC

## 2019-07-13 NOTE — Progress Notes (Signed)
Subjective:    Patient ID: [name], female    DOB: 1951/01/14, 68 y.o.   MRN: [id number]  HPI  Pt is a 68 yo obese female with extensive PMHx   .[name] Kitchen Active Ambulatory Problems    Diagnosis Date Noted  . History of pulmonary embolism 03/20/2014  . Type II diabetes mellitus, well controlled ([place]) 03/20/2014  . Morbidly obese ([place]) 03/20/2014  . Family history of heart disease 03/20/2014  . Bunion, right foot 03/20/2014  . Frequent headaches 07/20/2016  . Macular degeneration, dry 07/20/2016  . No energy 07/21/2016  . OSA on CPAP 07/21/2016  . Closed fracture of nasal bone 07/21/2016  . Hyperlipidemia 07/21/2016  . Depression 07/21/2016  . Gastroesophageal reflux disease without esophagitis 07/21/2016  . Facial pain 07/21/2016  . Chronic bronchitis ([place]) 07/21/2016  . Bilateral lower extremity edema 07/21/2016  . B12 deficiency 07/21/2016  . Iron deficiency anemia 07/21/2016  . Anemia due to stage 3 chronic kidney disease ([place]) 08/04/2016  . Insomnia 08/04/2016  . Hypertriglyceridemia 08/06/2016  . Diabetic polyneuropathy associated with diabetes mellitus due to underlying condition ([place]) 11/07/2016  . Vitamin D deficiency 11/07/2016  . Chronic right shoulder pain 02/06/2017  . RLS (restless legs syndrome) 02/06/2017  . Essential hypertension 02/06/2017  . Left hand pain 02/06/2017  . Post-menopausal 02/06/2017  . Anxiety 02/06/2017  . Osteopenia 03/01/2017  . Aortic atherosclerosis ([place]) 03/16/2017  . Liver nodule 03/25/2017  . Chronic venous stasis dermatitis of both lower extremities 03/25/2017  . Chronic pain of both knees 05/12/2017  . Pernicious anemia 08/29/2017  . Primary osteoarthritis involving multiple joints 11/25/2017  . Bilateral lower leg cellulitis 11/25/2017  . Abdominal pain, RLQ 11/25/2017  . Abdominal distension (gaseous) 11/25/2017  . Liver cirrhosis secondary to NASH ([place]) 03/21/2017  . Elevated fasting glucose 02/27/2018  . Adenomatous  colon polyp 02/28/2018  . Seborrheic dermatitis 06/04/2018  . Hammer toes of both feet 06/05/2018  . Left breast mass 06/05/2018  . Cellulitis of right leg 06/05/2018  . Elevated serum creatinine 07/02/2018  . Hypomagnesemia 09/15/2018  . Hyponatremia 09/15/2018  . Incontinence in female 03/25/2019  . Metatarsalgia of both feet 05/03/2019  . Ganglion cyst of dorsum of right wrist 05/03/2019   Resolved Ambulatory Problems    Diagnosis Date Noted  . Sweating profusely 08/29/2017  . Second degree burn of abdomen 02/27/2018  . Skin lesion 06/05/2018   Past Medical History:  Diagnosis Date  . Anemia   . Arthritis   . Cataracts, bilateral   . Chronic kidney disease   . Diabetes ([place])   . Family history of coronary artery disease   . Fatty liver   . GERD (gastroesophageal reflux disease)   . Headache   . Morbid obesity ([place])   . Peripheral vascular disease ([place])   . PONV (postoperative nausea and vomiting)   . Pulmonary embolism ([place]) 2001  . Restless leg syndrome   . Shortness of breath dyspnea   . Sleep apnea   . Venous stasis dermatitis of both lower extremities    Who presents to the clinic to follow up on T2DM.   Pt is currently on metformin and glipizide for DM. She is not checking sugars. She has ongoing bilateral leg swelling and lymphedma. She sees lymphedema clinic. She feels like swelling is getting worse. She feels recently more SOB and weak. She wants to see her cardiologist for work up.   She is having more and more problems with bilateral feet pain.  She is walking less and less. She is having trouble sleeping. Seroquel kept her awake and stopped it. She feels more and more weak and SOB when she walks. She wants to see her cardiologist again for a "work up".   She recently saw hematology for iron infusion and does feel better. She continues to get b12 injections as well.   She continues to take norco twice a day, "and sometimes three times a day" for bilateral leg  and feet pain. She has a podiatrist but does not want to do surgery due to all of her risk factors.   She continues to not be able to sleep. Trazodone did not work. seroquel kept her awake. She feels like it is mainly due to pain that she cannot sleep.   Pt is having a lot of overactive bladder she has appt to see urologist. She is wearing adult diapers daily.   She has a right index finger that is swollen, tender, and red around the cuticle. She has been soaking it but not getting any better.   She has a lot of sinus pressure, congestion, ear pain for last week. No cough. No fever or chills.    Review of Systems See HPI.     Objective:   Physical Exam Vitals signs reviewed.  Constitutional:      Appearance: She is obese.  HENT:     Head: Normocephalic.  Cardiovascular:     Rate and Rhythm: Normal rate and regular rhythm.  Pulmonary:     Effort: Pulmonary effort is normal.     Breath sounds: Normal breath sounds. No wheezing, rhonchi or rales.  Chest:     Chest wall: No tenderness.  Skin:    Comments: Erythematous legs. 2+ edema.   Neurological:     General: No focal deficit present.     Mental Status: She is alert and oriented to person, place, and time.  Psychiatric:        Mood and Affect: Mood normal.           Assessment & Plan:  [name] KitchenMarland KitchenDoris was seen today for diabetes.  Diagnoses and all orders for this visit:  Type II diabetes mellitus, well controlled ([place]) -     POCT glycosylated hemoglobin (Hb A1C) -     glipiZIDE (GLUCOTROL) 5 MG tablet; TAKE ONE TABLET BY MOUTH ONCE DAILY BEFORE BREAKFAST -     metFORMIN (GLUCOPHAGE) 1000 MG tablet; Take 1 tablet (1,000 mg total) by mouth 2 (two) times daily with a meal.  Anemia due to stage 3 chronic kidney disease (HCC) -     ferrous sulfate 325 (65 FE) MG EC tablet; Take 1 tablet (325 mg total) by mouth 3 (three) times daily with meals.  Essential hypertension -     losartan (COZAAR) 25 MG tablet; Take 1 tablet (25  mg total) by mouth daily. -     spironolactone (ALDACTONE) 25 MG tablet; Take 1 tablet (25 mg total) by mouth daily.  Moderate episode of recurrent major depressive disorder (HCC) -     DULoxetine (CYMBALTA) 60 MG capsule; 2 po daily -     citalopram (CELEXA) 40 MG tablet; Take 1 tablet (40 mg total) by mouth daily.  Bilateral edema of lower extremity -     furosemide (LASIX) 40 MG tablet; Take 1 tablet (40 mg total) by mouth 2 (two) times daily.  SOB (shortness of breath)  Weakness  Visit for screening mammogram -     MM 3D  SCREEN BREAST BILATERAL  Vitamin D deficiency -     Vitamin D, Ergocalciferol, (DRISDOL) 1.25 MG (50000 UT) CAPS capsule; Take 1 capsule (50,000 Units total) by mouth once a week.  Acute non-recurrent maxillary sinusitis -     doxycycline (VIBRA-TABS) 100 MG tablet; Take 1 tablet (100 mg total) by mouth 2 (two) times daily.  Paronychia of finger of right hand -     mupirocin ointment (BACTROBAN) 2 %; Apply daily to bilateral nares. -     doxycycline (VIBRA-TABS) 100 MG tablet; Take 1 tablet (100 mg total) by mouth 2 (two) times daily.  Hypertriglyceridemia -     fenofibrate (TRICOR) 145 MG tablet; Take 1 tablet (145 mg total) by mouth daily.    .. Depression screen [facility] 2/9 07/13/2019 05/31/2018 08/31/2017 02/04/2017  Decreased Interest 1 2 2 2   Down, Depressed, Hopeless 1 2 0 0  PHQ - 2 Score 2 4 2 2   Altered sleeping 1 3 1 2   Tired, decreased energy 1 3 2 2   Change in appetite 1 2 1  0  Feeling bad or failure about yourself  0 0 0 0  Trouble concentrating 0 0 0 0  Moving slowly or fidgety/restless 0 0 0 0  Suicidal thoughts 0 0 0 0  PHQ-9 Score 5 12 6 6   Difficult doing work/chores Somewhat difficult Somewhat difficult - Not difficult at all    . Results for orders placed or performed in visit on 07/13/19  POCT glycosylated hemoglobin (Hb A1C)  Result Value Ref Range   Hemoglobin A1C 5.6 4.0 - 5.6 %   HbA1c POC (<> result, manual entry)     HbA1c,  POC (prediabetic range)     HbA1c, POC (controlled diabetic range)     A!C looks great.  Stay on same meds.  On ACE/ARB. On STATIN but not taking regularly. Encouraged to at least take once a week.  Eye exam UTD. Vaccines UTD.   TG remain elevated. Will try tricor. She is taking fish oil.   Refilled as needed medications.   Doxycycline given for paronychia/sinus infection. Discussed symptomatic care.   Not due for norco refill.   Continue with lymphedema clinic. Will make referral for cardiology work up. I do not see echo on file.

## 2019-07-13 NOTE — Progress Notes (Signed)
5.6  

## 2019-07-16 ENCOUNTER — Other Ambulatory Visit: Payer: Self-pay | Admitting: Physician Assistant

## 2019-07-16 ENCOUNTER — Encounter: Payer: Self-pay | Admitting: Physician Assistant

## 2019-07-16 DIAGNOSIS — Z0289 Encounter for other administrative examinations: Secondary | ICD-10-CM

## 2019-07-16 DIAGNOSIS — I890 Lymphedema, not elsewhere classified: Secondary | ICD-10-CM | POA: Insufficient documentation

## 2019-07-16 DIAGNOSIS — I10 Essential (primary) hypertension: Secondary | ICD-10-CM

## 2019-07-16 DIAGNOSIS — G8929 Other chronic pain: Secondary | ICD-10-CM

## 2019-07-16 DIAGNOSIS — E119 Type 2 diabetes mellitus without complications: Secondary | ICD-10-CM

## 2019-07-16 MED ORDER — GLIPIZIDE 5 MG PO TABS: ORAL_TABLET | ORAL | 1 refills | Status: DC

## 2019-07-16 MED ORDER — POTASSIUM CHLORIDE CRYS ER 20 MEQ PO TBCR: 10.0000 meq | EXTENDED_RELEASE_TABLET | Freq: Every day | ORAL | 1 refills | Status: AC

## 2019-07-16 MED ORDER — LOSARTAN POTASSIUM 25 MG PO TABS: 25.0000 mg | ORAL_TABLET | Freq: Every day | ORAL | 1 refills | Status: DC

## 2019-07-16 MED ORDER — METFORMIN HCL 1000 MG PO TABS: 1000.0000 mg | ORAL_TABLET | Freq: Two times a day (BID) | ORAL | 2 refills | Status: DC

## 2019-07-16 NOTE — Telephone Encounter (Signed)
Patient called back and left vm to send to [facility].  RX sent.

## 2019-07-16 NOTE — Telephone Encounter (Signed)
Left message on machine for patient to call back with more information on [facility]. [facility].

## 2019-07-16 NOTE — Telephone Encounter (Signed)
Ok to send meds needed [facility].

## 2019-07-16 NOTE — Telephone Encounter (Signed)
Pt called.  She said all of her meds were sent to [facility] but she had asked that  the Metformin, Glipizide, Losartan, Potassium pills(just added this) be sent through [facility] mail order.  Thanks.

## 2019-07-17 DIAGNOSIS — R159 Full incontinence of feces: Secondary | ICD-10-CM | POA: Diagnosis not present

## 2019-07-17 DIAGNOSIS — N3946 Mixed incontinence: Secondary | ICD-10-CM | POA: Diagnosis not present

## 2019-07-17 DIAGNOSIS — N3281 Overactive bladder: Secondary | ICD-10-CM | POA: Diagnosis not present

## 2019-07-17 DIAGNOSIS — N952 Postmenopausal atrophic vaginitis: Secondary | ICD-10-CM | POA: Diagnosis not present

## 2019-07-17 NOTE — Telephone Encounter (Signed)
Patient states when they called her to schedule screening mammogram they told her they couldn't schedule because she needed a diagnostic mgm/ultrasound.   Patient states she had a bad burn on left breast and having severe itching. Okay to order diagnostic mgm/ultrasound?

## 2019-07-18 NOTE — Telephone Encounter (Signed)
I called [facility] and spoke with scheduler who patient spoke with. She states patient also told her she had a lump and tenderness in the breast, along with itching. She had spoken to the tech and they advised she needed diagnostic mammogram. They were unwilling to proceed with screening mammogram at this time. Patient didn't mention lump or tenderness to me. Please advise.

## 2019-07-18 NOTE — Telephone Encounter (Signed)
Her last mammogram was normal and radiologist said to continue with 3D screening mammograms. The burn was due to an external factor not internal. We do not do diagnostic mammogram unless patient finds a mass or has symptoms that cannot be characterized by something external.   If the burn is healed then ok to having screening mammogram. She can certainly use some eucerin cream over breast to help with itching. Does she currently have a rash?

## 2019-07-19 NOTE — Telephone Encounter (Signed)
Call pt: find out which breast and where lump is and we will order diagnostic.

## 2019-07-20 NOTE — Telephone Encounter (Signed)
Left message on machine for patient to call back.

## 2019-07-24 NOTE — Telephone Encounter (Signed)
Tried to call patient again with no answer 

## 2019-07-24 NOTE — Telephone Encounter (Signed)
Called patient again after receiving voicemail. Asked her to call back and ask for Triage since we can not reach each other on the phone so they can talk with her about where breast lump is located so she can order diagnostic imaging. When I call patient the number goes straight to vm.

## 2019-07-24 NOTE — Telephone Encounter (Signed)
Patient called back and left vm. Will call back.

## 2019-07-25 MED ORDER — HYDROCODONE-ACETAMINOPHEN 5-325 MG PO TABS: 1.0000 | ORAL_TABLET | Freq: Three times a day (TID) | ORAL | 0 refills | Status: DC | PRN

## 2019-07-25 NOTE — Telephone Encounter (Signed)
Patient called back again, unable to connect. She did ask for refill of pain medication to be able to be picked up Friday.

## 2019-07-25 NOTE — Addendum Note (Signed)
Addended byAnnamaria Helling on: 07/25/2019 03:30 PM   Modules accepted: Orders

## 2019-07-26 ENCOUNTER — Telehealth: Payer: Self-pay | Admitting: Physician Assistant

## 2019-07-26 DIAGNOSIS — N6320 Unspecified lump in the left breast, unspecified quadrant: Secondary | ICD-10-CM

## 2019-07-26 DIAGNOSIS — N644 Mastodynia: Secondary | ICD-10-CM

## 2019-07-26 NOTE — Telephone Encounter (Signed)
I called pt back.  It is her left breast. She said Her phone is not working properly, the first time you call you will get her vm when you call the second time the phone rings( I tried it and her phone range the second time.  She also needs a refill on her Hydrocodone sent in to [facility] (due on Saturday).  Thanks.

## 2019-07-26 NOTE — Telephone Encounter (Signed)
error 

## 2019-07-26 NOTE — Telephone Encounter (Signed)
Ok I just ordered Diagnostic. Breast clinic can figue out the rest.

## 2019-07-26 NOTE — Telephone Encounter (Signed)
See previous phone note - I have tried to reach patient multiple times. She told imaging she has a lump in her breast - she did not inform us of this. We need to know which breast this lump is located in in order to place correct order. I left her a voicemail to call us with this information yesterday. When you call patient it goes directly to voicemail.

## 2019-07-26 NOTE — Telephone Encounter (Signed)
Pt called. She wants to know if  Order was put in at [facility] in [place]) to get her mammogram with an ultrasound?

## 2019-07-26 NOTE — Telephone Encounter (Signed)
Spoke with patient and made her aware RX sent and order placed for diagnostic mgm.

## 2019-07-26 NOTE — Telephone Encounter (Signed)
Hydrocodone sent yesterday.   Mgm pended - Trevion Hoben do you want to order ultrasound?

## 2019-07-27 ENCOUNTER — Other Ambulatory Visit: Payer: Self-pay | Admitting: Neurology

## 2019-07-27 DIAGNOSIS — G8929 Other chronic pain: Secondary | ICD-10-CM

## 2019-07-27 DIAGNOSIS — Z0289 Encounter for other administrative examinations: Secondary | ICD-10-CM

## 2019-07-27 DIAGNOSIS — M25562 Pain in left knee: Secondary | ICD-10-CM

## 2019-07-27 MED ORDER — HYDROCODONE-ACETAMINOPHEN 5-325 MG PO TABS: 1.0000 | ORAL_TABLET | Freq: Three times a day (TID) | ORAL | 0 refills | Status: DC | PRN

## 2019-07-27 NOTE — Telephone Encounter (Signed)
Patient made aware RX sent.

## 2019-07-27 NOTE — Telephone Encounter (Signed)
Hydrocodone RX sent to mail order pharmacy. She is also requesting today, not tomorrow. Order pended.

## 2019-07-30 ENCOUNTER — Other Ambulatory Visit: Payer: Self-pay | Admitting: Physician Assistant

## 2019-07-31 ENCOUNTER — Telehealth: Payer: Self-pay | Admitting: Neurology

## 2019-07-31 MED ORDER — PREDNISONE 20 MG PO TABS: 20.0000 mg | ORAL_TABLET | Freq: Every day | ORAL | 0 refills | Status: DC

## 2019-07-31 NOTE — Telephone Encounter (Signed)
Left message on machine for patient making her aware RX sent.

## 2019-07-31 NOTE — Telephone Encounter (Signed)
Patient left vm stating with the recent weather she is having more problems with legs/feet. She is unable to walk without a walker and having trouble with her stairs. She has to go tomorrow to be fitted for compression stocking and is asking for a five day supply of prednisone. Please advise.

## 2019-07-31 NOTE — Telephone Encounter (Signed)
Sent!

## 2019-08-14 DIAGNOSIS — I87331 Chronic venous hypertension (idiopathic) with ulcer and inflammation of right lower extremity: Secondary | ICD-10-CM | POA: Diagnosis not present

## 2019-08-14 DIAGNOSIS — I890 Lymphedema, not elsewhere classified: Secondary | ICD-10-CM | POA: Diagnosis not present

## 2019-08-14 DIAGNOSIS — I87322 Chronic venous hypertension (idiopathic) with inflammation of left lower extremity: Secondary | ICD-10-CM | POA: Diagnosis not present

## 2019-08-14 DIAGNOSIS — L97919 Non-pressure chronic ulcer of unspecified part of right lower leg with unspecified severity: Secondary | ICD-10-CM | POA: Diagnosis not present

## 2019-08-14 DIAGNOSIS — L97811 Non-pressure chronic ulcer of other part of right lower leg limited to breakdown of skin: Secondary | ICD-10-CM | POA: Diagnosis not present

## 2019-08-24 DIAGNOSIS — K7469 Other cirrhosis of liver: Secondary | ICD-10-CM | POA: Diagnosis not present

## 2019-08-24 DIAGNOSIS — R1084 Generalized abdominal pain: Secondary | ICD-10-CM | POA: Diagnosis not present

## 2019-08-24 DIAGNOSIS — J9811 Atelectasis: Secondary | ICD-10-CM | POA: Diagnosis not present

## 2019-08-24 DIAGNOSIS — R079 Chest pain, unspecified: Secondary | ICD-10-CM | POA: Diagnosis not present

## 2019-08-24 DIAGNOSIS — K7581 Nonalcoholic steatohepatitis (NASH): Secondary | ICD-10-CM | POA: Diagnosis not present

## 2019-08-24 DIAGNOSIS — K7689 Other specified diseases of liver: Secondary | ICD-10-CM | POA: Diagnosis not present

## 2019-08-24 DIAGNOSIS — R000 Tachycardia, unspecified: Secondary | ICD-10-CM | POA: Diagnosis not present

## 2019-08-24 DIAGNOSIS — Z8639 Personal history of other endocrine, nutritional and metabolic disease: Secondary | ICD-10-CM | POA: Diagnosis not present

## 2019-08-24 DIAGNOSIS — I499 Cardiac arrhythmia, unspecified: Secondary | ICD-10-CM | POA: Diagnosis not present

## 2019-08-24 DIAGNOSIS — Z8679 Personal history of other diseases of the circulatory system: Secondary | ICD-10-CM | POA: Diagnosis not present

## 2019-08-24 DIAGNOSIS — N183 Chronic kidney disease, stage 3 (moderate): Secondary | ICD-10-CM | POA: Diagnosis not present

## 2019-08-24 DIAGNOSIS — I959 Hypotension, unspecified: Secondary | ICD-10-CM | POA: Diagnosis not present

## 2019-08-24 DIAGNOSIS — R072 Precordial pain: Secondary | ICD-10-CM | POA: Diagnosis not present

## 2019-08-24 DIAGNOSIS — Z86711 Personal history of pulmonary embolism: Secondary | ICD-10-CM | POA: Diagnosis not present

## 2019-08-24 DIAGNOSIS — R161 Splenomegaly, not elsewhere classified: Secondary | ICD-10-CM | POA: Diagnosis not present

## 2019-08-24 DIAGNOSIS — R0602 Shortness of breath: Secondary | ICD-10-CM | POA: Diagnosis not present

## 2019-08-24 DIAGNOSIS — K2950 Unspecified chronic gastritis without bleeding: Secondary | ICD-10-CM | POA: Diagnosis not present

## 2019-08-27 ENCOUNTER — Telehealth: Payer: Self-pay

## 2019-08-27 DIAGNOSIS — G8929 Other chronic pain: Secondary | ICD-10-CM

## 2019-08-27 DIAGNOSIS — M25562 Pain in left knee: Secondary | ICD-10-CM

## 2019-08-27 DIAGNOSIS — Z0289 Encounter for other administrative examinations: Secondary | ICD-10-CM

## 2019-08-27 MED ORDER — HYDROCODONE-ACETAMINOPHEN 5-325 MG PO TABS: 1.0000 | ORAL_TABLET | Freq: Three times a day (TID) | ORAL | 0 refills | Status: DC | PRN

## 2019-08-27 NOTE — Telephone Encounter (Signed)
Requested RF on Norco.   Last RX sent 07/27/19.  Last OV 07/13/19.  RX pended, [facility]

## 2019-08-28 ENCOUNTER — Ambulatory Visit (INDEPENDENT_AMBULATORY_CARE_PROVIDER_SITE_OTHER): Payer: Medicare Other | Admitting: Physician Assistant

## 2019-08-28 ENCOUNTER — Encounter: Payer: Self-pay | Admitting: Physician Assistant

## 2019-08-28 VITALS — Ht 60.0 in | Wt 304.0 lb

## 2019-08-28 DIAGNOSIS — G8929 Other chronic pain: Secondary | ICD-10-CM

## 2019-08-28 DIAGNOSIS — K257 Chronic gastric ulcer without hemorrhage or perforation: Secondary | ICD-10-CM

## 2019-08-28 DIAGNOSIS — K2900 Acute gastritis without bleeding: Secondary | ICD-10-CM | POA: Diagnosis not present

## 2019-08-28 DIAGNOSIS — M25561 Pain in right knee: Secondary | ICD-10-CM | POA: Diagnosis not present

## 2019-08-28 DIAGNOSIS — M25562 Pain in left knee: Secondary | ICD-10-CM | POA: Diagnosis not present

## 2019-08-28 MED ORDER — FAMOTIDINE 20 MG PO TABS: 20.0000 mg | ORAL_TABLET | Freq: Two times a day (BID) | ORAL | 5 refills | Status: DC

## 2019-08-28 NOTE — Progress Notes (Signed)
Chest pain last Friday Seen in ER Diagnosed with gastritis Feeling better, but having bone pain

## 2019-08-28 NOTE — Progress Notes (Signed)
Patient ID: [name], female   DOB: [birth year], 68 y.o.   MRN: [id number] .[name] KitchenVirtual Visit via Telephone Note  I connected with Esperansa A Ellis-Hill on 08/28/19 at  1:40 PM EDT by telephone and verified that I am speaking with the correct person using two identifiers.  Location: Patient: home Provider: clinic   I discussed the limitations, risks, security and privacy concerns of performing an evaluation and management service by telephone and the availability of in person appointments. I also discussed with the patient that there may be a patient responsible charge related to this service. The patient expressed understanding and agreed to proceed.   History of Present Illness: Pt is a 68 yo morbidly obese female with extensive PMH including T2DM, GERD, hx of ulcers, HTN, HLD who calls in to follow up after ED visit on 08/24/19 at [facility]. She presented to the ED with chest and epigastric pain. CT of abdomen was negative for acute findings, troponin negative, normal lipase, no changes to EKG. Her magneisum was low, hgb was 10, her serum creatine was up a little bit as well. She was given GI cocktail and 1 L of fluids and magnesium and her symptoms started to improve.   Pt did have endoscopy on 02/14/2019 and showed erosive gastritis with ulcers and erosions in antrum, multiple polyps.   She had been taking prevacid but switched to gaviscon.   She does need refill on pain medication for chronic usage due to her arthritis/bone spurs.   .. Active Ambulatory Problems    Diagnosis Date Noted  . History of pulmonary embolism 03/20/2014  . Type II diabetes mellitus, well controlled ([place]) 03/20/2014  . Morbidly obese ([place]) 03/20/2014  . Family history of heart disease 03/20/2014  . Bunion, right foot 03/20/2014  . Frequent headaches 07/20/2016  . Macular degeneration, dry 07/20/2016  . No energy 07/21/2016  . OSA on CPAP 07/21/2016  . Closed fracture of nasal bone 07/21/2016  .  Hyperlipidemia 07/21/2016  . Depression 07/21/2016  . Gastroesophageal reflux disease without esophagitis 07/21/2016  . Facial pain 07/21/2016  . Chronic bronchitis ([place]) 07/21/2016  . Bilateral lower extremity edema 07/21/2016  . B12 deficiency 07/21/2016  . Iron deficiency anemia 07/21/2016  . Anemia due to stage 3 chronic kidney disease ([place]) 08/04/2016  . Insomnia 08/04/2016  . Hypertriglyceridemia 08/06/2016  . Diabetic polyneuropathy associated with diabetes mellitus due to underlying condition ([place]) 11/07/2016  . Vitamin D deficiency 11/07/2016  . Chronic right shoulder pain 02/06/2017  . RLS (restless legs syndrome) 02/06/2017  . Essential hypertension 02/06/2017  . Left hand pain 02/06/2017  . Post-menopausal 02/06/2017  . Anxiety 02/06/2017  . Osteopenia 03/01/2017  . Aortic atherosclerosis ([place]) 03/16/2017  . Liver nodule 03/25/2017  . Chronic venous stasis dermatitis of both lower extremities 03/25/2017  . Chronic pain of both knees 05/12/2017  . Pernicious anemia 08/29/2017  . Primary osteoarthritis involving multiple joints 11/25/2017  . Bilateral lower leg cellulitis 11/25/2017  . Abdominal pain, RLQ 11/25/2017  . Abdominal distension (gaseous) 11/25/2017  . Liver cirrhosis secondary to NASH ([place]) 03/21/2017  . Elevated fasting glucose 02/27/2018  . Adenomatous colon polyp 02/28/2018  . Seborrheic dermatitis 06/04/2018  . Hammer toes of both feet 06/05/2018  . Left breast mass 06/05/2018  . Cellulitis of right leg 06/05/2018  . Elevated serum creatinine 07/02/2018  . Hypomagnesemia 09/15/2018  . Hyponatremia 09/15/2018  . Incontinence in female 03/25/2019  . Metatarsalgia of both feet 05/03/2019  . Ganglion cyst of dorsum  of right wrist 05/03/2019  . Lymphedema 07/16/2019   Resolved Ambulatory Problems    Diagnosis Date Noted  . Sweating profusely 08/29/2017  . Second degree burn of abdomen 02/27/2018  . Skin lesion 06/05/2018   Past Medical History:   Diagnosis Date  . Anemia   . Arthritis   . Cataracts, bilateral   . Chronic kidney disease   . Diabetes ([place])   . Family history of coronary artery disease   . Fatty liver   . GERD (gastroesophageal reflux disease)   . Headache   . Morbid obesity ([place])   . Peripheral vascular disease ([place])   . PONV (postoperative nausea and vomiting)   . Pulmonary embolism ([place]) 2001  . Restless leg syndrome   . Shortness of breath dyspnea   . Sleep apnea   . Venous stasis dermatitis of both lower extremities    Reviewed med, allergies, problem list.      Observations/Objective: No acute distress. No labored breathing.  Normal mood.   .. Today's Vitals   08/28/19 1106  Weight: (!) 304 lb (137.9 kg)  Height: 5' (1.524 m)   Body mass index is 59.37 kg/m.    Assessment and Plan: [name] KitchenMarland KitchenDoris was seen today for hospitalization follow-up.  Diagnoses and all orders for this visit:  Other acute gastritis without hemorrhage  Chronic gastric ulcer without hemorrhage and without perforation  Chronic pain of both knees  Other orders -     famotidine (PEPCID) 20 MG tablet; Take 1 tablet (20 mg total) by mouth 2 (two) times daily.   Gastritis seems to be doing better. EGD confirmed ulcers. Pt denies any melena or hematochezia. HcG is stable. Continue gaviscon can add pepcid. She does not want PPI due to side effects. Follow up as needed with GI.   Pain contract is out of date 05/2018. She will come back in October to resign.  Refilled yesterday.  [facility] for 3 months.    ..PDMP reviewed during this encounter.   Follow Up Instructions:    I discussed the assessment and treatment plan with the patient. The patient was provided an opportunity to ask questions and all were answered. The patient agreed with the plan and demonstrated an understanding of the instructions.   The patient was advised to call back or seek an in-person evaluation if the symptoms worsen or if the condition fails to  improve as anticipated.  I provided 18 minutes of non-face-to-face time during this encounter.   Iran Planas, PA-C

## 2019-08-29 ENCOUNTER — Encounter: Payer: Self-pay | Admitting: Physician Assistant

## 2019-08-29 DIAGNOSIS — K2970 Gastritis, unspecified, without bleeding: Secondary | ICD-10-CM | POA: Insufficient documentation

## 2019-08-29 DIAGNOSIS — K257 Chronic gastric ulcer without hemorrhage or perforation: Secondary | ICD-10-CM | POA: Insufficient documentation

## 2019-09-10 ENCOUNTER — Telehealth: Payer: Self-pay | Admitting: Neurology

## 2019-09-10 DIAGNOSIS — N6320 Unspecified lump in the left breast, unspecified quadrant: Secondary | ICD-10-CM

## 2019-09-10 DIAGNOSIS — N644 Mastodynia: Secondary | ICD-10-CM

## 2019-09-10 NOTE — Telephone Encounter (Signed)
Order faxed-with confirmation.

## 2019-09-10 NOTE — Telephone Encounter (Signed)
Received vm from Chela with [facility] stating they needed orders for Diagnostic MGM and bilateral US if indicated for patient faxed to [phone].

## 2019-09-17 ENCOUNTER — Telehealth: Payer: Self-pay

## 2019-09-17 MED ORDER — PREDNISONE 20 MG PO TABS: 20.0000 mg | ORAL_TABLET | Freq: Every day | ORAL | 0 refills | Status: DC

## 2019-09-17 NOTE — Telephone Encounter (Signed)
[name] called and left a message wanting prednisone for her feet and legs. Please advise.

## 2019-09-17 NOTE — Telephone Encounter (Signed)
Sent to pharmacy 

## 2019-09-17 NOTE — Telephone Encounter (Signed)
Patient advised.

## 2019-09-18 DIAGNOSIS — I872 Venous insufficiency (chronic) (peripheral): Secondary | ICD-10-CM | POA: Diagnosis not present

## 2019-09-18 DIAGNOSIS — I8311 Varicose veins of right lower extremity with inflammation: Secondary | ICD-10-CM | POA: Diagnosis not present

## 2019-09-18 DIAGNOSIS — Z9119 Patient's noncompliance with other medical treatment and regimen: Secondary | ICD-10-CM | POA: Diagnosis not present

## 2019-09-18 DIAGNOSIS — I87331 Chronic venous hypertension (idiopathic) with ulcer and inflammation of right lower extremity: Secondary | ICD-10-CM | POA: Diagnosis not present

## 2019-09-18 DIAGNOSIS — I8312 Varicose veins of left lower extremity with inflammation: Secondary | ICD-10-CM | POA: Diagnosis not present

## 2019-09-18 DIAGNOSIS — L97919 Non-pressure chronic ulcer of unspecified part of right lower leg with unspecified severity: Secondary | ICD-10-CM | POA: Diagnosis not present

## 2019-09-18 DIAGNOSIS — I890 Lymphedema, not elsewhere classified: Secondary | ICD-10-CM | POA: Diagnosis not present

## 2019-09-20 ENCOUNTER — Other Ambulatory Visit: Payer: Self-pay | Admitting: Physician Assistant

## 2019-09-20 DIAGNOSIS — R600 Localized edema: Secondary | ICD-10-CM

## 2019-09-20 DIAGNOSIS — I10 Essential (primary) hypertension: Secondary | ICD-10-CM

## 2019-09-25 ENCOUNTER — Other Ambulatory Visit: Payer: Self-pay

## 2019-09-25 DIAGNOSIS — M25562 Pain in left knee: Secondary | ICD-10-CM

## 2019-09-25 DIAGNOSIS — Z0289 Encounter for other administrative examinations: Secondary | ICD-10-CM

## 2019-09-25 DIAGNOSIS — G8929 Other chronic pain: Secondary | ICD-10-CM

## 2019-09-25 DIAGNOSIS — L299 Pruritus, unspecified: Secondary | ICD-10-CM

## 2019-09-25 DIAGNOSIS — F419 Anxiety disorder, unspecified: Secondary | ICD-10-CM

## 2019-09-25 NOTE — Telephone Encounter (Signed)
Requesting RF on Norco and Xanax  Norco last RF was 08/27/19  Xanax last RF 05/10/19 for #15 with 5 RF  Last appt 08/28/19

## 2019-09-26 MED ORDER — HYDROCODONE-ACETAMINOPHEN 5-325 MG PO TABS: 1.0000 | ORAL_TABLET | Freq: Three times a day (TID) | ORAL | 0 refills | Status: DC | PRN

## 2019-09-26 NOTE — Telephone Encounter (Signed)
Patient called back again today for a refill on the Hydrocodone and Xanax

## 2019-09-26 NOTE — Telephone Encounter (Signed)
She should have one more refill on xanax if was 4 months ago. I will refill norco.

## 2019-10-03 ENCOUNTER — Other Ambulatory Visit: Payer: Self-pay | Admitting: Physician Assistant

## 2019-10-03 DIAGNOSIS — F331 Major depressive disorder, recurrent, moderate: Secondary | ICD-10-CM

## 2019-10-09 DIAGNOSIS — Z79899 Other long term (current) drug therapy: Secondary | ICD-10-CM | POA: Diagnosis not present

## 2019-10-09 DIAGNOSIS — H02834 Dermatochalasis of left upper eyelid: Secondary | ICD-10-CM | POA: Diagnosis not present

## 2019-10-09 DIAGNOSIS — H02831 Dermatochalasis of right upper eyelid: Secondary | ICD-10-CM | POA: Diagnosis not present

## 2019-10-09 DIAGNOSIS — Z961 Presence of intraocular lens: Secondary | ICD-10-CM | POA: Diagnosis not present

## 2019-10-09 DIAGNOSIS — H1013 Acute atopic conjunctivitis, bilateral: Secondary | ICD-10-CM | POA: Diagnosis not present

## 2019-10-09 DIAGNOSIS — H353131 Nonexudative age-related macular degeneration, bilateral, early dry stage: Secondary | ICD-10-CM | POA: Diagnosis not present

## 2019-10-10 ENCOUNTER — Other Ambulatory Visit: Payer: Self-pay | Admitting: Neurology

## 2019-10-10 DIAGNOSIS — F331 Major depressive disorder, recurrent, moderate: Secondary | ICD-10-CM

## 2019-10-10 MED ORDER — DULOXETINE HCL 60 MG PO CPEP: ORAL_CAPSULE | ORAL | 0 refills | Status: DC

## 2019-10-15 ENCOUNTER — Other Ambulatory Visit: Payer: Self-pay

## 2019-10-15 DIAGNOSIS — D631 Anemia in chronic kidney disease: Secondary | ICD-10-CM | POA: Diagnosis not present

## 2019-10-15 DIAGNOSIS — F331 Major depressive disorder, recurrent, moderate: Secondary | ICD-10-CM

## 2019-10-15 DIAGNOSIS — N2581 Secondary hyperparathyroidism of renal origin: Secondary | ICD-10-CM | POA: Diagnosis not present

## 2019-10-15 DIAGNOSIS — N644 Mastodynia: Secondary | ICD-10-CM | POA: Diagnosis not present

## 2019-10-15 DIAGNOSIS — R809 Proteinuria, unspecified: Secondary | ICD-10-CM | POA: Diagnosis not present

## 2019-10-15 DIAGNOSIS — E1122 Type 2 diabetes mellitus with diabetic chronic kidney disease: Secondary | ICD-10-CM | POA: Diagnosis not present

## 2019-10-15 DIAGNOSIS — N1832 Chronic kidney disease, stage 3b: Secondary | ICD-10-CM | POA: Diagnosis not present

## 2019-10-15 DIAGNOSIS — Z7984 Long term (current) use of oral hypoglycemic drugs: Secondary | ICD-10-CM | POA: Diagnosis not present

## 2019-10-15 DIAGNOSIS — N183 Chronic kidney disease, stage 3 unspecified: Secondary | ICD-10-CM | POA: Diagnosis not present

## 2019-10-15 DIAGNOSIS — J42 Unspecified chronic bronchitis: Secondary | ICD-10-CM

## 2019-10-15 DIAGNOSIS — Z23 Encounter for immunization: Secondary | ICD-10-CM | POA: Diagnosis not present

## 2019-10-15 DIAGNOSIS — Z6841 Body Mass Index (BMI) 40.0 and over, adult: Secondary | ICD-10-CM | POA: Diagnosis not present

## 2019-10-15 DIAGNOSIS — I129 Hypertensive chronic kidney disease with stage 1 through stage 4 chronic kidney disease, or unspecified chronic kidney disease: Secondary | ICD-10-CM | POA: Diagnosis not present

## 2019-10-15 LAB — HM MAMMOGRAPHY

## 2019-10-15 MED ORDER — DULOXETINE HCL 60 MG PO CPEP: ORAL_CAPSULE | ORAL | 0 refills | Status: DC

## 2019-10-15 MED ORDER — BUDESONIDE-FORMOTEROL FUMARATE 160-4.5 MCG/ACT IN AERO: 2.0000 | INHALATION_SPRAY | Freq: Two times a day (BID) | RESPIRATORY_TRACT | 3 refills | Status: DC

## 2019-10-17 ENCOUNTER — Ambulatory Visit: Payer: Medicare Other | Admitting: Physician Assistant

## 2019-10-22 ENCOUNTER — Encounter: Payer: Self-pay | Admitting: Physician Assistant

## 2019-10-22 ENCOUNTER — Ambulatory Visit (INDEPENDENT_AMBULATORY_CARE_PROVIDER_SITE_OTHER): Payer: Medicare Other | Admitting: Physician Assistant

## 2019-10-22 VITALS — BP 137/67 | HR 103 | Ht 60.0 in | Wt 300.0 lb

## 2019-10-22 DIAGNOSIS — M25562 Pain in left knee: Secondary | ICD-10-CM

## 2019-10-22 DIAGNOSIS — G8929 Other chronic pain: Secondary | ICD-10-CM | POA: Diagnosis not present

## 2019-10-22 DIAGNOSIS — M79671 Pain in right foot: Secondary | ICD-10-CM | POA: Diagnosis not present

## 2019-10-22 DIAGNOSIS — F419 Anxiety disorder, unspecified: Secondary | ICD-10-CM

## 2019-10-22 DIAGNOSIS — K219 Gastro-esophageal reflux disease without esophagitis: Secondary | ICD-10-CM

## 2019-10-22 DIAGNOSIS — M25511 Pain in right shoulder: Secondary | ICD-10-CM

## 2019-10-22 DIAGNOSIS — N1831 Chronic kidney disease, stage 3a: Secondary | ICD-10-CM | POA: Diagnosis not present

## 2019-10-22 DIAGNOSIS — Z0289 Encounter for other administrative examinations: Secondary | ICD-10-CM | POA: Diagnosis not present

## 2019-10-22 DIAGNOSIS — E1165 Type 2 diabetes mellitus with hyperglycemia: Secondary | ICD-10-CM | POA: Diagnosis not present

## 2019-10-22 DIAGNOSIS — M79672 Pain in left foot: Secondary | ICD-10-CM | POA: Diagnosis not present

## 2019-10-22 DIAGNOSIS — M25561 Pain in right knee: Secondary | ICD-10-CM

## 2019-10-22 DIAGNOSIS — D508 Other iron deficiency anemias: Secondary | ICD-10-CM | POA: Diagnosis not present

## 2019-10-22 MED ORDER — ALPRAZOLAM 0.5 MG PO TABS: 0.5000 mg | ORAL_TABLET | Freq: Every evening | ORAL | 5 refills | Status: DC | PRN

## 2019-10-22 MED ORDER — PREDNISONE 5 MG PO TABS: 5.0000 mg | ORAL_TABLET | Freq: Every day | ORAL | 1 refills | Status: DC

## 2019-10-22 MED ORDER — FAMOTIDINE 20 MG PO TABS: 20.0000 mg | ORAL_TABLET | Freq: Two times a day (BID) | ORAL | 1 refills | Status: DC

## 2019-10-22 MED ORDER — HYDROCODONE-ACETAMINOPHEN 5-325 MG PO TABS: 1.0000 | ORAL_TABLET | Freq: Three times a day (TID) | ORAL | 0 refills | Status: DC | PRN

## 2019-10-22 MED ORDER — PREDNISONE 5 MG PO TABS: 5.0000 mg | ORAL_TABLET | Freq: Every day | ORAL | 0 refills | Status: DC

## 2019-10-22 NOTE — Progress Notes (Signed)
Blood sugar - 85 today

## 2019-10-22 NOTE — Progress Notes (Signed)
Patient ID: [name], female   DOB: Nov 13, 1951, 68 y.o.   MRN: [id number] .[name] KitchenVirtual Visit via Telephone Note  I connected with [name] on 10/22/19 at  2:00 PM EDT by telephone and verified that I am speaking with the correct person using two identifiers.  Location: Patient: home Provider: clinic   I discussed the limitations, risks, security and privacy concerns of performing an evaluation and management service by telephone and the availability of in person appointments. I also discussed with the patient that there may be a patient responsible charge related to this service. The patient expressed understanding and agreed to proceed.   History of Present Illness: Pt is a 68 yo obese female with T2DM, HTN, OSA, GERD, liver cirrhosis who is in chronic pain due to arthritis. She was not able to come into the clinic today.   She continues to take hydrocodone twice a day. She has been needing prednisone about once a month "to make it". She has been cutting the 50mg  tablets in fourths. She would like a smaller amount so that she does not have to cut up. She feels like she cannot move at times without prednisone.   Nephrologist cut metformin back to 1000mg  recently due to her kidney function.   She is not checking sugars. She is not dieting or exercising. She denies any open sores or wounds.   .. Active Ambulatory Problems    Diagnosis Date Noted  . History of pulmonary embolism 03/20/2014  . Type II diabetes mellitus, well controlled ([place]) 03/20/2014  . Morbidly obese ([place]) 03/20/2014  . Family history of heart disease 03/20/2014  . Bunion, right foot 03/20/2014  . Frequent headaches 07/20/2016  . Macular degeneration, dry 07/20/2016  . No energy 07/21/2016  . OSA on CPAP 07/21/2016  . Closed fracture of nasal bone 07/21/2016  . Hyperlipidemia 07/21/2016  . Depression 07/21/2016  . Gastroesophageal reflux disease without esophagitis 07/21/2016  . Facial pain  07/21/2016  . Chronic bronchitis ([place]) 07/21/2016  . Bilateral lower extremity edema 07/21/2016  . B12 deficiency 07/21/2016  . Iron deficiency anemia 07/21/2016  . Anemia due to stage 3 chronic kidney disease ([place]) 08/04/2016  . Insomnia 08/04/2016  . Hypertriglyceridemia 08/06/2016  . Diabetic polyneuropathy associated with diabetes mellitus due to underlying condition ([place]) 11/07/2016  . Vitamin D deficiency 11/07/2016  . Chronic right shoulder pain 02/06/2017  . RLS (restless legs syndrome) 02/06/2017  . Essential hypertension 02/06/2017  . Left hand pain 02/06/2017  . Post-menopausal 02/06/2017  . Anxiety 02/06/2017  . Osteopenia 03/01/2017  . Aortic atherosclerosis ([place]) 03/16/2017  . Liver nodule 03/25/2017  . Chronic venous stasis dermatitis of both lower extremities 03/25/2017  . Chronic pain of both knees 05/12/2017  . Pernicious anemia 08/29/2017  . Primary osteoarthritis involving multiple joints 11/25/2017  . Bilateral lower leg cellulitis 11/25/2017  . Abdominal pain, RLQ 11/25/2017  . Abdominal distension (gaseous) 11/25/2017  . Liver cirrhosis secondary to NASH ([place]) 03/21/2017  . Elevated fasting glucose 02/27/2018  . Adenomatous colon polyp 02/28/2018  . Seborrheic dermatitis 06/04/2018  . Hammer toes of both feet 06/05/2018  . Left breast mass 06/05/2018  . Cellulitis of right leg 06/05/2018  . Elevated serum creatinine 07/02/2018  . Hypomagnesemia 09/15/2018  . Hyponatremia 09/15/2018  . Incontinence in female 03/25/2019  . Metatarsalgia of both feet 05/03/2019  . Ganglion cyst of dorsum of right wrist 05/03/2019  . Lymphedema 07/16/2019  . Chronic gastric ulcer without hemorrhage and without perforation 08/29/2019  .  Gastritis 08/29/2019   Resolved Ambulatory Problems    Diagnosis Date Noted  . Sweating profusely 08/29/2017  . Second degree burn of abdomen 02/27/2018  . Skin lesion 06/05/2018   Past Medical History:  Diagnosis Date  . Anemia    . Arthritis   . Cataracts, bilateral   . Chronic kidney disease   . Diabetes ([place])   . Family history of coronary artery disease   . Fatty liver   . GERD (gastroesophageal reflux disease)   . Headache   . Morbid obesity ([place])   . Peripheral vascular disease ([place])   . PONV (postoperative nausea and vomiting)   . Pulmonary embolism ([place]) 2001  . Restless leg syndrome   . Shortness of breath dyspnea   . Sleep apnea   . Venous stasis dermatitis of both lower extremities    Reviewed med, allergy, problem list.      Observations/Objective: No acute distress. Normal mood.  No labored breathing.   .. Today's Vitals   10/22/19 1418  BP: 137/67  Pulse: (!) 103  Weight: 300 lb (136.1 kg)  Height: 5' (1.524 m)   Body mass index is 58.59 kg/m.    Assessment and Plan: [name] KitchenMarland KitchenDiagnoses and all orders for this visit:  Type 2 diabetes mellitus with hyperglycemia, without long-term current use of insulin (HCC) -     Hemoglobin A1c -     Lipid Panel w/reflex Direct LDL -     COMPLETE METABOLIC PANEL WITH GFR -     CBC with Differential/Platelet -     Fe+TIBC+Fer  Stage 3a chronic kidney disease -     COMPLETE METABOLIC PANEL WITH GFR  Iron deficiency anemia secondary to inadequate dietary iron intake -     CBC with Differential/Platelet -     Fe+TIBC+Fer  Anxiety -     Discontinue: ALPRAZolam (XANAX) 0.5 MG tablet; Take 1 tablet (0.5 mg total) by mouth at bedtime as needed for anxiety. -     ALPRAZolam (XANAX) 1 MG tablet; Take one half tablet once a day as needed for anxiety.  Chronic right shoulder pain -     predniSONE (DELTASONE) 5 MG tablet; Take 1 tablet (5 mg total) by mouth daily with breakfast. -     HYDROcodone-acetaminophen (NORCO/VICODIN) 5-325 MG tablet; Take 1 tablet by mouth every 8 (eight) hours as needed for moderate pain.  Chronic pain of both knees -     predniSONE (DELTASONE) 5 MG tablet; Take 1 tablet (5 mg total) by mouth daily with breakfast. -      HYDROcodone-acetaminophen (NORCO/VICODIN) 5-325 MG tablet; Take 1 tablet by mouth every 8 (eight) hours as needed for moderate pain. -     predniSONE (DELTASONE) 5 MG tablet; Take 1 tablet (5 mg total) by mouth daily with breakfast.  Pain management contract agreement -     HYDROcodone-acetaminophen (NORCO/VICODIN) 5-325 MG tablet; Take 1 tablet by mouth every 8 (eight) hours as needed for moderate pain.  Bilateral foot pain -     predniSONE (DELTASONE) 5 MG tablet; Take 1 tablet (5 mg total) by mouth daily with breakfast. -     predniSONE (DELTASONE) 5 MG tablet; Take 1 tablet (5 mg total) by mouth daily with breakfast.  Gastroesophageal reflux disease, unspecified whether esophagitis present -     famotidine (PEPCID) 20 MG tablet; Take 1 tablet (20 mg total) by mouth 2 (two) times daily.   Pt needs labs. Ordered today.   [name] Kitchen.PDMP reviewed during this encounter. No  concerns.  Pain contract is UTD.   Discussed prednisone usage and risk and side effects. Dr. Georgina Snell was giving it to her as needed. Gave lower dose but to still use in burst not daily.   She has a new podiatrist she hopes to have appt with in near future.    Follow Up Instructions:    I discussed the assessment and treatment plan with the patient. The patient was provided an opportunity to ask questions and all were answered. The patient agreed with the plan and demonstrated an understanding of the instructions.   The patient was advised to call back or seek an in-person evaluation if the symptoms worsen or if the condition fails to improve as anticipated.  I provided 15 minutes of non-face-to-face time during this encounter.   Iran Planas, PA-C

## 2019-10-24 MED ORDER — ALPRAZOLAM 1 MG PO TABS: ORAL_TABLET | ORAL | 5 refills | Status: DC

## 2019-11-08 ENCOUNTER — Encounter: Payer: Self-pay | Admitting: Physician Assistant

## 2019-11-30 DIAGNOSIS — D631 Anemia in chronic kidney disease: Secondary | ICD-10-CM | POA: Diagnosis not present

## 2019-11-30 DIAGNOSIS — I10 Essential (primary) hypertension: Secondary | ICD-10-CM | POA: Diagnosis not present

## 2019-11-30 DIAGNOSIS — N183 Chronic kidney disease, stage 3 unspecified: Secondary | ICD-10-CM | POA: Diagnosis not present

## 2019-11-30 DIAGNOSIS — R300 Dysuria: Secondary | ICD-10-CM | POA: Diagnosis not present

## 2019-12-03 DIAGNOSIS — G5602 Carpal tunnel syndrome, left upper limb: Secondary | ICD-10-CM | POA: Diagnosis not present

## 2019-12-03 DIAGNOSIS — G5601 Carpal tunnel syndrome, right upper limb: Secondary | ICD-10-CM | POA: Diagnosis not present

## 2019-12-04 ENCOUNTER — Telehealth: Payer: Self-pay | Admitting: Physician Assistant

## 2019-12-04 DIAGNOSIS — M25562 Pain in left knee: Secondary | ICD-10-CM

## 2019-12-04 DIAGNOSIS — M25511 Pain in right shoulder: Secondary | ICD-10-CM

## 2019-12-04 DIAGNOSIS — Z0289 Encounter for other administrative examinations: Secondary | ICD-10-CM

## 2019-12-04 DIAGNOSIS — G8929 Other chronic pain: Secondary | ICD-10-CM

## 2019-12-04 MED ORDER — HYDROCODONE-ACETAMINOPHEN 5-325 MG PO TABS: 1.0000 | ORAL_TABLET | Freq: Three times a day (TID) | ORAL | 0 refills | Status: DC | PRN

## 2019-12-04 NOTE — Telephone Encounter (Signed)
Xanax sent 10/24/2019 for 6 month supply.   Hydrocodone last refilled 10/26/2019 #60 with no refills. Can refill now.

## 2019-12-04 NOTE — Telephone Encounter (Signed)
Ok sent!

## 2019-12-04 NOTE — Telephone Encounter (Signed)
Patient is requesting a refill on medications listed below but stated she won't need it until the end of the month, please advise.  HYDROcodone-acetaminophen (NORCO/VICODIN) 5-325 MG tablet [id number]    ALPRAZolam (XANAX) 1 MG tablet [id number]

## 2019-12-04 NOTE — Telephone Encounter (Signed)
Maybe we could make a note to send later this month?

## 2019-12-06 ENCOUNTER — Other Ambulatory Visit: Payer: Self-pay

## 2019-12-06 LAB — CBC WITH DIFFERENTIAL/PLATELET
Absolute Monocytes: 490 cells/uL (ref 200–950)
Basophils Absolute: 17 cells/uL (ref 0–200)
Basophils Relative: 0.2 %
Eosinophils Absolute: 241 cells/uL (ref 15–500)
Eosinophils Relative: 2.8 %
HCT: 30.8 % — ABNORMAL LOW (ref 35.0–45.0)
Hemoglobin: 10.2 g/dL — ABNORMAL LOW (ref 11.7–15.5)
Lymphs Abs: 1135 cells/uL (ref 850–3900)
MCH: 31.1 pg (ref 27.0–33.0)
MCHC: 33.1 g/dL (ref 32.0–36.0)
MCV: 93.9 fL (ref 80.0–100.0)
MPV: 10.9 fL (ref 7.5–12.5)
Monocytes Relative: 5.7 %
Neutro Abs: 6717 cells/uL (ref 1500–7800)
Neutrophils Relative %: 78.1 %
Platelets: 208 10*3/uL (ref 140–400)
RBC: 3.28 10*6/uL — ABNORMAL LOW (ref 3.80–5.10)
RDW: 13.4 % (ref 11.0–15.0)
Total Lymphocyte: 13.2 %
WBC: 8.6 10*3/uL (ref 3.8–10.8)

## 2019-12-06 LAB — HEMOGLOBIN A1C
Hgb A1c MFr Bld: 5.2 % of total Hgb (ref <5.7)
Mean Plasma Glucose: 103 (calc)
eAG (mmol/L): 5.7 (calc)

## 2019-12-06 LAB — LIPID PANEL W/REFLEX DIRECT LDL
Cholesterol: 198 mg/dL (ref <200)
HDL: 41 mg/dL — ABNORMAL LOW (ref >=50)
LDL Cholesterol (Calc): 118 mg/dL (calc) — ABNORMAL HIGH
Non-HDL Cholesterol (Calc): 157 mg/dL (calc) — ABNORMAL HIGH (ref <130)
Total CHOL/HDL Ratio: 4.8 (calc) (ref <5.0)
Triglycerides: 293 mg/dL — ABNORMAL HIGH (ref <150)

## 2019-12-06 LAB — COMPLETE METABOLIC PANEL WITH GFR
AG Ratio: 1.2 (calc) (ref 1.0–2.5)
ALT: 14 U/L (ref 6–29)
AST: 13 U/L (ref 10–35)
Albumin: 3.5 g/dL — ABNORMAL LOW (ref 3.6–5.1)
Alkaline phosphatase (APISO): 93 U/L (ref 37–153)
BUN/Creatinine Ratio: 21 (calc) (ref 6–22)
BUN: 30 mg/dL — ABNORMAL HIGH (ref 7–25)
CO2: 26 mmol/L (ref 20–32)
Calcium: 9.2 mg/dL (ref 8.6–10.4)
Chloride: 101 mmol/L (ref 98–110)
Creat: 1.42 mg/dL — ABNORMAL HIGH (ref 0.50–0.99)
GFR, Est African American: 44 mL/min/{1.73_m2} — ABNORMAL LOW (ref >=60)
GFR, Est Non African American: 38 mL/min/{1.73_m2} — ABNORMAL LOW (ref >=60)
Globulin: 2.9 g/dL (calc) (ref 1.9–3.7)
Glucose, Bld: 53 mg/dL — ABNORMAL LOW (ref 65–99)
Potassium: 4.5 mmol/L (ref 3.5–5.3)
Sodium: 136 mmol/L (ref 135–146)
Total Bilirubin: 0.5 mg/dL (ref 0.2–1.2)
Total Protein: 6.4 g/dL (ref 6.1–8.1)

## 2019-12-06 LAB — IRON,TIBC AND FERRITIN PANEL
%SAT: 17 % (calc) (ref 16–45)
Ferritin: 218 ng/mL (ref 16–288)
Iron: 45 ug/dL (ref 45–160)
TIBC: 259 mcg/dL (calc) (ref 250–450)

## 2019-12-06 MED ORDER — GLUCOSE BLOOD VI STRP: ORAL_STRIP | 99 refills | Status: DC

## 2019-12-07 MED ORDER — ROSUVASTATIN CALCIUM 5 MG PO TABS: 5.0000 mg | ORAL_TABLET | Freq: Every day | ORAL | 3 refills | Status: DC

## 2019-12-07 NOTE — Addendum Note (Signed)
Addended by: Donella Stade on: 12/07/2019 03:11 PM   Modules accepted: Orders

## 2019-12-07 NOTE — Progress Notes (Signed)
Call pt:  A1C is great! Kidney function is stable.  Albumin is a little low make sure getting adequate protein and nutrition.  Hgb better than normal. Iron panel looks much better.  Cholesterol is not to goal. I would like to try other statins to see if you tolerate and get to goal. What are your thoughts? I know you tried lovastatin but have you tried any others. Also we could even see if taking everyother day decreases side effects but still able to benefit you with CV reduction.

## 2019-12-07 NOTE — Progress Notes (Signed)
Sent crestor.

## 2019-12-25 DIAGNOSIS — I959 Hypotension, unspecified: Secondary | ICD-10-CM | POA: Diagnosis not present

## 2019-12-25 DIAGNOSIS — M5489 Other dorsalgia: Secondary | ICD-10-CM | POA: Diagnosis not present

## 2019-12-25 DIAGNOSIS — E162 Hypoglycemia, unspecified: Secondary | ICD-10-CM | POA: Diagnosis not present

## 2019-12-25 DIAGNOSIS — E161 Other hypoglycemia: Secondary | ICD-10-CM | POA: Diagnosis not present

## 2019-12-25 DIAGNOSIS — R52 Pain, unspecified: Secondary | ICD-10-CM | POA: Diagnosis not present

## 2019-12-26 ENCOUNTER — Telehealth: Payer: Self-pay | Admitting: Neurology

## 2019-12-26 DIAGNOSIS — M25562 Pain in left knee: Secondary | ICD-10-CM

## 2019-12-26 DIAGNOSIS — Z0289 Encounter for other administrative examinations: Secondary | ICD-10-CM

## 2019-12-26 DIAGNOSIS — G8929 Other chronic pain: Secondary | ICD-10-CM

## 2019-12-26 NOTE — Telephone Encounter (Signed)
Patient in [facility] hospital with infection in her legs. She called to get refill of hydrocodone since she thinks she will be discharged this weekend.  She received last refill 12/04/2019 #60 with no refills.  She states she has been taking more than prescribed, a total of 3 tablets daily, due to leg pain. I did let her know if she takes more than we prescribe she needs to contact us.  She is on pain contract, but looks like it hasn't been updated since June 2019.  Please advise.

## 2019-12-27 MED ORDER — HYDROCODONE-ACETAMINOPHEN 5-325 MG PO TABS: 1.0000 | ORAL_TABLET | Freq: Three times a day (TID) | ORAL | 0 refills | Status: DC | PRN

## 2019-12-27 NOTE — Telephone Encounter (Signed)
Sent!

## 2019-12-31 DIAGNOSIS — K7581 Nonalcoholic steatohepatitis (NASH): Secondary | ICD-10-CM | POA: Diagnosis not present

## 2019-12-31 DIAGNOSIS — I872 Venous insufficiency (chronic) (peripheral): Secondary | ICD-10-CM | POA: Diagnosis not present

## 2019-12-31 DIAGNOSIS — K717 Toxic liver disease with fibrosis and cirrhosis of liver: Secondary | ICD-10-CM | POA: Diagnosis not present

## 2019-12-31 DIAGNOSIS — E119 Type 2 diabetes mellitus without complications: Secondary | ICD-10-CM | POA: Diagnosis not present

## 2019-12-31 DIAGNOSIS — J449 Chronic obstructive pulmonary disease, unspecified: Secondary | ICD-10-CM | POA: Diagnosis not present

## 2019-12-31 DIAGNOSIS — L03116 Cellulitis of left lower limb: Secondary | ICD-10-CM | POA: Diagnosis not present

## 2019-12-31 DIAGNOSIS — R296 Repeated falls: Secondary | ICD-10-CM | POA: Diagnosis not present

## 2019-12-31 DIAGNOSIS — G8929 Other chronic pain: Secondary | ICD-10-CM | POA: Diagnosis not present

## 2019-12-31 DIAGNOSIS — L03115 Cellulitis of right lower limb: Secondary | ICD-10-CM | POA: Diagnosis not present

## 2020-01-01 DIAGNOSIS — D631 Anemia in chronic kidney disease: Secondary | ICD-10-CM | POA: Diagnosis present

## 2020-01-01 DIAGNOSIS — L03115 Cellulitis of right lower limb: Secondary | ICD-10-CM | POA: Diagnosis not present

## 2020-01-01 DIAGNOSIS — E1142 Type 2 diabetes mellitus with diabetic polyneuropathy: Secondary | ICD-10-CM | POA: Diagnosis not present

## 2020-01-01 DIAGNOSIS — R339 Retention of urine, unspecified: Secondary | ICD-10-CM | POA: Diagnosis present

## 2020-01-01 DIAGNOSIS — R52 Pain, unspecified: Secondary | ICD-10-CM | POA: Diagnosis not present

## 2020-01-01 DIAGNOSIS — E1136 Type 2 diabetes mellitus with diabetic cataract: Secondary | ICD-10-CM | POA: Diagnosis present

## 2020-01-01 DIAGNOSIS — E1169 Type 2 diabetes mellitus with other specified complication: Secondary | ICD-10-CM | POA: Diagnosis not present

## 2020-01-01 DIAGNOSIS — K567 Ileus, unspecified: Secondary | ICD-10-CM | POA: Diagnosis not present

## 2020-01-01 DIAGNOSIS — J069 Acute upper respiratory infection, unspecified: Secondary | ICD-10-CM | POA: Diagnosis not present

## 2020-01-01 DIAGNOSIS — N2581 Secondary hyperparathyroidism of renal origin: Secondary | ICD-10-CM | POA: Diagnosis present

## 2020-01-01 DIAGNOSIS — E1165 Type 2 diabetes mellitus with hyperglycemia: Secondary | ICD-10-CM | POA: Diagnosis not present

## 2020-01-01 DIAGNOSIS — K7581 Nonalcoholic steatohepatitis (NASH): Secondary | ICD-10-CM | POA: Diagnosis present

## 2020-01-01 DIAGNOSIS — R609 Edema, unspecified: Secondary | ICD-10-CM | POA: Diagnosis not present

## 2020-01-01 DIAGNOSIS — R1312 Dysphagia, oropharyngeal phase: Secondary | ICD-10-CM | POA: Diagnosis not present

## 2020-01-01 DIAGNOSIS — J984 Other disorders of lung: Secondary | ICD-10-CM | POA: Diagnosis not present

## 2020-01-01 DIAGNOSIS — Z23 Encounter for immunization: Secondary | ICD-10-CM | POA: Diagnosis not present

## 2020-01-01 DIAGNOSIS — E785 Hyperlipidemia, unspecified: Secondary | ICD-10-CM | POA: Diagnosis not present

## 2020-01-01 DIAGNOSIS — R0902 Hypoxemia: Secondary | ICD-10-CM | POA: Diagnosis not present

## 2020-01-01 DIAGNOSIS — L89153 Pressure ulcer of sacral region, stage 3: Secondary | ICD-10-CM | POA: Diagnosis present

## 2020-01-01 DIAGNOSIS — E559 Vitamin D deficiency, unspecified: Secondary | ICD-10-CM | POA: Diagnosis not present

## 2020-01-01 DIAGNOSIS — M153 Secondary multiple arthritis: Secondary | ICD-10-CM | POA: Diagnosis not present

## 2020-01-01 DIAGNOSIS — R531 Weakness: Secondary | ICD-10-CM | POA: Diagnosis not present

## 2020-01-01 DIAGNOSIS — E871 Hypo-osmolality and hyponatremia: Secondary | ICD-10-CM | POA: Diagnosis not present

## 2020-01-01 DIAGNOSIS — L8991 Pressure ulcer of unspecified site, stage 1: Secondary | ICD-10-CM | POA: Diagnosis not present

## 2020-01-01 DIAGNOSIS — K5909 Other constipation: Secondary | ICD-10-CM | POA: Diagnosis not present

## 2020-01-01 DIAGNOSIS — E790 Hyperuricemia without signs of inflammatory arthritis and tophaceous disease: Secondary | ICD-10-CM | POA: Diagnosis not present

## 2020-01-01 DIAGNOSIS — L89313 Pressure ulcer of right buttock, stage 3: Secondary | ICD-10-CM | POA: Diagnosis not present

## 2020-01-01 DIAGNOSIS — K7469 Other cirrhosis of liver: Secondary | ICD-10-CM | POA: Diagnosis not present

## 2020-01-01 DIAGNOSIS — L03116 Cellulitis of left lower limb: Secondary | ICD-10-CM | POA: Diagnosis present

## 2020-01-01 DIAGNOSIS — M79675 Pain in left toe(s): Secondary | ICD-10-CM | POA: Diagnosis not present

## 2020-01-01 DIAGNOSIS — Z9119 Patient's noncompliance with other medical treatment and regimen: Secondary | ICD-10-CM | POA: Diagnosis not present

## 2020-01-01 DIAGNOSIS — I878 Other specified disorders of veins: Secondary | ICD-10-CM | POA: Diagnosis present

## 2020-01-01 DIAGNOSIS — N390 Urinary tract infection, site not specified: Secondary | ICD-10-CM | POA: Diagnosis not present

## 2020-01-01 DIAGNOSIS — R1084 Generalized abdominal pain: Secondary | ICD-10-CM | POA: Diagnosis not present

## 2020-01-01 DIAGNOSIS — F320 Major depressive disorder, single episode, mild: Secondary | ICD-10-CM | POA: Diagnosis not present

## 2020-01-01 DIAGNOSIS — J441 Chronic obstructive pulmonary disease with (acute) exacerbation: Secondary | ICD-10-CM | POA: Diagnosis not present

## 2020-01-01 DIAGNOSIS — G8929 Other chronic pain: Secondary | ICD-10-CM | POA: Diagnosis present

## 2020-01-01 DIAGNOSIS — N183 Chronic kidney disease, stage 3 unspecified: Secondary | ICD-10-CM | POA: Diagnosis present

## 2020-01-01 DIAGNOSIS — E1149 Type 2 diabetes mellitus with other diabetic neurological complication: Secondary | ICD-10-CM | POA: Diagnosis not present

## 2020-01-01 DIAGNOSIS — E878 Other disorders of electrolyte and fluid balance, not elsewhere classified: Secondary | ICD-10-CM | POA: Diagnosis not present

## 2020-01-01 DIAGNOSIS — F411 Generalized anxiety disorder: Secondary | ICD-10-CM | POA: Diagnosis present

## 2020-01-01 DIAGNOSIS — Z79899 Other long term (current) drug therapy: Secondary | ICD-10-CM | POA: Diagnosis not present

## 2020-01-01 DIAGNOSIS — J449 Chronic obstructive pulmonary disease, unspecified: Secondary | ICD-10-CM | POA: Diagnosis present

## 2020-01-01 DIAGNOSIS — W19XXXA Unspecified fall, initial encounter: Secondary | ICD-10-CM | POA: Diagnosis not present

## 2020-01-01 DIAGNOSIS — K7460 Unspecified cirrhosis of liver: Secondary | ICD-10-CM | POA: Diagnosis present

## 2020-01-01 DIAGNOSIS — F329 Major depressive disorder, single episode, unspecified: Secondary | ICD-10-CM | POA: Diagnosis not present

## 2020-01-01 DIAGNOSIS — Z6841 Body Mass Index (BMI) 40.0 and over, adult: Secondary | ICD-10-CM | POA: Diagnosis not present

## 2020-01-01 DIAGNOSIS — B3749 Other urogenital candidiasis: Secondary | ICD-10-CM | POA: Diagnosis not present

## 2020-01-01 DIAGNOSIS — Z8679 Personal history of other diseases of the circulatory system: Secondary | ICD-10-CM | POA: Diagnosis not present

## 2020-01-01 DIAGNOSIS — I890 Lymphedema, not elsewhere classified: Secondary | ICD-10-CM | POA: Diagnosis present

## 2020-01-01 DIAGNOSIS — L03119 Cellulitis of unspecified part of limb: Secondary | ICD-10-CM | POA: Diagnosis not present

## 2020-01-01 DIAGNOSIS — E669 Obesity, unspecified: Secondary | ICD-10-CM | POA: Diagnosis present

## 2020-01-01 DIAGNOSIS — Z794 Long term (current) use of insulin: Secondary | ICD-10-CM | POA: Diagnosis not present

## 2020-01-01 DIAGNOSIS — M79672 Pain in left foot: Secondary | ICD-10-CM | POA: Diagnosis not present

## 2020-01-01 DIAGNOSIS — N1832 Chronic kidney disease, stage 3b: Secondary | ICD-10-CM | POA: Diagnosis not present

## 2020-01-01 DIAGNOSIS — E039 Hypothyroidism, unspecified: Secondary | ICD-10-CM | POA: Diagnosis not present

## 2020-01-01 DIAGNOSIS — D6489 Other specified anemias: Secondary | ICD-10-CM | POA: Diagnosis not present

## 2020-01-01 DIAGNOSIS — Z20822 Contact with and (suspected) exposure to covid-19: Secondary | ICD-10-CM | POA: Diagnosis present

## 2020-01-01 DIAGNOSIS — L89202 Pressure ulcer of unspecified hip, stage 2: Secondary | ICD-10-CM | POA: Diagnosis not present

## 2020-01-01 DIAGNOSIS — M1388 Other specified arthritis, other site: Secondary | ICD-10-CM | POA: Diagnosis not present

## 2020-01-01 DIAGNOSIS — R0781 Pleurodynia: Secondary | ICD-10-CM | POA: Diagnosis not present

## 2020-01-01 DIAGNOSIS — R601 Generalized edema: Secondary | ICD-10-CM | POA: Diagnosis not present

## 2020-01-01 DIAGNOSIS — L0390 Cellulitis, unspecified: Secondary | ICD-10-CM | POA: Diagnosis not present

## 2020-01-01 DIAGNOSIS — E119 Type 2 diabetes mellitus without complications: Secondary | ICD-10-CM | POA: Diagnosis not present

## 2020-01-01 DIAGNOSIS — D519 Vitamin B12 deficiency anemia, unspecified: Secondary | ICD-10-CM | POA: Diagnosis not present

## 2020-01-01 DIAGNOSIS — M6281 Muscle weakness (generalized): Secondary | ICD-10-CM | POA: Diagnosis not present

## 2020-01-01 DIAGNOSIS — I959 Hypotension, unspecified: Secondary | ICD-10-CM | POA: Diagnosis not present

## 2020-01-01 DIAGNOSIS — Z9181 History of falling: Secondary | ICD-10-CM | POA: Diagnosis not present

## 2020-01-01 DIAGNOSIS — R109 Unspecified abdominal pain: Secondary | ICD-10-CM | POA: Diagnosis not present

## 2020-01-01 DIAGNOSIS — D649 Anemia, unspecified: Secondary | ICD-10-CM | POA: Diagnosis not present

## 2020-01-01 DIAGNOSIS — L98499 Non-pressure chronic ulcer of skin of other sites with unspecified severity: Secondary | ICD-10-CM | POA: Diagnosis not present

## 2020-01-01 DIAGNOSIS — M25562 Pain in left knee: Secondary | ICD-10-CM | POA: Diagnosis not present

## 2020-01-01 DIAGNOSIS — N189 Chronic kidney disease, unspecified: Secondary | ICD-10-CM | POA: Diagnosis not present

## 2020-01-01 DIAGNOSIS — G4733 Obstructive sleep apnea (adult) (pediatric): Secondary | ICD-10-CM | POA: Diagnosis present

## 2020-01-01 DIAGNOSIS — N179 Acute kidney failure, unspecified: Secondary | ICD-10-CM | POA: Diagnosis not present

## 2020-01-01 DIAGNOSIS — I10 Essential (primary) hypertension: Secondary | ICD-10-CM | POA: Diagnosis not present

## 2020-01-01 DIAGNOSIS — Z20828 Contact with and (suspected) exposure to other viral communicable diseases: Secondary | ICD-10-CM | POA: Diagnosis not present

## 2020-01-01 DIAGNOSIS — R300 Dysuria: Secondary | ICD-10-CM | POA: Diagnosis not present

## 2020-01-01 DIAGNOSIS — R41841 Cognitive communication deficit: Secondary | ICD-10-CM | POA: Diagnosis not present

## 2020-01-01 DIAGNOSIS — L89323 Pressure ulcer of left buttock, stage 3: Secondary | ICD-10-CM | POA: Diagnosis not present

## 2020-01-01 DIAGNOSIS — N1831 Chronic kidney disease, stage 3a: Secondary | ICD-10-CM | POA: Diagnosis not present

## 2020-01-01 DIAGNOSIS — R29818 Other symptoms and signs involving the nervous system: Secondary | ICD-10-CM | POA: Diagnosis not present

## 2020-01-01 DIAGNOSIS — L299 Pruritus, unspecified: Secondary | ICD-10-CM | POA: Diagnosis not present

## 2020-01-01 DIAGNOSIS — K5900 Constipation, unspecified: Secondary | ICD-10-CM | POA: Diagnosis present

## 2020-01-01 DIAGNOSIS — L298 Other pruritus: Secondary | ICD-10-CM | POA: Diagnosis not present

## 2020-01-01 DIAGNOSIS — K219 Gastro-esophageal reflux disease without esophagitis: Secondary | ICD-10-CM | POA: Diagnosis not present

## 2020-01-01 DIAGNOSIS — R809 Proteinuria, unspecified: Secondary | ICD-10-CM | POA: Diagnosis not present

## 2020-01-01 DIAGNOSIS — R32 Unspecified urinary incontinence: Secondary | ICD-10-CM | POA: Diagnosis not present

## 2020-01-01 DIAGNOSIS — E1122 Type 2 diabetes mellitus with diabetic chronic kidney disease: Secondary | ICD-10-CM | POA: Diagnosis present

## 2020-01-01 DIAGNOSIS — K717 Toxic liver disease with fibrosis and cirrhosis of liver: Secondary | ICD-10-CM | POA: Diagnosis not present

## 2020-01-01 DIAGNOSIS — Z7189 Other specified counseling: Secondary | ICD-10-CM | POA: Diagnosis not present

## 2020-01-01 DIAGNOSIS — R296 Repeated falls: Secondary | ICD-10-CM | POA: Diagnosis not present

## 2020-01-01 DIAGNOSIS — I872 Venous insufficiency (chronic) (peripheral): Secondary | ICD-10-CM | POA: Diagnosis present

## 2020-01-01 DIAGNOSIS — I129 Hypertensive chronic kidney disease with stage 1 through stage 4 chronic kidney disease, or unspecified chronic kidney disease: Secondary | ICD-10-CM | POA: Diagnosis present

## 2020-01-02 DIAGNOSIS — J449 Chronic obstructive pulmonary disease, unspecified: Secondary | ICD-10-CM | POA: Diagnosis not present

## 2020-01-02 DIAGNOSIS — I872 Venous insufficiency (chronic) (peripheral): Secondary | ICD-10-CM | POA: Diagnosis not present

## 2020-01-02 DIAGNOSIS — E119 Type 2 diabetes mellitus without complications: Secondary | ICD-10-CM | POA: Diagnosis not present

## 2020-01-02 DIAGNOSIS — K219 Gastro-esophageal reflux disease without esophagitis: Secondary | ICD-10-CM | POA: Diagnosis not present

## 2020-01-03 DIAGNOSIS — E119 Type 2 diabetes mellitus without complications: Secondary | ICD-10-CM | POA: Diagnosis not present

## 2020-01-03 DIAGNOSIS — K219 Gastro-esophageal reflux disease without esophagitis: Secondary | ICD-10-CM | POA: Diagnosis not present

## 2020-01-03 DIAGNOSIS — J984 Other disorders of lung: Secondary | ICD-10-CM | POA: Diagnosis not present

## 2020-01-03 DIAGNOSIS — N1831 Chronic kidney disease, stage 3a: Secondary | ICD-10-CM | POA: Diagnosis not present

## 2020-01-03 DIAGNOSIS — I872 Venous insufficiency (chronic) (peripheral): Secondary | ICD-10-CM | POA: Diagnosis not present

## 2020-01-03 DIAGNOSIS — F320 Major depressive disorder, single episode, mild: Secondary | ICD-10-CM | POA: Diagnosis not present

## 2020-01-03 DIAGNOSIS — M1388 Other specified arthritis, other site: Secondary | ICD-10-CM | POA: Diagnosis not present

## 2020-01-04 DIAGNOSIS — N1831 Chronic kidney disease, stage 3a: Secondary | ICD-10-CM | POA: Diagnosis not present

## 2020-01-04 DIAGNOSIS — M153 Secondary multiple arthritis: Secondary | ICD-10-CM | POA: Diagnosis not present

## 2020-01-04 DIAGNOSIS — J984 Other disorders of lung: Secondary | ICD-10-CM | POA: Diagnosis not present

## 2020-01-04 DIAGNOSIS — K7460 Unspecified cirrhosis of liver: Secondary | ICD-10-CM | POA: Diagnosis not present

## 2020-01-04 DIAGNOSIS — K7581 Nonalcoholic steatohepatitis (NASH): Secondary | ICD-10-CM | POA: Diagnosis not present

## 2020-01-04 DIAGNOSIS — Z7189 Other specified counseling: Secondary | ICD-10-CM | POA: Diagnosis not present

## 2020-01-04 DIAGNOSIS — I872 Venous insufficiency (chronic) (peripheral): Secondary | ICD-10-CM | POA: Diagnosis not present

## 2020-01-04 DIAGNOSIS — K219 Gastro-esophageal reflux disease without esophagitis: Secondary | ICD-10-CM | POA: Diagnosis not present

## 2020-01-04 DIAGNOSIS — F320 Major depressive disorder, single episode, mild: Secondary | ICD-10-CM | POA: Diagnosis not present

## 2020-01-04 DIAGNOSIS — E1169 Type 2 diabetes mellitus with other specified complication: Secondary | ICD-10-CM | POA: Diagnosis not present

## 2020-01-04 DIAGNOSIS — L03115 Cellulitis of right lower limb: Secondary | ICD-10-CM | POA: Diagnosis not present

## 2020-01-07 DIAGNOSIS — E119 Type 2 diabetes mellitus without complications: Secondary | ICD-10-CM | POA: Diagnosis not present

## 2020-01-07 DIAGNOSIS — K7581 Nonalcoholic steatohepatitis (NASH): Secondary | ICD-10-CM | POA: Diagnosis not present

## 2020-01-07 DIAGNOSIS — M1388 Other specified arthritis, other site: Secondary | ICD-10-CM | POA: Diagnosis not present

## 2020-01-07 DIAGNOSIS — N1831 Chronic kidney disease, stage 3a: Secondary | ICD-10-CM | POA: Diagnosis not present

## 2020-01-07 DIAGNOSIS — E1149 Type 2 diabetes mellitus with other diabetic neurological complication: Secondary | ICD-10-CM | POA: Diagnosis not present

## 2020-01-11 DIAGNOSIS — Z794 Long term (current) use of insulin: Secondary | ICD-10-CM | POA: Diagnosis not present

## 2020-01-11 DIAGNOSIS — J449 Chronic obstructive pulmonary disease, unspecified: Secondary | ICD-10-CM | POA: Diagnosis not present

## 2020-01-11 DIAGNOSIS — E1165 Type 2 diabetes mellitus with hyperglycemia: Secondary | ICD-10-CM | POA: Diagnosis not present

## 2020-01-11 DIAGNOSIS — E1142 Type 2 diabetes mellitus with diabetic polyneuropathy: Secondary | ICD-10-CM | POA: Diagnosis not present

## 2020-01-16 DIAGNOSIS — L298 Other pruritus: Secondary | ICD-10-CM | POA: Diagnosis not present

## 2020-01-16 DIAGNOSIS — M1388 Other specified arthritis, other site: Secondary | ICD-10-CM | POA: Diagnosis not present

## 2020-01-16 DIAGNOSIS — J984 Other disorders of lung: Secondary | ICD-10-CM | POA: Diagnosis not present

## 2020-01-16 DIAGNOSIS — E1169 Type 2 diabetes mellitus with other specified complication: Secondary | ICD-10-CM | POA: Diagnosis not present

## 2020-01-16 DIAGNOSIS — R29818 Other symptoms and signs involving the nervous system: Secondary | ICD-10-CM | POA: Diagnosis not present

## 2020-01-16 DIAGNOSIS — N1831 Chronic kidney disease, stage 3a: Secondary | ICD-10-CM | POA: Diagnosis not present

## 2020-01-18 DIAGNOSIS — M1388 Other specified arthritis, other site: Secondary | ICD-10-CM | POA: Diagnosis not present

## 2020-01-18 DIAGNOSIS — E1165 Type 2 diabetes mellitus with hyperglycemia: Secondary | ICD-10-CM | POA: Diagnosis not present

## 2020-01-18 DIAGNOSIS — K5909 Other constipation: Secondary | ICD-10-CM | POA: Diagnosis not present

## 2020-01-18 DIAGNOSIS — N1831 Chronic kidney disease, stage 3a: Secondary | ICD-10-CM | POA: Diagnosis not present

## 2020-01-18 DIAGNOSIS — D6489 Other specified anemias: Secondary | ICD-10-CM | POA: Diagnosis not present

## 2020-01-22 DIAGNOSIS — J984 Other disorders of lung: Secondary | ICD-10-CM | POA: Diagnosis not present

## 2020-01-22 DIAGNOSIS — K219 Gastro-esophageal reflux disease without esophagitis: Secondary | ICD-10-CM | POA: Diagnosis not present

## 2020-01-22 DIAGNOSIS — K5909 Other constipation: Secondary | ICD-10-CM | POA: Diagnosis not present

## 2020-01-22 DIAGNOSIS — M1388 Other specified arthritis, other site: Secondary | ICD-10-CM | POA: Diagnosis not present

## 2020-01-22 DIAGNOSIS — E1165 Type 2 diabetes mellitus with hyperglycemia: Secondary | ICD-10-CM | POA: Diagnosis not present

## 2020-01-25 DIAGNOSIS — N183 Chronic kidney disease, stage 3 unspecified: Secondary | ICD-10-CM | POA: Diagnosis not present

## 2020-01-25 DIAGNOSIS — K219 Gastro-esophageal reflux disease without esophagitis: Secondary | ICD-10-CM | POA: Diagnosis not present

## 2020-01-25 DIAGNOSIS — E1122 Type 2 diabetes mellitus with diabetic chronic kidney disease: Secondary | ICD-10-CM | POA: Diagnosis not present

## 2020-01-25 DIAGNOSIS — K7460 Unspecified cirrhosis of liver: Secondary | ICD-10-CM | POA: Diagnosis not present

## 2020-01-25 DIAGNOSIS — E1165 Type 2 diabetes mellitus with hyperglycemia: Secondary | ICD-10-CM | POA: Diagnosis not present

## 2020-01-25 DIAGNOSIS — N1832 Chronic kidney disease, stage 3b: Secondary | ICD-10-CM | POA: Diagnosis not present

## 2020-01-25 DIAGNOSIS — I129 Hypertensive chronic kidney disease with stage 1 through stage 4 chronic kidney disease, or unspecified chronic kidney disease: Secondary | ICD-10-CM | POA: Diagnosis not present

## 2020-01-25 DIAGNOSIS — N179 Acute kidney failure, unspecified: Secondary | ICD-10-CM | POA: Diagnosis not present

## 2020-01-25 DIAGNOSIS — D631 Anemia in chronic kidney disease: Secondary | ICD-10-CM | POA: Diagnosis not present

## 2020-01-25 DIAGNOSIS — R809 Proteinuria, unspecified: Secondary | ICD-10-CM | POA: Diagnosis not present

## 2020-01-25 DIAGNOSIS — R32 Unspecified urinary incontinence: Secondary | ICD-10-CM | POA: Diagnosis not present

## 2020-01-25 DIAGNOSIS — J449 Chronic obstructive pulmonary disease, unspecified: Secondary | ICD-10-CM | POA: Diagnosis not present

## 2020-01-28 DIAGNOSIS — J069 Acute upper respiratory infection, unspecified: Secondary | ICD-10-CM | POA: Diagnosis not present

## 2020-01-28 DIAGNOSIS — Z20828 Contact with and (suspected) exposure to other viral communicable diseases: Secondary | ICD-10-CM | POA: Diagnosis not present

## 2020-01-29 DIAGNOSIS — K219 Gastro-esophageal reflux disease without esophagitis: Secondary | ICD-10-CM | POA: Diagnosis not present

## 2020-01-29 DIAGNOSIS — E1169 Type 2 diabetes mellitus with other specified complication: Secondary | ICD-10-CM | POA: Diagnosis not present

## 2020-01-29 DIAGNOSIS — E1165 Type 2 diabetes mellitus with hyperglycemia: Secondary | ICD-10-CM | POA: Diagnosis not present

## 2020-01-29 DIAGNOSIS — L8991 Pressure ulcer of unspecified site, stage 1: Secondary | ICD-10-CM | POA: Diagnosis not present

## 2020-01-30 DIAGNOSIS — K7581 Nonalcoholic steatohepatitis (NASH): Secondary | ICD-10-CM | POA: Diagnosis not present

## 2020-01-30 DIAGNOSIS — E1165 Type 2 diabetes mellitus with hyperglycemia: Secondary | ICD-10-CM | POA: Diagnosis not present

## 2020-01-30 DIAGNOSIS — K219 Gastro-esophageal reflux disease without esophagitis: Secondary | ICD-10-CM | POA: Diagnosis not present

## 2020-01-30 DIAGNOSIS — J984 Other disorders of lung: Secondary | ICD-10-CM | POA: Diagnosis not present

## 2020-01-31 ENCOUNTER — Telehealth: Payer: Self-pay

## 2020-01-31 NOTE — Telephone Encounter (Signed)
[name],   Patient was scheduled for 20 minutes and she is a 40 minute patient. I have changed her appointment time from 3:20 to 3:00, will you please call and advise her that this has been changed?  Thanks

## 2020-02-01 NOTE — Telephone Encounter (Signed)
Left VM. Will try to contact later today.

## 2020-02-04 ENCOUNTER — Ambulatory Visit: Payer: Medicare Other | Admitting: Physician Assistant

## 2020-02-04 DIAGNOSIS — J069 Acute upper respiratory infection, unspecified: Secondary | ICD-10-CM | POA: Diagnosis not present

## 2020-02-04 DIAGNOSIS — E1165 Type 2 diabetes mellitus with hyperglycemia: Secondary | ICD-10-CM | POA: Diagnosis not present

## 2020-02-04 DIAGNOSIS — Z20828 Contact with and (suspected) exposure to other viral communicable diseases: Secondary | ICD-10-CM | POA: Diagnosis not present

## 2020-02-04 DIAGNOSIS — J984 Other disorders of lung: Secondary | ICD-10-CM | POA: Diagnosis not present

## 2020-02-04 DIAGNOSIS — K7581 Nonalcoholic steatohepatitis (NASH): Secondary | ICD-10-CM | POA: Diagnosis not present

## 2020-02-04 DIAGNOSIS — E1149 Type 2 diabetes mellitus with other diabetic neurological complication: Secondary | ICD-10-CM | POA: Diagnosis not present

## 2020-02-04 DIAGNOSIS — R300 Dysuria: Secondary | ICD-10-CM | POA: Diagnosis not present

## 2020-02-05 DIAGNOSIS — L89202 Pressure ulcer of unspecified hip, stage 2: Secondary | ICD-10-CM | POA: Diagnosis not present

## 2020-02-06 DIAGNOSIS — K7581 Nonalcoholic steatohepatitis (NASH): Secondary | ICD-10-CM | POA: Diagnosis not present

## 2020-02-06 DIAGNOSIS — N1831 Chronic kidney disease, stage 3a: Secondary | ICD-10-CM | POA: Diagnosis not present

## 2020-02-06 DIAGNOSIS — K219 Gastro-esophageal reflux disease without esophagitis: Secondary | ICD-10-CM | POA: Diagnosis not present

## 2020-02-06 DIAGNOSIS — E790 Hyperuricemia without signs of inflammatory arthritis and tophaceous disease: Secondary | ICD-10-CM | POA: Diagnosis not present

## 2020-02-06 DIAGNOSIS — J984 Other disorders of lung: Secondary | ICD-10-CM | POA: Diagnosis not present

## 2020-02-06 DIAGNOSIS — E871 Hypo-osmolality and hyponatremia: Secondary | ICD-10-CM | POA: Diagnosis not present

## 2020-02-06 DIAGNOSIS — E1165 Type 2 diabetes mellitus with hyperglycemia: Secondary | ICD-10-CM | POA: Diagnosis not present

## 2020-02-07 DIAGNOSIS — Z9119 Patient's noncompliance with other medical treatment and regimen: Secondary | ICD-10-CM | POA: Diagnosis not present

## 2020-02-07 DIAGNOSIS — K7581 Nonalcoholic steatohepatitis (NASH): Secondary | ICD-10-CM | POA: Diagnosis not present

## 2020-02-07 DIAGNOSIS — E1165 Type 2 diabetes mellitus with hyperglycemia: Secondary | ICD-10-CM | POA: Diagnosis not present

## 2020-02-07 DIAGNOSIS — R609 Edema, unspecified: Secondary | ICD-10-CM | POA: Diagnosis not present

## 2020-02-07 DIAGNOSIS — E878 Other disorders of electrolyte and fluid balance, not elsewhere classified: Secondary | ICD-10-CM | POA: Diagnosis not present

## 2020-02-07 DIAGNOSIS — R1084 Generalized abdominal pain: Secondary | ICD-10-CM | POA: Diagnosis not present

## 2020-02-07 DIAGNOSIS — R601 Generalized edema: Secondary | ICD-10-CM | POA: Diagnosis not present

## 2020-02-07 DIAGNOSIS — K219 Gastro-esophageal reflux disease without esophagitis: Secondary | ICD-10-CM | POA: Diagnosis not present

## 2020-02-07 DIAGNOSIS — Z8679 Personal history of other diseases of the circulatory system: Secondary | ICD-10-CM | POA: Diagnosis not present

## 2020-02-07 DIAGNOSIS — E669 Obesity, unspecified: Secondary | ICD-10-CM | POA: Diagnosis not present

## 2020-02-07 DIAGNOSIS — K7460 Unspecified cirrhosis of liver: Secondary | ICD-10-CM | POA: Diagnosis not present

## 2020-02-07 DIAGNOSIS — N189 Chronic kidney disease, unspecified: Secondary | ICD-10-CM | POA: Diagnosis not present

## 2020-02-07 DIAGNOSIS — E871 Hypo-osmolality and hyponatremia: Secondary | ICD-10-CM | POA: Diagnosis not present

## 2020-02-07 DIAGNOSIS — I129 Hypertensive chronic kidney disease with stage 1 through stage 4 chronic kidney disease, or unspecified chronic kidney disease: Secondary | ICD-10-CM | POA: Diagnosis not present

## 2020-02-08 DIAGNOSIS — N1831 Chronic kidney disease, stage 3a: Secondary | ICD-10-CM | POA: Diagnosis not present

## 2020-02-08 DIAGNOSIS — E1165 Type 2 diabetes mellitus with hyperglycemia: Secondary | ICD-10-CM | POA: Diagnosis not present

## 2020-02-08 DIAGNOSIS — K219 Gastro-esophageal reflux disease without esophagitis: Secondary | ICD-10-CM | POA: Diagnosis not present

## 2020-02-08 DIAGNOSIS — E871 Hypo-osmolality and hyponatremia: Secondary | ICD-10-CM | POA: Diagnosis not present

## 2020-02-11 DIAGNOSIS — E871 Hypo-osmolality and hyponatremia: Secondary | ICD-10-CM | POA: Diagnosis not present

## 2020-02-11 DIAGNOSIS — E1142 Type 2 diabetes mellitus with diabetic polyneuropathy: Secondary | ICD-10-CM | POA: Diagnosis not present

## 2020-02-11 DIAGNOSIS — E1165 Type 2 diabetes mellitus with hyperglycemia: Secondary | ICD-10-CM | POA: Diagnosis not present

## 2020-02-11 DIAGNOSIS — J984 Other disorders of lung: Secondary | ICD-10-CM | POA: Diagnosis not present

## 2020-02-12 DIAGNOSIS — L89202 Pressure ulcer of unspecified hip, stage 2: Secondary | ICD-10-CM | POA: Diagnosis not present

## 2020-02-12 DIAGNOSIS — B3749 Other urogenital candidiasis: Secondary | ICD-10-CM | POA: Diagnosis not present

## 2020-02-12 DIAGNOSIS — I872 Venous insufficiency (chronic) (peripheral): Secondary | ICD-10-CM | POA: Diagnosis not present

## 2020-02-12 DIAGNOSIS — L98499 Non-pressure chronic ulcer of skin of other sites with unspecified severity: Secondary | ICD-10-CM | POA: Diagnosis not present

## 2020-02-13 DIAGNOSIS — J449 Chronic obstructive pulmonary disease, unspecified: Secondary | ICD-10-CM | POA: Diagnosis present

## 2020-02-13 DIAGNOSIS — L89313 Pressure ulcer of right buttock, stage 3: Secondary | ICD-10-CM | POA: Diagnosis not present

## 2020-02-13 DIAGNOSIS — Z6841 Body Mass Index (BMI) 40.0 and over, adult: Secondary | ICD-10-CM | POA: Diagnosis not present

## 2020-02-13 DIAGNOSIS — B3749 Other urogenital candidiasis: Secondary | ICD-10-CM | POA: Diagnosis not present

## 2020-02-13 DIAGNOSIS — L03116 Cellulitis of left lower limb: Secondary | ICD-10-CM | POA: Diagnosis not present

## 2020-02-13 DIAGNOSIS — L89323 Pressure ulcer of left buttock, stage 3: Secondary | ICD-10-CM | POA: Diagnosis not present

## 2020-02-13 DIAGNOSIS — K7460 Unspecified cirrhosis of liver: Secondary | ICD-10-CM | POA: Diagnosis present

## 2020-02-13 DIAGNOSIS — Z7401 Bed confinement status: Secondary | ICD-10-CM | POA: Diagnosis not present

## 2020-02-13 DIAGNOSIS — K7581 Nonalcoholic steatohepatitis (NASH): Secondary | ICD-10-CM | POA: Diagnosis present

## 2020-02-13 DIAGNOSIS — G4733 Obstructive sleep apnea (adult) (pediatric): Secondary | ICD-10-CM | POA: Diagnosis present

## 2020-02-13 DIAGNOSIS — N189 Chronic kidney disease, unspecified: Secondary | ICD-10-CM | POA: Diagnosis not present

## 2020-02-13 DIAGNOSIS — D631 Anemia in chronic kidney disease: Secondary | ICD-10-CM | POA: Diagnosis present

## 2020-02-13 DIAGNOSIS — Z9119 Patient's noncompliance with other medical treatment and regimen: Secondary | ICD-10-CM | POA: Diagnosis not present

## 2020-02-13 DIAGNOSIS — Z20822 Contact with and (suspected) exposure to covid-19: Secondary | ICD-10-CM | POA: Diagnosis not present

## 2020-02-13 DIAGNOSIS — G8929 Other chronic pain: Secondary | ICD-10-CM | POA: Diagnosis present

## 2020-02-13 DIAGNOSIS — M6281 Muscle weakness (generalized): Secondary | ICD-10-CM | POA: Diagnosis not present

## 2020-02-13 DIAGNOSIS — L0390 Cellulitis, unspecified: Secondary | ICD-10-CM | POA: Diagnosis not present

## 2020-02-13 DIAGNOSIS — N390 Urinary tract infection, site not specified: Secondary | ICD-10-CM | POA: Diagnosis not present

## 2020-02-13 DIAGNOSIS — I878 Other specified disorders of veins: Secondary | ICD-10-CM | POA: Diagnosis present

## 2020-02-13 DIAGNOSIS — Z9181 History of falling: Secondary | ICD-10-CM | POA: Diagnosis not present

## 2020-02-13 DIAGNOSIS — I129 Hypertensive chronic kidney disease with stage 1 through stage 4 chronic kidney disease, or unspecified chronic kidney disease: Secondary | ICD-10-CM | POA: Diagnosis present

## 2020-02-13 DIAGNOSIS — E1165 Type 2 diabetes mellitus with hyperglycemia: Secondary | ICD-10-CM | POA: Diagnosis not present

## 2020-02-13 DIAGNOSIS — N1832 Chronic kidney disease, stage 3b: Secondary | ICD-10-CM | POA: Diagnosis not present

## 2020-02-13 DIAGNOSIS — I890 Lymphedema, not elsewhere classified: Secondary | ICD-10-CM | POA: Diagnosis present

## 2020-02-13 DIAGNOSIS — F411 Generalized anxiety disorder: Secondary | ICD-10-CM | POA: Diagnosis present

## 2020-02-13 DIAGNOSIS — M2550 Pain in unspecified joint: Secondary | ICD-10-CM | POA: Diagnosis not present

## 2020-02-13 DIAGNOSIS — L03115 Cellulitis of right lower limb: Secondary | ICD-10-CM | POA: Diagnosis not present

## 2020-02-13 DIAGNOSIS — I959 Hypotension, unspecified: Secondary | ICD-10-CM | POA: Diagnosis not present

## 2020-02-13 DIAGNOSIS — R1312 Dysphagia, oropharyngeal phase: Secondary | ICD-10-CM | POA: Diagnosis not present

## 2020-02-13 DIAGNOSIS — E1142 Type 2 diabetes mellitus with diabetic polyneuropathy: Secondary | ICD-10-CM | POA: Diagnosis not present

## 2020-02-13 DIAGNOSIS — E668 Other obesity: Secondary | ICD-10-CM | POA: Diagnosis not present

## 2020-02-13 DIAGNOSIS — I872 Venous insufficiency (chronic) (peripheral): Secondary | ICD-10-CM | POA: Diagnosis present

## 2020-02-13 DIAGNOSIS — L89153 Pressure ulcer of sacral region, stage 3: Secondary | ICD-10-CM | POA: Diagnosis not present

## 2020-02-13 DIAGNOSIS — R52 Pain, unspecified: Secondary | ICD-10-CM | POA: Diagnosis not present

## 2020-02-13 DIAGNOSIS — L299 Pruritus, unspecified: Secondary | ICD-10-CM | POA: Diagnosis not present

## 2020-02-13 DIAGNOSIS — E1136 Type 2 diabetes mellitus with diabetic cataract: Secondary | ICD-10-CM | POA: Diagnosis present

## 2020-02-13 DIAGNOSIS — E669 Obesity, unspecified: Secondary | ICD-10-CM | POA: Diagnosis not present

## 2020-02-13 DIAGNOSIS — E871 Hypo-osmolality and hyponatremia: Secondary | ICD-10-CM | POA: Diagnosis not present

## 2020-02-13 DIAGNOSIS — E119 Type 2 diabetes mellitus without complications: Secondary | ICD-10-CM | POA: Diagnosis not present

## 2020-02-13 DIAGNOSIS — K7469 Other cirrhosis of liver: Secondary | ICD-10-CM | POA: Diagnosis not present

## 2020-02-13 DIAGNOSIS — N183 Chronic kidney disease, stage 3 unspecified: Secondary | ICD-10-CM | POA: Diagnosis present

## 2020-02-13 DIAGNOSIS — N2581 Secondary hyperparathyroidism of renal origin: Secondary | ICD-10-CM | POA: Diagnosis not present

## 2020-02-13 DIAGNOSIS — R339 Retention of urine, unspecified: Secondary | ICD-10-CM | POA: Diagnosis present

## 2020-02-13 DIAGNOSIS — R531 Weakness: Secondary | ICD-10-CM | POA: Diagnosis not present

## 2020-02-13 DIAGNOSIS — E1122 Type 2 diabetes mellitus with diabetic chronic kidney disease: Secondary | ICD-10-CM | POA: Diagnosis not present

## 2020-02-13 DIAGNOSIS — K5900 Constipation, unspecified: Secondary | ICD-10-CM | POA: Diagnosis present

## 2020-02-13 DIAGNOSIS — L03119 Cellulitis of unspecified part of limb: Secondary | ICD-10-CM | POA: Diagnosis not present

## 2020-02-19 DIAGNOSIS — M6281 Muscle weakness (generalized): Secondary | ICD-10-CM | POA: Diagnosis not present

## 2020-02-19 DIAGNOSIS — Z7401 Bed confinement status: Secondary | ICD-10-CM | POA: Diagnosis not present

## 2020-02-19 DIAGNOSIS — I8510 Secondary esophageal varices without bleeding: Secondary | ICD-10-CM | POA: Diagnosis not present

## 2020-02-19 DIAGNOSIS — R339 Retention of urine, unspecified: Secondary | ICD-10-CM | POA: Diagnosis not present

## 2020-02-19 DIAGNOSIS — I872 Venous insufficiency (chronic) (peripheral): Secondary | ICD-10-CM | POA: Diagnosis not present

## 2020-02-19 DIAGNOSIS — J984 Other disorders of lung: Secondary | ICD-10-CM | POA: Diagnosis not present

## 2020-02-19 DIAGNOSIS — E1165 Type 2 diabetes mellitus with hyperglycemia: Secondary | ICD-10-CM | POA: Diagnosis not present

## 2020-02-19 DIAGNOSIS — J449 Chronic obstructive pulmonary disease, unspecified: Secondary | ICD-10-CM | POA: Diagnosis not present

## 2020-02-19 DIAGNOSIS — L03115 Cellulitis of right lower limb: Secondary | ICD-10-CM | POA: Diagnosis not present

## 2020-02-19 DIAGNOSIS — Z9181 History of falling: Secondary | ICD-10-CM | POA: Diagnosis not present

## 2020-02-19 DIAGNOSIS — M2550 Pain in unspecified joint: Secondary | ICD-10-CM | POA: Diagnosis not present

## 2020-02-19 DIAGNOSIS — L0390 Cellulitis, unspecified: Secondary | ICD-10-CM | POA: Diagnosis not present

## 2020-02-19 DIAGNOSIS — E1122 Type 2 diabetes mellitus with diabetic chronic kidney disease: Secondary | ICD-10-CM | POA: Diagnosis not present

## 2020-02-19 DIAGNOSIS — E1169 Type 2 diabetes mellitus with other specified complication: Secondary | ICD-10-CM | POA: Diagnosis not present

## 2020-02-19 DIAGNOSIS — K766 Portal hypertension: Secondary | ICD-10-CM | POA: Diagnosis not present

## 2020-02-19 DIAGNOSIS — R1312 Dysphagia, oropharyngeal phase: Secondary | ICD-10-CM | POA: Diagnosis not present

## 2020-02-19 DIAGNOSIS — I959 Hypotension, unspecified: Secondary | ICD-10-CM | POA: Diagnosis not present

## 2020-02-19 DIAGNOSIS — K7581 Nonalcoholic steatohepatitis (NASH): Secondary | ICD-10-CM | POA: Diagnosis not present

## 2020-02-19 DIAGNOSIS — N1832 Chronic kidney disease, stage 3b: Secondary | ICD-10-CM | POA: Diagnosis not present

## 2020-02-19 DIAGNOSIS — K7469 Other cirrhosis of liver: Secondary | ICD-10-CM | POA: Diagnosis not present

## 2020-02-19 DIAGNOSIS — K219 Gastro-esophageal reflux disease without esophagitis: Secondary | ICD-10-CM | POA: Diagnosis not present

## 2020-02-19 DIAGNOSIS — N1831 Chronic kidney disease, stage 3a: Secondary | ICD-10-CM | POA: Diagnosis not present

## 2020-02-19 DIAGNOSIS — R531 Weakness: Secondary | ICD-10-CM | POA: Diagnosis not present

## 2020-02-19 MED ORDER — DIPHENHYDRAMINE HCL 25 MG PO CAPS
25.00 | ORAL_CAPSULE | ORAL | Status: DC
Start: ? — End: 2020-02-19

## 2020-02-19 MED ORDER — ENOXAPARIN SODIUM 40 MG/0.4ML ~~LOC~~ SOLN
40.00 | SUBCUTANEOUS | Status: DC
Start: 2020-02-19 — End: 2020-02-19

## 2020-02-19 MED ORDER — DEXTROSE 10 % IV SOLN
125.00 | INTRAVENOUS | Status: DC
Start: ? — End: 2020-02-19

## 2020-02-19 MED ORDER — GLUCOSE 40 % PO GEL
15.00 | ORAL | Status: DC
Start: ? — End: 2020-02-19

## 2020-02-19 MED ORDER — HYDROCODONE-ACETAMINOPHEN 5-325 MG PO TABS
1.00 | ORAL_TABLET | ORAL | Status: DC
Start: ? — End: 2020-02-19

## 2020-02-19 MED ORDER — FUROSEMIDE 40 MG PO TABS
40.00 | ORAL_TABLET | ORAL | Status: DC
Start: 2020-02-20 — End: 2020-02-19

## 2020-02-19 MED ORDER — BUDESONIDE-FORMOTEROL FUMARATE 160-4.5 MCG/ACT IN AERO
2.00 | INHALATION_SPRAY | RESPIRATORY_TRACT | Status: DC
Start: 2020-02-19 — End: 2020-02-19

## 2020-02-19 MED ORDER — ALPRAZOLAM 0.5 MG PO TABS
0.50 | ORAL_TABLET | ORAL | Status: DC
Start: ? — End: 2020-02-19

## 2020-02-19 MED ORDER — CALCIUM CARBONATE ANTACID 500 MG PO CHEW
1000.00 | CHEWABLE_TABLET | ORAL | Status: DC
Start: 2020-02-19 — End: 2020-02-19

## 2020-02-19 MED ORDER — LOSARTAN POTASSIUM 25 MG PO TABS
25.00 | ORAL_TABLET | ORAL | Status: DC
Start: 2020-02-20 — End: 2020-02-19

## 2020-02-19 MED ORDER — PANTOPRAZOLE SODIUM 40 MG PO TBEC
40.00 | DELAYED_RELEASE_TABLET | ORAL | Status: DC
Start: 2020-02-20 — End: 2020-02-19

## 2020-02-19 MED ORDER — POLYETHYLENE GLYCOL 3350 17 GM/SCOOP PO POWD
17.00 | ORAL | Status: DC
Start: ? — End: 2020-02-19

## 2020-02-19 MED ORDER — SENNOSIDES 8.6 MG PO TABS
8.60 | ORAL_TABLET | ORAL | Status: DC
Start: 2020-02-19 — End: 2020-02-19

## 2020-02-19 MED ORDER — ALBUTEROL SULFATE (2.5 MG/3ML) 0.083% IN NEBU
2.50 | INHALATION_SOLUTION | RESPIRATORY_TRACT | Status: DC
Start: ? — End: 2020-02-19

## 2020-02-19 MED ORDER — HYDROXYZINE HCL 25 MG PO TABS
25.00 | ORAL_TABLET | ORAL | Status: DC
Start: ? — End: 2020-02-19

## 2020-02-19 MED ORDER — DOXYCYCLINE HYCLATE 100 MG PO TABS
100.00 | ORAL_TABLET | ORAL | Status: DC
Start: 2020-02-19 — End: 2020-02-19

## 2020-02-19 MED ORDER — INSULIN GLARGINE 100 UNIT/ML ~~LOC~~ SOLN
5.00 | SUBCUTANEOUS | Status: DC
Start: 2020-02-20 — End: 2020-02-19

## 2020-02-19 MED ORDER — GENERIC EXTERNAL MEDICATION
2.50 | Status: DC
Start: 2020-02-19 — End: 2020-02-19

## 2020-02-19 MED ORDER — SPIRONOLACTONE 25 MG PO TABS
25.00 | ORAL_TABLET | ORAL | Status: DC
Start: 2020-02-20 — End: 2020-02-19

## 2020-02-19 MED ORDER — TRIAMCINOLONE ACETONIDE 0.1 % EX CREA
TOPICAL_CREAM | CUTANEOUS | Status: DC
Start: 2020-02-19 — End: 2020-02-19

## 2020-02-19 MED ORDER — CITALOPRAM HYDROBROMIDE 20 MG PO TABS
20.00 | ORAL_TABLET | ORAL | Status: DC
Start: 2020-02-20 — End: 2020-02-19

## 2020-02-19 MED ORDER — FAMOTIDINE 20 MG PO TABS
20.00 | ORAL_TABLET | ORAL | Status: DC
Start: 2020-02-20 — End: 2020-02-19

## 2020-02-19 MED ORDER — DULOXETINE HCL 60 MG PO CPEP
120.00 | ORAL_CAPSULE | ORAL | Status: DC
Start: 2020-02-19 — End: 2020-02-19

## 2020-02-19 MED ORDER — INSULIN LISPRO 100 UNIT/ML ~~LOC~~ SOLN
2.00 | SUBCUTANEOUS | Status: DC
Start: 2020-02-19 — End: 2020-02-19

## 2020-02-19 MED ORDER — GABAPENTIN 100 MG PO CAPS
100.00 | ORAL_CAPSULE | ORAL | Status: DC
Start: 2020-02-20 — End: 2020-02-19

## 2020-02-20 DIAGNOSIS — I8510 Secondary esophageal varices without bleeding: Secondary | ICD-10-CM | POA: Diagnosis not present

## 2020-02-20 DIAGNOSIS — I872 Venous insufficiency (chronic) (peripheral): Secondary | ICD-10-CM | POA: Diagnosis not present

## 2020-02-20 DIAGNOSIS — J984 Other disorders of lung: Secondary | ICD-10-CM | POA: Diagnosis not present

## 2020-02-20 DIAGNOSIS — K7469 Other cirrhosis of liver: Secondary | ICD-10-CM | POA: Diagnosis not present

## 2020-02-20 DIAGNOSIS — K766 Portal hypertension: Secondary | ICD-10-CM | POA: Diagnosis not present

## 2020-02-20 DIAGNOSIS — E1169 Type 2 diabetes mellitus with other specified complication: Secondary | ICD-10-CM | POA: Diagnosis not present

## 2020-02-22 ENCOUNTER — Ambulatory Visit: Payer: Medicare Other | Admitting: Physician Assistant

## 2020-02-22 DIAGNOSIS — K219 Gastro-esophageal reflux disease without esophagitis: Secondary | ICD-10-CM | POA: Diagnosis not present

## 2020-02-22 DIAGNOSIS — I8510 Secondary esophageal varices without bleeding: Secondary | ICD-10-CM | POA: Diagnosis not present

## 2020-02-22 DIAGNOSIS — Z5329 Procedure and treatment not carried out because of patient's decision for other reasons: Secondary | ICD-10-CM

## 2020-02-22 DIAGNOSIS — K7581 Nonalcoholic steatohepatitis (NASH): Secondary | ICD-10-CM | POA: Diagnosis not present

## 2020-02-22 DIAGNOSIS — E1165 Type 2 diabetes mellitus with hyperglycemia: Secondary | ICD-10-CM | POA: Diagnosis not present

## 2020-02-22 DIAGNOSIS — I872 Venous insufficiency (chronic) (peripheral): Secondary | ICD-10-CM | POA: Diagnosis not present

## 2020-02-22 DIAGNOSIS — J984 Other disorders of lung: Secondary | ICD-10-CM | POA: Diagnosis not present

## 2020-02-22 DIAGNOSIS — N1831 Chronic kidney disease, stage 3a: Secondary | ICD-10-CM | POA: Diagnosis not present

## 2020-02-22 NOTE — Progress Notes (Signed)
No show

## 2020-02-25 DIAGNOSIS — Z23 Encounter for immunization: Secondary | ICD-10-CM | POA: Diagnosis not present

## 2020-02-26 DIAGNOSIS — S81802D Unspecified open wound, left lower leg, subsequent encounter: Secondary | ICD-10-CM | POA: Diagnosis not present

## 2020-02-26 DIAGNOSIS — N1832 Chronic kidney disease, stage 3b: Secondary | ICD-10-CM | POA: Diagnosis not present

## 2020-02-26 DIAGNOSIS — I872 Venous insufficiency (chronic) (peripheral): Secondary | ICD-10-CM | POA: Diagnosis not present

## 2020-02-26 DIAGNOSIS — M153 Secondary multiple arthritis: Secondary | ICD-10-CM | POA: Diagnosis not present

## 2020-02-26 DIAGNOSIS — E1122 Type 2 diabetes mellitus with diabetic chronic kidney disease: Secondary | ICD-10-CM | POA: Diagnosis not present

## 2020-02-26 DIAGNOSIS — F329 Major depressive disorder, single episode, unspecified: Secondary | ICD-10-CM | POA: Diagnosis not present

## 2020-02-26 DIAGNOSIS — J449 Chronic obstructive pulmonary disease, unspecified: Secondary | ICD-10-CM | POA: Diagnosis not present

## 2020-02-26 DIAGNOSIS — Z6841 Body Mass Index (BMI) 40.0 and over, adult: Secondary | ICD-10-CM | POA: Diagnosis not present

## 2020-02-26 DIAGNOSIS — Z794 Long term (current) use of insulin: Secondary | ICD-10-CM | POA: Diagnosis not present

## 2020-02-26 DIAGNOSIS — I129 Hypertensive chronic kidney disease with stage 1 through stage 4 chronic kidney disease, or unspecified chronic kidney disease: Secondary | ICD-10-CM | POA: Diagnosis not present

## 2020-02-26 DIAGNOSIS — G8929 Other chronic pain: Secondary | ICD-10-CM | POA: Diagnosis not present

## 2020-02-26 DIAGNOSIS — K7581 Nonalcoholic steatohepatitis (NASH): Secondary | ICD-10-CM | POA: Diagnosis not present

## 2020-02-26 DIAGNOSIS — K7460 Unspecified cirrhosis of liver: Secondary | ICD-10-CM | POA: Diagnosis not present

## 2020-02-26 DIAGNOSIS — N179 Acute kidney failure, unspecified: Secondary | ICD-10-CM | POA: Diagnosis not present

## 2020-02-26 DIAGNOSIS — D631 Anemia in chronic kidney disease: Secondary | ICD-10-CM | POA: Diagnosis not present

## 2020-02-26 DIAGNOSIS — M25776 Osteophyte, unspecified foot: Secondary | ICD-10-CM | POA: Diagnosis not present

## 2020-02-26 DIAGNOSIS — K219 Gastro-esophageal reflux disease without esophagitis: Secondary | ICD-10-CM | POA: Diagnosis not present

## 2020-02-26 DIAGNOSIS — M79606 Pain in leg, unspecified: Secondary | ICD-10-CM | POA: Diagnosis not present

## 2020-02-26 DIAGNOSIS — S81801D Unspecified open wound, right lower leg, subsequent encounter: Secondary | ICD-10-CM | POA: Diagnosis not present

## 2020-02-26 DIAGNOSIS — Z79899 Other long term (current) drug therapy: Secondary | ICD-10-CM | POA: Diagnosis not present

## 2020-02-26 DIAGNOSIS — E1151 Type 2 diabetes mellitus with diabetic peripheral angiopathy without gangrene: Secondary | ICD-10-CM | POA: Diagnosis not present

## 2020-02-26 DIAGNOSIS — Z9181 History of falling: Secondary | ICD-10-CM | POA: Diagnosis not present

## 2020-02-26 DIAGNOSIS — G4733 Obstructive sleep apnea (adult) (pediatric): Secondary | ICD-10-CM | POA: Diagnosis not present

## 2020-02-26 DIAGNOSIS — D509 Iron deficiency anemia, unspecified: Secondary | ICD-10-CM | POA: Diagnosis not present

## 2020-02-27 DIAGNOSIS — E1122 Type 2 diabetes mellitus with diabetic chronic kidney disease: Secondary | ICD-10-CM | POA: Diagnosis not present

## 2020-02-27 DIAGNOSIS — S81802D Unspecified open wound, left lower leg, subsequent encounter: Secondary | ICD-10-CM | POA: Diagnosis not present

## 2020-02-27 DIAGNOSIS — E1151 Type 2 diabetes mellitus with diabetic peripheral angiopathy without gangrene: Secondary | ICD-10-CM | POA: Diagnosis not present

## 2020-02-27 DIAGNOSIS — I872 Venous insufficiency (chronic) (peripheral): Secondary | ICD-10-CM | POA: Diagnosis not present

## 2020-02-27 DIAGNOSIS — S81801D Unspecified open wound, right lower leg, subsequent encounter: Secondary | ICD-10-CM | POA: Diagnosis not present

## 2020-02-27 DIAGNOSIS — I129 Hypertensive chronic kidney disease with stage 1 through stage 4 chronic kidney disease, or unspecified chronic kidney disease: Secondary | ICD-10-CM | POA: Diagnosis not present

## 2020-02-28 DIAGNOSIS — E1151 Type 2 diabetes mellitus with diabetic peripheral angiopathy without gangrene: Secondary | ICD-10-CM | POA: Diagnosis not present

## 2020-02-28 DIAGNOSIS — E1122 Type 2 diabetes mellitus with diabetic chronic kidney disease: Secondary | ICD-10-CM | POA: Diagnosis not present

## 2020-02-28 DIAGNOSIS — S81801D Unspecified open wound, right lower leg, subsequent encounter: Secondary | ICD-10-CM | POA: Diagnosis not present

## 2020-02-28 DIAGNOSIS — I129 Hypertensive chronic kidney disease with stage 1 through stage 4 chronic kidney disease, or unspecified chronic kidney disease: Secondary | ICD-10-CM | POA: Diagnosis not present

## 2020-02-28 DIAGNOSIS — S81802D Unspecified open wound, left lower leg, subsequent encounter: Secondary | ICD-10-CM | POA: Diagnosis not present

## 2020-02-28 DIAGNOSIS — I872 Venous insufficiency (chronic) (peripheral): Secondary | ICD-10-CM | POA: Diagnosis not present

## 2020-02-29 ENCOUNTER — Telehealth: Payer: Self-pay | Admitting: Neurology

## 2020-02-29 DIAGNOSIS — E1165 Type 2 diabetes mellitus with hyperglycemia: Secondary | ICD-10-CM

## 2020-02-29 MED ORDER — INSULIN PEN NEEDLE 30G X 8 MM MISC: 0 refills | Status: DC

## 2020-02-29 NOTE — Telephone Encounter (Signed)
[name] with [facility] home health called for patient [phone]). She states patient was discharged from SNF and has insulin but no needles for injections. She asked [place] to send to [place]. RX sent. [place].

## 2020-03-06 DIAGNOSIS — E1151 Type 2 diabetes mellitus with diabetic peripheral angiopathy without gangrene: Secondary | ICD-10-CM | POA: Diagnosis not present

## 2020-03-06 DIAGNOSIS — I129 Hypertensive chronic kidney disease with stage 1 through stage 4 chronic kidney disease, or unspecified chronic kidney disease: Secondary | ICD-10-CM | POA: Diagnosis not present

## 2020-03-06 DIAGNOSIS — E1122 Type 2 diabetes mellitus with diabetic chronic kidney disease: Secondary | ICD-10-CM | POA: Diagnosis not present

## 2020-03-06 DIAGNOSIS — I872 Venous insufficiency (chronic) (peripheral): Secondary | ICD-10-CM | POA: Diagnosis not present

## 2020-03-06 DIAGNOSIS — S81802D Unspecified open wound, left lower leg, subsequent encounter: Secondary | ICD-10-CM | POA: Diagnosis not present

## 2020-03-06 DIAGNOSIS — S81801D Unspecified open wound, right lower leg, subsequent encounter: Secondary | ICD-10-CM | POA: Diagnosis not present

## 2020-03-07 ENCOUNTER — Telehealth: Payer: Self-pay | Admitting: Physician Assistant

## 2020-03-07 DIAGNOSIS — L89322 Pressure ulcer of left buttock, stage 2: Secondary | ICD-10-CM | POA: Diagnosis not present

## 2020-03-07 DIAGNOSIS — E46 Unspecified protein-calorie malnutrition: Secondary | ICD-10-CM | POA: Diagnosis not present

## 2020-03-07 DIAGNOSIS — L89312 Pressure ulcer of right buttock, stage 2: Secondary | ICD-10-CM | POA: Diagnosis not present

## 2020-03-07 NOTE — Telephone Encounter (Signed)
Catheter was taken out for a visit at the wound clinic, they were told to get a scrip to [facility] for a catheter and a bag until they get until they go to the urologist next week.

## 2020-03-07 NOTE — Telephone Encounter (Signed)
Is she talking about an in and out catheter? She should not need a bag for that. She is going to cath herself?

## 2020-03-07 NOTE — Telephone Encounter (Signed)
Can we call patient and get more information on what is going on and what she needs?

## 2020-03-07 NOTE — Telephone Encounter (Signed)
Dom states can [employer], her sister, if we can write RX. Please advise.

## 2020-03-07 NOTE — Telephone Encounter (Signed)
Called and left a message for a return call.

## 2020-03-10 ENCOUNTER — Telehealth: Payer: Self-pay | Admitting: Neurology

## 2020-03-10 DIAGNOSIS — E1151 Type 2 diabetes mellitus with diabetic peripheral angiopathy without gangrene: Secondary | ICD-10-CM | POA: Diagnosis not present

## 2020-03-10 DIAGNOSIS — N1831 Chronic kidney disease, stage 3a: Secondary | ICD-10-CM

## 2020-03-10 MED ORDER — AMBULATORY NON FORMULARY MEDICATION: 99 refills | Status: DC

## 2020-03-10 MED ORDER — AMBULATORY NON FORMULARY MEDICATION: 0 refills | Status: DC

## 2020-03-10 NOTE — Telephone Encounter (Signed)
[name] with [name] called and needs order to change foley catheter. Requesting order to be faxed to [phone]. Order written, signed, and faxed as requested with confirmation received.

## 2020-03-10 NOTE — Telephone Encounter (Signed)
Ameka has incontinence and needs Foley catheter kit with a leg bag. They are aware that [facility] will not bill Medicare.

## 2020-03-10 NOTE — Telephone Encounter (Signed)
Order printed and placed to be signed.

## 2020-03-10 NOTE — Telephone Encounter (Signed)
Ok for order?  

## 2020-03-11 DIAGNOSIS — E1122 Type 2 diabetes mellitus with diabetic chronic kidney disease: Secondary | ICD-10-CM | POA: Diagnosis not present

## 2020-03-11 DIAGNOSIS — I129 Hypertensive chronic kidney disease with stage 1 through stage 4 chronic kidney disease, or unspecified chronic kidney disease: Secondary | ICD-10-CM | POA: Diagnosis not present

## 2020-03-11 DIAGNOSIS — I872 Venous insufficiency (chronic) (peripheral): Secondary | ICD-10-CM | POA: Diagnosis not present

## 2020-03-11 DIAGNOSIS — S81801D Unspecified open wound, right lower leg, subsequent encounter: Secondary | ICD-10-CM | POA: Diagnosis not present

## 2020-03-11 DIAGNOSIS — E1151 Type 2 diabetes mellitus with diabetic peripheral angiopathy without gangrene: Secondary | ICD-10-CM | POA: Diagnosis not present

## 2020-03-11 DIAGNOSIS — S81802D Unspecified open wound, left lower leg, subsequent encounter: Secondary | ICD-10-CM | POA: Diagnosis not present

## 2020-03-13 DIAGNOSIS — E1122 Type 2 diabetes mellitus with diabetic chronic kidney disease: Secondary | ICD-10-CM | POA: Diagnosis not present

## 2020-03-13 DIAGNOSIS — I872 Venous insufficiency (chronic) (peripheral): Secondary | ICD-10-CM | POA: Diagnosis not present

## 2020-03-13 DIAGNOSIS — E1151 Type 2 diabetes mellitus with diabetic peripheral angiopathy without gangrene: Secondary | ICD-10-CM | POA: Diagnosis not present

## 2020-03-13 DIAGNOSIS — I129 Hypertensive chronic kidney disease with stage 1 through stage 4 chronic kidney disease, or unspecified chronic kidney disease: Secondary | ICD-10-CM | POA: Diagnosis not present

## 2020-03-13 DIAGNOSIS — S81801D Unspecified open wound, right lower leg, subsequent encounter: Secondary | ICD-10-CM | POA: Diagnosis not present

## 2020-03-13 DIAGNOSIS — S81802D Unspecified open wound, left lower leg, subsequent encounter: Secondary | ICD-10-CM | POA: Diagnosis not present

## 2020-03-17 DIAGNOSIS — R32 Unspecified urinary incontinence: Secondary | ICD-10-CM | POA: Diagnosis not present

## 2020-03-17 DIAGNOSIS — N390 Urinary tract infection, site not specified: Secondary | ICD-10-CM | POA: Diagnosis not present

## 2020-03-18 DIAGNOSIS — E1151 Type 2 diabetes mellitus with diabetic peripheral angiopathy without gangrene: Secondary | ICD-10-CM | POA: Diagnosis not present

## 2020-03-18 DIAGNOSIS — I872 Venous insufficiency (chronic) (peripheral): Secondary | ICD-10-CM | POA: Diagnosis not present

## 2020-03-18 DIAGNOSIS — E1122 Type 2 diabetes mellitus with diabetic chronic kidney disease: Secondary | ICD-10-CM | POA: Diagnosis not present

## 2020-03-18 DIAGNOSIS — S81801D Unspecified open wound, right lower leg, subsequent encounter: Secondary | ICD-10-CM | POA: Diagnosis not present

## 2020-03-18 DIAGNOSIS — I129 Hypertensive chronic kidney disease with stage 1 through stage 4 chronic kidney disease, or unspecified chronic kidney disease: Secondary | ICD-10-CM | POA: Diagnosis not present

## 2020-03-18 DIAGNOSIS — S81802D Unspecified open wound, left lower leg, subsequent encounter: Secondary | ICD-10-CM | POA: Diagnosis not present

## 2020-03-20 DIAGNOSIS — E1151 Type 2 diabetes mellitus with diabetic peripheral angiopathy without gangrene: Secondary | ICD-10-CM | POA: Diagnosis not present

## 2020-03-20 DIAGNOSIS — E1122 Type 2 diabetes mellitus with diabetic chronic kidney disease: Secondary | ICD-10-CM | POA: Diagnosis not present

## 2020-03-20 DIAGNOSIS — S81802D Unspecified open wound, left lower leg, subsequent encounter: Secondary | ICD-10-CM | POA: Diagnosis not present

## 2020-03-20 DIAGNOSIS — I872 Venous insufficiency (chronic) (peripheral): Secondary | ICD-10-CM | POA: Diagnosis not present

## 2020-03-20 DIAGNOSIS — I129 Hypertensive chronic kidney disease with stage 1 through stage 4 chronic kidney disease, or unspecified chronic kidney disease: Secondary | ICD-10-CM | POA: Diagnosis not present

## 2020-03-20 DIAGNOSIS — S81801D Unspecified open wound, right lower leg, subsequent encounter: Secondary | ICD-10-CM | POA: Diagnosis not present

## 2020-03-21 DIAGNOSIS — E1151 Type 2 diabetes mellitus with diabetic peripheral angiopathy without gangrene: Secondary | ICD-10-CM | POA: Diagnosis not present

## 2020-03-21 DIAGNOSIS — I872 Venous insufficiency (chronic) (peripheral): Secondary | ICD-10-CM | POA: Diagnosis not present

## 2020-03-21 DIAGNOSIS — I129 Hypertensive chronic kidney disease with stage 1 through stage 4 chronic kidney disease, or unspecified chronic kidney disease: Secondary | ICD-10-CM | POA: Diagnosis not present

## 2020-03-21 DIAGNOSIS — S81801D Unspecified open wound, right lower leg, subsequent encounter: Secondary | ICD-10-CM | POA: Diagnosis not present

## 2020-03-21 DIAGNOSIS — S81802D Unspecified open wound, left lower leg, subsequent encounter: Secondary | ICD-10-CM | POA: Diagnosis not present

## 2020-03-21 DIAGNOSIS — E1122 Type 2 diabetes mellitus with diabetic chronic kidney disease: Secondary | ICD-10-CM | POA: Diagnosis not present

## 2020-03-22 ENCOUNTER — Other Ambulatory Visit: Payer: Self-pay | Admitting: Physician Assistant

## 2020-03-22 DIAGNOSIS — F331 Major depressive disorder, recurrent, moderate: Secondary | ICD-10-CM

## 2020-03-24 ENCOUNTER — Emergency Department (HOSPITAL_BASED_OUTPATIENT_CLINIC_OR_DEPARTMENT_OTHER)
Admission: EM | Admit: 2020-03-24 | Discharge: 2020-03-24 | Disposition: A | Discharge status: Home / Self Care | Payer: Medicare Other | Attending: Emergency Medicine | Admitting: Emergency Medicine

## 2020-03-24 ENCOUNTER — Other Ambulatory Visit: Payer: Self-pay

## 2020-03-24 ENCOUNTER — Encounter (HOSPITAL_BASED_OUTPATIENT_CLINIC_OR_DEPARTMENT_OTHER): Payer: Self-pay | Admitting: *Deleted

## 2020-03-24 DIAGNOSIS — L02811 Cutaneous abscess of head [any part, except face]: Secondary | ICD-10-CM | POA: Diagnosis not present

## 2020-03-24 DIAGNOSIS — N183 Chronic kidney disease, stage 3 unspecified: Secondary | ICD-10-CM | POA: Insufficient documentation

## 2020-03-24 DIAGNOSIS — Z96651 Presence of right artificial knee joint: Secondary | ICD-10-CM | POA: Diagnosis not present

## 2020-03-24 DIAGNOSIS — L0291 Cutaneous abscess, unspecified: Secondary | ICD-10-CM

## 2020-03-24 DIAGNOSIS — E1122 Type 2 diabetes mellitus with diabetic chronic kidney disease: Secondary | ICD-10-CM | POA: Insufficient documentation

## 2020-03-24 DIAGNOSIS — L03811 Cellulitis of head [any part, except face]: Secondary | ICD-10-CM | POA: Insufficient documentation

## 2020-03-24 DIAGNOSIS — R220 Localized swelling, mass and lump, head: Secondary | ICD-10-CM | POA: Diagnosis present

## 2020-03-24 LAB — CBG MONITORING, ED: Glucose-Capillary: 182 mg/dL — ABNORMAL HIGH (ref 70–99)

## 2020-03-24 MED ORDER — CEPHALEXIN 500 MG PO CAPS: 500.0000 mg | ORAL_CAPSULE | Freq: Four times a day (QID) | ORAL | 0 refills | Status: AC

## 2020-03-24 MED ORDER — CEPHALEXIN 250 MG PO CAPS
500.0000 mg | ORAL_CAPSULE | Freq: Once | ORAL | Status: AC
  Administered 2020-03-24: 500 mg via ORAL
  Filled 2020-03-24: qty 2

## 2020-03-24 MED ORDER — LIDOCAINE HCL (PF) 1 % IJ SOLN
30.0000 mL | Freq: Once | INTRAMUSCULAR | Status: AC
  Administered 2020-03-24: 30 mL
  Filled 2020-03-24: qty 30

## 2020-03-24 NOTE — ED Triage Notes (Addendum)
Pt reports a 'bump' on her head x 2 days. States that she 'punched it with a diabetic needle'. Large abscess to posterior scalp, redness and swelling noted.

## 2020-03-24 NOTE — ED Provider Notes (Signed)
[facility] Emergency Department Provider Note MRN:  [id number]  Arrival date & time: 03/24/20     Chief Complaint   Abscess   History of Present Illness   [name] is a 69 y.o. year-old female with a history of diabetes presenting to the ED with chief complaint of abscess.  2 or 3 days of worsening pain and swelling to the back of her head.  Tried to pop the boil with a diabetes needle, did not drain it all the way.  Denies fever, no other complaints.  Pain is constant, moderate, worse when trying to lay flat on her back.  Review of Systems  A complete 10 system review of systems was obtained and all systems are negative except as noted in the HPI and PMH.   Patient's Health History    Past Medical History:  Diagnosis Date  . Anemia    hx of low iron  . Anxiety   . Arthritis   . Cataracts, bilateral   . Chronic kidney disease    nephrologist  Dr. Gillian [facility], told pt. she is stage III  . Diabetes ([place])   . Family history of coronary artery disease   . Fatty liver   . GERD (gastroesophageal reflux disease)   . Headache   . Morbid obesity ([place])   . Peripheral vascular disease ([place])   . PONV (postoperative nausea and vomiting)   . Pulmonary embolism ([place]) 2001  . Restless leg syndrome   . Shortness of breath dyspnea   . Sleep apnea    can't wear her cpap due to nose being sore  . Venous stasis dermatitis of both lower extremities     Past Surgical History:  Procedure Laterality Date  . ABDOMINAL HYSTERECTOMY    . APPENDECTOMY    . BUNIONECTOMY Right 01/28/2015   Procedure: Lillard Anes;  Surgeon: Hessie Dibble, MD;  Location: [place];  Service: Orthopedics;  Laterality: Right;  . CARDIAC CATHETERIZATION  2014  . CHOLECYSTECTOMY    . COLONOSCOPY    . COLONOSCOPY    . FRACTURE SURGERY Left    shoulder  . HERNIA REPAIR     umbilical hernia  . JOINT REPLACEMENT Right 2001   knee  . PANNICULECTOMY    .  SHOULDER ARTHROSCOPY Right 02/24/2016   Procedure: RIGHT SHOULDER ARTHROSCOPY, ACROMIOPLASTY, DISTAL CLAVICLE RESECTION, DEBRIDEMENT;  Surgeon: Melrose Nakayama, MD;  Location: [place];  Service: Orthopedics;  Laterality: Right;  . TENNIS ELBOW RELEASE/NIRSCHEL PROCEDURE Right 02/24/2016   Procedure: RIGHT ELBOW LATERAL REPAIR;  Surgeon: Melrose Nakayama, MD;  Location: [place];  Service: Orthopedics;  Laterality: Right;    Family History  Problem Relation Age of Onset  . Heart attack Mother   . Diabetes Mother   . Hyperlipidemia Mother   . Hypertension Mother   . Heart attack Father   . Hyperlipidemia Father   . Hypertension Father   . Renal Disease Sister   . Heart failure Sister   . Parkinsonism Brother   . Hypertension Sister   . Thyroid disease Sister   . Crohn's disease Sister   . Cancer Maternal Grandmother   . Cancer Maternal Grandfather   . Stroke Paternal Grandfather     Social History   Socioeconomic History  . Marital status: Widowed    Spouse name: Not on file  . Number of children: Not on file  . Years of education: Not on file  . Highest education level: Not  on file  Occupational History  . Not on file  Tobacco Use  . Smoking status: Never Smoker  . Smokeless tobacco: Never Used  Substance and Sexual Activity  . Alcohol use: No  . Drug use: No  . Sexual activity: Never  Other Topics Concern  . Not on file  Social History Narrative  . Not on file   Social Determinants of Health   Financial Resource Strain:   . Difficulty of Paying Living Expenses:   Food Insecurity:   . Worried About Charity fundraiser in the Last Year:   . Arboriculturist in the Last Year:   Transportation Needs:   . Film/video editor (Medical):   [name] Kitchen Lack of Transportation (Non-Medical):   Physical Activity:   . Days of Exercise per Week:   . Minutes of Exercise per Session:   Stress:   . Feeling of Stress :   Social Connections:   . Frequency of Communication with Friends and  Family:   . Frequency of Social Gatherings with Friends and Family:   . Attends Religious Services:   . Active Member of Clubs or Organizations:   . Attends Archivist Meetings:   [name] Kitchen Marital Status:   Intimate Partner Violence:   . Fear of Current or Ex-Partner:   . Emotionally Abused:   [name] Kitchen Physically Abused:   . Sexually Abused:      Physical Exam   Vitals:   03/24/20 1916 03/24/20 2124  BP: (!) 128/108 115/78  Pulse: 97 (!) 106  Resp: 18 16  Temp: 98.1 F (36.7 C)   SpO2: 100% 98%    CONSTITUTIONAL: Well-appearing, NAD NEURO:  Alert and oriented x 3, no focal deficits EYES:  eyes equal and reactive ENT/NECK:  no LAD, no JVD CARDIO: Regular rate, well-perfused, normal S1 and S2 PULM:  CTAB no wheezing or rhonchi GI/GU:  normal bowel sounds, non-distended, non-tender MSK/SPINE:  No gross deformities, no edema SKIN: Fluctuant abscess to the occipital scalp with surrounding erythema PSYCH:  Appropriate speech and behavior  *Additional and/or pertinent findings included in MDM below  Diagnostic and Interventional Summary    EKG Interpretation  Date/Time:    Ventricular Rate:    PR Interval:    QRS Duration:   QT Interval:    QTC Calculation:   R Axis:     Text Interpretation:        Labs Reviewed  CBG MONITORING, ED - Abnormal; Notable for the following components:      Result Value   Glucose-Capillary 182 (*)    All other components within normal limits    No orders to display    Medications  lidocaine (PF) (XYLOCAINE) 1 % injection 30 mL (has no administration in time range)  cephALEXin (KEFLEX) capsule 500 mg (has no administration in time range)     Procedures  /  Critical Care .[name] KitchenIncision and Drainage  Date/Time: 03/24/2020 9:49 PM Performed by: Maudie Flakes, MD Authorized by: Maudie Flakes, MD   Consent:    Consent obtained:  Verbal   Consent given by:  Patient   Risks discussed:  Bleeding, incomplete drainage, infection and  pain Location:    Type:  Abscess   Size:  5cm   Location: scalp. Pre-procedure details:    Skin preparation:  Chloraprep Anesthesia (see MAR for exact dosages):    Anesthesia method:  Local infiltration   Local anesthetic:  Lidocaine 1% w/o epi Procedure type:  Complexity:  Complex Procedure details:    Incision types:  Cruciate   Incision depth:  Submucosal   Scalpel blade:  15   Wound management:  Probed and deloculated   Drainage:  Bloody and purulent   Drainage amount:  Moderate   Wound treatment:  Wound left open   Packing materials:  None Post-procedure details:    Patient tolerance of procedure:  Tolerated well, no immediate complications    ED Course and Medical Decision Making  I have reviewed the triage vital signs, the nursing notes, and pertinent available records from the EMR.  Pertinent labs & imaging results that were available during my care of the patient were reviewed by me and considered in my medical decision making (see below for details).     Abscess with surrounding cellulitis, drained as described above, no systemic symptoms, blood sugar fairly reassuring here in the emergency department, appropriate for discharge.    Barth Kirks. [place]onia Small, [place] [email or web address]  Final Clinical Impressions(s) / ED Diagnoses     ICD-10-CM   1. Abscess  L02.91     ED Discharge Orders         Ordered    cephALEXin (KEFLEX) 500 MG capsule  4 times daily     03/24/20 2147           Discharge Instructions Discussed with and Provided to Patient:     Discharge Instructions     You were evaluated in the Emergency Department and after careful evaluation, we did not find any emergent condition requiring admission or further testing in the hospital.  Your exam/testing today was overall reassuring.  Please take the antibiotics as directed.  Please return to the Emergency Department if you experience any  worsening of your condition.  We encourage you to follow up with a primary care provider.  Thank you for allowing [place] to be a part of your care.        Maudie Flakes, MD 03/24/20 2151

## 2020-03-24 NOTE — Discharge Instructions (Addendum)
You were evaluated in the Emergency Department and after careful evaluation, we did not find any emergent condition requiring admission or further testing in the hospital.  Your exam/testing today was overall reassuring.  Please take the antibiotics as directed.  Please return to the Emergency Department if you experience any worsening of your condition.  We encourage you to follow up with a primary care provider.  Thank you for allowing [place] to be a part of your care.

## 2020-03-24 NOTE — ED Notes (Signed)
Gauze placed over wound. Coban used to secure site. Patient instructed on dressing change care.

## 2020-03-25 DIAGNOSIS — I129 Hypertensive chronic kidney disease with stage 1 through stage 4 chronic kidney disease, or unspecified chronic kidney disease: Secondary | ICD-10-CM | POA: Diagnosis not present

## 2020-03-25 DIAGNOSIS — E1151 Type 2 diabetes mellitus with diabetic peripheral angiopathy without gangrene: Secondary | ICD-10-CM | POA: Diagnosis not present

## 2020-03-25 DIAGNOSIS — S81802D Unspecified open wound, left lower leg, subsequent encounter: Secondary | ICD-10-CM | POA: Diagnosis not present

## 2020-03-25 DIAGNOSIS — I872 Venous insufficiency (chronic) (peripheral): Secondary | ICD-10-CM | POA: Diagnosis not present

## 2020-03-25 DIAGNOSIS — E1122 Type 2 diabetes mellitus with diabetic chronic kidney disease: Secondary | ICD-10-CM | POA: Diagnosis not present

## 2020-03-25 DIAGNOSIS — S81801D Unspecified open wound, right lower leg, subsequent encounter: Secondary | ICD-10-CM | POA: Diagnosis not present

## 2020-03-27 ENCOUNTER — Telehealth: Payer: Self-pay

## 2020-03-27 DIAGNOSIS — F329 Major depressive disorder, single episode, unspecified: Secondary | ICD-10-CM | POA: Diagnosis not present

## 2020-03-27 DIAGNOSIS — Z6841 Body Mass Index (BMI) 40.0 and over, adult: Secondary | ICD-10-CM | POA: Diagnosis not present

## 2020-03-27 DIAGNOSIS — E1151 Type 2 diabetes mellitus with diabetic peripheral angiopathy without gangrene: Secondary | ICD-10-CM | POA: Diagnosis not present

## 2020-03-27 DIAGNOSIS — D631 Anemia in chronic kidney disease: Secondary | ICD-10-CM | POA: Diagnosis not present

## 2020-03-27 DIAGNOSIS — K7581 Nonalcoholic steatohepatitis (NASH): Secondary | ICD-10-CM | POA: Diagnosis not present

## 2020-03-27 DIAGNOSIS — I872 Venous insufficiency (chronic) (peripheral): Secondary | ICD-10-CM | POA: Diagnosis not present

## 2020-03-27 DIAGNOSIS — N1832 Chronic kidney disease, stage 3b: Secondary | ICD-10-CM | POA: Diagnosis not present

## 2020-03-27 DIAGNOSIS — Z79899 Other long term (current) drug therapy: Secondary | ICD-10-CM | POA: Diagnosis not present

## 2020-03-27 DIAGNOSIS — I129 Hypertensive chronic kidney disease with stage 1 through stage 4 chronic kidney disease, or unspecified chronic kidney disease: Secondary | ICD-10-CM | POA: Diagnosis not present

## 2020-03-27 DIAGNOSIS — M25776 Osteophyte, unspecified foot: Secondary | ICD-10-CM | POA: Diagnosis not present

## 2020-03-27 DIAGNOSIS — S81802D Unspecified open wound, left lower leg, subsequent encounter: Secondary | ICD-10-CM | POA: Diagnosis not present

## 2020-03-27 DIAGNOSIS — J449 Chronic obstructive pulmonary disease, unspecified: Secondary | ICD-10-CM | POA: Diagnosis not present

## 2020-03-27 DIAGNOSIS — M153 Secondary multiple arthritis: Secondary | ICD-10-CM | POA: Diagnosis not present

## 2020-03-27 DIAGNOSIS — D509 Iron deficiency anemia, unspecified: Secondary | ICD-10-CM | POA: Diagnosis not present

## 2020-03-27 DIAGNOSIS — S81801D Unspecified open wound, right lower leg, subsequent encounter: Secondary | ICD-10-CM | POA: Diagnosis not present

## 2020-03-27 DIAGNOSIS — K219 Gastro-esophageal reflux disease without esophagitis: Secondary | ICD-10-CM | POA: Diagnosis not present

## 2020-03-27 DIAGNOSIS — Z794 Long term (current) use of insulin: Secondary | ICD-10-CM | POA: Diagnosis not present

## 2020-03-27 DIAGNOSIS — G4733 Obstructive sleep apnea (adult) (pediatric): Secondary | ICD-10-CM | POA: Diagnosis not present

## 2020-03-27 DIAGNOSIS — K7460 Unspecified cirrhosis of liver: Secondary | ICD-10-CM | POA: Diagnosis not present

## 2020-03-27 DIAGNOSIS — Z9181 History of falling: Secondary | ICD-10-CM | POA: Diagnosis not present

## 2020-03-27 DIAGNOSIS — M79606 Pain in leg, unspecified: Secondary | ICD-10-CM | POA: Diagnosis not present

## 2020-03-27 DIAGNOSIS — N179 Acute kidney failure, unspecified: Secondary | ICD-10-CM | POA: Diagnosis not present

## 2020-03-27 DIAGNOSIS — E1122 Type 2 diabetes mellitus with diabetic chronic kidney disease: Secondary | ICD-10-CM | POA: Diagnosis not present

## 2020-03-27 DIAGNOSIS — G8929 Other chronic pain: Secondary | ICD-10-CM | POA: Diagnosis not present

## 2020-03-27 NOTE — Telephone Encounter (Signed)
Received call from [place] with [facility]. Wanted to report that there was a missed visit this week. Patient has not been participating walk in home health. She declines mobility work such as walking to bathroom, getting in shower, shower safety, etc.   Verbal order given for changing OT for every other week, starting next week, so that she does not miss a week.    FYI to PCP

## 2020-04-01 ENCOUNTER — Telehealth: Payer: Self-pay | Admitting: Physician Assistant

## 2020-04-01 DIAGNOSIS — F419 Anxiety disorder, unspecified: Secondary | ICD-10-CM

## 2020-04-01 MED ORDER — ALPRAZOLAM 1 MG PO TABS: ORAL_TABLET | ORAL | 0 refills | Status: DC

## 2020-04-01 NOTE — Telephone Encounter (Signed)
Patient states that she needs ALPRAZolam Duanne Moron) 1 MG tablet [id number] called in. States that she would like something today and she will come into her appointment on Friday.

## 2020-04-01 NOTE — Telephone Encounter (Signed)
Sent to [facility]

## 2020-04-02 ENCOUNTER — Telehealth: Payer: Self-pay | Admitting: Physician Assistant

## 2020-04-02 DIAGNOSIS — Z0289 Encounter for other administrative examinations: Secondary | ICD-10-CM

## 2020-04-02 DIAGNOSIS — M25562 Pain in left knee: Secondary | ICD-10-CM

## 2020-04-02 DIAGNOSIS — M25511 Pain in right shoulder: Secondary | ICD-10-CM

## 2020-04-02 DIAGNOSIS — G8929 Other chronic pain: Secondary | ICD-10-CM

## 2020-04-02 MED ORDER — HYDROCODONE-ACETAMINOPHEN 5-325 MG PO TABS: 1.0000 | ORAL_TABLET | Freq: Three times a day (TID) | ORAL | 0 refills | Status: DC | PRN

## 2020-04-02 NOTE — Telephone Encounter (Signed)
Spoke with patient and made her aware. Appt changed to virtual for Friday. She states she is completely out of pain medication. It was being given by the nursing home[facility], while she was there. Asking for this to be sent today.

## 2020-04-02 NOTE — Telephone Encounter (Signed)
Last pain contract 05/31/2018. Last prescribed in December.

## 2020-04-02 NOTE — Telephone Encounter (Signed)
PT requested "Pain medication" to be sent in.  She just let me know someone in her household has tested positive of Covid-19. She wants to know if this appointment needs to be virtual.

## 2020-04-02 NOTE — Telephone Encounter (Signed)
Sent. I checked pmp and no rx for any controlled substances. At some point need to see what name she is under at pharmacy so I can see rx filled.

## 2020-04-02 NOTE — Telephone Encounter (Signed)
Yes virtual appt. We could send her pain contract to sign and send back. Appt Friday right? Can she wait to get refill then?

## 2020-04-02 NOTE — Addendum Note (Signed)
Addended by: Donella Stade on: 04/02/2020 01:54 PM   Modules accepted: Orders

## 2020-04-04 ENCOUNTER — Telehealth (INDEPENDENT_AMBULATORY_CARE_PROVIDER_SITE_OTHER): Payer: Medicare Other | Admitting: Physician Assistant

## 2020-04-04 VITALS — BP 121/62 | HR 98 | Ht 59.5 in | Wt 272.0 lb

## 2020-04-04 DIAGNOSIS — R600 Localized edema: Secondary | ICD-10-CM

## 2020-04-04 DIAGNOSIS — F331 Major depressive disorder, recurrent, moderate: Secondary | ICD-10-CM

## 2020-04-04 DIAGNOSIS — D631 Anemia in chronic kidney disease: Secondary | ICD-10-CM

## 2020-04-04 DIAGNOSIS — E0842 Diabetes mellitus due to underlying condition with diabetic polyneuropathy: Secondary | ICD-10-CM | POA: Diagnosis not present

## 2020-04-04 DIAGNOSIS — I10 Essential (primary) hypertension: Secondary | ICD-10-CM | POA: Diagnosis not present

## 2020-04-04 DIAGNOSIS — I890 Lymphedema, not elsewhere classified: Secondary | ICD-10-CM | POA: Diagnosis not present

## 2020-04-04 DIAGNOSIS — N1831 Chronic kidney disease, stage 3a: Secondary | ICD-10-CM | POA: Diagnosis not present

## 2020-04-04 DIAGNOSIS — I872 Venous insufficiency (chronic) (peripheral): Secondary | ICD-10-CM | POA: Diagnosis not present

## 2020-04-04 MED ORDER — SPIRONOLACTONE 25 MG PO TABS: ORAL_TABLET | ORAL | 1 refills | Status: DC

## 2020-04-04 MED ORDER — ALPRAZOLAM 1 MG PO TABS: 1.0000 mg | ORAL_TABLET | Freq: Every day | ORAL | 5 refills | Status: DC

## 2020-04-04 MED ORDER — FUROSEMIDE 40 MG PO TABS: 40.0000 mg | ORAL_TABLET | Freq: Every day | ORAL | 1 refills | Status: DC

## 2020-04-04 MED ORDER — CITALOPRAM HYDROBROMIDE 40 MG PO TABS: 40.0000 mg | ORAL_TABLET | Freq: Every day | ORAL | 1 refills | Status: DC

## 2020-04-04 MED ORDER — LOSARTAN POTASSIUM 25 MG PO TABS: 25.0000 mg | ORAL_TABLET | Freq: Every day | ORAL | 1 refills | Status: DC

## 2020-04-04 MED ORDER — FUROSEMIDE 40 MG PO TABS: 40.0000 mg | ORAL_TABLET | Freq: Every day | ORAL | 1 refills | Status: AC

## 2020-04-04 NOTE — Progress Notes (Signed)
Medication follow up Wants to increase Xanax

## 2020-04-04 NOTE — Progress Notes (Signed)
Patient ID: [name], female   DOB: Apr 21, 1951, 69 y.o.   MRN: [id number] .[name] KitchenVirtual Visit via Telephone Note  I connected with [name] on 04/04/2020 at  2:00 PM EDT by telephone and verified that I am speaking with the correct person using two identifiers.  Location: Patient: home Provider: clinic   I discussed the limitations, risks, security and privacy concerns of performing an evaluation and management service by telephone and the availability of in person appointments. I also discussed with the patient that there may be a patient responsible charge related to this service. The patient expressed understanding and agreed to proceed.   History of Present Illness: Patient is a 69 year old morbidly obese female with type 2 diabetes, hypertension, atherosclerosis, lymphedema, liver cirrhosis, MDD, anxiety who calls into the clinic to get medications refilled.  She has been in and out of the hospital for the last 4 months.  [facility] on March 5 because she was not able to take care of her self.  She is having a lot of problems with lymphedema and cellulitis in her legs.  February 02/07/2020 her kidney function was worsening as well and has some acute kidney injury.  She has been on multiple rounds of antibiotics.  Her last round of antibiotics was cephalexin for an abscess on 03/24/2020.  Her anxiety and depression are out of control.  Per patient in the hospital she was given Xanax nightly and it really significantly helped her mood and anxiety.  When they gave her BuSpar she was having lots of confusion. She is requesting Xanax to be able to take once a day.  She knows the risk of taking with opioids.  She is only taking the opioid as needed for severe pain.home health is coming out to house to wrap and treat legs.   .. Active Ambulatory Problems    Diagnosis Date Noted  . History of pulmonary embolism 03/20/2014  . Type  II diabetes mellitus, well controlled ([place]) 03/20/2014  . Morbidly obese ([place]) 03/20/2014  . Family history of heart disease 03/20/2014  . Bunion, right foot 03/20/2014  . Frequent headaches 07/20/2016  . Macular degeneration, dry 07/20/2016  . No energy 07/21/2016  . OSA on CPAP 07/21/2016  . Closed fracture of nasal bone 07/21/2016  . Hyperlipidemia 07/21/2016  . Depression 07/21/2016  . Gastroesophageal reflux disease without esophagitis 07/21/2016  . Facial pain 07/21/2016  . Chronic bronchitis ([place]) 07/21/2016  . Bilateral edema of lower extremity 07/21/2016  . B12 deficiency 07/21/2016  . Iron deficiency anemia 07/21/2016  . Anemia due to stage 3 chronic kidney disease ([place]) 08/04/2016  . Insomnia 08/04/2016  . Hypertriglyceridemia 08/06/2016  . Diabetic polyneuropathy associated with diabetes mellitus due to underlying condition ([place]) 11/07/2016  . Vitamin D deficiency 11/07/2016  . Chronic right shoulder pain 02/06/2017  . RLS (restless legs syndrome) 02/06/2017  . Essential hypertension 02/06/2017  . Left hand pain 02/06/2017  . Post-menopausal 02/06/2017  . Anxiety 02/06/2017  . Osteopenia 03/01/2017  . Aortic atherosclerosis ([place]) 03/16/2017  . Liver nodule 03/25/2017  . Chronic venous stasis dermatitis of both lower extremities 03/25/2017  . Chronic pain of both knees 05/12/2017  . Pernicious anemia 08/29/2017  . Primary osteoarthritis involving multiple joints 11/25/2017  . Bilateral lower leg cellulitis 11/25/2017  . Abdominal pain, RLQ 11/25/2017  . Abdominal distension (gaseous) 11/25/2017  . Liver cirrhosis secondary to NASH ([place]) 03/21/2017  . Elevated fasting glucose  02/27/2018  . Adenomatous colon polyp 02/28/2018  . Seborrheic dermatitis 06/04/2018  . Hammer toes of both feet 06/05/2018  . Left breast mass 06/05/2018  . Cellulitis of right leg 06/05/2018  . Elevated serum creatinine 07/02/2018  . Hypomagnesemia 09/15/2018  . Hyponatremia 09/15/2018   . Incontinence in female 03/25/2019  . Metatarsalgia of both feet 05/03/2019  . Ganglion cyst of dorsum of right wrist 05/03/2019  . Lymphedema 07/16/2019  . Chronic gastric ulcer without hemorrhage and without perforation 08/29/2019  . Gastritis 08/29/2019   Resolved Ambulatory Problems    Diagnosis Date Noted  . Sweating profusely 08/29/2017  . Second degree burn of abdomen 02/27/2018  . Skin lesion 06/05/2018   Past Medical History:  Diagnosis Date  . Anemia   . Arthritis   . Cataracts, bilateral   . Chronic kidney disease   . Diabetes ([place])   . Family history of coronary artery disease   . Fatty liver   . GERD (gastroesophageal reflux disease)   . Headache   . Morbid obesity ([place])   . Peripheral vascular disease ([place])   . PONV (postoperative nausea and vomiting)   . Pulmonary embolism ([place]) 2001  . Restless leg syndrome   . Shortness of breath dyspnea   . Sleep apnea   . Venous stasis dermatitis of both lower extremities    Reviewed med, allergies, problem list.      Observations/Objective: No acute distress. No labored breathing or cough.  Telephone only and not able to see legs.   .. Today's Vitals   04/04/20 1337  BP: 121/62  Pulse: 98  Weight: 272 lb (123.4 kg)  Height: 4' 11.5" (1.511 m)   Body mass index is 54.02 kg/m.    Assessment and Plan: [name] KitchenMarland KitchenDiagnoses and all orders for this visit:  Lymphedema -     COMPLETE METABOLIC PANEL WITH GFR -     CBC with Differential/Platelet  Moderate episode of recurrent major depressive disorder (HCC) -     Discontinue: citalopram (CELEXA) 40 MG tablet; Take 1 tablet (40 mg total) by mouth daily. -     citalopram (CELEXA) 40 MG tablet; Take 1 tablet (40 mg total) by mouth daily.  Bilateral edema of lower extremity -     Discontinue: furosemide (LASIX) 40 MG tablet; Take 1 tablet (40 mg total) by mouth daily. -     COMPLETE METABOLIC PANEL WITH GFR -     furosemide (LASIX) 40 MG tablet; Take 1 tablet (40  mg total) by mouth daily.  Essential hypertension -     Discontinue: losartan (COZAAR) 25 MG tablet; Take 1 tablet (25 mg total) by mouth daily. -     Discontinue: spironolactone (ALDACTONE) 25 MG tablet; TAKE 1 TABLET DAILY -     COMPLETE METABOLIC PANEL WITH GFR -     CBC with Differential/Platelet -     losartan (COZAAR) 25 MG tablet; Take 1 tablet (25 mg total) by mouth daily. -     spironolactone (ALDACTONE) 25 MG tablet; TAKE 1 TABLET DAILY  Diabetic polyneuropathy associated with diabetes mellitus due to underlying condition (HCC) -     Hemoglobin A1c -     COMPLETE METABOLIC PANEL WITH GFR  Chronic venous stasis dermatitis of both lower extremities -     COMPLETE METABOLIC PANEL WITH GFR -     CBC with Differential/Platelet  Anemia due to stage 3a chronic kidney disease -     COMPLETE METABOLIC PANEL WITH GFR -  CBC with Differential/Platelet  Other orders -     Discontinue: ALPRAZolam (XANAX) 1 MG tablet; Take 1 tablet (1 mg total) by mouth at bedtime. -     ALPRAZolam (XANAX) 1 MG tablet; Take 1 tablet (1 mg total) by mouth at bedtime.   [name] KitchenMarland KitchenPDMP reviewed during this encounter. Nothing came up in PMP review. Need to address this and find out name that rx are under.   Needs labs to follow up on kidney function/electrolytes since they were off at one point.   Refilled medications.   Continue nursing for bilateral leg lymphedema/cellulitis. Continue lasix and spirolactone.   Use norco as needed no more than twice a day and not within 4 hours of xanax. Pt agrees and aware of risk of sudden death when taken together. Pt was on pain contract and out of date due to covid and not being able to come into the office. Will update in next 3 months.   Follow up in 3 months.   Follow Up Instructions:    I discussed the assessment and treatment plan with the patient. The patient was provided an opportunity to ask questions and all were answered. The patient agreed with the plan  and demonstrated an understanding of the instructions.   The patient was advised to call back or seek an in-person evaluation if the symptoms worsen or if the condition fails to improve as anticipated.  I provided 20 minutes of non-face-to-face time during this encounter.   Iran Planas, PA-C

## 2020-04-08 ENCOUNTER — Telehealth: Payer: Self-pay | Admitting: Physician Assistant

## 2020-04-08 ENCOUNTER — Encounter: Payer: Self-pay | Admitting: Physician Assistant

## 2020-04-08 NOTE — Telephone Encounter (Signed)
They have her as Karma Lew at the pharmacy.

## 2020-04-08 NOTE — Telephone Encounter (Signed)
[name] can we look at why patients PMP comes up with no controlled substances in it? I am thinking her name at pharmacy is different than the name we have.

## 2020-04-08 NOTE — Telephone Encounter (Signed)
Is there a way to change her name in [facility] system?

## 2020-04-09 NOTE — Telephone Encounter (Signed)
I don't know the answer to this, Abigail Butts do you know?

## 2020-04-15 ENCOUNTER — Other Ambulatory Visit: Payer: Self-pay | Admitting: Physician Assistant

## 2020-04-15 NOTE — Telephone Encounter (Signed)
Pt called. She needs refill on her Trulicity. Please sent script to [facility] on [address].  Thanks

## 2020-04-16 NOTE — Telephone Encounter (Signed)
Not on med list or under historical meds.

## 2020-04-16 NOTE — Telephone Encounter (Signed)
Yes I do not see that I have her trulicity. It could be possible that someone started her on it in the hospital?

## 2020-04-16 NOTE — Telephone Encounter (Signed)
[name] states she does need a refill on Trulicity 1.5 mg once weekly. She was given this prescription while in rehab. Pended prescription.

## 2020-04-16 NOTE — Telephone Encounter (Signed)
I don't see this on her med list... can you double check me?

## 2020-04-16 NOTE — Addendum Note (Signed)
Addended by: Narda Rutherford on: 04/16/2020 04:17 PM   Modules accepted: Orders

## 2020-04-16 NOTE — Telephone Encounter (Signed)
I called and left a message for a return call to confirm what medication she needs a refill.

## 2020-04-17 ENCOUNTER — Other Ambulatory Visit: Payer: Self-pay

## 2020-04-17 MED ORDER — TRULICITY 1.5 MG/0.5ML ~~LOC~~ SOAJ: 1.5000 mg | SUBCUTANEOUS | 3 refills | Status: DC

## 2020-04-17 NOTE — Telephone Encounter (Signed)
Patient advised, [name] sent prescription to the pharmacy.

## 2020-04-18 ENCOUNTER — Telehealth: Payer: Self-pay | Admitting: Neurology

## 2020-04-18 DIAGNOSIS — N3945 Continuous leakage: Secondary | ICD-10-CM

## 2020-04-18 NOTE — Telephone Encounter (Signed)
[name] with [name] [phone]) called and left VM stating they have been trying to reach patient since Tuesday to schedule foley catheter removal. She had refused visits for two weeks due to a family contact having Covid and is overdue to have this taken out. They can not get in touch with patient and are discharging her. They state she will need to come to the office to have this done. Is this something we do?

## 2020-04-21 NOTE — Telephone Encounter (Signed)
Called and left a message for a return call.

## 2020-04-23 DIAGNOSIS — E1151 Type 2 diabetes mellitus with diabetic peripheral angiopathy without gangrene: Secondary | ICD-10-CM | POA: Diagnosis not present

## 2020-04-23 DIAGNOSIS — I129 Hypertensive chronic kidney disease with stage 1 through stage 4 chronic kidney disease, or unspecified chronic kidney disease: Secondary | ICD-10-CM | POA: Diagnosis not present

## 2020-04-23 DIAGNOSIS — S81802D Unspecified open wound, left lower leg, subsequent encounter: Secondary | ICD-10-CM | POA: Diagnosis not present

## 2020-04-23 DIAGNOSIS — E1122 Type 2 diabetes mellitus with diabetic chronic kidney disease: Secondary | ICD-10-CM | POA: Diagnosis not present

## 2020-04-23 DIAGNOSIS — I872 Venous insufficiency (chronic) (peripheral): Secondary | ICD-10-CM | POA: Diagnosis not present

## 2020-04-23 DIAGNOSIS — S81801D Unspecified open wound, right lower leg, subsequent encounter: Secondary | ICD-10-CM | POA: Diagnosis not present

## 2020-04-23 NOTE — Telephone Encounter (Addendum)
[name] needs to have the catheter replaced. Home Health has discharged her. She is wanting another replaced. I am not sure how to proceed. She states she cannot walk even a foot without urinating. She has tried undergarments with pads and she still urinates down her leg. She needs a catheter so she doesn't urinate down her leg.

## 2020-04-24 NOTE — Addendum Note (Signed)
Addended by: Kerrington Greenhalgh, Clarise Cruz E on: 04/24/2020 06:25 PM   Modules accepted: Orders

## 2020-04-24 NOTE — Telephone Encounter (Signed)
Patient advised. She would like a referral to urology for a catheter placement.

## 2020-04-24 NOTE — Telephone Encounter (Signed)
Referral placed to Dr. Landry Mellow, whom the patient is established, for concerns about incontinence.

## 2020-04-24 NOTE — Telephone Encounter (Signed)
I don't know that this is something we do in the office. If she is having such significant symptoms, she likely needs to be evaluated by urology to see if a chronic catheter is appropriate or if there are other options for her. I suggest she call to see if she can speak with her Urologist to see if they would like to see her in the office.

## 2020-04-25 NOTE — Telephone Encounter (Signed)
Patient advised.

## 2020-04-28 ENCOUNTER — Telehealth: Payer: Self-pay | Admitting: Physician Assistant

## 2020-04-28 ENCOUNTER — Other Ambulatory Visit: Payer: Self-pay

## 2020-04-28 DIAGNOSIS — E1165 Type 2 diabetes mellitus with hyperglycemia: Secondary | ICD-10-CM

## 2020-04-28 MED ORDER — INSULIN PEN NEEDLE 30G X 8 MM MISC: 99 refills | Status: DC

## 2020-04-28 NOTE — Telephone Encounter (Signed)
Please give the PT a call back about medication.

## 2020-04-28 NOTE — Telephone Encounter (Signed)
[name] needed pen needles. Prescription sent.

## 2020-05-12 NOTE — Telephone Encounter (Signed)
I do not know how you would change the patient's name in the system.

## 2020-05-14 ENCOUNTER — Other Ambulatory Visit: Payer: Self-pay | Admitting: Physician Assistant

## 2020-05-14 DIAGNOSIS — Z0289 Encounter for other administrative examinations: Secondary | ICD-10-CM

## 2020-05-14 DIAGNOSIS — G8929 Other chronic pain: Secondary | ICD-10-CM

## 2020-05-14 NOTE — Telephone Encounter (Addendum)
Pt called. She states she is in the donought hole and wants to see about getting her Lantus cheaper.  She also wants to be referred to an Endocrinologist to keep an eye on her sugar. Pt also wants refill on her Zanax and Hydrocodone. Thanks.

## 2020-05-15 NOTE — Telephone Encounter (Signed)
Tanna Furry advised Mardene Celeste to tell patient to look for coupons online for Lantus. RX for medications pended. Please advise on Xanax and Hydrocodone.

## 2020-05-16 DIAGNOSIS — Z466 Encounter for fitting and adjustment of urinary device: Secondary | ICD-10-CM | POA: Diagnosis not present

## 2020-05-16 DIAGNOSIS — N952 Postmenopausal atrophic vaginitis: Secondary | ICD-10-CM | POA: Diagnosis not present

## 2020-05-16 DIAGNOSIS — N390 Urinary tract infection, site not specified: Secondary | ICD-10-CM | POA: Diagnosis not present

## 2020-05-16 DIAGNOSIS — R32 Unspecified urinary incontinence: Secondary | ICD-10-CM | POA: Diagnosis not present

## 2020-05-16 MED ORDER — ALPRAZOLAM 1 MG PO TABS: 1.0000 mg | ORAL_TABLET | Freq: Every day | ORAL | 5 refills | Status: DC

## 2020-05-16 NOTE — Telephone Encounter (Signed)
She has to come in and sign a pain contract and opioid risk score and do a UDS.

## 2020-05-16 NOTE — Telephone Encounter (Signed)
Please schedule appt for patient for refills.

## 2020-05-16 NOTE — Telephone Encounter (Signed)
Left voicemail for patient to call us back to set up appointment.

## 2020-05-19 ENCOUNTER — Telehealth: Payer: Self-pay | Admitting: Physician Assistant

## 2020-05-19 DIAGNOSIS — M25511 Pain in right shoulder: Secondary | ICD-10-CM

## 2020-05-19 DIAGNOSIS — Z0289 Encounter for other administrative examinations: Secondary | ICD-10-CM

## 2020-05-19 DIAGNOSIS — G8929 Other chronic pain: Secondary | ICD-10-CM

## 2020-05-19 DIAGNOSIS — M25561 Pain in right knee: Secondary | ICD-10-CM

## 2020-05-19 MED ORDER — HYDROCODONE-ACETAMINOPHEN 5-325 MG PO TABS: 1.0000 | ORAL_TABLET | Freq: Three times a day (TID) | ORAL | 0 refills | Status: DC | PRN

## 2020-05-19 NOTE — Telephone Encounter (Signed)
I gave her 16 tablets to make it to her June 1st in person visit for drug screen and contract up date.

## 2020-05-19 NOTE — Telephone Encounter (Signed)
Patient called and left a vm about pain medication again. Left VM stating she needs follow up appointment prior to refills. Advised she needs to call back and schedule.

## 2020-05-19 NOTE — Telephone Encounter (Signed)
Patient called in stating she fell 7 times and she better get her medications called in TODAY or else. She states she was going to call the big man at [facility] and she was going to look for another provider. She said that she wanted to speak to angela but I let patient know an appointment needed to be made.

## 2020-05-19 NOTE — Telephone Encounter (Signed)
So this is the first I have heard of needing any medications. So can we call her and figure out where all this is coming from? And what medications better be called in today?

## 2020-05-19 NOTE — Telephone Encounter (Signed)
From last week, it is under RX request so not visible without taking off filters.:   Me     11:32 AM Note   Patient called and left a vm about pain medication again. Left VM stating she needs follow up appointment prior to refills. Advised she needs to call back and schedule.     May 16, 2020     5:53 PM Hali Marry, MD routed this conversation to Narda Rutherford, CMA      4:35 PM Narda Rutherford, CMA routed this conversation to Hali Marry, MD      2:18 PM Leontine Locket, [place] routed this conversation to [place] to Me . [facility]   JZ  1:51 PM Note   Left voicemail for patient to call us back to set up appointment.     Me to Ernst Breach     11:23 AM Note   Please schedule appt for patient for refills.         11:21 AM Hali Marry, MD routed this conversation to Me . [facility]  Hali Marry, MD     11:21 AM Note   She has to come in and sign a pain contract and opioid risk score and do a UDS.     May 15, 2020     1:26 PM You routed this conversation to [place], Royetta Car, [place]  Me     1:26 PM Note   Tanna Furry advised Mardene Celeste to tell patient to look for coupons online for Lantus. RX for medications pended. Please advise on Xanax and Hydrocodone.     May 14, 2020   PM  4:16 PM Lubertha Basque routed this conversation to Me . Towana Badger, CMA  Shea Evans E   PM  4:16 PM Note   Pt called. She states she is in the donought hole and wants to see about getting her Lantus cheaper.  She also wants to be referred to an Endocrinologist to keep an eye on her sugar. Pt also wants refill on her Zanax and Hydrocodone. Thanks.

## 2020-05-19 NOTE — Telephone Encounter (Signed)
LMOM letting patient know RX sent for 16 tablets and will need to keep in person visit for further refills.

## 2020-05-27 ENCOUNTER — Ambulatory Visit (INDEPENDENT_AMBULATORY_CARE_PROVIDER_SITE_OTHER): Payer: Medicare Other | Admitting: Physician Assistant

## 2020-05-27 ENCOUNTER — Encounter: Payer: Self-pay | Admitting: Physician Assistant

## 2020-05-27 VITALS — BP 131/60 | HR 87 | Ht 59.5 in | Wt 264.0 lb

## 2020-05-27 DIAGNOSIS — I10 Essential (primary) hypertension: Secondary | ICD-10-CM

## 2020-05-27 DIAGNOSIS — M159 Polyosteoarthritis, unspecified: Secondary | ICD-10-CM

## 2020-05-27 DIAGNOSIS — E1165 Type 2 diabetes mellitus with hyperglycemia: Secondary | ICD-10-CM | POA: Diagnosis not present

## 2020-05-27 DIAGNOSIS — Z23 Encounter for immunization: Secondary | ICD-10-CM

## 2020-05-27 DIAGNOSIS — M8949 Other hypertrophic osteoarthropathy, multiple sites: Secondary | ICD-10-CM | POA: Diagnosis not present

## 2020-05-27 DIAGNOSIS — I872 Venous insufficiency (chronic) (peripheral): Secondary | ICD-10-CM | POA: Diagnosis not present

## 2020-05-27 DIAGNOSIS — E782 Mixed hyperlipidemia: Secondary | ICD-10-CM | POA: Diagnosis not present

## 2020-05-27 LAB — POCT GLYCOSYLATED HEMOGLOBIN (HGB A1C): Hemoglobin A1C: 7.4 % — AB (ref 4.0–5.6)

## 2020-05-27 NOTE — Progress Notes (Signed)
Subjective:    Patient ID: [name], female    DOB: [birth year], 69 y.o.   MRN: [id number]  HPI  Patient is a 69 year old morbidly obese female with type 2 diabetes, hypertension, hyperlipidemia, urge incontinence, lymphedema, history of bilateral leg cellulitis, primary osteoarthritis of multiple joints who presents to the clinic for follow-up and medication refill.  She was recently in the hospital for bilateral cellulitis that got in her bloodstream.  She was so weak after the hospital they had to put her in a nursing facility.  She was stopped on Metformin and glipizide due to her kidney function worsening.  She was also placed on insulin because of her elevated sugars.  Her NovoLog is too expensive and she will need something else to replace this.  She is home from nursing home but certainly weak and struggling.  She was having some problems with urge incontinence and leaking and was seen by urology.  They catheterize her and she is following up monthly with them.  The catheterization is getting rid of the leaks that were causing some skin breakdown.  Patient continues to hurt in her knees and feet a lot.  This inhibits her from walking.  She has been taking a little more of her hydrocodone.  Some days she does take 3 tablets.  Dr. Demetrius Revel is managing her orthopedic needs.  She is not a candidate for surgery.  She did have a recent fall going down the steps and landed on her right knee.     .. Active Ambulatory Problems    Diagnosis Date Noted  . History of pulmonary embolism 03/20/2014  . Type 2 diabetes mellitus with hyperglycemia, without long-term current use of insulin ([place]) 03/20/2014  . Morbidly obese ([place]) 03/20/2014  . Family history of heart disease 03/20/2014  . Bunion, right foot 03/20/2014  . Frequent headaches 07/20/2016  . Macular degeneration, dry 07/20/2016  . No energy 07/21/2016  . OSA on CPAP 07/21/2016  . Closed fracture of nasal bone 07/21/2016  .  Hyperlipidemia 07/21/2016  . Depression 07/21/2016  . Gastroesophageal reflux disease without esophagitis 07/21/2016  . Facial pain 07/21/2016  . Chronic bronchitis ([place]) 07/21/2016  . Bilateral edema of lower extremity 07/21/2016  . B12 deficiency 07/21/2016  . Iron deficiency anemia 07/21/2016  . Anemia due to stage 3 chronic kidney disease ([place]) 08/04/2016  . Insomnia 08/04/2016  . Hypertriglyceridemia 08/06/2016  . Diabetic polyneuropathy associated with diabetes mellitus due to underlying condition ([place]) 11/07/2016  . Vitamin D deficiency 11/07/2016  . Chronic right shoulder pain 02/06/2017  . RLS (restless legs syndrome) 02/06/2017  . Essential hypertension 02/06/2017  . Left hand pain 02/06/2017  . Post-menopausal 02/06/2017  . Anxiety 02/06/2017  . Osteopenia 03/01/2017  . Aortic atherosclerosis ([place]) 03/16/2017  . Liver nodule 03/25/2017  . Chronic venous stasis dermatitis of both lower extremities 03/25/2017  . Chronic pain of both knees 05/12/2017  . Pernicious anemia 08/29/2017  . Primary osteoarthritis involving multiple joints 11/25/2017  . Bilateral lower leg cellulitis 11/25/2017  . Abdominal pain, RLQ 11/25/2017  . Abdominal distension (gaseous) 11/25/2017  . Liver cirrhosis secondary to NASH ([place]) 03/21/2017  . Elevated fasting glucose 02/27/2018  . Adenomatous colon polyp 02/28/2018  . Seborrheic dermatitis 06/04/2018  . Hammer toes of both feet 06/05/2018  . Left breast mass 06/05/2018  . Cellulitis of right leg 06/05/2018  . Elevated serum creatinine 07/02/2018  . Hypomagnesemia 09/15/2018  . Hyponatremia 09/15/2018  . Incontinence in female 03/25/2019  .  Metatarsalgia of both feet 05/03/2019  . Ganglion cyst of dorsum of right wrist 05/03/2019  . Lymphedema 07/16/2019  . Chronic gastric ulcer without hemorrhage and without perforation 08/29/2019  . Gastritis 08/29/2019   Resolved Ambulatory Problems    Diagnosis Date Noted  . Sweating profusely  08/29/2017  . Second degree burn of abdomen 02/27/2018  . Skin lesion 06/05/2018   Past Medical History:  Diagnosis Date  . Anemia   . Arthritis   . Cataracts, bilateral   . Chronic kidney disease   . Diabetes ([place])   . Family history of coronary artery disease   . Fatty liver   . GERD (gastroesophageal reflux disease)   . Headache   . Morbid obesity ([place])   . Peripheral vascular disease ([place])   . PONV (postoperative nausea and vomiting)   . Pulmonary embolism ([place]) 2001  . Restless leg syndrome   . Shortness of breath dyspnea   . Sleep apnea   . Venous stasis dermatitis of both lower extremities       Review of Systems See HPI.     Objective:   Physical Exam Vitals reviewed.  Constitutional:      Appearance: Normal appearance. She is obese.     Comments: Non ambulatory.   HENT:     Head: Normocephalic.  Cardiovascular:     Rate and Rhythm: Normal rate and regular rhythm.     Pulses: Normal pulses.     Heart sounds: Normal heart sounds.  Pulmonary:     Effort: Pulmonary effort is normal.     Breath sounds: Normal breath sounds.  Musculoskeletal:     Comments: No significant lower extremity edema. Legs cool to touch.   Neurological:     General: No focal deficit present.     Mental Status: She is alert and oriented to person, place, and time.  Psychiatric:        Mood and Affect: Mood normal.           Assessment & Plan:  [name] KitchenMarland KitchenDoris was seen today for follow-up.  Diagnoses and all orders for this visit:  Type 2 diabetes mellitus with hyperglycemia, without long-term current use of insulin (HCC) -     POCT glycosylated hemoglobin (Hb A1C) -     COMPLETE METABOLIC PANEL WITH GFR -     CBC with Differential/Platelet -     Lipid Panel w/reflex Direct LDL -     Insulin NPH, Human,, Isophane, (NOVOLIN N FLEXPEN) 100 UNIT/ML Kiwkpen; Inject 8 Units into the skin at bedtime. -     dapagliflozin propanediol (FARXIGA) 10 MG TABS tablet; Take 1 tablet (10 mg  total) by mouth daily before breakfast.  Mixed hyperlipidemia -     Lipid Panel w/reflex Direct LDL  Need for pneumococcal vaccine -     Pneumococcal polysaccharide vaccine 23-valent greater than or equal to 2yo subcutaneous/IM  Essential hypertension  Chronic venous stasis dermatitis of both lower extremities  Primary osteoarthritis involving multiple joints    Lab Results  Component Value Date   HGBA1C 7.4 (A) 05/27/2020   A1C is not to goal.  Due to CKD need to avoid metformin and glipizide Need to call pharmacy and confirm what medications she is on.  Needs eye exam.  BP to goal.  On statin.  Pneumonia vaccine given today.  Follow up in 3 months.   Fasting labs ordered.   Medications to start:  novolin N 8 units at night.  Trulicity once a week.  faraxiga daily.   Urge incontinence managed by urology with catheter.   Increased hydrocodone to 2-3 tablets daily but only gave 75 so that she does not take 3 tablets every day.  [name] Kitchen.PDMP reviewed during this encounter. Pain contract signed today.  UDS up to date.  She does have xanax and aware of the sudden death increased risk as well as fall risk. She will not take together.

## 2020-05-28 ENCOUNTER — Telehealth: Payer: Self-pay | Admitting: Physician Assistant

## 2020-05-28 NOTE — Telephone Encounter (Signed)
Can we please call pharmacy and get complete list of what they fill for diabetes? I am trying to come up with a plan but I need to know what she is currently taking?

## 2020-05-29 NOTE — Telephone Encounter (Signed)
Spoke with multiple pharmacies.   [facility] - most recent diabetes medication was Glipizide 5 mg - 90 day supply in October 2020. No others filled there.   [facility] - most recent Novolog Flex pen sliding scale QID filled at the beginning of May 2021 for one month.   [facility] Trulicity [facility] [id number]  No other diabetes medications and no other past pharmacies on file for patient.

## 2020-05-30 ENCOUNTER — Telehealth: Payer: Self-pay | Admitting: Physician Assistant

## 2020-05-30 ENCOUNTER — Other Ambulatory Visit: Payer: Self-pay | Admitting: Physician Assistant

## 2020-05-30 DIAGNOSIS — G8929 Other chronic pain: Secondary | ICD-10-CM

## 2020-05-30 DIAGNOSIS — M25562 Pain in left knee: Secondary | ICD-10-CM

## 2020-05-30 DIAGNOSIS — Z0289 Encounter for other administrative examinations: Secondary | ICD-10-CM

## 2020-05-30 MED ORDER — HYDROCODONE-ACETAMINOPHEN 5-325 MG PO TABS: 1.0000 | ORAL_TABLET | Freq: Three times a day (TID) | ORAL | 0 refills | Status: DC | PRN

## 2020-05-30 MED ORDER — DAPAGLIFLOZIN PROPANEDIOL 10 MG PO TABS: 10.0000 mg | ORAL_TABLET | Freq: Every day | ORAL | 2 refills | Status: AC

## 2020-05-30 MED ORDER — NOVOLIN N FLEXPEN 100 UNIT/ML ~~LOC~~ SUPN: 8.0000 [IU] | PEN_INJECTOR | Freq: Every day | SUBCUTANEOUS | 1 refills | Status: DC

## 2020-05-30 NOTE — Telephone Encounter (Signed)
Called and left patient very thorough detailed voicemail about new medication plans and directions. Call back information given

## 2020-05-30 NOTE — Telephone Encounter (Signed)
Please call patient today:  Call pharmacy and got drug information. This is the plan to control your sugars and help protect your kidneys.   Start faraxiga 10mg  daily this will help protect kidneys and help you urinate off some sugar daily.  Start weekly trulicity. I sent this again please have pharmacy call us if not affordable we might can get your patient assistance.  Start novolin N 8 units at bedtime. This will replace your expensive novolog pen.   Everything sent to pharmacy.

## 2020-05-30 NOTE — Telephone Encounter (Signed)
Patient called asking for [facility]. Provider notified.

## 2020-06-09 DIAGNOSIS — Z6841 Body Mass Index (BMI) 40.0 and over, adult: Secondary | ICD-10-CM | POA: Diagnosis not present

## 2020-06-09 DIAGNOSIS — R159 Full incontinence of feces: Secondary | ICD-10-CM | POA: Diagnosis not present

## 2020-06-09 DIAGNOSIS — N952 Postmenopausal atrophic vaginitis: Secondary | ICD-10-CM | POA: Diagnosis not present

## 2020-06-09 DIAGNOSIS — N3946 Mixed incontinence: Secondary | ICD-10-CM | POA: Diagnosis not present

## 2020-06-09 DIAGNOSIS — Z466 Encounter for fitting and adjustment of urinary device: Secondary | ICD-10-CM | POA: Diagnosis not present

## 2020-06-09 DIAGNOSIS — Z86711 Personal history of pulmonary embolism: Secondary | ICD-10-CM | POA: Diagnosis not present

## 2020-06-09 DIAGNOSIS — E119 Type 2 diabetes mellitus without complications: Secondary | ICD-10-CM | POA: Diagnosis not present

## 2020-06-09 DIAGNOSIS — N3281 Overactive bladder: Secondary | ICD-10-CM | POA: Diagnosis not present

## 2020-06-10 ENCOUNTER — Telehealth: Payer: Self-pay | Admitting: Physician Assistant

## 2020-06-10 DIAGNOSIS — M159 Polyosteoarthritis, unspecified: Secondary | ICD-10-CM

## 2020-06-10 DIAGNOSIS — M8949 Other hypertrophic osteoarthropathy, multiple sites: Secondary | ICD-10-CM

## 2020-06-10 DIAGNOSIS — Z0189 Encounter for other specified special examinations: Secondary | ICD-10-CM | POA: Diagnosis not present

## 2020-06-10 MED ORDER — GABAPENTIN 100 MG PO CAPS: 100.0000 mg | ORAL_CAPSULE | Freq: Three times a day (TID) | ORAL | 0 refills | Status: DC

## 2020-06-10 NOTE — Telephone Encounter (Signed)
Patient needs a refill on gabapentin (NEURONTIN) 100 MG capsule [[id number]]. States that her legs hurt so bad and she is completley out.

## 2020-06-10 NOTE — Telephone Encounter (Signed)
Sent to [facility] pharmacy

## 2020-06-18 ENCOUNTER — Telehealth: Payer: Self-pay

## 2020-06-18 NOTE — Telephone Encounter (Signed)
Its the same molecule essentially so I am fine with it but it is also a branded drug so likely going to be the same cost.

## 2020-06-18 NOTE — Telephone Encounter (Signed)
Patient advised. I did give her information about [employer].    [phone]  Please call [place] [place] Patient Assistance Program at [phone].

## 2020-06-18 NOTE — Telephone Encounter (Signed)
[name] called and states the Trulicity cost $396. She wanted to know what [name] thinks about Rybelsus. Please advise.

## 2020-06-18 NOTE — Telephone Encounter (Signed)
Left message advising of recommendations.  

## 2020-06-23 ENCOUNTER — Other Ambulatory Visit: Payer: Self-pay | Admitting: Physician Assistant

## 2020-06-23 DIAGNOSIS — F331 Major depressive disorder, recurrent, moderate: Secondary | ICD-10-CM

## 2020-06-27 ENCOUNTER — Other Ambulatory Visit: Payer: Self-pay | Admitting: Neurology

## 2020-06-27 DIAGNOSIS — E1165 Type 2 diabetes mellitus with hyperglycemia: Secondary | ICD-10-CM

## 2020-06-27 MED ORDER — INSULIN PEN NEEDLE 30G X 8 MM MISC: 99 refills | Status: DC

## 2020-07-01 ENCOUNTER — Other Ambulatory Visit: Payer: Self-pay

## 2020-07-01 DIAGNOSIS — Z0289 Encounter for other administrative examinations: Secondary | ICD-10-CM

## 2020-07-01 DIAGNOSIS — G8929 Other chronic pain: Secondary | ICD-10-CM

## 2020-07-01 DIAGNOSIS — M25562 Pain in left knee: Secondary | ICD-10-CM

## 2020-07-01 NOTE — Telephone Encounter (Signed)
PT called requesting medication refill.  HYDROcodone-acetaminophen (NORCO/VICODIN) 5-325 MG tablet [[id number]]

## 2020-07-02 ENCOUNTER — Telehealth: Payer: Self-pay | Admitting: Physician Assistant

## 2020-07-02 MED ORDER — HYDROCODONE-ACETAMINOPHEN 5-325 MG PO TABS: 1.0000 | ORAL_TABLET | Freq: Three times a day (TID) | ORAL | 0 refills | Status: DC | PRN

## 2020-07-02 NOTE — Telephone Encounter (Signed)
[name] already sent RX to pharmacy.

## 2020-07-02 NOTE — Telephone Encounter (Signed)
Pt called. She wants refill on her pain meds.  Thanks

## 2020-07-04 NOTE — Telephone Encounter (Signed)
Left message for pt. Thanks

## 2020-07-06 DIAGNOSIS — R05 Cough: Secondary | ICD-10-CM | POA: Diagnosis not present

## 2020-07-06 DIAGNOSIS — I959 Hypotension, unspecified: Secondary | ICD-10-CM | POA: Diagnosis not present

## 2020-07-06 DIAGNOSIS — R0602 Shortness of breath: Secondary | ICD-10-CM | POA: Diagnosis not present

## 2020-07-06 DIAGNOSIS — Y998 Other external cause status: Secondary | ICD-10-CM | POA: Diagnosis not present

## 2020-07-06 DIAGNOSIS — R0902 Hypoxemia: Secondary | ICD-10-CM | POA: Diagnosis not present

## 2020-07-06 DIAGNOSIS — G44309 Post-traumatic headache, unspecified, not intractable: Secondary | ICD-10-CM | POA: Diagnosis not present

## 2020-07-06 DIAGNOSIS — R52 Pain, unspecified: Secondary | ICD-10-CM | POA: Diagnosis not present

## 2020-07-06 DIAGNOSIS — W19XXXA Unspecified fall, initial encounter: Secondary | ICD-10-CM | POA: Diagnosis not present

## 2020-07-06 DIAGNOSIS — R519 Headache, unspecified: Secondary | ICD-10-CM | POA: Diagnosis not present

## 2020-07-06 DIAGNOSIS — R5383 Other fatigue: Secondary | ICD-10-CM | POA: Diagnosis not present

## 2020-07-06 DIAGNOSIS — R531 Weakness: Secondary | ICD-10-CM | POA: Diagnosis not present

## 2020-07-06 DIAGNOSIS — R197 Diarrhea, unspecified: Secondary | ICD-10-CM | POA: Diagnosis not present

## 2020-07-06 DIAGNOSIS — W010XXA Fall on same level from slipping, tripping and stumbling without subsequent striking against object, initial encounter: Secondary | ICD-10-CM | POA: Diagnosis not present

## 2020-07-06 DIAGNOSIS — M542 Cervicalgia: Secondary | ICD-10-CM | POA: Diagnosis not present

## 2020-07-07 DIAGNOSIS — G44309 Post-traumatic headache, unspecified, not intractable: Secondary | ICD-10-CM | POA: Diagnosis not present

## 2020-07-07 DIAGNOSIS — W19XXXA Unspecified fall, initial encounter: Secondary | ICD-10-CM | POA: Diagnosis not present

## 2020-07-07 DIAGNOSIS — R0602 Shortness of breath: Secondary | ICD-10-CM | POA: Diagnosis not present

## 2020-07-07 DIAGNOSIS — M542 Cervicalgia: Secondary | ICD-10-CM | POA: Diagnosis not present

## 2020-07-12 ENCOUNTER — Other Ambulatory Visit: Payer: Self-pay | Admitting: Physician Assistant

## 2020-07-12 DIAGNOSIS — M159 Polyosteoarthritis, unspecified: Secondary | ICD-10-CM

## 2020-07-12 DIAGNOSIS — M8949 Other hypertrophic osteoarthropathy, multiple sites: Secondary | ICD-10-CM

## 2020-07-14 NOTE — Telephone Encounter (Signed)
Looks like patient established with internal medicine at [facility] Are you still prescribing for her?

## 2020-07-16 DIAGNOSIS — N3946 Mixed incontinence: Secondary | ICD-10-CM | POA: Diagnosis not present

## 2020-07-16 DIAGNOSIS — R159 Full incontinence of feces: Secondary | ICD-10-CM | POA: Diagnosis not present

## 2020-07-21 DIAGNOSIS — R531 Weakness: Secondary | ICD-10-CM | POA: Diagnosis not present

## 2020-07-21 DIAGNOSIS — M25519 Pain in unspecified shoulder: Secondary | ICD-10-CM | POA: Diagnosis not present

## 2020-07-21 DIAGNOSIS — R5383 Other fatigue: Secondary | ICD-10-CM | POA: Diagnosis not present

## 2020-07-21 DIAGNOSIS — W19XXXA Unspecified fall, initial encounter: Secondary | ICD-10-CM | POA: Diagnosis not present

## 2020-07-21 DIAGNOSIS — Z20822 Contact with and (suspected) exposure to covid-19: Secondary | ICD-10-CM | POA: Diagnosis not present

## 2020-07-21 DIAGNOSIS — R42 Dizziness and giddiness: Secondary | ICD-10-CM | POA: Diagnosis not present

## 2020-07-21 DIAGNOSIS — R9431 Abnormal electrocardiogram [ECG] [EKG]: Secondary | ICD-10-CM | POA: Diagnosis not present

## 2020-07-21 DIAGNOSIS — E1122 Type 2 diabetes mellitus with diabetic chronic kidney disease: Secondary | ICD-10-CM | POA: Diagnosis not present

## 2020-07-21 DIAGNOSIS — R52 Pain, unspecified: Secondary | ICD-10-CM | POA: Diagnosis not present

## 2020-07-21 DIAGNOSIS — R0689 Other abnormalities of breathing: Secondary | ICD-10-CM | POA: Diagnosis not present

## 2020-07-21 DIAGNOSIS — E871 Hypo-osmolality and hyponatremia: Secondary | ICD-10-CM | POA: Diagnosis not present

## 2020-07-21 DIAGNOSIS — M7989 Other specified soft tissue disorders: Secondary | ICD-10-CM | POA: Diagnosis not present

## 2020-07-21 DIAGNOSIS — W1839XA Other fall on same level, initial encounter: Secondary | ICD-10-CM | POA: Diagnosis not present

## 2020-07-21 DIAGNOSIS — N183 Chronic kidney disease, stage 3 unspecified: Secondary | ICD-10-CM | POA: Diagnosis not present

## 2020-07-21 DIAGNOSIS — H8110 Benign paroxysmal vertigo, unspecified ear: Secondary | ICD-10-CM | POA: Diagnosis not present

## 2020-07-21 DIAGNOSIS — R102 Pelvic and perineal pain: Secondary | ICD-10-CM | POA: Diagnosis not present

## 2020-07-21 DIAGNOSIS — R269 Unspecified abnormalities of gait and mobility: Secondary | ICD-10-CM | POA: Diagnosis not present

## 2020-07-21 DIAGNOSIS — R519 Headache, unspecified: Secondary | ICD-10-CM | POA: Diagnosis not present

## 2020-07-21 DIAGNOSIS — Y998 Other external cause status: Secondary | ICD-10-CM | POA: Diagnosis not present

## 2020-07-21 DIAGNOSIS — J029 Acute pharyngitis, unspecified: Secondary | ICD-10-CM | POA: Diagnosis not present

## 2020-07-21 DIAGNOSIS — Z6841 Body Mass Index (BMI) 40.0 and over, adult: Secondary | ICD-10-CM | POA: Diagnosis not present

## 2020-07-22 DIAGNOSIS — I959 Hypotension, unspecified: Secondary | ICD-10-CM | POA: Diagnosis not present

## 2020-07-22 DIAGNOSIS — R52 Pain, unspecified: Secondary | ICD-10-CM | POA: Diagnosis not present

## 2020-07-22 DIAGNOSIS — M25512 Pain in left shoulder: Secondary | ICD-10-CM | POA: Diagnosis not present

## 2020-07-22 DIAGNOSIS — R9431 Abnormal electrocardiogram [ECG] [EKG]: Secondary | ICD-10-CM | POA: Diagnosis not present

## 2020-07-22 DIAGNOSIS — M7989 Other specified soft tissue disorders: Secondary | ICD-10-CM | POA: Diagnosis not present

## 2020-07-22 DIAGNOSIS — W19XXXA Unspecified fall, initial encounter: Secondary | ICD-10-CM | POA: Diagnosis not present

## 2020-07-22 DIAGNOSIS — M25519 Pain in unspecified shoulder: Secondary | ICD-10-CM | POA: Diagnosis not present

## 2020-07-22 DIAGNOSIS — W1839XA Other fall on same level, initial encounter: Secondary | ICD-10-CM | POA: Diagnosis not present

## 2020-07-22 DIAGNOSIS — J029 Acute pharyngitis, unspecified: Secondary | ICD-10-CM | POA: Diagnosis not present

## 2020-07-22 DIAGNOSIS — R531 Weakness: Secondary | ICD-10-CM | POA: Diagnosis not present

## 2020-07-22 DIAGNOSIS — M542 Cervicalgia: Secondary | ICD-10-CM | POA: Diagnosis not present

## 2020-07-22 DIAGNOSIS — R102 Pelvic and perineal pain: Secondary | ICD-10-CM | POA: Diagnosis not present

## 2020-07-22 DIAGNOSIS — Y998 Other external cause status: Secondary | ICD-10-CM | POA: Diagnosis not present

## 2020-07-22 DIAGNOSIS — R42 Dizziness and giddiness: Secondary | ICD-10-CM | POA: Diagnosis not present

## 2020-07-22 DIAGNOSIS — R5383 Other fatigue: Secondary | ICD-10-CM | POA: Diagnosis not present

## 2020-07-22 DIAGNOSIS — Z043 Encounter for examination and observation following other accident: Secondary | ICD-10-CM | POA: Diagnosis not present

## 2020-07-22 DIAGNOSIS — R519 Headache, unspecified: Secondary | ICD-10-CM | POA: Diagnosis not present

## 2020-07-23 DIAGNOSIS — N183 Chronic kidney disease, stage 3 unspecified: Secondary | ICD-10-CM | POA: Diagnosis present

## 2020-07-23 DIAGNOSIS — R32 Unspecified urinary incontinence: Secondary | ICD-10-CM | POA: Diagnosis not present

## 2020-07-23 DIAGNOSIS — E1122 Type 2 diabetes mellitus with diabetic chronic kidney disease: Secondary | ICD-10-CM | POA: Diagnosis present

## 2020-07-23 DIAGNOSIS — M549 Dorsalgia, unspecified: Secondary | ICD-10-CM | POA: Diagnosis not present

## 2020-07-23 DIAGNOSIS — Z86718 Personal history of other venous thrombosis and embolism: Secondary | ICD-10-CM | POA: Diagnosis not present

## 2020-07-23 DIAGNOSIS — D509 Iron deficiency anemia, unspecified: Secondary | ICD-10-CM | POA: Diagnosis not present

## 2020-07-23 DIAGNOSIS — R161 Splenomegaly, not elsewhere classified: Secondary | ICD-10-CM | POA: Diagnosis not present

## 2020-07-23 DIAGNOSIS — J45909 Unspecified asthma, uncomplicated: Secondary | ICD-10-CM | POA: Diagnosis not present

## 2020-07-23 DIAGNOSIS — Z743 Need for continuous supervision: Secondary | ICD-10-CM | POA: Diagnosis not present

## 2020-07-23 DIAGNOSIS — K219 Gastro-esophageal reflux disease without esophagitis: Secondary | ICD-10-CM | POA: Diagnosis present

## 2020-07-23 DIAGNOSIS — L89891 Pressure ulcer of other site, stage 1: Secondary | ICD-10-CM | POA: Diagnosis present

## 2020-07-23 DIAGNOSIS — L89310 Pressure ulcer of right buttock, unstageable: Secondary | ICD-10-CM | POA: Diagnosis not present

## 2020-07-23 DIAGNOSIS — R42 Dizziness and giddiness: Secondary | ICD-10-CM | POA: Diagnosis not present

## 2020-07-23 DIAGNOSIS — J449 Chronic obstructive pulmonary disease, unspecified: Secondary | ICD-10-CM | POA: Diagnosis present

## 2020-07-23 DIAGNOSIS — K5900 Constipation, unspecified: Secondary | ICD-10-CM | POA: Diagnosis not present

## 2020-07-23 DIAGNOSIS — L89312 Pressure ulcer of right buttock, stage 2: Secondary | ICD-10-CM | POA: Diagnosis present

## 2020-07-23 DIAGNOSIS — K7581 Nonalcoholic steatohepatitis (NASH): Secondary | ICD-10-CM | POA: Diagnosis not present

## 2020-07-23 DIAGNOSIS — F418 Other specified anxiety disorders: Secondary | ICD-10-CM | POA: Diagnosis not present

## 2020-07-23 DIAGNOSIS — R5381 Other malaise: Secondary | ICD-10-CM | POA: Diagnosis not present

## 2020-07-23 DIAGNOSIS — I878 Other specified disorders of veins: Secondary | ICD-10-CM | POA: Diagnosis not present

## 2020-07-23 DIAGNOSIS — N390 Urinary tract infection, site not specified: Secondary | ICD-10-CM | POA: Diagnosis not present

## 2020-07-23 DIAGNOSIS — K766 Portal hypertension: Secondary | ICD-10-CM | POA: Diagnosis not present

## 2020-07-23 DIAGNOSIS — K7460 Unspecified cirrhosis of liver: Secondary | ICD-10-CM | POA: Diagnosis present

## 2020-07-23 DIAGNOSIS — E871 Hypo-osmolality and hyponatremia: Secondary | ICD-10-CM | POA: Diagnosis present

## 2020-07-23 DIAGNOSIS — M1990 Unspecified osteoarthritis, unspecified site: Secondary | ICD-10-CM | POA: Diagnosis not present

## 2020-07-23 DIAGNOSIS — Z20822 Contact with and (suspected) exposure to covid-19: Secondary | ICD-10-CM | POA: Diagnosis present

## 2020-07-23 DIAGNOSIS — R55 Syncope and collapse: Secondary | ICD-10-CM | POA: Diagnosis not present

## 2020-07-23 DIAGNOSIS — R911 Solitary pulmonary nodule: Secondary | ICD-10-CM | POA: Diagnosis not present

## 2020-07-23 DIAGNOSIS — Z466 Encounter for fitting and adjustment of urinary device: Secondary | ICD-10-CM | POA: Diagnosis not present

## 2020-07-23 DIAGNOSIS — R9431 Abnormal electrocardiogram [ECG] [EKG]: Secondary | ICD-10-CM | POA: Diagnosis not present

## 2020-07-23 DIAGNOSIS — Z96651 Presence of right artificial knee joint: Secondary | ICD-10-CM | POA: Diagnosis present

## 2020-07-23 DIAGNOSIS — Z978 Presence of other specified devices: Secondary | ICD-10-CM | POA: Diagnosis not present

## 2020-07-23 DIAGNOSIS — R279 Unspecified lack of coordination: Secondary | ICD-10-CM | POA: Diagnosis not present

## 2020-07-23 DIAGNOSIS — R296 Repeated falls: Secondary | ICD-10-CM | POA: Diagnosis present

## 2020-07-23 DIAGNOSIS — E875 Hyperkalemia: Secondary | ICD-10-CM | POA: Diagnosis present

## 2020-07-23 DIAGNOSIS — Z6841 Body Mass Index (BMI) 40.0 and over, adult: Secondary | ICD-10-CM | POA: Diagnosis not present

## 2020-07-23 DIAGNOSIS — E1136 Type 2 diabetes mellitus with diabetic cataract: Secondary | ICD-10-CM | POA: Diagnosis present

## 2020-07-23 DIAGNOSIS — I129 Hypertensive chronic kidney disease with stage 1 through stage 4 chronic kidney disease, or unspecified chronic kidney disease: Secondary | ICD-10-CM | POA: Diagnosis present

## 2020-07-23 DIAGNOSIS — W19XXXA Unspecified fall, initial encounter: Secondary | ICD-10-CM | POA: Diagnosis not present

## 2020-07-23 DIAGNOSIS — E559 Vitamin D deficiency, unspecified: Secondary | ICD-10-CM | POA: Diagnosis not present

## 2020-07-23 DIAGNOSIS — H8110 Benign paroxysmal vertigo, unspecified ear: Secondary | ICD-10-CM | POA: Diagnosis present

## 2020-07-23 DIAGNOSIS — L89320 Pressure ulcer of left buttock, unstageable: Secondary | ICD-10-CM | POA: Diagnosis not present

## 2020-07-23 DIAGNOSIS — H25813 Combined forms of age-related cataract, bilateral: Secondary | ICD-10-CM | POA: Diagnosis not present

## 2020-07-23 DIAGNOSIS — Z90710 Acquired absence of both cervix and uterus: Secondary | ICD-10-CM | POA: Diagnosis not present

## 2020-07-23 DIAGNOSIS — I959 Hypotension, unspecified: Secondary | ICD-10-CM | POA: Diagnosis not present

## 2020-07-23 DIAGNOSIS — Z794 Long term (current) use of insulin: Secondary | ICD-10-CM | POA: Diagnosis not present

## 2020-07-23 DIAGNOSIS — G4733 Obstructive sleep apnea (adult) (pediatric): Secondary | ICD-10-CM | POA: Diagnosis present

## 2020-07-25 ENCOUNTER — Other Ambulatory Visit: Payer: Self-pay | Admitting: Physician Assistant

## 2020-07-25 DIAGNOSIS — E119 Type 2 diabetes mellitus without complications: Secondary | ICD-10-CM

## 2020-07-26 DIAGNOSIS — L97523 Non-pressure chronic ulcer of other part of left foot with necrosis of muscle: Secondary | ICD-10-CM | POA: Diagnosis not present

## 2020-07-26 DIAGNOSIS — R5381 Other malaise: Secondary | ICD-10-CM | POA: Diagnosis not present

## 2020-07-26 DIAGNOSIS — Z466 Encounter for fitting and adjustment of urinary device: Secondary | ICD-10-CM | POA: Diagnosis not present

## 2020-07-26 DIAGNOSIS — L89320 Pressure ulcer of left buttock, unstageable: Secondary | ICD-10-CM | POA: Diagnosis not present

## 2020-07-26 DIAGNOSIS — L89132 Pressure ulcer of right lower back, stage 2: Secondary | ICD-10-CM | POA: Diagnosis not present

## 2020-07-26 DIAGNOSIS — L89310 Pressure ulcer of right buttock, unstageable: Secondary | ICD-10-CM | POA: Diagnosis not present

## 2020-07-26 DIAGNOSIS — I959 Hypotension, unspecified: Secondary | ICD-10-CM | POA: Diagnosis not present

## 2020-07-26 DIAGNOSIS — M1990 Unspecified osteoarthritis, unspecified site: Secondary | ICD-10-CM | POA: Diagnosis not present

## 2020-07-26 DIAGNOSIS — R279 Unspecified lack of coordination: Secondary | ICD-10-CM | POA: Diagnosis not present

## 2020-07-26 DIAGNOSIS — L89142 Pressure ulcer of left lower back, stage 2: Secondary | ICD-10-CM | POA: Diagnosis not present

## 2020-07-26 DIAGNOSIS — E875 Hyperkalemia: Secondary | ICD-10-CM | POA: Diagnosis not present

## 2020-07-26 DIAGNOSIS — I878 Other specified disorders of veins: Secondary | ICD-10-CM | POA: Diagnosis not present

## 2020-07-26 DIAGNOSIS — N3281 Overactive bladder: Secondary | ICD-10-CM | POA: Diagnosis not present

## 2020-07-26 DIAGNOSIS — G4733 Obstructive sleep apnea (adult) (pediatric): Secondary | ICD-10-CM | POA: Diagnosis not present

## 2020-07-26 DIAGNOSIS — K7581 Nonalcoholic steatohepatitis (NASH): Secondary | ICD-10-CM | POA: Diagnosis not present

## 2020-07-26 DIAGNOSIS — F418 Other specified anxiety disorders: Secondary | ICD-10-CM | POA: Diagnosis not present

## 2020-07-26 DIAGNOSIS — R531 Weakness: Secondary | ICD-10-CM | POA: Diagnosis not present

## 2020-07-26 DIAGNOSIS — E1122 Type 2 diabetes mellitus with diabetic chronic kidney disease: Secondary | ICD-10-CM | POA: Diagnosis not present

## 2020-07-26 DIAGNOSIS — E559 Vitamin D deficiency, unspecified: Secondary | ICD-10-CM | POA: Diagnosis not present

## 2020-07-26 DIAGNOSIS — R55 Syncope and collapse: Secondary | ICD-10-CM | POA: Diagnosis not present

## 2020-07-26 DIAGNOSIS — G479 Sleep disorder, unspecified: Secondary | ICD-10-CM | POA: Diagnosis not present

## 2020-07-26 DIAGNOSIS — R42 Dizziness and giddiness: Secondary | ICD-10-CM | POA: Diagnosis not present

## 2020-07-26 DIAGNOSIS — J45909 Unspecified asthma, uncomplicated: Secondary | ICD-10-CM | POA: Diagnosis not present

## 2020-07-26 DIAGNOSIS — R32 Unspecified urinary incontinence: Secondary | ICD-10-CM | POA: Diagnosis not present

## 2020-07-26 DIAGNOSIS — F419 Anxiety disorder, unspecified: Secondary | ICD-10-CM | POA: Diagnosis not present

## 2020-07-26 DIAGNOSIS — Z743 Need for continuous supervision: Secondary | ICD-10-CM | POA: Diagnosis not present

## 2020-07-26 DIAGNOSIS — Z794 Long term (current) use of insulin: Secondary | ICD-10-CM | POA: Diagnosis not present

## 2020-07-26 DIAGNOSIS — J449 Chronic obstructive pulmonary disease, unspecified: Secondary | ICD-10-CM | POA: Diagnosis not present

## 2020-07-26 DIAGNOSIS — Z79899 Other long term (current) drug therapy: Secondary | ICD-10-CM | POA: Diagnosis not present

## 2020-07-26 DIAGNOSIS — H25813 Combined forms of age-related cataract, bilateral: Secondary | ICD-10-CM | POA: Diagnosis not present

## 2020-07-26 DIAGNOSIS — I129 Hypertensive chronic kidney disease with stage 1 through stage 4 chronic kidney disease, or unspecified chronic kidney disease: Secondary | ICD-10-CM | POA: Diagnosis not present

## 2020-07-26 DIAGNOSIS — N183 Chronic kidney disease, stage 3 unspecified: Secondary | ICD-10-CM | POA: Diagnosis not present

## 2020-07-26 DIAGNOSIS — K219 Gastro-esophageal reflux disease without esophagitis: Secondary | ICD-10-CM | POA: Diagnosis not present

## 2020-07-26 DIAGNOSIS — N390 Urinary tract infection, site not specified: Secondary | ICD-10-CM | POA: Diagnosis not present

## 2020-07-26 DIAGNOSIS — H8110 Benign paroxysmal vertigo, unspecified ear: Secondary | ICD-10-CM | POA: Diagnosis not present

## 2020-07-26 DIAGNOSIS — D509 Iron deficiency anemia, unspecified: Secondary | ICD-10-CM | POA: Diagnosis not present

## 2020-07-26 DIAGNOSIS — K5900 Constipation, unspecified: Secondary | ICD-10-CM | POA: Diagnosis not present

## 2020-07-26 DIAGNOSIS — D2330 Other benign neoplasm of skin of unspecified part of face: Secondary | ICD-10-CM | POA: Diagnosis not present

## 2020-07-26 DIAGNOSIS — R296 Repeated falls: Secondary | ICD-10-CM | POA: Diagnosis not present

## 2020-07-26 DIAGNOSIS — Z978 Presence of other specified devices: Secondary | ICD-10-CM | POA: Diagnosis not present

## 2020-07-26 DIAGNOSIS — L89140 Pressure ulcer of left lower back, unstageable: Secondary | ICD-10-CM | POA: Diagnosis not present

## 2020-07-26 DIAGNOSIS — K7460 Unspecified cirrhosis of liver: Secondary | ICD-10-CM | POA: Diagnosis not present

## 2020-07-28 DIAGNOSIS — H8110 Benign paroxysmal vertigo, unspecified ear: Secondary | ICD-10-CM | POA: Diagnosis not present

## 2020-07-28 DIAGNOSIS — E1122 Type 2 diabetes mellitus with diabetic chronic kidney disease: Secondary | ICD-10-CM | POA: Diagnosis not present

## 2020-07-28 DIAGNOSIS — K7460 Unspecified cirrhosis of liver: Secondary | ICD-10-CM | POA: Diagnosis not present

## 2020-07-28 DIAGNOSIS — R5381 Other malaise: Secondary | ICD-10-CM | POA: Diagnosis not present

## 2020-07-28 DIAGNOSIS — R32 Unspecified urinary incontinence: Secondary | ICD-10-CM | POA: Diagnosis not present

## 2020-07-28 DIAGNOSIS — R296 Repeated falls: Secondary | ICD-10-CM | POA: Diagnosis not present

## 2020-07-28 DIAGNOSIS — Z978 Presence of other specified devices: Secondary | ICD-10-CM | POA: Diagnosis not present

## 2020-07-31 DIAGNOSIS — Z79899 Other long term (current) drug therapy: Secondary | ICD-10-CM | POA: Diagnosis not present

## 2020-07-31 DIAGNOSIS — D2330 Other benign neoplasm of skin of unspecified part of face: Secondary | ICD-10-CM | POA: Diagnosis not present

## 2020-08-04 DIAGNOSIS — L97523 Non-pressure chronic ulcer of other part of left foot with necrosis of muscle: Secondary | ICD-10-CM | POA: Diagnosis not present

## 2020-08-04 DIAGNOSIS — R531 Weakness: Secondary | ICD-10-CM | POA: Diagnosis not present

## 2020-08-04 DIAGNOSIS — R296 Repeated falls: Secondary | ICD-10-CM | POA: Diagnosis not present

## 2020-08-04 DIAGNOSIS — L89140 Pressure ulcer of left lower back, unstageable: Secondary | ICD-10-CM | POA: Diagnosis not present

## 2020-08-11 DIAGNOSIS — L89142 Pressure ulcer of left lower back, stage 2: Secondary | ICD-10-CM | POA: Diagnosis not present

## 2020-08-14 DIAGNOSIS — G479 Sleep disorder, unspecified: Secondary | ICD-10-CM | POA: Diagnosis not present

## 2020-08-14 DIAGNOSIS — F419 Anxiety disorder, unspecified: Secondary | ICD-10-CM | POA: Diagnosis not present

## 2020-08-18 DIAGNOSIS — E1122 Type 2 diabetes mellitus with diabetic chronic kidney disease: Secondary | ICD-10-CM | POA: Diagnosis not present

## 2020-08-18 DIAGNOSIS — R5381 Other malaise: Secondary | ICD-10-CM | POA: Diagnosis not present

## 2020-08-18 DIAGNOSIS — R32 Unspecified urinary incontinence: Secondary | ICD-10-CM | POA: Diagnosis not present

## 2020-08-18 DIAGNOSIS — H8110 Benign paroxysmal vertigo, unspecified ear: Secondary | ICD-10-CM | POA: Diagnosis not present

## 2020-08-18 DIAGNOSIS — N3281 Overactive bladder: Secondary | ICD-10-CM | POA: Diagnosis not present

## 2020-08-18 DIAGNOSIS — Z978 Presence of other specified devices: Secondary | ICD-10-CM | POA: Diagnosis not present

## 2020-08-18 DIAGNOSIS — K7460 Unspecified cirrhosis of liver: Secondary | ICD-10-CM | POA: Diagnosis not present

## 2020-08-18 DIAGNOSIS — R296 Repeated falls: Secondary | ICD-10-CM | POA: Diagnosis not present

## 2020-08-18 DIAGNOSIS — Z466 Encounter for fitting and adjustment of urinary device: Secondary | ICD-10-CM | POA: Diagnosis not present

## 2020-08-19 DIAGNOSIS — L89132 Pressure ulcer of right lower back, stage 2: Secondary | ICD-10-CM | POA: Diagnosis not present

## 2020-08-19 DIAGNOSIS — K7460 Unspecified cirrhosis of liver: Secondary | ICD-10-CM | POA: Diagnosis not present

## 2020-08-19 DIAGNOSIS — E1122 Type 2 diabetes mellitus with diabetic chronic kidney disease: Secondary | ICD-10-CM | POA: Diagnosis not present

## 2020-08-19 DIAGNOSIS — R296 Repeated falls: Secondary | ICD-10-CM | POA: Diagnosis not present

## 2020-08-19 DIAGNOSIS — H8110 Benign paroxysmal vertigo, unspecified ear: Secondary | ICD-10-CM | POA: Diagnosis not present

## 2020-08-19 DIAGNOSIS — Z978 Presence of other specified devices: Secondary | ICD-10-CM | POA: Diagnosis not present

## 2020-08-19 DIAGNOSIS — R5381 Other malaise: Secondary | ICD-10-CM | POA: Diagnosis not present

## 2020-08-19 DIAGNOSIS — R32 Unspecified urinary incontinence: Secondary | ICD-10-CM | POA: Diagnosis not present

## 2020-08-21 DIAGNOSIS — H8110 Benign paroxysmal vertigo, unspecified ear: Secondary | ICD-10-CM | POA: Diagnosis not present

## 2020-08-25 DIAGNOSIS — K7460 Unspecified cirrhosis of liver: Secondary | ICD-10-CM | POA: Diagnosis not present

## 2020-08-25 DIAGNOSIS — N189 Chronic kidney disease, unspecified: Secondary | ICD-10-CM | POA: Diagnosis not present

## 2020-08-25 DIAGNOSIS — E1169 Type 2 diabetes mellitus with other specified complication: Secondary | ICD-10-CM | POA: Diagnosis not present

## 2020-08-25 DIAGNOSIS — Z888 Allergy status to other drugs, medicaments and biological substances status: Secondary | ICD-10-CM | POA: Diagnosis not present

## 2020-08-25 DIAGNOSIS — Z881 Allergy status to other antibiotic agents status: Secondary | ICD-10-CM | POA: Diagnosis not present

## 2020-08-25 DIAGNOSIS — K7581 Nonalcoholic steatohepatitis (NASH): Secondary | ICD-10-CM | POA: Diagnosis not present

## 2020-08-25 DIAGNOSIS — Z79899 Other long term (current) drug therapy: Secondary | ICD-10-CM | POA: Diagnosis not present

## 2020-08-25 DIAGNOSIS — E1122 Type 2 diabetes mellitus with diabetic chronic kidney disease: Secondary | ICD-10-CM | POA: Diagnosis not present

## 2020-08-25 DIAGNOSIS — Z794 Long term (current) use of insulin: Secondary | ICD-10-CM | POA: Diagnosis not present

## 2020-08-27 ENCOUNTER — Ambulatory Visit: Payer: Medicare Other | Admitting: Physician Assistant

## 2020-09-02 ENCOUNTER — Ambulatory Visit: Payer: Medicare Other | Admitting: Physician Assistant

## 2020-09-05 ENCOUNTER — Encounter: Payer: Self-pay | Admitting: Physician Assistant

## 2020-09-05 ENCOUNTER — Ambulatory Visit (INDEPENDENT_AMBULATORY_CARE_PROVIDER_SITE_OTHER): Payer: Medicare Other | Admitting: Physician Assistant

## 2020-09-05 VITALS — BP 121/46 | HR 83 | Ht 59.5 in | Wt 274.0 lb

## 2020-09-05 DIAGNOSIS — M25562 Pain in left knee: Secondary | ICD-10-CM

## 2020-09-05 DIAGNOSIS — K219 Gastro-esophageal reflux disease without esophagitis: Secondary | ICD-10-CM | POA: Diagnosis not present

## 2020-09-05 DIAGNOSIS — E785 Hyperlipidemia, unspecified: Secondary | ICD-10-CM | POA: Diagnosis not present

## 2020-09-05 DIAGNOSIS — Z23 Encounter for immunization: Secondary | ICD-10-CM | POA: Diagnosis not present

## 2020-09-05 DIAGNOSIS — D519 Vitamin B12 deficiency anemia, unspecified: Secondary | ICD-10-CM

## 2020-09-05 DIAGNOSIS — Z0289 Encounter for other administrative examinations: Secondary | ICD-10-CM | POA: Diagnosis not present

## 2020-09-05 DIAGNOSIS — D510 Vitamin B12 deficiency anemia due to intrinsic factor deficiency: Secondary | ICD-10-CM

## 2020-09-05 DIAGNOSIS — J42 Unspecified chronic bronchitis: Secondary | ICD-10-CM

## 2020-09-05 DIAGNOSIS — E1165 Type 2 diabetes mellitus with hyperglycemia: Secondary | ICD-10-CM | POA: Diagnosis not present

## 2020-09-05 DIAGNOSIS — I10 Essential (primary) hypertension: Secondary | ICD-10-CM

## 2020-09-05 DIAGNOSIS — M25561 Pain in right knee: Secondary | ICD-10-CM | POA: Diagnosis not present

## 2020-09-05 DIAGNOSIS — M25511 Pain in right shoulder: Secondary | ICD-10-CM

## 2020-09-05 DIAGNOSIS — E559 Vitamin D deficiency, unspecified: Secondary | ICD-10-CM

## 2020-09-05 DIAGNOSIS — F331 Major depressive disorder, recurrent, moderate: Secondary | ICD-10-CM

## 2020-09-05 DIAGNOSIS — Z79899 Other long term (current) drug therapy: Secondary | ICD-10-CM | POA: Diagnosis not present

## 2020-09-05 DIAGNOSIS — G8929 Other chronic pain: Secondary | ICD-10-CM

## 2020-09-05 LAB — POCT GLYCOSYLATED HEMOGLOBIN (HGB A1C): Hemoglobin A1C: 6.1 % — AB (ref 4.0–5.6)

## 2020-09-05 MED ORDER — SPIRONOLACTONE 25 MG PO TABS: ORAL_TABLET | ORAL | 1 refills | Status: DC

## 2020-09-05 MED ORDER — BUDESONIDE-FORMOTEROL FUMARATE 160-4.5 MCG/ACT IN AERO: 2.0000 | INHALATION_SPRAY | Freq: Two times a day (BID) | RESPIRATORY_TRACT | 3 refills | Status: AC

## 2020-09-05 MED ORDER — LOSARTAN POTASSIUM 25 MG PO TABS: 25.0000 mg | ORAL_TABLET | Freq: Every day | ORAL | 1 refills | Status: DC

## 2020-09-05 MED ORDER — CYANOCOBALAMIN 1000 MCG/ML IJ SOLN: 1000.0000 ug | Freq: Once | INTRAMUSCULAR | 0 refills | Status: AC

## 2020-09-05 MED ORDER — FAMOTIDINE 20 MG PO TABS: 20.0000 mg | ORAL_TABLET | Freq: Two times a day (BID) | ORAL | 1 refills | Status: DC

## 2020-09-05 MED ORDER — CYANOCOBALAMIN 1000 MCG/ML IJ SOLN
1000.0000 ug | Freq: Once | INTRAMUSCULAR | Status: AC
  Administered 2020-09-05: 1000 ug via INTRAMUSCULAR

## 2020-09-05 MED ORDER — HYDROCODONE-ACETAMINOPHEN 5-325 MG PO TABS: 1.0000 | ORAL_TABLET | Freq: Three times a day (TID) | ORAL | 0 refills | Status: DC | PRN

## 2020-09-05 MED ORDER — CITALOPRAM HYDROBROMIDE 40 MG PO TABS: 40.0000 mg | ORAL_TABLET | Freq: Every day | ORAL | 1 refills | Status: DC

## 2020-09-05 MED ORDER — VITAMIN D (ERGOCALCIFEROL) 1.25 MG (50000 UNIT) PO CAPS: 50000.0000 [IU] | ORAL_CAPSULE | ORAL | 1 refills | Status: DC

## 2020-09-05 NOTE — Progress Notes (Signed)
r  Subjective:    Patient ID: [name], female    DOB: 02/01/1951, 69 y.o.   MRN: [id number]  HPI  Pt is a 69 yo obese female in wheelchair with T2DM, OSA, COPD, chronic pain, b12 and vitamin D def, HTN, HLD, GERD, MDD, anxiety who presents to the clinic for follow up.   Pt last ED visit was 07/22/2020 for Fall and impaired function. Her electrolytes were off and causing her to fall. She was sent to nursing rehab facility and released August 27th.   She is seeing multiple specialist. Dr. Harrie Foreman for OAB, Dr. Moses-endocrinology for T2DM, Dr. Finis Bud.   She denies any CP, SOB, headache, vision changes, mood changes. She is doing ok today. She wants labs and to get refills on medications.   .. Active Ambulatory Problems    Diagnosis Date Noted  . History of pulmonary embolism 03/20/2014  . Type 2 diabetes mellitus with hyperglycemia, without long-term current use of insulin ([place]) 03/20/2014  . Morbidly obese ([place]) 03/20/2014  . Family history of heart disease 03/20/2014  . Bunion, right foot 03/20/2014  . Frequent headaches 07/20/2016  . Macular degeneration, dry 07/20/2016  . No energy 07/21/2016  . OSA on CPAP 07/21/2016  . Closed fracture of nasal bone 07/21/2016  . Hyperlipidemia 07/21/2016  . Depression 07/21/2016  . Gastroesophageal reflux disease without esophagitis 07/21/2016  . Facial pain 07/21/2016  . Chronic bronchitis ([place]) 07/21/2016  . Bilateral edema of lower extremity 07/21/2016  . B12 deficiency 07/21/2016  . Iron deficiency anemia 07/21/2016  . Anemia due to stage 3 chronic kidney disease ([place]) 08/04/2016  . Insomnia 08/04/2016  . Hypertriglyceridemia 08/06/2016  . Diabetic polyneuropathy associated with diabetes mellitus due to underlying condition ([place]) 11/07/2016  . Vitamin D deficiency 11/07/2016  . Chronic right shoulder pain 02/06/2017  . RLS (restless legs syndrome) 02/06/2017  . Essential hypertension 02/06/2017  . Left hand  pain 02/06/2017  . Post-menopausal 02/06/2017  . Anxiety 02/06/2017  . Osteopenia 03/01/2017  . Aortic atherosclerosis ([place]) 03/16/2017  . Liver nodule 03/25/2017  . Chronic venous stasis dermatitis of both lower extremities 03/25/2017  . Chronic pain of both knees 05/12/2017  . Pernicious anemia 08/29/2017  . Primary osteoarthritis involving multiple joints 11/25/2017  . Bilateral lower leg cellulitis 11/25/2017  . Abdominal pain, RLQ 11/25/2017  . Abdominal distension (gaseous) 11/25/2017  . Liver cirrhosis secondary to NASH ([place]) 03/21/2017  . Elevated fasting glucose 02/27/2018  . Adenomatous colon polyp 02/28/2018  . Seborrheic dermatitis 06/04/2018  . Hammer toes of both feet 06/05/2018  . Left breast mass 06/05/2018  . Cellulitis of right leg 06/05/2018  . Elevated serum creatinine 07/02/2018  . Hypomagnesemia 09/15/2018  . Hyponatremia 09/15/2018  . Incontinence in female 03/25/2019  . Metatarsalgia of both feet 05/03/2019  . Ganglion cyst of dorsum of right wrist 05/03/2019  . Lymphedema 07/16/2019  . Chronic gastric ulcer without hemorrhage and without perforation 08/29/2019  . Gastritis 08/29/2019   Resolved Ambulatory Problems    Diagnosis Date Noted  . Sweating profusely 08/29/2017  . Second degree burn of abdomen 02/27/2018  . Skin lesion 06/05/2018   Past Medical History:  Diagnosis Date  . Anemia   . Arthritis   . Cataracts, bilateral   . Chronic kidney disease   . Diabetes ([place])   . Family history of coronary artery disease   . Fatty liver   . GERD (gastroesophageal reflux disease)   . Headache   . Morbid obesity ([place])   .  Peripheral vascular disease ([place])   . PONV (postoperative nausea and vomiting)   . Pulmonary embolism ([place]) 2001  . Restless leg syndrome   . Shortness of breath dyspnea   . Sleep apnea   . Venous stasis dermatitis of both lower extremities       Review of Systems     Objective:   Physical Exam Vitals reviewed.   Constitutional:      Appearance: Normal appearance. She is obese.  HENT:     Head: Normocephalic.  Neck:     Vascular: No carotid bruit.  Cardiovascular:     Rate and Rhythm: Normal rate and regular rhythm.     Pulses: Normal pulses.  Pulmonary:     Effort: Pulmonary effort is normal.     Breath sounds: Normal breath sounds.  Neurological:     General: No focal deficit present.     Mental Status: She is alert and oriented to person, place, and time.  Psychiatric:        Mood and Affect: Mood normal.        .. Depression screen [facility] 2/9 05/27/2020 10/22/2019 07/13/2019 05/31/2018 08/31/2017  Decreased Interest 0 2 1 2 2   Down, Depressed, Hopeless 0 0 1 2 0  PHQ - 2 Score 0 2 2 4 2   Altered sleeping 2 3 1 3 1   Tired, decreased energy 0 2 1 3 2   Change in appetite 1 0 1 2 1   Feeling bad or failure about yourself  2 0 0 0 0  Trouble concentrating 3 0 0 0 0  Moving slowly or fidgety/restless 2 0 0 0 0  Suicidal thoughts 0 0 0 0 0  PHQ-9 Score 10 7 5 12 6   Difficult doing work/chores - Not difficult at all Somewhat difficult Somewhat difficult -   .. GAD 7 : Generalized Anxiety Score 05/27/2020 10/22/2019 07/13/2019  Nervous, Anxious, on Edge 2 2 1   Control/stop worrying 1 2 1   Worry too much - different things 2 3 1   Trouble relaxing 1 1 1   Restless 1 1 0  Easily annoyed or irritable 1 1 0  Afraid - awful might happen 3 3 1   Total GAD 7 Score 11 13 5   Anxiety Difficulty - Not difficult at all Somewhat difficult         Assessment & Plan:  [name] KitchenMarland KitchenDoris was seen today for diabetes.  Diagnoses and all orders for this visit:  Type 2 diabetes mellitus with hyperglycemia, without long-term current use of insulin (HCC) -     POCT glycosylated hemoglobin (Hb A1C) -     COMPLETE METABOLIC PANEL WITH GFR  Chronic bronchitis, unspecified chronic bronchitis type (HCC) -     budesonide-formoterol (SYMBICORT) 160-4.5 MCG/ACT inhaler; Inhale 2 puffs into the lungs 2 (two) times  daily.  Essential hypertension -     losartan (COZAAR) 25 MG tablet; Take 1 tablet (25 mg total) by mouth daily. -     spironolactone (ALDACTONE) 25 MG tablet; TAKE 1 TABLET DAILY  Moderate episode of recurrent major depressive disorder (HCC) -     citalopram (CELEXA) 40 MG tablet; Take 1 tablet (40 mg total) by mouth daily.  Gastroesophageal reflux disease, unspecified whether esophagitis present -     famotidine (PEPCID) 20 MG tablet; Take 1 tablet (20 mg total) by mouth 2 (two) times daily.  Dyslipidemia, goal LDL below 70 -     Lipid Panel w/reflex Direct LDL -     rosuvastatin (CRESTOR) 5  MG tablet; Take 1 tablet (5 mg total) by mouth daily.  Medication management -     COMPLETE METABOLIC PANEL WITH GFR  Anemia due to vitamin B12 deficiency, unspecified B12 deficiency type -     B12 and Folate Panel -     cyanocobalamin ((VITAMIN B-12)) injection 1,000 mcg  Vitamin D deficiency -     VITAMIN D 25 Hydroxy (Vit-D Deficiency, Fractures) -     Vitamin D, Ergocalciferol, (DRISDOL) 1.25 MG (50000 UNIT) CAPS capsule; Take 1 capsule (50,000 Units total) by mouth once a week.  Chronic right shoulder pain -     HYDROcodone-acetaminophen (NORCO/VICODIN) 5-325 MG tablet; Take 1 tablet by mouth every 8 (eight) hours as needed for moderate pain.  Chronic pain of both knees -     HYDROcodone-acetaminophen (NORCO/VICODIN) 5-325 MG tablet; Take 1 tablet by mouth every 8 (eight) hours as needed for moderate pain.  Pain management contract agreement -     HYDROcodone-acetaminophen (NORCO/VICODIN) 5-325 MG tablet; Take 1 tablet by mouth every 8 (eight) hours as needed for moderate pain.  Flu vaccine need -     Flu Vaccine QUAD High Dose(Fluad)  Pernicious anemia -     cyanocobalamin (,VITAMIN B-12,) 1000 MCG/ML injection; Inject 1 mL (1,000 mcg total) into the muscle once for 1 dose.    Lab Results  Component Value Date   HGBA1C 6.1 (A) 09/05/2020   A1C was checked today at patients  request but followed by endocrinology. Stable. Continue on same medications.  Flu shot given today.  Vaccines UTD.  BP to goal.  LDL ordered today. On statin.   COPD-stable, continue on symbicort. Use albuterol only as needed.   OAB-catherized today keep follow up with urology.   She has not had her b12 shot in a while due to being in hospital and rehab. b12 1075mcg given today.   Ongoing chronic pain.  On contract.  ..PDMP not reviewed this encounter. Refilled for 3 months.   Mood-stable. Refilled celexa. See PHQ/GAD  Follow up in 3 months.

## 2020-09-06 LAB — LIPID PANEL W/REFLEX DIRECT LDL
Cholesterol: 207 mg/dL — ABNORMAL HIGH (ref <200)
HDL: 37 mg/dL — ABNORMAL LOW (ref >=50)
LDL Cholesterol (Calc): 123 mg/dL (calc) — ABNORMAL HIGH
Non-HDL Cholesterol (Calc): 170 mg/dL (calc) — ABNORMAL HIGH (ref <130)
Total CHOL/HDL Ratio: 5.6 (calc) — ABNORMAL HIGH (ref <5.0)
Triglycerides: 331 mg/dL — ABNORMAL HIGH (ref <150)

## 2020-09-06 LAB — COMPLETE METABOLIC PANEL WITH GFR
AG Ratio: 1.1 (calc) (ref 1.0–2.5)
ALT: 10 U/L (ref 6–29)
AST: 15 U/L (ref 10–35)
Albumin: 3.9 g/dL (ref 3.6–5.1)
Alkaline phosphatase (APISO): 96 U/L (ref 37–153)
BUN/Creatinine Ratio: 30 (calc) — ABNORMAL HIGH (ref 6–22)
BUN: 51 mg/dL — ABNORMAL HIGH (ref 7–25)
CO2: 26 mmol/L (ref 20–32)
Calcium: 9.8 mg/dL (ref 8.6–10.4)
Chloride: 96 mmol/L — ABNORMAL LOW (ref 98–110)
Creat: 1.7 mg/dL — ABNORMAL HIGH (ref 0.50–0.99)
GFR, Est African American: 35 mL/min/{1.73_m2} — ABNORMAL LOW (ref >=60)
GFR, Est Non African American: 30 mL/min/{1.73_m2} — ABNORMAL LOW (ref >=60)
Globulin: 3.5 g/dL (calc) (ref 1.9–3.7)
Glucose, Bld: 145 mg/dL — ABNORMAL HIGH (ref 65–99)
Potassium: 4.6 mmol/L (ref 3.5–5.3)
Sodium: 134 mmol/L — ABNORMAL LOW (ref 135–146)
Total Bilirubin: 0.4 mg/dL (ref 0.2–1.2)
Total Protein: 7.4 g/dL (ref 6.1–8.1)

## 2020-09-06 LAB — VITAMIN D 25 HYDROXY (VIT D DEFICIENCY, FRACTURES): Vit D, 25-Hydroxy: 32 ng/mL (ref 30–100)

## 2020-09-06 LAB — B12 AND FOLATE PANEL
Folate: 11.2 ng/mL
Vitamin B-12: >2000 pg/mL — ABNORMAL HIGH (ref 200–1100)

## 2020-09-08 MED ORDER — ROSUVASTATIN CALCIUM 5 MG PO TABS: 5.0000 mg | ORAL_TABLET | Freq: Every day | ORAL | 3 refills | Status: AC

## 2020-09-08 NOTE — Progress Notes (Signed)
[name],   GFR is 30 very close to going into stage 4 CKD. When is your next nephrologist appt?  LDL is not goal of under 70. I see you have tried louvastatin have you tried any more statins. Would you be willing to try to get to goal?  B12 is too high but you just had the shot.  Vitamin D is in goal range. Continue to take once weekly to keep above 30 and goal at 50.

## 2020-09-08 NOTE — Progress Notes (Signed)
Sent crestor.

## 2020-09-09 ENCOUNTER — Telehealth: Payer: Self-pay | Admitting: Physician Assistant

## 2020-09-09 NOTE — Telephone Encounter (Signed)
Patient called in stating she would like a copy of her blood work, did not state what date it was from. Would like someone to take it outside to her. No further information given.

## 2020-09-09 NOTE — Telephone Encounter (Signed)
Dom gave to patient.

## 2020-09-10 DIAGNOSIS — R5383 Other fatigue: Secondary | ICD-10-CM | POA: Diagnosis not present

## 2020-09-10 DIAGNOSIS — I129 Hypertensive chronic kidney disease with stage 1 through stage 4 chronic kidney disease, or unspecified chronic kidney disease: Secondary | ICD-10-CM | POA: Diagnosis not present

## 2020-09-10 DIAGNOSIS — D631 Anemia in chronic kidney disease: Secondary | ICD-10-CM | POA: Diagnosis not present

## 2020-09-10 DIAGNOSIS — R079 Chest pain, unspecified: Secondary | ICD-10-CM | POA: Diagnosis not present

## 2020-09-10 DIAGNOSIS — N2581 Secondary hyperparathyroidism of renal origin: Secondary | ICD-10-CM | POA: Diagnosis not present

## 2020-09-10 DIAGNOSIS — E1122 Type 2 diabetes mellitus with diabetic chronic kidney disease: Secondary | ICD-10-CM | POA: Diagnosis not present

## 2020-09-10 DIAGNOSIS — N1832 Chronic kidney disease, stage 3b: Secondary | ICD-10-CM | POA: Diagnosis not present

## 2020-09-10 DIAGNOSIS — Z794 Long term (current) use of insulin: Secondary | ICD-10-CM | POA: Diagnosis not present

## 2020-09-10 DIAGNOSIS — N179 Acute kidney failure, unspecified: Secondary | ICD-10-CM | POA: Diagnosis not present

## 2020-09-11 DIAGNOSIS — R079 Chest pain, unspecified: Secondary | ICD-10-CM | POA: Diagnosis not present

## 2020-09-19 ENCOUNTER — Other Ambulatory Visit: Payer: Self-pay | Admitting: Physician Assistant

## 2020-09-23 DIAGNOSIS — N3281 Overactive bladder: Secondary | ICD-10-CM | POA: Diagnosis not present

## 2020-09-23 DIAGNOSIS — Z466 Encounter for fitting and adjustment of urinary device: Secondary | ICD-10-CM | POA: Diagnosis not present

## 2020-09-26 DIAGNOSIS — R159 Full incontinence of feces: Secondary | ICD-10-CM | POA: Diagnosis not present

## 2020-09-26 DIAGNOSIS — M6259 Muscle wasting and atrophy, not elsewhere classified, multiple sites: Secondary | ICD-10-CM | POA: Diagnosis not present

## 2020-09-26 DIAGNOSIS — Z6841 Body Mass Index (BMI) 40.0 and over, adult: Secondary | ICD-10-CM | POA: Diagnosis not present

## 2020-09-26 DIAGNOSIS — E668 Other obesity: Secondary | ICD-10-CM | POA: Diagnosis not present

## 2020-09-29 ENCOUNTER — Other Ambulatory Visit: Payer: Self-pay | Admitting: Physician Assistant

## 2020-09-29 DIAGNOSIS — R600 Localized edema: Secondary | ICD-10-CM

## 2020-09-29 DIAGNOSIS — I10 Essential (primary) hypertension: Secondary | ICD-10-CM

## 2020-09-29 NOTE — Telephone Encounter (Signed)
Will you call patient and confirm what does she is on?

## 2020-09-29 NOTE — Telephone Encounter (Signed)
Unsure if this is still being managed by you? Looks like dosage has changed and unsure who is managing. Please advise.

## 2020-09-30 ENCOUNTER — Other Ambulatory Visit: Payer: Self-pay | Admitting: Neurology

## 2020-09-30 NOTE — Telephone Encounter (Signed)
Patient asking for refill of Xanax. Aware not due for refill until the end of the month. Please sign if appropriate. Dated for 10/16/2020.

## 2020-10-01 MED ORDER — ALPRAZOLAM 1 MG PO TABS: 1.0000 mg | ORAL_TABLET | Freq: Every day | ORAL | 2 refills | Status: DC

## 2020-10-07 ENCOUNTER — Other Ambulatory Visit: Payer: Self-pay

## 2020-10-07 DIAGNOSIS — Z0289 Encounter for other administrative examinations: Secondary | ICD-10-CM

## 2020-10-07 DIAGNOSIS — G8929 Other chronic pain: Secondary | ICD-10-CM

## 2020-10-07 DIAGNOSIS — M25562 Pain in left knee: Secondary | ICD-10-CM

## 2020-10-07 NOTE — Telephone Encounter (Signed)
Requesting RF on Norco  Last seen by PCP 09/05/20  Last RX sent 09/05/20  RF pended, please send if OK.   PCP out of office, sending to covering provider

## 2020-10-08 MED ORDER — HYDROCODONE-ACETAMINOPHEN 5-325 MG PO TABS: 1.0000 | ORAL_TABLET | Freq: Three times a day (TID) | ORAL | 0 refills | Status: DC | PRN

## 2020-10-10 DIAGNOSIS — E1122 Type 2 diabetes mellitus with diabetic chronic kidney disease: Secondary | ICD-10-CM | POA: Diagnosis not present

## 2020-10-10 DIAGNOSIS — N189 Chronic kidney disease, unspecified: Secondary | ICD-10-CM | POA: Diagnosis not present

## 2020-10-10 DIAGNOSIS — E1169 Type 2 diabetes mellitus with other specified complication: Secondary | ICD-10-CM | POA: Diagnosis not present

## 2020-10-10 DIAGNOSIS — Z79899 Other long term (current) drug therapy: Secondary | ICD-10-CM | POA: Diagnosis not present

## 2020-10-10 DIAGNOSIS — Z794 Long term (current) use of insulin: Secondary | ICD-10-CM | POA: Diagnosis not present

## 2020-10-11 ENCOUNTER — Other Ambulatory Visit: Payer: Self-pay | Admitting: Physician Assistant

## 2020-10-11 DIAGNOSIS — I10 Essential (primary) hypertension: Secondary | ICD-10-CM

## 2020-10-11 DIAGNOSIS — F331 Major depressive disorder, recurrent, moderate: Secondary | ICD-10-CM

## 2020-10-13 NOTE — Telephone Encounter (Signed)
Refill flagged due to review needed " Drug-Drug: spironolactone and potassium chloride SA"  Please review and send refills if OK

## 2020-10-14 MED ORDER — CITALOPRAM HYDROBROMIDE 40 MG PO TABS: 40.0000 mg | ORAL_TABLET | Freq: Every day | ORAL | 0 refills | Status: DC

## 2020-10-14 MED ORDER — LOSARTAN POTASSIUM 25 MG PO TABS: ORAL_TABLET | ORAL | 0 refills | Status: AC

## 2020-10-14 MED ORDER — SPIRONOLACTONE 25 MG PO TABS: ORAL_TABLET | ORAL | 0 refills | Status: DC

## 2020-10-14 NOTE — Telephone Encounter (Signed)
Last labs 9/10 and potassium stable.

## 2020-10-14 NOTE — Addendum Note (Signed)
Addended by: Towana Badger on: 10/14/2020 03:03 PM   Modules accepted: Orders

## 2020-10-17 ENCOUNTER — Other Ambulatory Visit: Payer: Self-pay | Admitting: Neurology

## 2020-10-17 DIAGNOSIS — F331 Major depressive disorder, recurrent, moderate: Secondary | ICD-10-CM

## 2020-10-17 MED ORDER — CITALOPRAM HYDROBROMIDE 40 MG PO TABS: 40.0000 mg | ORAL_TABLET | Freq: Every day | ORAL | 1 refills | Status: DC

## 2020-10-20 ENCOUNTER — Other Ambulatory Visit: Payer: Self-pay | Admitting: Neurology

## 2020-10-20 DIAGNOSIS — I10 Essential (primary) hypertension: Secondary | ICD-10-CM

## 2020-10-20 MED ORDER — SPIRONOLACTONE 25 MG PO TABS: ORAL_TABLET | ORAL | 0 refills | Status: DC

## 2020-10-27 ENCOUNTER — Telehealth: Payer: Self-pay

## 2020-10-27 NOTE — Telephone Encounter (Signed)
[name] called and left a message requesting a refill on B12. Not on current medication list. She wants it to go to the local pharmacy. Please advise.

## 2020-10-28 DIAGNOSIS — N3281 Overactive bladder: Secondary | ICD-10-CM | POA: Diagnosis not present

## 2020-10-28 DIAGNOSIS — Z466 Encounter for fitting and adjustment of urinary device: Secondary | ICD-10-CM | POA: Diagnosis not present

## 2020-10-28 DIAGNOSIS — N390 Urinary tract infection, site not specified: Secondary | ICD-10-CM | POA: Diagnosis not present

## 2020-10-29 NOTE — Telephone Encounter (Signed)
1 month ago B12 was too high so you would not qualify at this point for b12. We can recheck lab and if deficient we can come up with appropriate dosing and plan.

## 2020-10-29 NOTE — Telephone Encounter (Signed)
Patient advised. She states she will call back when she is ready to repeat the B12 labs.

## 2020-11-04 ENCOUNTER — Other Ambulatory Visit: Payer: Self-pay

## 2020-11-04 DIAGNOSIS — Z0289 Encounter for other administrative examinations: Secondary | ICD-10-CM

## 2020-11-04 DIAGNOSIS — G8929 Other chronic pain: Secondary | ICD-10-CM

## 2020-11-04 NOTE — Telephone Encounter (Signed)
It has only been 3 weeks. Contract states 75 tablets for 4 weeks. How many is patient using? Do we need to up date contract?

## 2020-11-04 NOTE — Telephone Encounter (Signed)
Patient advised. She never picked up the October prescription.

## 2020-11-04 NOTE — Telephone Encounter (Signed)
Called patient, [facility] for her to call back about medication.

## 2020-11-07 ENCOUNTER — Other Ambulatory Visit: Payer: Self-pay

## 2020-11-07 DIAGNOSIS — Z0289 Encounter for other administrative examinations: Secondary | ICD-10-CM

## 2020-11-07 DIAGNOSIS — G8929 Other chronic pain: Secondary | ICD-10-CM

## 2020-11-07 DIAGNOSIS — M25562 Pain in left knee: Secondary | ICD-10-CM

## 2020-11-07 MED ORDER — HYDROCODONE-ACETAMINOPHEN 5-325 MG PO TABS: 1.0000 | ORAL_TABLET | Freq: Three times a day (TID) | ORAL | 0 refills | Status: DC | PRN

## 2020-11-17 DIAGNOSIS — M2042 Other hammer toe(s) (acquired), left foot: Secondary | ICD-10-CM | POA: Diagnosis not present

## 2020-11-17 DIAGNOSIS — M2041 Other hammer toe(s) (acquired), right foot: Secondary | ICD-10-CM | POA: Diagnosis not present

## 2020-11-19 DIAGNOSIS — N390 Urinary tract infection, site not specified: Secondary | ICD-10-CM | POA: Diagnosis not present

## 2020-11-19 DIAGNOSIS — Z960 Presence of urogenital implants: Secondary | ICD-10-CM | POA: Diagnosis not present

## 2020-11-19 DIAGNOSIS — R8279 Other abnormal findings on microbiological examination of urine: Secondary | ICD-10-CM | POA: Diagnosis not present

## 2020-11-19 DIAGNOSIS — N3281 Overactive bladder: Secondary | ICD-10-CM | POA: Diagnosis not present

## 2020-11-19 DIAGNOSIS — Z466 Encounter for fitting and adjustment of urinary device: Secondary | ICD-10-CM | POA: Diagnosis not present

## 2020-11-22 ENCOUNTER — Other Ambulatory Visit: Payer: Self-pay | Admitting: Physician Assistant

## 2020-11-22 DIAGNOSIS — K219 Gastro-esophageal reflux disease without esophagitis: Secondary | ICD-10-CM

## 2020-11-28 ENCOUNTER — Ambulatory Visit: Payer: Medicare Other | Admitting: Physician Assistant

## 2020-11-28 DIAGNOSIS — E1165 Type 2 diabetes mellitus with hyperglycemia: Secondary | ICD-10-CM

## 2020-11-28 DIAGNOSIS — Z0289 Encounter for other administrative examinations: Secondary | ICD-10-CM

## 2020-11-28 DIAGNOSIS — G8929 Other chronic pain: Secondary | ICD-10-CM

## 2020-12-01 ENCOUNTER — Telehealth (INDEPENDENT_AMBULATORY_CARE_PROVIDER_SITE_OTHER): Payer: Medicare Other | Admitting: Physician Assistant

## 2020-12-01 ENCOUNTER — Ambulatory Visit: Payer: Medicare Other | Admitting: Physician Assistant

## 2020-12-01 ENCOUNTER — Encounter: Payer: Self-pay | Admitting: Physician Assistant

## 2020-12-01 VITALS — Temp 97.0°F

## 2020-12-01 DIAGNOSIS — J310 Chronic rhinitis: Secondary | ICD-10-CM

## 2020-12-01 DIAGNOSIS — Z978 Presence of other specified devices: Secondary | ICD-10-CM | POA: Diagnosis not present

## 2020-12-01 DIAGNOSIS — H9201 Otalgia, right ear: Secondary | ICD-10-CM

## 2020-12-01 DIAGNOSIS — N390 Urinary tract infection, site not specified: Secondary | ICD-10-CM | POA: Diagnosis not present

## 2020-12-01 DIAGNOSIS — N3945 Continuous leakage: Secondary | ICD-10-CM

## 2020-12-01 DIAGNOSIS — J019 Acute sinusitis, unspecified: Secondary | ICD-10-CM

## 2020-12-01 MED ORDER — SULFAMETHOXAZOLE-TRIMETHOPRIM 800-160 MG PO TABS: 1.0000 | ORAL_TABLET | Freq: Two times a day (BID) | ORAL | 0 refills | Status: AC

## 2020-12-01 MED ORDER — HYDROCORTISONE-ACETIC ACID 1-2 % OT SOLN: 5.0000 [drp] | Freq: Two times a day (BID) | OTIC | 0 refills | Status: AC

## 2020-12-01 MED ORDER — MUPIROCIN 2 % EX OINT: TOPICAL_OINTMENT | CUTANEOUS | 1 refills | Status: DC

## 2020-12-01 MED ORDER — IPRATROPIUM BROMIDE 0.06 % NA SOLN: 2.0000 | Freq: Four times a day (QID) | NASAL | 5 refills | Status: AC

## 2020-12-01 NOTE — Progress Notes (Deleted)
Patient has appt scheduled with Urology tomorrow to address catheter, told to contact them back to get appt information  She wanted to keep appt with [facility] because she has had right ear pain x 3 weeks She states pain traveling down to her neck   Wet tragus.   Also thinks she has UTI, but seeing urology tomorrow  Can take sulfa now.   Called   Flonase.   Bactroban.   levotyroin sinus and [facility]   Took bactrim before   entorococcus and MRSA but low colony counts.   [phone] of both not treated. Bactrim

## 2020-12-02 DIAGNOSIS — J310 Chronic rhinitis: Secondary | ICD-10-CM | POA: Insufficient documentation

## 2020-12-02 DIAGNOSIS — Z466 Encounter for fitting and adjustment of urinary device: Secondary | ICD-10-CM | POA: Diagnosis not present

## 2020-12-02 NOTE — Progress Notes (Signed)
Patient ID: [name], female   DOB: 1951-05-24, 69 y.o.   MRN: [id number] .[name] KitchenVirtual Visit via Telephone Note  I connected with [name] on 12/01/2020 at  2:40 PM EST by telephone and verified that I am speaking with the correct person using two identifiers.  Location: Patient: home Provider: clinic  .[name] KitchenParticipating in visit:  Patient: [name] Provider: Iran Planas PA-C Provider in training: Ola Spurr PA-Student    I discussed the limitations, risks, security and privacy concerns of performing an evaluation and management service by telephone and the availability of in person appointments. I also discussed with the patient that there may be a patient responsible charge related to this service. The patient expressed understanding and agreed to proceed.   History of Present Illness: Patient is a 69 year old female who is morbidly obese with very complex medical history who calls into the clinic urgent Foley catheter concerns. She sees Dr. Amalia Hailey and gets a new foley every month. Her last one was placed just before thanksgiving about 1 week ago. She noticed a few days after visit she had more leaking than usual. She is very concerned. She want to know "how to get this thing out over the phone".  She is having to sleep on check pad and with diapers.  She is still having to change her bed nightly.  She is having some discomfort with urination and low back pain.  She feels like this is the start of a urinary tract infection.  She did have her urine sample old before last Foley was placed.  She never heard anything about these results.  She does report she can take Bactrim now.  She previously had a documented sulfa allergy but has tried recently and tolerated.  She also mentions intermittent right ear pain for the last 3 weeks. No ear discharge, fever, chills, body aches. It is tender when she touches over tragus. Not tried anything to make better. Admits to sinus  pressure, congestion, runny nose, headache and ear popping. She is using flonase with minimal help.   Request refill of bactroban for prn usage for nasal application to reduce MRSA infection.   .. Active Ambulatory Problems    Diagnosis Date Noted  . History of pulmonary embolism 03/20/2014  . Type 2 diabetes mellitus with hyperglycemia, without long-term current use of insulin ([place]) 03/20/2014  . Morbidly obese ([place]) 03/20/2014  . Family history of heart disease 03/20/2014  . Bunion, right foot 03/20/2014  . Frequent headaches 07/20/2016  . Macular degeneration, dry 07/20/2016  . No energy 07/21/2016  . OSA on CPAP 07/21/2016  . Closed fracture of nasal bone 07/21/2016  . Hyperlipidemia 07/21/2016  . Depression 07/21/2016  . Gastroesophageal reflux disease without esophagitis 07/21/2016  . Facial pain 07/21/2016  . Chronic bronchitis ([place]) 07/21/2016  . Bilateral edema of lower extremity 07/21/2016  . B12 deficiency 07/21/2016  . Iron deficiency anemia 07/21/2016  . Anemia due to stage 3 chronic kidney disease ([place]) 08/04/2016  . Insomnia 08/04/2016  . Hypertriglyceridemia 08/06/2016  . Diabetic polyneuropathy associated with diabetes mellitus due to underlying condition ([place]) 11/07/2016  . Vitamin D deficiency 11/07/2016  . Chronic right shoulder pain 02/06/2017  . RLS (restless legs syndrome) 02/06/2017  . Essential hypertension 02/06/2017  . Left hand pain 02/06/2017  . Post-menopausal 02/06/2017  . Anxiety 02/06/2017  . Osteopenia 03/01/2017  . Aortic atherosclerosis ([place]) 03/16/2017  . Liver nodule 03/25/2017  . Chronic venous stasis dermatitis of both lower  extremities 03/25/2017  . Chronic pain of both knees 05/12/2017  . Pernicious anemia 08/29/2017  . Primary osteoarthritis involving multiple joints 11/25/2017  . Bilateral lower leg cellulitis 11/25/2017  . Abdominal pain, RLQ 11/25/2017  . Abdominal distension (gaseous) 11/25/2017  . Liver cirrhosis secondary  to NASH ([place]) 03/21/2017  . Elevated fasting glucose 02/27/2018  . Adenomatous colon polyp 02/28/2018  . Seborrheic dermatitis 06/04/2018  . Hammer toes of both feet 06/05/2018  . Left breast mass 06/05/2018  . Cellulitis of right leg 06/05/2018  . Elevated serum creatinine 07/02/2018  . Hypomagnesemia 09/15/2018  . Hyponatremia 09/15/2018  . Incontinence in female 03/25/2019  . Metatarsalgia of both feet 05/03/2019  . Ganglion cyst of dorsum of right wrist 05/03/2019  . Lymphedema 07/16/2019  . Chronic gastric ulcer without hemorrhage and without perforation 08/29/2019  . Gastritis 08/29/2019  . Foley catheter in place 12/01/2020  . Continuous leakage of urine 12/01/2020  . Recurrent UTI 12/01/2020   Resolved Ambulatory Problems    Diagnosis Date Noted  . Sweating profusely 08/29/2017  . Second degree burn of abdomen 02/27/2018  . Skin lesion 06/05/2018   Past Medical History:  Diagnosis Date  . Anemia   . Arthritis   . Cataracts, bilateral   . Chronic kidney disease   . Diabetes ([place])   . Family history of coronary artery disease   . Fatty liver   . GERD (gastroesophageal reflux disease)   . Headache   . Morbid obesity ([place])   . Peripheral vascular disease ([place])   . PONV (postoperative nausea and vomiting)   . Pulmonary embolism ([place]) 2001  . Restless leg syndrome   . Shortness of breath dyspnea   . Sleep apnea   . Venous stasis dermatitis of both lower extremities    Reviewed med, allergy, problem list.     Observations/Objective: No acute distress Pt is very worried about foley and placement.  Pt has some congested sounded voice and some throat clearing on telephone call.  She is tearful because of all the health needs and concerns she has.   .. Today's Vitals   12/01/20 1432  Temp: (!) 97 F (36.1 C)  TempSrc: Oral   There is no height or weight on file to calculate BMI.   Assessment and Plan: [name] KitchenMarland KitchenDoris was seen today for follow-up.  Diagnoses  and all orders for this visit:  Right ear pain -     acetic acid-hydrocortisone (VOSOL-HC) OTIC solution; Place 5 drops into the right ear 2 (two) times daily. For 7 days.  Foley catheter in place  Continuous leakage of urine -     sulfamethoxazole-trimethoprim (BACTRIM DS) 800-160 MG tablet; Take 1 tablet by mouth 2 (two) times daily.  Recurrent UTI -     sulfamethoxazole-trimethoprim (BACTRIM DS) 800-160 MG tablet; Take 1 tablet by mouth 2 (two) times daily.  Chronic rhinitis -     ipratropium (ATROVENT) 0.06 % nasal spray; Place 2 sprays into both nostrils 4 (four) times daily.  Acute non-recurrent sinusitis, unspecified location -     sulfamethoxazole-trimethoprim (BACTRIM DS) 800-160 MG tablet; Take 1 tablet by mouth 2 (two) times daily.  Other orders -     mupirocin ointment (BACTROBAN) 2 %; Apply to affected area TID for 7 days then as needed.   We called urology and patient was made appt for tomorrow. Discussed we cannot just "tell her how to remove the foley". Explained that it would not fall out as it has a balloon to  keep in place. They will evaluate tomorrow and see if removal and replacement is needed. I did look back and culture and sensitives. She did have a low colony count of 2 different bacteria's detected in last culture, MRSA and enterococcus. No treatment was initiated. Unclear if source was cathered collected but assuming so. She is starting to have more back pain and discomfort with urination and with leaking could represent an infection. Sounds like she also has some sinusitis and chronic rhinitis. Sent bactrim and atrovent. for right ear pain sent some acetic acid drops for inflammation. Some of her right ear pain likely represents ETD. Follow up with urology tomorrow.     Follow Up Instructions:    I discussed the assessment and treatment plan with the patient. The patient was provided an opportunity to ask questions and all were answered. The patient agreed  with the plan and demonstrated an understanding of the instructions.   The patient was advised to call back or seek an in-person evaluation if the symptoms worsen or if the condition fails to improve as anticipated.  I provided 30 minutes of non-face-to-face time during this encounter.   Iran Planas, PA-C

## 2020-12-03 MED ORDER — ACETIC ACID 2 % OT SOLN: 6.0000 [drp] | Freq: Two times a day (BID) | OTIC | 0 refills | Status: AC

## 2020-12-03 NOTE — Addendum Note (Signed)
Addended byAnnamaria Helling on: 12/03/2020 03:02 PM   Modules accepted: Orders

## 2020-12-05 ENCOUNTER — Other Ambulatory Visit: Payer: Self-pay | Admitting: Neurology

## 2020-12-05 DIAGNOSIS — G8929 Other chronic pain: Secondary | ICD-10-CM

## 2020-12-05 DIAGNOSIS — Z0289 Encounter for other administrative examinations: Secondary | ICD-10-CM

## 2020-12-05 MED ORDER — HYDROCODONE-ACETAMINOPHEN 5-325 MG PO TABS: 1.0000 | ORAL_TABLET | Freq: Three times a day (TID) | ORAL | 0 refills | Status: DC | PRN

## 2020-12-05 NOTE — Telephone Encounter (Signed)
Patient left vm to get pain medication refill. Due this weekend. Pended with date for tomorrow. Please sign.

## 2020-12-08 ENCOUNTER — Ambulatory Visit: Payer: Medicare Other | Admitting: Physician Assistant

## 2020-12-16 ENCOUNTER — Ambulatory Visit: Payer: Medicare Other | Admitting: Physician Assistant

## 2020-12-17 ENCOUNTER — Ambulatory Visit: Payer: Medicare Other | Admitting: Physician Assistant

## 2020-12-30 ENCOUNTER — Other Ambulatory Visit: Payer: Self-pay | Admitting: Physician Assistant

## 2020-12-30 DIAGNOSIS — F331 Major depressive disorder, recurrent, moderate: Secondary | ICD-10-CM

## 2020-12-30 MED ORDER — DULOXETINE HCL 60 MG PO CPEP: ORAL_CAPSULE | ORAL | 1 refills | Status: AC

## 2021-01-02 ENCOUNTER — Telehealth: Payer: Self-pay | Admitting: Physician Assistant

## 2021-01-02 DIAGNOSIS — G8929 Other chronic pain: Secondary | ICD-10-CM

## 2021-01-02 DIAGNOSIS — M79671 Pain in right foot: Secondary | ICD-10-CM

## 2021-01-02 DIAGNOSIS — M25562 Pain in left knee: Secondary | ICD-10-CM

## 2021-01-02 DIAGNOSIS — M79672 Pain in left foot: Secondary | ICD-10-CM

## 2021-01-02 MED ORDER — PREDNISONE 20 MG PO TABS: 20.0000 mg | ORAL_TABLET | Freq: Every day | ORAL | 0 refills | Status: DC

## 2021-01-02 NOTE — Telephone Encounter (Signed)
[name] called in around 55, saying that she can't walk very much, her feet hurt. She is asking if you can call her in some prednisone.

## 2021-01-02 NOTE — Telephone Encounter (Signed)
Sent 5 days to pharmacy

## 2021-01-05 ENCOUNTER — Telehealth: Payer: Self-pay | Admitting: Physician Assistant

## 2021-01-05 DIAGNOSIS — M25562 Pain in left knee: Secondary | ICD-10-CM

## 2021-01-05 DIAGNOSIS — M25511 Pain in right shoulder: Secondary | ICD-10-CM

## 2021-01-05 DIAGNOSIS — Z0289 Encounter for other administrative examinations: Secondary | ICD-10-CM

## 2021-01-05 DIAGNOSIS — G8929 Other chronic pain: Secondary | ICD-10-CM

## 2021-01-05 MED ORDER — HYDROCODONE-ACETAMINOPHEN 5-325 MG PO TABS: 1.0000 | ORAL_TABLET | Freq: Three times a day (TID) | ORAL | 0 refills | Status: DC | PRN

## 2021-01-05 NOTE — Telephone Encounter (Signed)
On pain contract. Due for refills. Sent norco.

## 2021-01-05 NOTE — Telephone Encounter (Signed)
LM on voicemail @ 9:57am to let patient know her medication had been called into the pharmacy.

## 2021-01-05 NOTE — Telephone Encounter (Signed)
Pt called. She wants a refill on her pain meds. She also wanted Jade to know that she fell out of her bed and hurt her leg(her sis bandaged her leg for her) and needs her  pain meds to help alleviate the pain.

## 2021-01-05 NOTE — Telephone Encounter (Signed)
Patient aware medication was called into the pharmacy.

## 2021-01-06 ENCOUNTER — Other Ambulatory Visit: Payer: Self-pay

## 2021-01-07 MED ORDER — ALPRAZOLAM 1 MG PO TABS: 1.0000 mg | ORAL_TABLET | Freq: Every day | ORAL | 2 refills | Status: DC

## 2021-01-10 ENCOUNTER — Other Ambulatory Visit: Payer: Self-pay | Admitting: Physician Assistant

## 2021-01-10 DIAGNOSIS — I10 Essential (primary) hypertension: Secondary | ICD-10-CM

## 2021-01-19 ENCOUNTER — Telehealth: Payer: Self-pay | Admitting: General Practice

## 2021-02-06 ENCOUNTER — Other Ambulatory Visit: Payer: Self-pay

## 2021-02-06 DIAGNOSIS — Z0289 Encounter for other administrative examinations: Secondary | ICD-10-CM

## 2021-02-06 DIAGNOSIS — M25562 Pain in left knee: Secondary | ICD-10-CM

## 2021-02-06 DIAGNOSIS — M159 Polyosteoarthritis, unspecified: Secondary | ICD-10-CM

## 2021-02-06 DIAGNOSIS — G8929 Other chronic pain: Secondary | ICD-10-CM

## 2021-02-06 DIAGNOSIS — M8949 Other hypertrophic osteoarthropathy, multiple sites: Secondary | ICD-10-CM

## 2021-02-06 MED ORDER — GABAPENTIN 100 MG PO CAPS: 100.0000 mg | ORAL_CAPSULE | Freq: Three times a day (TID) | ORAL | 5 refills | Status: AC

## 2021-02-06 MED ORDER — ALPRAZOLAM 1 MG PO TABS: 1.0000 mg | ORAL_TABLET | Freq: Every day | ORAL | 2 refills | Status: AC

## 2021-02-06 MED ORDER — HYDROCODONE-ACETAMINOPHEN 5-325 MG PO TABS: 1.0000 | ORAL_TABLET | Freq: Three times a day (TID) | ORAL | 0 refills | Status: DC | PRN

## 2021-02-10 ENCOUNTER — Other Ambulatory Visit: Payer: Self-pay | Admitting: Physician Assistant

## 2021-02-10 DIAGNOSIS — E559 Vitamin D deficiency, unspecified: Secondary | ICD-10-CM

## 2021-02-11 ENCOUNTER — Other Ambulatory Visit: Payer: Self-pay | Admitting: Neurology

## 2021-02-11 DIAGNOSIS — F331 Major depressive disorder, recurrent, moderate: Secondary | ICD-10-CM

## 2021-02-11 MED ORDER — CITALOPRAM HYDROBROMIDE 40 MG PO TABS: 40.0000 mg | ORAL_TABLET | Freq: Every day | ORAL | 1 refills | Status: AC

## 2021-02-11 NOTE — Telephone Encounter (Signed)
Per [name], she would like to start daily Prednisone 2.5 mg instead of doing bursts with tapers. LMOM for patient to call back to discuss.

## 2021-02-11 NOTE — Telephone Encounter (Signed)
Patient left vm asking for prednisone RX. Having pain in her feet and with upcoming rain she is worried this will get worse. Please advise.

## 2021-02-12 ENCOUNTER — Other Ambulatory Visit: Payer: Self-pay | Admitting: Physician Assistant

## 2021-02-12 ENCOUNTER — Other Ambulatory Visit: Payer: Self-pay | Admitting: Neurology

## 2021-02-12 MED ORDER — GLUCOSE BLOOD VI STRP: ORAL_STRIP | 99 refills | Status: AC

## 2021-02-13 NOTE — Telephone Encounter (Signed)
Documentation only.

## 2021-02-13 NOTE — Telephone Encounter (Signed)
I have left messages and not received a call back about it.

## 2021-02-13 NOTE — Telephone Encounter (Signed)
Did patient want to do the 2.5mg  daily prednisone?

## 2021-02-15 ENCOUNTER — Other Ambulatory Visit: Payer: Self-pay | Admitting: Physician Assistant

## 2021-02-15 DIAGNOSIS — K219 Gastro-esophageal reflux disease without esophagitis: Secondary | ICD-10-CM

## 2021-02-16 ENCOUNTER — Other Ambulatory Visit: Payer: Self-pay | Admitting: *Deleted

## 2021-02-16 NOTE — Telephone Encounter (Signed)
Pt is requesting another refill of Prednisone.  She states that she is unable to evan walk and is in excruciating pain.  Please advise.

## 2021-02-17 ENCOUNTER — Telehealth: Payer: Self-pay

## 2021-02-17 MED ORDER — PREDNISONE 20 MG PO TABS: 20.0000 mg | ORAL_TABLET | Freq: Every day | ORAL | 0 refills | Status: AC

## 2021-02-17 NOTE — Telephone Encounter (Signed)
It was sent. She needs to consider doing daily small quanities as to not keep doing these burst of prednisone. We can talk about this if she can schedule appt.

## 2021-02-17 NOTE — Telephone Encounter (Signed)
[name] called and left a message stating she is having pain and is unable to walk. She is wanting a round of prednisone. Please advise.

## 2021-02-17 NOTE — Telephone Encounter (Signed)
Patient advised.

## 2021-02-27 ENCOUNTER — Telehealth: Payer: Self-pay

## 2021-02-27 NOTE — Telephone Encounter (Signed)
[name] is over due for an appointment and she wants an early refill. She is very upset because it is going to rain next week and she will be in a lot of pain. She states she takes Hydrocodone every 8 hours. She states she needs # 90 for a 30 days supply. Please advise.   She has scheduled an appointment for 03/24/2021.

## 2021-03-02 NOTE — Telephone Encounter (Signed)
Tried to call, went straight to vm.  LMOM for patient to call and move appt sooner. Need to know if she is taking Xanax. Needs to discuss with [name] about the amount of pain medication she is taking as contract states only 75.

## 2021-03-02 NOTE — Telephone Encounter (Signed)
Her PMDP is not accurate. Who do we contact about this? I can't tell when the last time controlled meds are filled and who gives them to her.   Pt cannot continue to increase her pain medication. I do not like to give more than twice a day before sending to pain clinic. You are also taking xanax right? How often? We need to discontinue xanax if going to take pain medication this often. Can she do a virtual sooner. I want to make sure she understands all of this.

## 2021-03-02 NOTE — Telephone Encounter (Signed)
[name] is out of the office and there was 2 more Voicemails from this patient on Friday at 4:48 inquiring about her pain meds. Can you please call her back and make Patient understand what [name] is saying? Her call back number that she left on the voicemail was [phone]

## 2021-03-04 ENCOUNTER — Telehealth: Payer: Self-pay

## 2021-03-05 ENCOUNTER — Other Ambulatory Visit: Payer: Self-pay

## 2021-03-05 DIAGNOSIS — Z0289 Encounter for other administrative examinations: Secondary | ICD-10-CM

## 2021-03-05 DIAGNOSIS — M25562 Pain in left knee: Secondary | ICD-10-CM

## 2021-03-05 DIAGNOSIS — G8929 Other chronic pain: Secondary | ICD-10-CM

## 2021-03-05 NOTE — Telephone Encounter (Signed)
[name] has an appointment for later this month. She is requesting refill. Please advise.

## 2021-03-05 NOTE — Telephone Encounter (Signed)
[name] wanted her to have an appointment for additional refills.  So this one will be denied.

## 2021-03-06 MED ORDER — HYDROCODONE-ACETAMINOPHEN 5-325 MG PO TABS: 1.0000 | ORAL_TABLET | Freq: Three times a day (TID) | ORAL | 0 refills | Status: DC | PRN

## 2021-03-06 NOTE — Telephone Encounter (Signed)
Pharmacy called and states Hydrocodone on back order. Called patient to let her know and see what pharmacy she wants sent to instead of [facility] [address] LMOM for patient to call back.

## 2021-03-09 ENCOUNTER — Other Ambulatory Visit: Payer: Self-pay | Admitting: Physician Assistant

## 2021-03-09 ENCOUNTER — Ambulatory Visit (INDEPENDENT_AMBULATORY_CARE_PROVIDER_SITE_OTHER): Payer: Medicare Other | Admitting: Physician Assistant

## 2021-03-09 ENCOUNTER — Telehealth: Payer: Self-pay | Admitting: Neurology

## 2021-03-09 DIAGNOSIS — M25561 Pain in right knee: Secondary | ICD-10-CM

## 2021-03-09 DIAGNOSIS — Z5329 Procedure and treatment not carried out because of patient's decision for other reasons: Secondary | ICD-10-CM

## 2021-03-09 DIAGNOSIS — G8929 Other chronic pain: Secondary | ICD-10-CM

## 2021-03-09 DIAGNOSIS — Z0289 Encounter for other administrative examinations: Secondary | ICD-10-CM

## 2021-03-09 MED ORDER — HYDROCODONE-ACETAMINOPHEN 5-325 MG PO TABS: 1.0000 | ORAL_TABLET | Freq: Three times a day (TID) | ORAL | 0 refills | Status: AC | PRN

## 2021-03-09 NOTE — Telephone Encounter (Signed)
Ok to cancel backorder. I will send this months patient will need to establish with another provider to get future pain medication.

## 2021-03-09 NOTE — Progress Notes (Signed)
No show.  Must have appt before any more pain pills are prescribed.

## 2021-03-09 NOTE — Telephone Encounter (Signed)
Patient has cancelled multiple appointments, has not complied with medical advise on medications, and has used derisive language with multiple staff members. Per [name], to discharge patient. Letter written.

## 2021-03-09 NOTE — Telephone Encounter (Signed)
Called and LMOM letting patient know [name] is not going to write any pain medication refills until she is seen in the office.

## 2021-03-09 NOTE — Telephone Encounter (Signed)
Agree with above plan. 

## 2021-03-09 NOTE — Telephone Encounter (Signed)
[facility]  Pended hydrocodone for [facility]. [facility]. Please advise if you want to send and I can cancel the [facility].

## 2021-03-09 NOTE — Telephone Encounter (Signed)
Pt states that she called at 1:00p.m.today  to cancel her appointment:she has sores on her legs that's making walking  diffcult. She wants script for Hydrocodone rewritten to [facility] in [place];  Thank you

## 2021-03-09 NOTE — Telephone Encounter (Signed)
Sent fax to [facility].

## 2021-03-09 NOTE — Addendum Note (Signed)
Addended byAnnamaria Helling on: 03/09/2021 03:43 PM   Modules accepted: Orders

## 2021-03-10 ENCOUNTER — Telehealth (INDEPENDENT_AMBULATORY_CARE_PROVIDER_SITE_OTHER): Payer: Medicare Other | Admitting: Physician Assistant

## 2021-03-10 ENCOUNTER — Encounter: Payer: Self-pay | Admitting: Physician Assistant

## 2021-03-10 DIAGNOSIS — Z79899 Other long term (current) drug therapy: Secondary | ICD-10-CM

## 2021-03-10 DIAGNOSIS — E1165 Type 2 diabetes mellitus with hyperglycemia: Secondary | ICD-10-CM

## 2021-03-10 DIAGNOSIS — L89309 Pressure ulcer of unspecified buttock, unspecified stage: Secondary | ICD-10-CM | POA: Diagnosis not present

## 2021-03-10 MED ORDER — SILVER SULFADIAZINE 1 % EX CREA: 1.0000 "application " | TOPICAL_CREAM | Freq: Every day | CUTANEOUS | 0 refills | Status: AC

## 2021-03-10 MED ORDER — CEPHALEXIN 500 MG PO CAPS: 500.0000 mg | ORAL_CAPSULE | Freq: Two times a day (BID) | ORAL | 0 refills | Status: AC

## 2021-03-10 NOTE — Progress Notes (Signed)
Patient ID: [name], female   DOB: 08-Mar-1951, 70 y.o.   MRN: [id number] .[name] KitchenVirtual Visit via Telephone Note  I connected with [name] on 03/13/21 at  3:00 PM EDT by telephone and verified that I am speaking with the correct person using two identifiers.  Location: Patient: home Provider: clinic  .[name] KitchenParticipating in visit:  Patient: [name] Provider: Iran Planas PA-C   I discussed the limitations, risks, security and privacy concerns of performing an evaluation and management service by telephone and the availability of in person appointments. I also discussed with the patient that there may be a patient responsible charge related to this service. The patient expressed understanding and agreed to proceed.   History of Present Illness: Pt is a 70 yo obese female with T2DM, HTN, Aortic atherosclerosis, OSA, GERD, chronic pain who struggles with ambulation and mostly sedentary who calls into the clinic with "sores on bottom and legs". She is noticing the worse one is the right buttock. She also has one back of both legs and left buttock. She is putting antibiotic ointment on them. They are painful. No itching. They look like a blister and do drain some. No fever, chills, body aches other than her ongoing aching.   She would like A1C to take to endocrinology.   .. Active Ambulatory Problems    Diagnosis Date Noted  . History of pulmonary embolism 03/20/2014  . Type 2 diabetes mellitus with hyperglycemia, without long-term current use of insulin ([place]) 03/20/2014  . Morbidly obese ([place]) 03/20/2014  . Family history of heart disease 03/20/2014  . Bunion, right foot 03/20/2014  . Frequent headaches 07/20/2016  . Macular degeneration, dry 07/20/2016  . No energy 07/21/2016  . OSA on CPAP 07/21/2016  . Closed fracture of nasal bone 07/21/2016  . Hyperlipidemia 07/21/2016  . Depression 07/21/2016  . Gastroesophageal reflux disease without esophagitis 07/21/2016  . Facial  pain 07/21/2016  . Chronic bronchitis ([place]) 07/21/2016  . Bilateral edema of lower extremity 07/21/2016  . B12 deficiency 07/21/2016  . Iron deficiency anemia 07/21/2016  . Anemia due to stage 3 chronic kidney disease ([place]) 08/04/2016  . Insomnia 08/04/2016  . Hypertriglyceridemia 08/06/2016  . Diabetic polyneuropathy associated with diabetes mellitus due to underlying condition ([place]) 11/07/2016  . Vitamin D deficiency 11/07/2016  . Chronic right shoulder pain 02/06/2017  . RLS (restless legs syndrome) 02/06/2017  . Essential hypertension 02/06/2017  . Left hand pain 02/06/2017  . Post-menopausal 02/06/2017  . Anxiety 02/06/2017  . Osteopenia 03/01/2017  . Aortic atherosclerosis ([place]) 03/16/2017  . Liver nodule 03/25/2017  . Chronic venous stasis dermatitis of both lower extremities 03/25/2017  . Chronic pain of both knees 05/12/2017  . Pernicious anemia 08/29/2017  . Primary osteoarthritis involving multiple joints 11/25/2017  . Bilateral lower leg cellulitis 11/25/2017  . Abdominal pain, RLQ 11/25/2017  . Abdominal distension (gaseous) 11/25/2017  . Liver cirrhosis secondary to NASH ([place]) 03/21/2017  . Elevated fasting glucose 02/27/2018  . Adenomatous colon polyp 02/28/2018  . Seborrheic dermatitis 06/04/2018  . Hammer toes of both feet 06/05/2018  . Left breast mass 06/05/2018  . Cellulitis of right leg 06/05/2018  . Elevated serum creatinine 07/02/2018  . Hypomagnesemia 09/15/2018  . Hyponatremia 09/15/2018  . Incontinence in female 03/25/2019  . Metatarsalgia of both feet 05/03/2019  . Ganglion cyst of dorsum of right wrist 05/03/2019  . Lymphedema 07/16/2019  . Chronic gastric ulcer without hemorrhage and without perforation 08/29/2019  . Gastritis 08/29/2019  . Foley catheter  in place 12/01/2020  . Continuous leakage of urine 12/01/2020  . Recurrent UTI 12/01/2020  . Chronic rhinitis 12/02/2020  . Pressure injury of skin of buttock 03/10/2021   Resolved  Ambulatory Problems    Diagnosis Date Noted  . Sweating profusely 08/29/2017  . Second degree burn of abdomen 02/27/2018  . Skin lesion 06/05/2018   Past Medical History:  Diagnosis Date  . Anemia   . Arthritis   . Cataracts, bilateral   . Chronic kidney disease   . Diabetes ([place])   . Family history of coronary artery disease   . Fatty liver   . GERD (gastroesophageal reflux disease)   . Headache   . Morbid obesity ([place])   . Peripheral vascular disease ([place])   . PONV (postoperative nausea and vomiting)   . Pulmonary embolism ([place]) 2001  . Restless leg syndrome   . Shortness of breath dyspnea   . Sleep apnea   . Venous stasis dermatitis of both lower extremities    Reviewed med, allergy, problem list.    Observations/Objective: No acute distress Normal breathing. No coughing.   Not able to see sores due to telephone call.    Assessment and Plan: [name] KitchenMarland KitchenDoris was seen today for follow-up.  Diagnoses and all orders for this visit:  Pressure injury of skin of buttock, unspecified injury stage, unspecified laterality -     cephALEXin (KEFLEX) 500 MG capsule; Take 1 capsule (500 mg total) by mouth 2 (two) times daily. For 7 days. -     silver sulfADIAZINE (SILVADENE) 1 % cream; Apply 1 application topically daily. -     COMPLETE METABOLIC PANEL WITH GFR -     CBC with Differential/Platelet  Type 2 diabetes mellitus with hyperglycemia, without long-term current use of insulin (HCC) -     Hemoglobin A1c  Medication management -     Hemoglobin A1c -     COMPLETE METABOLIC PANEL WITH GFR -     CBC with Differential/Platelet   Discussed likely pressure ulcer from sitting or laying most of the day. Suggest duoderm OTC to place over each wound. Can use as needed silvadine creams. Course of kelfex given due to blister draining and becoming more painful. Keep area clean. Change position multiple times a day. If not improving need to "see" sores for better treatment.   Follow Up  Instructions:    I discussed the assessment and treatment plan with the patient. The patient was provided an opportunity to ask questions and all were answered. The patient agreed with the plan and demonstrated an understanding of the instructions.   The patient was advised to call back or seek an in-person evaluation if the symptoms worsen or if the condition fails to improve as anticipated.  I provided 15 minutes of non-face-to-face time during this encounter.   Iran Planas, PA-C

## 2021-03-10 NOTE — Patient Instructions (Signed)
duoderm pads for extra protection.

## 2021-03-13 ENCOUNTER — Encounter: Payer: Self-pay | Admitting: Physician Assistant

## 2021-03-17 ENCOUNTER — Telehealth: Payer: Self-pay

## 2021-03-17 NOTE — Telephone Encounter (Signed)
[name] called and states she has mouth sores. She would like to magic mouthwash. Please advise.

## 2021-03-18 MED ORDER — AMBULATORY NON FORMULARY MEDICATION: 0 refills | Status: AC

## 2021-03-18 NOTE — Telephone Encounter (Signed)
Do we still have the magic mouthwash formula?

## 2021-03-18 NOTE — Telephone Encounter (Signed)
Printed and sent  

## 2021-03-18 NOTE — Telephone Encounter (Signed)
Faxed to [facility] pharmacy.  

## 2021-03-18 NOTE — Telephone Encounter (Signed)
Pended.

## 2021-03-24 ENCOUNTER — Ambulatory Visit: Payer: Medicare Other | Admitting: Physician Assistant

## 2021-04-02 ENCOUNTER — Other Ambulatory Visit: Payer: Self-pay | Admitting: Physician Assistant

## 2021-04-06 ENCOUNTER — Other Ambulatory Visit: Payer: Self-pay | Admitting: Physician Assistant

## 2021-05-12 ENCOUNTER — Ambulatory Visit (HOSPITAL_COMMUNITY): Admission: RE | Admit: 2021-05-12 | Payer: Medicare Other | Source: Ambulatory Visit

## 2021-05-14 ENCOUNTER — Ambulatory Visit (HOSPITAL_COMMUNITY): Payer: Medicare Other

## 2021-05-14 ENCOUNTER — Other Ambulatory Visit (HOSPITAL_COMMUNITY): Payer: Self-pay | Admitting: Orthopaedic Surgery

## 2021-05-14 DIAGNOSIS — R600 Localized edema: Secondary | ICD-10-CM

## 2021-05-15 ENCOUNTER — Other Ambulatory Visit: Payer: Self-pay

## 2021-05-15 ENCOUNTER — Ambulatory Visit (HOSPITAL_COMMUNITY)
Admission: RE | Admit: 2021-05-15 | Discharge: 2021-05-15 | Disposition: A | Discharge status: Home / Self Care | Payer: Medicare Other | Source: Ambulatory Visit | Attending: Orthopaedic Surgery | Admitting: Orthopaedic Surgery

## 2021-05-15 DIAGNOSIS — R600 Localized edema: Secondary | ICD-10-CM

## 2021-05-16 ENCOUNTER — Other Ambulatory Visit: Payer: Self-pay | Admitting: Physician Assistant

## 2021-05-16 DIAGNOSIS — K219 Gastro-esophageal reflux disease without esophagitis: Secondary | ICD-10-CM

## 2021-06-12 ENCOUNTER — Other Ambulatory Visit: Payer: Self-pay | Admitting: Orthopaedic Surgery

## 2021-06-16 ENCOUNTER — Encounter (HOSPITAL_COMMUNITY)
Admission: RE | Admit: 2021-06-16 | Discharge: 2021-06-16 | Disposition: A | Discharge status: Home / Self Care | Payer: Medicare Other | Source: Ambulatory Visit | Attending: Orthopaedic Surgery | Admitting: Orthopaedic Surgery

## 2021-06-16 ENCOUNTER — Encounter (HOSPITAL_COMMUNITY): Payer: Self-pay

## 2021-06-16 ENCOUNTER — Encounter (HOSPITAL_COMMUNITY): Payer: Self-pay | Admitting: Vascular Surgery

## 2021-06-16 ENCOUNTER — Other Ambulatory Visit: Payer: Self-pay

## 2021-06-16 DIAGNOSIS — Z01812 Encounter for preprocedural laboratory examination: Secondary | ICD-10-CM | POA: Diagnosis present

## 2021-06-16 HISTORY — DX: Cardiac murmur, unspecified: R01.1

## 2021-06-16 LAB — HEMOGLOBIN A1C
Hgb A1c MFr Bld: 6.3 % — ABNORMAL HIGH (ref 4.8–5.6)
Mean Plasma Glucose: 134.11 mg/dL

## 2021-06-16 LAB — GLUCOSE, CAPILLARY: Glucose-Capillary: 97 mg/dL (ref 70–99)

## 2021-06-16 NOTE — Progress Notes (Addendum)
Surgical Instructions    Your procedure is scheduled on Thursday 06/25/2021.  Report to [facility] Main Entrance "A" at 5:30 A.M., then check in with the Admitting office.  Call this number if you have problems the morning of surgery:  [phone]   If you have any questions prior to your surgery date call [phone]: Open Monday-Friday 8am-4pm    Remember:  Do not eat after midnight the night before your surgery  You may drink clear liquids until 0445 the morning of your surgery.   Clear liquids allowed are: Water, Non-Citrus Juices (without pulp), Carbonated Beverages, Clear Tea, Black Coffee Only, and Gatorade    Take these medicines the morning of surgery with A SIP OF WATER:              albuterol (PROVENTIL HFA;VENTOLIN HFA) if needed              budesonide-formoterol (SYMBICORT) if neede                      WHAT DO I DO ABOUT MY DIABETES MEDICATION?  The night before surgery- take 1/2 usual dose of TRESIBA (8 units).  Do not take any Novolog insulin at bedtime the night before surgery    The morning of surgery ifyour CBG is greater than 220 mg/dL, you may take  of your sliding scale (correction) dose of insulin( NOVOLOG )                HOW TO MANAGE YOUR DIABETES BEFORE AND AFTER SURGERY  Why is it important to control my blood sugar before and after surgery? Improving blood sugar levels before and after surgery helps healing and can limit problems. A way of improving blood sugar control is eating a healthy diet by:  Eating less sugar and carbohydrates  Increasing activity/exercise  Talking with your doctor about reaching your blood sugar goals High blood sugars (greater than 180 mg/dL) can raise your risk of infections and slow your recovery, so you will need to focus on controlling your diabetes during the weeks before surgery. Make sure that the doctor who takes care of your diabetes knows about your planned surgery including the date and location.  How do  I manage my blood sugar before surgery? Check your blood sugar at least 4 times a day, starting 2 days before surgery, to make sure that the level is not too high or low.  Check your blood sugar the morning of your surgery when you wake up and every 2 hours until you get to the Short Stay unit.  If your blood sugar is less than 70 mg/dL, you will need to treat for low blood sugar: Do not take insulin. Treat a low blood sugar (less than 70 mg/dL) with  cup of clear juice (cranberry or apple), 4 glucose tablets, OR glucose gel. Recheck blood sugar in 15 minutes after treatment (to make sure it is greater than 70 mg/dL). If your blood sugar is not greater than 70 mg/dL on recheck, call [phone] for further instructions. Report your blood sugar to the short stay nurse when you get to Short Stay.  If you are admitted to the hospital after surgery: Your blood sugar will be checked by the staff and you will probably be given insulin after surgery (instead of oral diabetes medicines) to make sure you have good blood sugar levels. The goal for blood sugar control after surgery is 80-180 mg/dL.  As of today, STOP taking  any Aspirin (unless otherwise instructed by your surgeon) Aleve, Naproxen, Ibuprofen, Motrin, Advil, Goody's, BC's, all herbal medications, fish oil, and all vitamins.          Do not wear jewelry or makeup Do not wear lotions, powders, perfumes/colognes, or deodorant. Do not shave 48 hours prior to surgery.  Men may shave face and neck. Do not bring valuables to the hospital. DO Not wear nail polish, gel polish, artificial nails, or any other type of covering on  natural nails including finger and toenails. If patients have artificial nails, gel coating, etc. that need to be removed by a nail salon please have this removed prior to surgery or surgery may need to be canceled/delayed if the surgeon/ anesthesia feels like the patient is unable to be adequately monitored.              [facility] is not responsible for any belongings or valuables.   Do NOT Smoke (Tobacco/Vaping) or drink Alcohol 24 hours prior to your procedure If you use a CPAP at night, you may bring all equipment for your overnight stay.   Contacts, glasses, dentures or bridgework may not be worn into surgery, please bring cases for these belongings   For patients admitted to the hospital, discharge time will be determined by your treatment team.   Patients discharged the day of surgery will not be allowed to drive home, and someone needs to stay with them for 24 hours.  ONLY 1 SUPPORT PERSON MAY BE PRESENT WHILE YOU ARE IN SURGERY. IF YOU ARE TO BE ADMITTED ONCE YOU ARE IN YOUR ROOM YOU WILL BE ALLOWED TWO (2) VISITORS.  Minor children may have two parents present. Special consideration for safety and communication needs will be reviewed on a case by case basis.  Special instructions:    Oral Hygiene is also important to reduce your risk of infection.  Remember - BRUSH YOUR TEETH THE MORNING OF SURGERY WITH YOUR REGULAR TOOTHPASTE   [place] [facility]- Preparing For Surgery  Before surgery, you can play an important role. Because skin is not sterile, your skin needs to be as free of germs as possible. You can reduce the number of germs on your skin by washing with CHG (chlorahexidine gluconate) Soap before surgery.  CHG is an antiseptic cleaner which kills germs and bonds with the skin to continue killing germs even after washing.     Please do not use if you have an allergy to CHG or antibacterial soaps. If your skin becomes reddened/irritated stop using the CHG.  Do not shave (including legs and underarms) for at least 48 hours prior to first CHG shower. It is OK to shave your face.  Please follow these instructions carefully.     Shower the NIGHT BEFORE SURGERY and the MORNING OF SURGERY with CHG Soap.   If you chose to wash your hair, wash your hair first as usual with your normal shampoo. After  you shampoo, rinse your hair and body thoroughly to remove the shampoo.  Then [employer] and genitals (private parts) with your normal soap and rinse thoroughly to remove soap.  After that Use CHG Soap as you would any other liquid soap. You can apply CHG directly to the skin and wash gently with a scrungie or a clean washcloth.   Apply the CHG Soap to your body ONLY FROM THE NECK DOWN.  Do not use on open wounds or open sores. Avoid contact with your eyes, ears, mouth and genitals (  private parts). Wash Face and genitals (private parts)  with your normal soap.   Wash thoroughly, paying special attention to the area where your surgery will be performed.  Thoroughly rinse your body with warm water from the neck down.  DO NOT shower/wash with your normal soap after using and rinsing off the CHG Soap.  Pat yourself dry with a CLEAN TOWEL.  Wear CLEAN PAJAMAS to bed the night before surgery  Place CLEAN SHEETS on your bed the night before your surgery  DO NOT SLEEP WITH PETS.   Day of Surgery:  Take a shower with CHG soap. Wear Clean/Comfortable clothing the morning of surgery Do not apply any deodorants/lotions.   Remember to brush your teeth WITH YOUR REGULAR TOOTHPASTE.   Please read over the following fact sheets that you were given.

## 2021-06-16 NOTE — Progress Notes (Addendum)
PCP - Devin Going MD,[facility]  Cardiologist - Dorina Hoyer MD  PPM/ICD - denies Device Orders -  Rep Notified -   Chest x-ray - 06/14/21 EKG - 09/11/20 Stress Test - 02/07/17 ECHO - none Cardiac Cath - none  Sleep Study - "years ago". Unable to find in Care Everywhere of Chart review.  CPAP - has one does not wear  Fasting Blood Sugar - 100-120 Checks Blood Sugar _one_ times a day  Blood Thinner Instructions:n/a Aspirin Instructions:n/a  ERAS Protcol -clear liquids until 0445 DOS PRE-SURGERY Ensure or G2- water  COVID TEST- n/a;pt is posted as ambulatory surgery.    Anesthesia review:yes -Hgb 9.5  Patient denies shortness of breath, fever, cough and chest pain at PAT appointment   All instructions explained to the patient, with a verbal understanding of the material. Patient agrees to go over the instructions while at home for a better understanding. Patient also instructed to self quarantine after being tested for COVID-19. The opportunity to ask questions was provided.

## 2021-06-17 NOTE — Anesthesia Preprocedure Evaluation (Deleted)
Anesthesia Evaluation    Airway        Dental   Pulmonary           Cardiovascular      Neuro/Psych    GI/Hepatic   Endo/Other  diabetes  Renal/GU      Musculoskeletal   Abdominal   Peds  Hematology   Anesthesia Other Findings   Reproductive/Obstetrics                             Anesthesia Physical Anesthesia Plan  ASA:   Anesthesia Plan:    Post-op Pain Management:    Induction:   PONV Risk Score and Plan:   Airway Management Planned:   Additional Equipment:   Intra-op Plan:   Post-operative Plan:   Informed Consent:   Plan Discussed with:   Anesthesia Plan Comments: (PAT note by Karoline Caldwell, PA-C:  CBC and CMP 06/14/21 in [place] notable for Hgb 9.5 and creatinine 1.37. Pt has chronic anemia. Recent baseline hgb appears to be ~9.5-10  Nuclear stress 12/16/17 (care everywhere): No definite evidence of inducible transmural ischemia or transmural scar.  Normal left ventricular ejection fraction and regional wall motion.  TTE 12/15/17 (care everywhere): SUMMARY  The left ventricular size is normal with normal left ventricular wall  thickness.  The left ventricle is hyperdynamic; LVEF = >70%. Left ventricular filling  pattern is prolonged relaxation.  The right ventricle is normal in size and function.  There is no significant valvular stenosis or regurgitation.  Compared to the prior study dated 03/16/2011, there is probably no significant  change.  )       Anesthesia Quick Evaluation

## 2021-06-25 ENCOUNTER — Ambulatory Visit (HOSPITAL_COMMUNITY): Admission: RE | Admit: 2021-06-25 | Payer: Medicare Other | Source: Home / Self Care | Admitting: Orthopaedic Surgery

## 2021-06-25 ENCOUNTER — Encounter (HOSPITAL_COMMUNITY): Admission: RE | Payer: Self-pay | Source: Home / Self Care

## 2021-06-25 SURGERY — FUSION, JOINT, GREAT TOE
Anesthesia: Choice | Site: Toe | Laterality: Right

## 2021-07-04 ENCOUNTER — Other Ambulatory Visit: Payer: Self-pay | Admitting: Physician Assistant

## 2021-07-04 DIAGNOSIS — F331 Major depressive disorder, recurrent, moderate: Secondary | ICD-10-CM

## 2021-11-22 ENCOUNTER — Other Ambulatory Visit: Payer: Self-pay | Admitting: Physician Assistant

## 2021-11-22 DIAGNOSIS — F331 Major depressive disorder, recurrent, moderate: Secondary | ICD-10-CM

## 2022-09-27 ENCOUNTER — Inpatient Hospital Stay
Admit: 2022-09-27 | Discharge: 2022-12-01 | Disposition: A | Discharge status: Skilled Nursing Facility | Payer: Medicare Other | Source: Other Acute Inpatient Hospital

## 2022-09-27 ENCOUNTER — Other Ambulatory Visit (HOSPITAL_COMMUNITY): Payer: Medicare Other

## 2022-09-27 DIAGNOSIS — J449 Chronic obstructive pulmonary disease, unspecified: Secondary | ICD-10-CM

## 2022-09-27 DIAGNOSIS — Z930 Tracheostomy status: Secondary | ICD-10-CM

## 2022-09-27 DIAGNOSIS — G9341 Metabolic encephalopathy: Secondary | ICD-10-CM

## 2022-09-27 DIAGNOSIS — G4733 Obstructive sleep apnea (adult) (pediatric): Secondary | ICD-10-CM | POA: Insufficient documentation

## 2022-09-27 DIAGNOSIS — J9621 Acute and chronic respiratory failure with hypoxia: Secondary | ICD-10-CM

## 2022-09-27 DIAGNOSIS — J189 Pneumonia, unspecified organism: Secondary | ICD-10-CM

## 2022-09-27 LAB — BLOOD GAS, ARTERIAL
Acid-Base Excess: 9.1 mmol/L — ABNORMAL HIGH (ref 0.0–2.0)
Bicarbonate: 34.2 mmol/L — ABNORMAL HIGH (ref 20.0–28.0)
O2 Saturation: 99.3 %
Patient temperature: 37
pCO2 arterial: 47 mmHg (ref 32–48)
pH, Arterial: 7.47 — ABNORMAL HIGH (ref 7.35–7.45)
pO2, Arterial: 125 mmHg — ABNORMAL HIGH (ref 83–108)

## 2022-09-27 MED ORDER — DIATRIZOATE MEGLUMINE & SODIUM 66-10 % PO SOLN
ORAL | Status: AC
  Administered 2022-09-27: 30 mL via GASTROSTOMY
  Filled 2022-09-27: qty 30

## 2022-09-28 DIAGNOSIS — J449 Chronic obstructive pulmonary disease, unspecified: Secondary | ICD-10-CM | POA: Diagnosis not present

## 2022-09-28 DIAGNOSIS — G9341 Metabolic encephalopathy: Secondary | ICD-10-CM | POA: Diagnosis not present

## 2022-09-28 DIAGNOSIS — J189 Pneumonia, unspecified organism: Secondary | ICD-10-CM | POA: Diagnosis not present

## 2022-09-28 DIAGNOSIS — J9621 Acute and chronic respiratory failure with hypoxia: Secondary | ICD-10-CM | POA: Diagnosis not present

## 2022-09-28 LAB — BASIC METABOLIC PANEL
Anion gap: 5 (ref 5–15)
BUN: 17 mg/dL (ref 8–23)
CO2: 28 mmol/L (ref 22–32)
Calcium: 8.3 mg/dL — ABNORMAL LOW (ref 8.9–10.3)
Chloride: 111 mmol/L (ref 98–111)
Creatinine, Ser: 1.34 mg/dL — ABNORMAL HIGH (ref 0.44–1.00)
GFR, Estimated: 43 mL/min — ABNORMAL LOW (ref >60)
Glucose, Bld: 127 mg/dL — ABNORMAL HIGH (ref 70–99)
Potassium: 4 mmol/L (ref 3.5–5.1)
Sodium: 144 mmol/L (ref 135–145)

## 2022-09-28 LAB — PREPARE RBC (CROSSMATCH)

## 2022-09-28 LAB — ABO/RH: ABO/RH(D): A NEG

## 2022-09-28 LAB — CBC
HCT: 23.1 % — ABNORMAL LOW (ref 36.0–46.0)
Hemoglobin: 7 g/dL — ABNORMAL LOW (ref 12.0–15.0)
MCH: 32.1 pg (ref 26.0–34.0)
MCHC: 30.3 g/dL (ref 30.0–36.0)
MCV: 106 fL — ABNORMAL HIGH (ref 80.0–100.0)
Platelets: 195 10*3/uL (ref 150–400)
RBC: 2.18 MIL/uL — ABNORMAL LOW (ref 3.87–5.11)
RDW: 18.6 % — ABNORMAL HIGH (ref 11.5–15.5)
WBC: 6.1 10*3/uL (ref 4.0–10.5)
nRBC: 0 % (ref 0.0–0.2)

## 2022-09-28 NOTE — Consult Note (Signed)
Pulmonary Critical Care Medicine [facility] [place]  PULMONARY SERVICE  Date of Service: 09/28/2022  PULMONARY CRITICAL CARE CONSULT   [name]  RUE:[id number]  DOB: [birth year]   DOA: 09/27/2022  Referring Physician: Luna Kitchens, MD  HPI: [name] is a 71 y.o. female seen for follow up of Acute on Chronic Respiratory Failure.  Patient has multiple medical problems including cirrhosis asthma COPD hypertension hyperlipidemia diabetes chronic kidney disease came into the hospital because of abnormal lab work.  Patient was severely hyponatremic and had a potassium of 7.  She also was noted to be significantly bradycardic with heart rate in the 40s.  Patient was transferred to the ICU had worsening of her sodium was started on hypertonic saline and other complications included development of sepsis and shock and pneumonia.  She was treated with antibiotics not able to come off the ventilator subsequently had to have a tracheostomy done.  Transferred to our facility for further management and weight  Review of Systems:  ROS performed and is unremarkable other than noted above.  Past Medical History:  Diagnosis Date   Abdominal pain, other specified site   Acquired absence of both cervix and uterus   Acquired cyst of kidney 10/28/2011   Acute knee pain   Asthma   Bladder disorder   Cataract   Cellulitis  left leg   Cellulitis of lower leg   Cirrhosis (HCC)   Constipation 10/28/2011   COPD (chronic obstructive pulmonary disease) (HCC)   Depression with anxiety   Diabetes mellitus  LAST NIGHT BS 131 AICHG 5.3   Dyslipidemia   GERD (gastroesophageal reflux disease)   History of blood clots   Hypertension   Incontinence   Iron deficiency   Lateral meniscal tear  left   Left hip pain   Left wrist injury   Leg cramps   Leg pain, left   Medial meniscus tear  left   MRSA nasal colonization   Obesity  morbid   Observed sleep apnea    Osteoarthritis  left hip and knee   PONV (postoperative nausea and vomiting)   Restless leg syndrome   Right shoulder injury   Sleep apnea   Stasis dermatitis  chronic   Transfusion history   Urge incontinence 10/28/2011   Urgency of urination 10/28/2011   Venous stasis dermatitis of both lower extremities   Social History   Socioeconomic History   Marital status: Widowed  Spouse name: Not on file   Number of children: Not on file   Years of education: Not on file   Highest education level: Not on file  Occupational History   Not on file  Tobacco Use   Smoking status: Never   Smokeless tobacco: Never  Substance and Sexual Activity   Alcohol use: No   Drug use: No   Sexual activity: Not on file  Other Topics Concern   Not on file  Social History Narrative   Not on file   Social Determinants of Health   Financial Resource Strain: Not on file  Food Insecurity: Not on file  Transportation Needs: Not on file  Physical Activity: Not on file  Stress: Not on file  Social Connections: Not on file  Housing Stability: Not on file   Family History  Problem Relation Age of Onset   Diabetes Mother   Cancer Sister   Cataracts Sister   Hypertension Sister   Macular degeneration Sister   Thyroid disease Sister   Cancer Maternal Grandmother  Breast cancer Maternal Grandmother   Diabetes Daughter   Anesthesia problems Neg Hx   Eczema Neg Hx   Psoriasis Neg Hx    Physical Exam:  Vitals: Temperature is 97.0 pulse 86 respiratory is 30 blood pressure is 115/64 saturations 99%  Ventilator Settings assist-control FiO2 28% tidal volume 300 PEEP of 6  General: Comfortable at this time Eyes: Grossly normal lids, irises & conjunctiva ENT: grossly tongue is normal Neck: no obvious mass Cardiovascular: S1-S2 normal no gallop or rub Respiratory: Scattered rhonchi very coarse breath sounds Abdomen: Soft and nontender Skin: no rash seen on limited exam Musculoskeletal: not  rigid Psychiatric:unable to assess Neurologic: no seizure no involuntary movements         Labs on Admission:  Basic Metabolic Panel: Recent Labs  Lab 09/28/22 0219  NA 144  K 4.0  CL 111  CO2 28  GLUCOSE 127*  BUN 17  CREATININE 1.34*  CALCIUM 8.3*    Recent Labs  Lab 09/27/22 2120  PHART 7.47*  PCO2ART 47  PO2ART 125*  HCO3 34.2*  O2SAT 99.3    Liver Function Tests: No results for input(s): "AST", "ALT", "ALKPHOS", "BILITOT", "PROT", "ALBUMIN" in the last 168 hours. No results for input(s): "LIPASE", "AMYLASE" in the last 168 hours. No results for input(s): "AMMONIA" in the last 168 hours.  CBC: Recent Labs  Lab 09/28/22 0219  WBC 6.1  HGB 7.0*  HCT 23.1*  MCV 106.0*  PLT 195    Cardiac Enzymes: No results for input(s): "CKTOTAL", "CKMB", "CKMBINDEX", "TROPONINI" in the last 168 hours.  BNP (last 3 results) No results for input(s): "BNP" in the last 8760 hours.  ProBNP (last 3 results) No results for input(s): "PROBNP" in the last 8760 hours.   Radiological Exams on Admission: DG ABDOMEN PEG TUBE LOCATION  Result Date: 09/27/2022 CLINICAL DATA:  Check gastrostomy catheter placement EXAM: ABDOMEN - 1 VIEW COMPARISON:  None Available. FINDINGS: Contrast injected through indwelling gastrostomy flows freely into the stomach. No other focal abnormality is noted. IMPRESSION: Gastrostomy catheter within the stomach. Electronically Signed   By: Alcide Clever M.D.   On: 09/27/2022 23:31    Assessment/Plan Active Problems:   Acute on chronic respiratory failure with hypoxia (HCC)   Multifocal pneumonia   Tracheostomy status (HCC)   Acute metabolic encephalopathy   COPD, severe (HCC)   Acute on chronic respiratory failure hypoxia at this time patient is on assist control full support respiratory therapy will assess the wean readiness and try to start weaning protocol.  We will continue with pulmonary toilet secretion management. Multifocal pneumonia  patient had chest x-ray which appeared to be worsening patient has been on antibiotics plan is to continue to monitor follow-up on cultures. Tracheostomy status patient has tracheostomy in place for ongoing medical support with the ventilator. Metabolic encephalopathy felt to be due to electrolyte imbalance continue to monitor patient's labs closely we will continue fluid as warranted. Severe COPD medical management inhalers and nebulizers as deemed needed  I have personally seen and evaluated the patient, evaluated laboratory and imaging results, formulated the assessment and plan and placed orders. The Patient requires high complexity decision making with multiple systems involvement.  Case was discussed on Rounds with the Respiratory Therapy Director and the Respiratory staff Time Spent  Yevonne Pax, MD [facility] Pulmonary Critical Care Medicine Sleep Medicine

## 2022-09-29 DIAGNOSIS — J9621 Acute and chronic respiratory failure with hypoxia: Secondary | ICD-10-CM | POA: Diagnosis not present

## 2022-09-29 DIAGNOSIS — J189 Pneumonia, unspecified organism: Secondary | ICD-10-CM | POA: Diagnosis not present

## 2022-09-29 DIAGNOSIS — J449 Chronic obstructive pulmonary disease, unspecified: Secondary | ICD-10-CM | POA: Diagnosis not present

## 2022-09-29 DIAGNOSIS — G9341 Metabolic encephalopathy: Secondary | ICD-10-CM | POA: Diagnosis not present

## 2022-09-29 LAB — BLOOD GAS, ARTERIAL
Acid-Base Excess: 8.1 mmol/L — ABNORMAL HIGH (ref 0.0–2.0)
Bicarbonate: 33.4 mmol/L — ABNORMAL HIGH (ref 20.0–28.0)
O2 Saturation: 98.5 %
Patient temperature: 36.4
pCO2 arterial: 47 mmHg (ref 32–48)
pH, Arterial: 7.46 — ABNORMAL HIGH (ref 7.35–7.45)
pO2, Arterial: 147 mmHg — ABNORMAL HIGH (ref 83–108)

## 2022-09-29 LAB — TYPE AND SCREEN
ABO/RH(D): A NEG
Antibody Screen: NEGATIVE
Unit division: 0

## 2022-09-29 LAB — CBC
HCT: 27.9 % — ABNORMAL LOW (ref 36.0–46.0)
Hemoglobin: 8.6 g/dL — ABNORMAL LOW (ref 12.0–15.0)
MCH: 32 pg (ref 26.0–34.0)
MCHC: 30.8 g/dL (ref 30.0–36.0)
MCV: 103.7 fL — ABNORMAL HIGH (ref 80.0–100.0)
Platelets: 217 10*3/uL (ref 150–400)
RBC: 2.69 MIL/uL — ABNORMAL LOW (ref 3.87–5.11)
RDW: 18.9 % — ABNORMAL HIGH (ref 11.5–15.5)
WBC: 5.2 10*3/uL (ref 4.0–10.5)
nRBC: 0 % (ref 0.0–0.2)

## 2022-09-29 LAB — BPAM RBC
Blood Product Expiration Date: 202310262359
ISSUE DATE / TIME: 202310031311
Unit Type and Rh: 600

## 2022-09-29 NOTE — Progress Notes (Signed)
Pulmonary [place]   PULMONARY CRITICAL CARE SERVICE  PROGRESS NOTE     [name]  [id number]:[id number]  DOB: April 04, 1951   DOA: 09/27/2022  Referring Physician: Satira Sark, MD  HPI: [name] is a 71 y.o. female being followed for ventilator/airway/oxygen weaning Acute on Chronic Respiratory Failure.  Patient is on the ventilator full support currently on assist control not yet started on weight  Medications: Reviewed on Rounds  Physical Exam:  Vitals: Temperature 97 pulse 86 respiratory is 30 blood pressure is 120/60 saturations 95%  Ventilator Settings assist-control FiO2 is 28% tidal volume 300 PEEP of 6  General: Comfortable at this time Neck: supple Cardiovascular: no malignant arrhythmias Respiratory: No rhonchi coarse breath sounds Skin: no rash seen on limited exam Musculoskeletal: No gross abnormality Psychiatric:unable to assess Neurologic:no involuntary movements         Lab Data:   Basic Metabolic Panel: Recent Labs  Lab 09/28/22 0219  NA 144  K 4.0  CL 111  CO2 28  GLUCOSE 127*  BUN 17  CREATININE 1.34*  CALCIUM 8.3*    ABG: Recent Labs  Lab 09/27/22 2120  PHART 7.47*  PCO2ART 47  PO2ART 125*  HCO3 34.2*  O2SAT 99.3    Liver Function Tests: No results for input(s): "AST", "ALT", "ALKPHOS", "BILITOT", "PROT", "ALBUMIN" in the last 168 hours. No results for input(s): "LIPASE", "AMYLASE" in the last 168 hours. No results for input(s): "AMMONIA" in the last 168 hours.  CBC: Recent Labs  Lab 09/28/22 0219 09/29/22 0147  WBC 6.1 5.2  HGB 7.0* 8.6*  HCT 23.1* 27.9*  MCV 106.0* 103.7*  PLT 195 217    Cardiac Enzymes: No results for input(s): "CKTOTAL", "CKMB", "CKMBINDEX", "TROPONINI" in the last 168 hours.  BNP (last 3 results) No results for input(s): "BNP" in the last 8760 hours.  ProBNP (last 3 results) No results for input(s): "PROBNP" in the last 8760  hours.  Radiological Exams: DG ABDOMEN PEG TUBE LOCATION  Result Date: 09/27/2022 CLINICAL DATA:  Check gastrostomy catheter placement EXAM: ABDOMEN - 1 VIEW COMPARISON:  None Available. FINDINGS: Contrast injected through indwelling gastrostomy flows freely into the stomach. No other focal abnormality is noted. IMPRESSION: Gastrostomy catheter within the stomach. Electronically Signed   By: Inez Catalina M.D.   On: 09/27/2022 23:31    Assessment/Plan Active Problems:   Acute on chronic respiratory failure with hypoxia (HCC)   Multifocal pneumonia   Tracheostomy status (HCC)   Acute metabolic encephalopathy   COPD, severe (HCC)   Acute on chronic respiratory failure hypoxia we will continue with supportive care patient is going to start on weaning protocol per respiratory therapy Multifocal pneumonia has been treated with antibiotics we will continue to follow-up on x-rays. Tracheostomy remains in place for ventilator management Metabolic encephalopathy overall no change we will continue to follow along closely. Severe COPD medical management   I have personally seen and evaluated the patient, evaluated laboratory and imaging results, formulated the assessment and plan and placed orders. The Patient requires high complexity decision making with multiple systems involvement.  Rounds were done with the Respiratory Therapy Director and Staff therapists and discussed with nursing staff also.  [name], MD [facility] Pulmonary Critical Care Medicine Sleep Medicine

## 2022-09-30 DIAGNOSIS — J9621 Acute and chronic respiratory failure with hypoxia: Secondary | ICD-10-CM | POA: Diagnosis not present

## 2022-09-30 DIAGNOSIS — J449 Chronic obstructive pulmonary disease, unspecified: Secondary | ICD-10-CM | POA: Diagnosis not present

## 2022-09-30 DIAGNOSIS — G9341 Metabolic encephalopathy: Secondary | ICD-10-CM | POA: Diagnosis not present

## 2022-09-30 DIAGNOSIS — J189 Pneumonia, unspecified organism: Secondary | ICD-10-CM | POA: Diagnosis not present

## 2022-09-30 LAB — BASIC METABOLIC PANEL
Anion gap: 8 (ref 5–15)
BUN: 19 mg/dL (ref 8–23)
CO2: 27 mmol/L (ref 22–32)
Calcium: 8.8 mg/dL — ABNORMAL LOW (ref 8.9–10.3)
Chloride: 107 mmol/L (ref 98–111)
Creatinine, Ser: 1.2 mg/dL — ABNORMAL HIGH (ref 0.44–1.00)
GFR, Estimated: 49 mL/min — ABNORMAL LOW (ref >60)
Glucose, Bld: 146 mg/dL — ABNORMAL HIGH (ref 70–99)
Potassium: 4.5 mmol/L (ref 3.5–5.1)
Sodium: 142 mmol/L (ref 135–145)

## 2022-09-30 LAB — CBC
HCT: 28.2 % — ABNORMAL LOW (ref 36.0–46.0)
Hemoglobin: 8.8 g/dL — ABNORMAL LOW (ref 12.0–15.0)
MCH: 32.4 pg (ref 26.0–34.0)
MCHC: 31.2 g/dL (ref 30.0–36.0)
MCV: 103.7 fL — ABNORMAL HIGH (ref 80.0–100.0)
Platelets: 246 10*3/uL (ref 150–400)
RBC: 2.72 MIL/uL — ABNORMAL LOW (ref 3.87–5.11)
RDW: 18.1 % — ABNORMAL HIGH (ref 11.5–15.5)
WBC: 5.2 10*3/uL (ref 4.0–10.5)
nRBC: 0 % (ref 0.0–0.2)

## 2022-09-30 LAB — MAGNESIUM: Magnesium: 1.9 mg/dL (ref 1.7–2.4)

## 2022-09-30 NOTE — Progress Notes (Signed)
Pulmonary [place]   PULMONARY CRITICAL CARE SERVICE  PROGRESS NOTE     [name]  KGM:[id number]  DOB: 10-Mar-1951   DOA: 09/27/2022  Referring Physician: Satira Sark, MD  HPI: [name] is a 71 y.o. female being followed for ventilator/airway/oxygen weaning Acute on Chronic Respiratory Failure.  Patient is on assist control mode has been on full support on 28% FiO2  Medications: Reviewed on Rounds  Physical Exam:  Vitals: Temperature is 97.5 pulse 92 respiratory is 35 blood pressure is 139/61 saturations 94%  Ventilator Settings assist-control FiO2 28% tidal volume 385 PEEP of 6  General: Comfortable at this time Neck: supple Cardiovascular: no malignant arrhythmias Respiratory: Scattered rhonchi expansion is equal Skin: no rash seen on limited exam Musculoskeletal: No gross abnormality Psychiatric:unable to assess Neurologic:no involuntary movements         Lab Data:   Basic Metabolic Panel: Recent Labs  Lab 09/28/22 0219 09/30/22 0144  NA 144 142  K 4.0 4.5  CL 111 107  CO2 28 27  GLUCOSE 127* 146*  BUN 17 19  CREATININE 1.34* 1.20*  CALCIUM 8.3* 8.8*  MG  --  1.9    ABG: Recent Labs  Lab 09/27/22 2120 09/29/22 1001  PHART 7.47* 7.46*  PCO2ART 47 47  PO2ART 125* 147*  HCO3 34.2* 33.4*  O2SAT 99.3 98.5    Liver Function Tests: No results for input(s): "AST", "ALT", "ALKPHOS", "BILITOT", "PROT", "ALBUMIN" in the last 168 hours. No results for input(s): "LIPASE", "AMYLASE" in the last 168 hours. No results for input(s): "AMMONIA" in the last 168 hours.  CBC: Recent Labs  Lab 09/28/22 0219 09/29/22 0147 09/30/22 0144  WBC 6.1 5.2 5.2  HGB 7.0* 8.6* 8.8*  HCT 23.1* 27.9* 28.2*  MCV 106.0* 103.7* 103.7*  PLT 195 217 246    Cardiac Enzymes: No results for input(s): "CKTOTAL", "CKMB", "CKMBINDEX", "TROPONINI" in the last 168 hours.  BNP (last 3 results) No results  for input(s): "BNP" in the last 8760 hours.  ProBNP (last 3 results) No results for input(s): "PROBNP" in the last 8760 hours.  Radiological Exams: No results found.  Assessment/Plan Active Problems:   Acute on chronic respiratory failure with hypoxia (HCC)   Multifocal pneumonia   Tracheostomy status (HCC)   Acute metabolic encephalopathy   COPD, severe (HCC)   Acute on chronic respiratory failure hypoxia patient continues to fail attempts at weaning we will have respiratory therapy continue to assess the R SBI Multifocal pneumonia has been treated with antibiotics we will continue to follow along closely. Tracheostomy remains in place continue with supportive care Metabolic encephalopathy slow to improve Severe COPD medical management   I have personally seen and evaluated the patient, evaluated laboratory and imaging results, formulated the assessment and plan and placed orders. The Patient requires high complexity decision making with multiple systems involvement.  Rounds were done with the Respiratory Therapy Director and Staff therapists and discussed with nursing staff also.  [name], MD [facility] Pulmonary Critical Care Medicine Sleep Medicine

## 2022-10-01 ENCOUNTER — Other Ambulatory Visit (HOSPITAL_COMMUNITY): Payer: Medicare Other

## 2022-10-01 DIAGNOSIS — J9621 Acute and chronic respiratory failure with hypoxia: Secondary | ICD-10-CM | POA: Diagnosis not present

## 2022-10-01 DIAGNOSIS — G9341 Metabolic encephalopathy: Secondary | ICD-10-CM | POA: Diagnosis not present

## 2022-10-01 DIAGNOSIS — J189 Pneumonia, unspecified organism: Secondary | ICD-10-CM | POA: Diagnosis not present

## 2022-10-01 DIAGNOSIS — J449 Chronic obstructive pulmonary disease, unspecified: Secondary | ICD-10-CM | POA: Diagnosis not present

## 2022-10-01 NOTE — Progress Notes (Signed)
Pulmonary [place]   PULMONARY CRITICAL CARE SERVICE  PROGRESS NOTE     Sharen Youngren Bridges  ASN:[id number]  DOB: 1951-03-09   DOA: 09/27/2022  Referring Physician: Satira Sark, MD  HPI: [name] is a 71 y.o. female being followed for ventilator/airway/oxygen weaning Acute on Chronic Respiratory Failure.  Patient currently is on assist control mode has been on 28% FiO2 with good saturations  Medications: Reviewed on Rounds  Physical Exam:  Vitals: Temperature 98.5 pulse 85 respiratory is 30 blood pressure is 106/64 saturations 100%  Ventilator Settings on assist control FiO2 is 28% tidal volume 300 PEEP of 6  General: Comfortable at this time Neck: supple Cardiovascular: no malignant arrhythmias Respiratory: Scattered rhonchi expansion is equal Skin: no rash seen on limited exam Musculoskeletal: No gross abnormality Psychiatric:unable to assess Neurologic:no involuntary movements         Lab Data:   Basic Metabolic Panel: Recent Labs  Lab 09/28/22 0219 09/30/22 0144  NA 144 142  K 4.0 4.5  CL 111 107  CO2 28 27  GLUCOSE 127* 146*  BUN 17 19  CREATININE 1.34* 1.20*  CALCIUM 8.3* 8.8*  MG  --  1.9    ABG: Recent Labs  Lab 09/27/22 2120 09/29/22 1001  PHART 7.47* 7.46*  PCO2ART 47 47  PO2ART 125* 147*  HCO3 34.2* 33.4*  O2SAT 99.3 98.5    Liver Function Tests: No results for input(s): "AST", "ALT", "ALKPHOS", "BILITOT", "PROT", "ALBUMIN" in the last 168 hours. No results for input(s): "LIPASE", "AMYLASE" in the last 168 hours. No results for input(s): "AMMONIA" in the last 168 hours.  CBC: Recent Labs  Lab 09/28/22 0219 09/29/22 0147 09/30/22 0144  WBC 6.1 5.2 5.2  HGB 7.0* 8.6* 8.8*  HCT 23.1* 27.9* 28.2*  MCV 106.0* 103.7* 103.7*  PLT 195 217 246    Cardiac Enzymes: No results for input(s): "CKTOTAL", "CKMB", "CKMBINDEX", "TROPONINI" in the last 168 hours.  BNP (last 3  results) No results for input(s): "BNP" in the last 8760 hours.  ProBNP (last 3 results) No results for input(s): "PROBNP" in the last 8760 hours.  Radiological Exams: No results found.  Assessment/Plan Active Problems:   Acute on chronic respiratory failure with hypoxia (HCC)   Multifocal pneumonia   Tracheostomy status (HCC)   Acute metabolic encephalopathy   COPD, severe (HCC)   Acute on chronic respiratory failure hypoxia we will continue with assist control titrate oxygen as tolerated continue pulmonary toilet Multifocal pneumonia has been treated with antibiotics we will continue with supportive care Tracheostomy remains in place Metabolic encephalopathy grossly unchanged Severe COPD medical management   I have personally seen and evaluated the patient, evaluated laboratory and imaging results, formulated the assessment and plan and placed orders. The Patient requires high complexity decision making with multiple systems involvement.  Rounds were done with the Respiratory Therapy Director and Staff therapists and discussed with nursing staff also.  [name], MD [facility] Pulmonary Critical Care Medicine Sleep Medicine

## 2022-10-02 DIAGNOSIS — J9621 Acute and chronic respiratory failure with hypoxia: Secondary | ICD-10-CM | POA: Diagnosis not present

## 2022-10-02 DIAGNOSIS — J449 Chronic obstructive pulmonary disease, unspecified: Secondary | ICD-10-CM | POA: Diagnosis not present

## 2022-10-02 DIAGNOSIS — J189 Pneumonia, unspecified organism: Secondary | ICD-10-CM | POA: Diagnosis not present

## 2022-10-02 DIAGNOSIS — G9341 Metabolic encephalopathy: Secondary | ICD-10-CM | POA: Diagnosis not present

## 2022-10-02 NOTE — Progress Notes (Signed)
Pulmonary Critical Care Medicine [facility] [place]   PULMONARY CRITICAL CARE SERVICE  PROGRESS NOTE     [name]  VOZ:[id number]  DOB: 05-24-1951   DOA: 09/27/2022  Referring Physician: Luna Kitchens, MD  HPI: [name] is a 71 y.o. female being followed for ventilator/airway/oxygen weaning Acute on Chronic Respiratory Failure.  Patient is on the ventilator and full support failing spontaneous breathing trials  Medications: Reviewed on Rounds  Physical Exam:  Vitals: Temperature is 97.4 pulse 92 respiratory 27 blood pressure is 149/70 saturations 97%  Ventilator Settings currently on assist-control FiO2 is 28% tidal volume 300 PEEP of 6  General: Comfortable at this time Neck: supple Cardiovascular: no malignant arrhythmias Respiratory: No rhonchi very coarse breath sounds Skin: no rash seen on limited exam Musculoskeletal: No gross abnormality Psychiatric:unable to assess Neurologic:no involuntary movements         Lab Data:   Basic Metabolic Panel: Recent Labs  Lab 09/28/22 0219 09/30/22 0144  NA 144 142  K 4.0 4.5  CL 111 107  CO2 28 27  GLUCOSE 127* 146*  BUN 17 19  CREATININE 1.34* 1.20*  CALCIUM 8.3* 8.8*  MG  --  1.9    ABG: Recent Labs  Lab 09/27/22 2120 09/29/22 1001  PHART 7.47* 7.46*  PCO2ART 47 47  PO2ART 125* 147*  HCO3 34.2* 33.4*  O2SAT 99.3 98.5    Liver Function Tests: No results for input(s): "AST", "ALT", "ALKPHOS", "BILITOT", "PROT", "ALBUMIN" in the last 168 hours. No results for input(s): "LIPASE", "AMYLASE" in the last 168 hours. No results for input(s): "AMMONIA" in the last 168 hours.  CBC: Recent Labs  Lab 09/28/22 0219 09/29/22 0147 09/30/22 0144  WBC 6.1 5.2 5.2  HGB 7.0* 8.6* 8.8*  HCT 23.1* 27.9* 28.2*  MCV 106.0* 103.7* 103.7*  PLT 195 217 246    Cardiac Enzymes: No results for input(s): "CKTOTAL", "CKMB", "CKMBINDEX", "TROPONINI" in the last 168 hours.  BNP (last  3 results) No results for input(s): "BNP" in the last 8760 hours.  ProBNP (last 3 results) No results for input(s): "PROBNP" in the last 8760 hours.  Radiological Exams: DG Chest Port 1 View  Result Date: 10/01/2022 CLINICAL DATA:  Pneumonia.  COPD. EXAM: PORTABLE CHEST 1 VIEW COMPARISON:  One-view chest x-ray 02/17/2017 FINDINGS: Heart is enlarged. Diffuse interstitial pattern is present. Increased opacity is present both lung bases. Bilateral effusions are present. Tracheostomy tube is in satisfactory position. IMPRESSION: 1. Cardiomegaly with diffuse interstitial pattern and bilateral effusions compatible with congestive heart failure. 2. Bibasilar airspace disease likely reflects atelectasis. Electronically Signed   By: Marin Roberts M.D.   On: 10/01/2022 12:32    Assessment/Plan Active Problems:   Acute on chronic respiratory failure with hypoxia (HCC)   Multifocal pneumonia   Tracheostomy status (HCC)   Acute metabolic encephalopathy   COPD, severe (HCC)   Acute on chronic respiratory failure hypoxia plan is to continue to make assessments for spontaneous breathing trial will reassess again tomorrow Multifocal pneumonia has been treated we will continue to follow along closely. Tracheostomy remains in place Metabolic encephalopathy grossly unchanged Severe COPD medical management   I have personally seen and evaluated the patient, evaluated laboratory and imaging results, formulated the assessment and plan and placed orders. The Patient requires high complexity decision making with multiple systems involvement.  Rounds were done with the Respiratory Therapy Director and Staff therapists and discussed with nursing staff also.  Yevonne Pax, MD [facility] Pulmonary Critical  Care Medicine Sleep Medicine

## 2022-10-03 DIAGNOSIS — J9621 Acute and chronic respiratory failure with hypoxia: Secondary | ICD-10-CM | POA: Diagnosis not present

## 2022-10-03 DIAGNOSIS — J189 Pneumonia, unspecified organism: Secondary | ICD-10-CM | POA: Diagnosis not present

## 2022-10-03 DIAGNOSIS — G9341 Metabolic encephalopathy: Secondary | ICD-10-CM | POA: Diagnosis not present

## 2022-10-03 DIAGNOSIS — J449 Chronic obstructive pulmonary disease, unspecified: Secondary | ICD-10-CM | POA: Diagnosis not present

## 2022-10-03 LAB — BASIC METABOLIC PANEL
Anion gap: 7 (ref 5–15)
BUN: 35 mg/dL — ABNORMAL HIGH (ref 8–23)
CO2: 29 mmol/L (ref 22–32)
Calcium: 8.7 mg/dL — ABNORMAL LOW (ref 8.9–10.3)
Chloride: 99 mmol/L (ref 98–111)
Creatinine, Ser: 1.16 mg/dL — ABNORMAL HIGH (ref 0.44–1.00)
GFR, Estimated: 51 mL/min — ABNORMAL LOW (ref >60)
Glucose, Bld: 288 mg/dL — ABNORMAL HIGH (ref 70–99)
Potassium: 5.1 mmol/L (ref 3.5–5.1)
Sodium: 135 mmol/L (ref 135–145)

## 2022-10-03 LAB — CBC
HCT: 23.9 % — ABNORMAL LOW (ref 36.0–46.0)
Hemoglobin: 7.2 g/dL — ABNORMAL LOW (ref 12.0–15.0)
MCH: 31.6 pg (ref 26.0–34.0)
MCHC: 30.1 g/dL (ref 30.0–36.0)
MCV: 104.8 fL — ABNORMAL HIGH (ref 80.0–100.0)
Platelets: 247 10*3/uL (ref 150–400)
RBC: 2.28 MIL/uL — ABNORMAL LOW (ref 3.87–5.11)
RDW: 16.7 % — ABNORMAL HIGH (ref 11.5–15.5)
WBC: 8.4 10*3/uL (ref 4.0–10.5)
nRBC: 0 % (ref 0.0–0.2)

## 2022-10-03 LAB — MAGNESIUM: Magnesium: 1.9 mg/dL (ref 1.7–2.4)

## 2022-10-03 LAB — HEMOGLOBIN A1C
Hgb A1c MFr Bld: 4.9 % (ref 4.8–5.6)
Mean Plasma Glucose: 93.93 mg/dL

## 2022-10-03 NOTE — Progress Notes (Signed)
Pulmonary Critical Care Medicine [facility] [place]   PULMONARY CRITICAL CARE SERVICE  PROGRESS NOTE     Manaswini Fatzinger Bridges  NWG:[id number]  DOB: 04-22-1951   DOA: 09/27/2022  Referring Physician: Luna Kitchens, MD  HPI: [name] is a 71 y.o. female being followed for ventilator/airway/oxygen weaning Acute on Chronic Respiratory Failure.  Patient currently is on assist control mode has been on 28% FiO2 with good saturations.  Medications: Reviewed on Rounds  Physical Exam:  Vitals: Temperature 97.8 pulse 88 respiratory rate 21 blood pressure is 155/59 saturations 98%  Ventilator Settings assist-control FiO2 28% tidal volume 347 PEEP of 6  General: Comfortable at this time Neck: supple Cardiovascular: no malignant arrhythmias Respiratory: Scattered rhonchi expansion is equal Skin: no rash seen on limited exam Musculoskeletal: No gross abnormality Psychiatric:unable to assess Neurologic:no involuntary movements         Lab Data:   Basic Metabolic Panel: Recent Labs  Lab 09/28/22 0219 09/30/22 0144 10/03/22 0155  NA 144 142 135  K 4.0 4.5 5.1  CL 111 107 99  CO2 28 27 29   GLUCOSE 127* 146* 288*  BUN 17 19 35*  CREATININE 1.34* 1.20* 1.16*  CALCIUM 8.3* 8.8* 8.7*  MG  --  1.9 1.9    ABG: Recent Labs  Lab 09/27/22 2120 09/29/22 1001  PHART 7.47* 7.46*  PCO2ART 47 47  PO2ART 125* 147*  HCO3 34.2* 33.4*  O2SAT 99.3 98.5    Liver Function Tests: No results for input(s): "AST", "ALT", "ALKPHOS", "BILITOT", "PROT", "ALBUMIN" in the last 168 hours. No results for input(s): "LIPASE", "AMYLASE" in the last 168 hours. No results for input(s): "AMMONIA" in the last 168 hours.  CBC: Recent Labs  Lab 09/28/22 0219 09/29/22 0147 09/30/22 0144 10/03/22 0155  WBC 6.1 5.2 5.2 8.4  HGB 7.0* 8.6* 8.8* 7.2*  HCT 23.1* 27.9* 28.2* 23.9*  MCV 106.0* 103.7* 103.7* 104.8*  PLT 195 217 246 247    Cardiac Enzymes: No results for  input(s): "CKTOTAL", "CKMB", "CKMBINDEX", "TROPONINI" in the last 168 hours.  BNP (last 3 results) No results for input(s): "BNP" in the last 8760 hours.  ProBNP (last 3 results) No results for input(s): "PROBNP" in the last 8760 hours.  Radiological Exams: DG Chest Port 1 View  Result Date: 10/01/2022 CLINICAL DATA:  Pneumonia.  COPD. EXAM: PORTABLE CHEST 1 VIEW COMPARISON:  One-view chest x-ray 02/17/2017 FINDINGS: Heart is enlarged. Diffuse interstitial pattern is present. Increased opacity is present both lung bases. Bilateral effusions are present. Tracheostomy tube is in satisfactory position. IMPRESSION: 1. Cardiomegaly with diffuse interstitial pattern and bilateral effusions compatible with congestive heart failure. 2. Bibasilar airspace disease likely reflects atelectasis. Electronically Signed   By: Marin Roberts M.D.   On: 10/01/2022 12:32    Assessment/Plan Active Problems:   Acute on chronic respiratory failure with hypoxia (HCC)   Multifocal pneumonia   Tracheostomy status (HCC)   Acute metabolic encephalopathy   COPD, severe (HCC)   Acute on chronic respiratory failure hypoxia patient should be started on a PSV trial.  We will continue with secretion management supportive care Multifocal pneumonia has been treated we will continue to follow along closely. Tracheostomy remains in place at this time Metabolic encephalopathy grossly unchanged Severe COPD medical management   I have personally seen and evaluated the patient, evaluated laboratory and imaging results, formulated the assessment and plan and placed orders. The Patient requires high complexity decision making with multiple systems involvement.  Rounds were done  with the Respiratory Therapy Director and Staff therapists and discussed with nursing staff also.  Yevonne Pax, MD [facility] Pulmonary Critical Care Medicine Sleep Medicine

## 2022-10-04 DIAGNOSIS — G9341 Metabolic encephalopathy: Secondary | ICD-10-CM | POA: Diagnosis not present

## 2022-10-04 DIAGNOSIS — J9621 Acute and chronic respiratory failure with hypoxia: Secondary | ICD-10-CM | POA: Diagnosis not present

## 2022-10-04 DIAGNOSIS — J189 Pneumonia, unspecified organism: Secondary | ICD-10-CM | POA: Diagnosis not present

## 2022-10-04 DIAGNOSIS — J449 Chronic obstructive pulmonary disease, unspecified: Secondary | ICD-10-CM | POA: Diagnosis not present

## 2022-10-04 LAB — HEMOGLOBIN AND HEMATOCRIT, BLOOD
HCT: 27.9 % — ABNORMAL LOW (ref 36.0–46.0)
Hemoglobin: 8.3 g/dL — ABNORMAL LOW (ref 12.0–15.0)

## 2022-10-04 LAB — OCCULT BLOOD X 1 CARD TO LAB, STOOL: Fecal Occult Bld: NEGATIVE

## 2022-10-04 NOTE — Progress Notes (Signed)
Pulmonary [place]   PULMONARY CRITICAL CARE SERVICE  PROGRESS NOTE     [name]  BMW:[id number]  DOB: 1951/11/22   DOA: 09/27/2022  Referring Physician: Satira Sark, MD  HPI: [name] is a 71 y.o. female being followed for ventilator/airway/oxygen weaning Acute on Chronic Respiratory Failure.  Patient is afebrile resting comfortably on the weaning protocol on pressure support doing well  Medications: Reviewed on Rounds  Physical Exam:  Vitals: Temperature is 97.1 pulse 77 respiratory 24 blood pressure 141/72 saturations 99%  Ventilator Settings on pressure support FiO2 28% pressure 12/5  General: Comfortable at this time Neck: supple Cardiovascular: no malignant arrhythmias Respiratory: No rhonchi no rales noted Skin: no rash seen on limited exam Musculoskeletal: No gross abnormality Psychiatric:unable to assess Neurologic:no involuntary movements         Lab Data:   Basic Metabolic Panel: Recent Labs  Lab 09/28/22 0219 09/30/22 0144 10/03/22 0155  NA 144 142 135  K 4.0 4.5 5.1  CL 111 107 99  CO2 28 27 29   GLUCOSE 127* 146* 288*  BUN 17 19 35*  CREATININE 1.34* 1.20* 1.16*  CALCIUM 8.3* 8.8* 8.7*  MG  --  1.9 1.9    ABG: Recent Labs  Lab 09/27/22 2120 09/29/22 1001  PHART 7.47* 7.46*  PCO2ART 47 47  PO2ART 125* 147*  HCO3 34.2* 33.4*  O2SAT 99.3 98.5    Liver Function Tests: No results for input(s): "AST", "ALT", "ALKPHOS", "BILITOT", "PROT", "ALBUMIN" in the last 168 hours. No results for input(s): "LIPASE", "AMYLASE" in the last 168 hours. No results for input(s): "AMMONIA" in the last 168 hours.  CBC: Recent Labs  Lab 09/28/22 0219 09/29/22 0147 09/30/22 0144 10/03/22 0155 10/04/22 0439  WBC 6.1 5.2 5.2 8.4  --   HGB 7.0* 8.6* 8.8* 7.2* 8.3*  HCT 23.1* 27.9* 28.2* 23.9* 27.9*  MCV 106.0* 103.7* 103.7* 104.8*  --   PLT 195 217 246 247  --     Cardiac  Enzymes: No results for input(s): "CKTOTAL", "CKMB", "CKMBINDEX", "TROPONINI" in the last 168 hours.  BNP (last 3 results) No results for input(s): "BNP" in the last 8760 hours.  ProBNP (last 3 results) No results for input(s): "PROBNP" in the last 8760 hours.  Radiological Exams: No results found.  Assessment/Plan Active Problems:   Acute on chronic respiratory failure with hypoxia (HCC)   Multifocal pneumonia   Tracheostomy status (HCC)   Acute metabolic encephalopathy   COPD, severe (HCC)   Acute on chronic respiratory failure hypoxia continue to wean on pressure support as tolerated pressure of 12/5 Multifocal pneumonia has been treated with antibiotics we will continue to monitor Metabolic encephalopathy waxing waning status we will continue with supportive care Severe COPD medical management we will continue to monitor Tracheostomy remains in place   I have personally seen and evaluated the patient, evaluated laboratory and imaging results, formulated the assessment and plan and placed orders. The Patient requires high complexity decision making with multiple systems involvement.  Rounds were done with the Respiratory Therapy Director and Staff therapists and discussed with nursing staff also.  [name], MD [facility] Pulmonary Critical Care Medicine Sleep Medicine

## 2022-10-05 DIAGNOSIS — J449 Chronic obstructive pulmonary disease, unspecified: Secondary | ICD-10-CM | POA: Diagnosis not present

## 2022-10-05 DIAGNOSIS — J9621 Acute and chronic respiratory failure with hypoxia: Secondary | ICD-10-CM | POA: Diagnosis not present

## 2022-10-05 DIAGNOSIS — J189 Pneumonia, unspecified organism: Secondary | ICD-10-CM | POA: Diagnosis not present

## 2022-10-05 DIAGNOSIS — G9341 Metabolic encephalopathy: Secondary | ICD-10-CM | POA: Diagnosis not present

## 2022-10-05 NOTE — Progress Notes (Signed)
Pulmonary [place]   PULMONARY CRITICAL CARE SERVICE  PROGRESS NOTE     [name]  KWI:[id number]  DOB: Nov 17, 1951   DOA: 09/27/2022  Referring Physician: Satira Sark, MD  HPI: [name] is a 71 y.o. female being followed for ventilator/airway/oxygen weaning Acute on Chronic Respiratory Failure.  Patient is currently on pressure support goal of 8 hours Pressure 12/6  Medications: Reviewed on Rounds  Physical Exam:  Vitals: Temperature is 96.3 pulse of 79 respiratory is 24 blood pressure is 127/67 saturation 97%  Ventilator Settings pressure support FiO2 28% pressure 12/6  General: Comfortable at this time Neck: supple Cardiovascular: no malignant arrhythmias Respiratory: No rhonchi no rales are noted at this time Skin: no rash seen on limited exam Musculoskeletal: No gross abnormality Psychiatric:unable to assess Neurologic:no involuntary movements         Lab Data:   Basic Metabolic Panel: Recent Labs  Lab 09/30/22 0144 10/03/22 0155  NA 142 135  K 4.5 5.1  CL 107 99  CO2 27 29  GLUCOSE 146* 288*  BUN 19 35*  CREATININE 1.20* 1.16*  CALCIUM 8.8* 8.7*  MG 1.9 1.9    ABG: Recent Labs  Lab 09/29/22 1001  PHART 7.46*  PCO2ART 47  PO2ART 147*  HCO3 33.4*  O2SAT 98.5    Liver Function Tests: No results for input(s): "AST", "ALT", "ALKPHOS", "BILITOT", "PROT", "ALBUMIN" in the last 168 hours. No results for input(s): "LIPASE", "AMYLASE" in the last 168 hours. No results for input(s): "AMMONIA" in the last 168 hours.  CBC: Recent Labs  Lab 09/29/22 0147 09/30/22 0144 10/03/22 0155 10/04/22 0439  WBC 5.2 5.2 8.4  --   HGB 8.6* 8.8* 7.2* 8.3*  HCT 27.9* 28.2* 23.9* 27.9*  MCV 103.7* 103.7* 104.8*  --   PLT 217 246 247  --     Cardiac Enzymes: No results for input(s): "CKTOTAL", "CKMB", "CKMBINDEX", "TROPONINI" in the last 168 hours.  BNP (last 3 results) No results for  input(s): "BNP" in the last 8760 hours.  ProBNP (last 3 results) No results for input(s): "PROBNP" in the last 8760 hours.  Radiological Exams: No results found.  Assessment/Plan Active Problems:   Acute on chronic respiratory failure with hypoxia (HCC)   Multifocal pneumonia   Tracheostomy status (HCC)   Acute metabolic encephalopathy   COPD, severe (HCC)   Acute on chronic respiratory failure hypoxia we will continue with pressure support mode titrate oxygen as tolerated continue secretion management pulmonary toilet Multifocal pneumonia has been treated with antibiotics we will continue to follow along closely. Tracheostomy remains in place we will continue with supportive care Metabolic encephalopathy grossly unchanged. Severe COPD inhalers and nebulizers   I have personally seen and evaluated the patient, evaluated laboratory and imaging results, formulated the assessment and plan and placed orders. The Patient requires high complexity decision making with multiple systems involvement.  Rounds were done with the Respiratory Therapy Director and Staff therapists and discussed with nursing staff also.  [name], MD [facility] Pulmonary Critical Care Medicine Sleep Medicine

## 2022-10-06 DIAGNOSIS — J189 Pneumonia, unspecified organism: Secondary | ICD-10-CM | POA: Diagnosis not present

## 2022-10-06 DIAGNOSIS — G9341 Metabolic encephalopathy: Secondary | ICD-10-CM

## 2022-10-06 DIAGNOSIS — J449 Chronic obstructive pulmonary disease, unspecified: Secondary | ICD-10-CM | POA: Diagnosis not present

## 2022-10-06 DIAGNOSIS — J9621 Acute and chronic respiratory failure with hypoxia: Secondary | ICD-10-CM | POA: Diagnosis not present

## 2022-10-06 DIAGNOSIS — Z930 Tracheostomy status: Secondary | ICD-10-CM

## 2022-10-06 LAB — BASIC METABOLIC PANEL
Anion gap: 8 (ref 5–15)
BUN: 53 mg/dL — ABNORMAL HIGH (ref 8–23)
CO2: 29 mmol/L (ref 22–32)
Calcium: 8.4 mg/dL — ABNORMAL LOW (ref 8.9–10.3)
Chloride: 99 mmol/L (ref 98–111)
Creatinine, Ser: 1.1 mg/dL — ABNORMAL HIGH (ref 0.44–1.00)
GFR, Estimated: 54 mL/min — ABNORMAL LOW (ref >60)
Glucose, Bld: 155 mg/dL — ABNORMAL HIGH (ref 70–99)
Potassium: 5 mmol/L (ref 3.5–5.1)
Sodium: 136 mmol/L (ref 135–145)

## 2022-10-06 LAB — CBC
HCT: 26.1 % — ABNORMAL LOW (ref 36.0–46.0)
Hemoglobin: 8.2 g/dL — ABNORMAL LOW (ref 12.0–15.0)
MCH: 32.5 pg (ref 26.0–34.0)
MCHC: 31.4 g/dL (ref 30.0–36.0)
MCV: 103.6 fL — ABNORMAL HIGH (ref 80.0–100.0)
Platelets: 204 10*3/uL (ref 150–400)
RBC: 2.52 MIL/uL — ABNORMAL LOW (ref 3.87–5.11)
RDW: 16.9 % — ABNORMAL HIGH (ref 11.5–15.5)
WBC: 6.2 10*3/uL (ref 4.0–10.5)
nRBC: 0 % (ref 0.0–0.2)

## 2022-10-06 LAB — MAGNESIUM: Magnesium: 1.7 mg/dL (ref 1.7–2.4)

## 2022-10-06 NOTE — Progress Notes (Signed)
Pulmonary [place]   PULMONARY CRITICAL CARE SERVICE  PROGRESS NOTE     [name]  HDQ:[id number]  DOB: [birth year]   DOA: 09/27/2022  Referring Physician: Satira Sark, MD  HPI: [name] is a 71 y.o. female being followed for ventilator/airway/oxygen weaning Acute on Chronic Respiratory Failure.  Patient is currently on pressure support goal of 12 hours  Medications: Reviewed on Rounds  Physical Exam:  Vitals: Temperature is 97 pulse of 80 respiratory rate is 20 blood pressure is 132/61 saturations 100%  Ventilator Settings on pressure support FiO2 is 28% pressure 12/5  General: Comfortable at this time Neck: supple Cardiovascular: no malignant arrhythmias Respiratory: No rhonchi no rales are noted at this time Skin: no rash seen on limited exam Musculoskeletal: No gross abnormality Psychiatric:unable to assess Neurologic:no involuntary movements         Lab Data:   Basic Metabolic Panel: Recent Labs  Lab 09/30/22 0144 10/03/22 0155 10/06/22 0404  NA 142 135 136  K 4.5 5.1 5.0  CL 107 99 99  CO2 27 29 29   GLUCOSE 146* 288* 155*  BUN 19 35* 53*  CREATININE 1.20* 1.16* 1.10*  CALCIUM 8.8* 8.7* 8.4*  MG 1.9 1.9 1.7    ABG: No results for input(s): "PHART", "PCO2ART", "PO2ART", "HCO3", "O2SAT" in the last 168 hours.  Liver Function Tests: No results for input(s): "AST", "ALT", "ALKPHOS", "BILITOT", "PROT", "ALBUMIN" in the last 168 hours. No results for input(s): "LIPASE", "AMYLASE" in the last 168 hours. No results for input(s): "AMMONIA" in the last 168 hours.  CBC: Recent Labs  Lab 09/30/22 0144 10/03/22 0155 10/04/22 0439 10/06/22 0404  WBC 5.2 8.4  --  6.2  HGB 8.8* 7.2* 8.3* 8.2*  HCT 28.2* 23.9* 27.9* 26.1*  MCV 103.7* 104.8*  --  103.6*  PLT 246 247  --  204    Cardiac Enzymes: No results for input(s): "CKTOTAL", "CKMB", "CKMBINDEX", "TROPONINI" in the last 168  hours.  BNP (last 3 results) No results for input(s): "BNP" in the last 8760 hours.  ProBNP (last 3 results) No results for input(s): "PROBNP" in the last 8760 hours.  Radiological Exams: No results found.  Assessment/Plan Active Problems:   Acute on chronic respiratory failure with hypoxia (HCC)   Multifocal pneumonia   Tracheostomy status (HCC)   Acute metabolic encephalopathy   COPD, severe (HCC)   Acute on chronic respiratory failure with hypoxia we will continue weaning on pressure support goal is 12-hour wean today. Multifocal pneumonia has been treated with antibiotics we will continue to follow along Metabolic encephalopathy waxing waning status Tracheostomy remains in place at this time Severe COPD medical management   I have personally seen and evaluated the patient, evaluated laboratory and imaging results, formulated the assessment and plan and placed orders. The Patient requires high complexity decision making with multiple systems involvement.  Rounds were done with the Respiratory Therapy Director and Staff therapists and discussed with nursing staff also.  [name], MD [facility] Pulmonary Critical Care Medicine Sleep Medicine

## 2022-10-07 DIAGNOSIS — G9341 Metabolic encephalopathy: Secondary | ICD-10-CM | POA: Diagnosis not present

## 2022-10-07 DIAGNOSIS — Z930 Tracheostomy status: Secondary | ICD-10-CM | POA: Insufficient documentation

## 2022-10-07 DIAGNOSIS — J189 Pneumonia, unspecified organism: Secondary | ICD-10-CM

## 2022-10-07 DIAGNOSIS — J449 Chronic obstructive pulmonary disease, unspecified: Secondary | ICD-10-CM | POA: Diagnosis not present

## 2022-10-07 DIAGNOSIS — J9621 Acute and chronic respiratory failure with hypoxia: Secondary | ICD-10-CM | POA: Diagnosis not present

## 2022-10-07 LAB — MAGNESIUM
Magnesium: 1.8 mg/dL (ref 1.7–2.4)
Magnesium: 2.1 mg/dL (ref 1.7–2.4)

## 2022-10-07 NOTE — Progress Notes (Signed)
Pulmonary [place]   PULMONARY CRITICAL CARE SERVICE  PROGRESS NOTE     [name]  BMW:[id number]  DOB: [birth year]   DOA: 09/27/2022  Referring Physician: Satira Sark, MD  HPI: [name] is a 71 y.o. female being followed for ventilator/airway/oxygen weaning Acute on Chronic Respiratory Failure.  Patient currently is on pressure support supposed to be doing 16 hours of weaning  Medications: Reviewed on Rounds  Physical Exam:  Vitals: Temperature is 96.2 pulse 77 respiratory 22 blood pressure is 149/66 saturations 95%  Ventilator Settings pressure support FiO2 28% pressure 12/5  General: Comfortable at this time Neck: supple Cardiovascular: no malignant arrhythmias Respiratory: Scattered rhonchi expansion; Skin: no rash seen on limited exam Musculoskeletal: No gross abnormality Psychiatric:unable to assess Neurologic:no involuntary movements         Lab Data:   Basic Metabolic Panel: Recent Labs  Lab 10/03/22 0155 10/06/22 0404 10/07/22 0016  NA 135 136  --   K 5.1 5.0  --   CL 99 99  --   CO2 29 29  --   GLUCOSE 288* 155*  --   BUN 35* 53*  --   CREATININE 1.16* 1.10*  --   CALCIUM 8.7* 8.4*  --   MG 1.9 1.7 1.8    ABG: No results for input(s): "PHART", "PCO2ART", "PO2ART", "HCO3", "O2SAT" in the last 168 hours.  Liver Function Tests: No results for input(s): "AST", "ALT", "ALKPHOS", "BILITOT", "PROT", "ALBUMIN" in the last 168 hours. No results for input(s): "LIPASE", "AMYLASE" in the last 168 hours. No results for input(s): "AMMONIA" in the last 168 hours.  CBC: Recent Labs  Lab 10/03/22 0155 10/04/22 0439 10/06/22 0404  WBC 8.4  --  6.2  HGB 7.2* 8.3* 8.2*  HCT 23.9* 27.9* 26.1*  MCV 104.8*  --  103.6*  PLT 247  --  204    Cardiac Enzymes: No results for input(s): "CKTOTAL", "CKMB", "CKMBINDEX", "TROPONINI" in the last 168 hours.  BNP (last 3 results) No results for  input(s): "BNP" in the last 8760 hours.  ProBNP (last 3 results) No results for input(s): "PROBNP" in the last 8760 hours.  Radiological Exams: No results found.  Assessment/Plan Active Problems:   Acute on chronic respiratory failure with hypoxia (HCC)   Multifocal pneumonia   Tracheostomy status (HCC)   Acute metabolic encephalopathy   COPD, severe (HCC)   Acute on chronic respiratory failure hypoxia we will continue to wean on pressure support goal of 16 hours Multifocal pneumonia has been treated with antibiotics we will continue to follow along Tracheostomy remains in place Metabolic encephalopathy no change we will continue to monitor Severe COPD medical management   I have personally seen and evaluated the patient, evaluated laboratory and imaging results, formulated the assessment and plan and placed orders. The Patient requires high complexity decision making with multiple systems involvement.  Rounds were done with the Respiratory Therapy Director and Staff therapists and discussed with nursing staff also.  [name], MD [facility] Pulmonary Critical Care Medicine Sleep Medicine

## 2022-10-08 ENCOUNTER — Other Ambulatory Visit (HOSPITAL_COMMUNITY): Payer: Medicare Other

## 2022-10-08 DIAGNOSIS — J449 Chronic obstructive pulmonary disease, unspecified: Secondary | ICD-10-CM | POA: Diagnosis not present

## 2022-10-08 DIAGNOSIS — G9341 Metabolic encephalopathy: Secondary | ICD-10-CM | POA: Diagnosis not present

## 2022-10-08 DIAGNOSIS — J189 Pneumonia, unspecified organism: Secondary | ICD-10-CM | POA: Diagnosis not present

## 2022-10-08 DIAGNOSIS — J9621 Acute and chronic respiratory failure with hypoxia: Secondary | ICD-10-CM | POA: Diagnosis not present

## 2022-10-08 LAB — BASIC METABOLIC PANEL
Anion gap: 8 (ref 5–15)
BUN: 47 mg/dL — ABNORMAL HIGH (ref 8–23)
CO2: 33 mmol/L — ABNORMAL HIGH (ref 22–32)
Calcium: 8.9 mg/dL (ref 8.9–10.3)
Chloride: 97 mmol/L — ABNORMAL LOW (ref 98–111)
Creatinine, Ser: 0.96 mg/dL (ref 0.44–1.00)
GFR, Estimated: >60 mL/min (ref >60)
Glucose, Bld: 151 mg/dL — ABNORMAL HIGH (ref 70–99)
Potassium: 4.9 mmol/L (ref 3.5–5.1)
Sodium: 138 mmol/L (ref 135–145)

## 2022-10-08 NOTE — Progress Notes (Signed)
Pulmonary Critical Care Medicine [facility] [place]   PULMONARY CRITICAL CARE SERVICE  PROGRESS NOTE     Sharlynn Allaway Bridges  ZOX:[id number]  DOB: [birth year]   DOA: 09/27/2022  Referring Physician: Luna Kitchens, MD  HPI: [name] is a 71 y.o. female being followed for ventilator/airway/oxygen weaning Acute on Chronic Respiratory Failure.  Patient is on the ventilator on assist control mode currently on 28% FiO2  Medications: Reviewed on Rounds  Physical Exam:  Vitals: Temperature is 97.5 pulse 73 respiratory 21 blood pressure is 143/74 saturations 98%  Ventilator Settings on assist control FiO2 28% tidal volume 310 PEEP of 5  General: Comfortable at this time Neck: supple Cardiovascular: no malignant arrhythmias Respiratory: No rhonchi no rales are noted at this time Skin: no rash seen on limited exam Musculoskeletal: No gross abnormality Psychiatric:unable to assess Neurologic:no involuntary movements         Lab Data:   Basic Metabolic Panel: Recent Labs  Lab 10/03/22 0155 10/06/22 0404 10/07/22 0016 10/07/22 2158 10/08/22 0248  NA 135 136  --   --  138  K 5.1 5.0  --   --  4.9  CL 99 99  --   --  97*  CO2 29 29  --   --  33*  GLUCOSE 288* 155*  --   --  151*  BUN 35* 53*  --   --  47*  CREATININE 1.16* 1.10*  --   --  0.96  CALCIUM 8.7* 8.4*  --   --  8.9  MG 1.9 1.7 1.8 2.1  --     ABG: No results for input(s): "PHART", "PCO2ART", "PO2ART", "HCO3", "O2SAT" in the last 168 hours.  Liver Function Tests: No results for input(s): "AST", "ALT", "ALKPHOS", "BILITOT", "PROT", "ALBUMIN" in the last 168 hours. No results for input(s): "LIPASE", "AMYLASE" in the last 168 hours. No results for input(s): "AMMONIA" in the last 168 hours.  CBC: Recent Labs  Lab 10/03/22 0155 10/04/22 0439 10/06/22 0404  WBC 8.4  --  6.2  HGB 7.2* 8.3* 8.2*  HCT 23.9* 27.9* 26.1*  MCV 104.8*  --  103.6*  PLT 247  --  204    Cardiac  Enzymes: No results for input(s): "CKTOTAL", "CKMB", "CKMBINDEX", "TROPONINI" in the last 168 hours.  BNP (last 3 results) No results for input(s): "BNP" in the last 8760 hours.  ProBNP (last 3 results) No results for input(s): "PROBNP" in the last 8760 hours.  Radiological Exams: No results found.  Assessment/Plan Active Problems:   Acute on chronic respiratory failure with hypoxia (HCC)   Multifocal pneumonia   Tracheostomy status (HCC)   Acute metabolic encephalopathy   COPD, severe (HCC)   Acute on chronic respiratory failure hypoxia plan is to continue with full support on mechanical ventilation.  Respiratory therapy continues to wean on pressure support as tolerated yesterday was supposed to do 16 hours we will continue to try to advance the pressure support Multifocal pneumonia has been treated we will continue to monitor Tracheostomy remains in place Metabolic encephalopathy no change we will continue to follow Severe COPD medical management   I have personally seen and evaluated the patient, evaluated laboratory and imaging results, formulated the assessment and plan and placed orders. The Patient requires high complexity decision making with multiple systems involvement.  Rounds were done with the Respiratory Therapy Director and Staff therapists and discussed with nursing staff also.  Yevonne Pax, MD [facility] Pulmonary Critical Care Medicine Sleep  Medicine

## 2022-10-09 DIAGNOSIS — J449 Chronic obstructive pulmonary disease, unspecified: Secondary | ICD-10-CM

## 2022-10-09 DIAGNOSIS — J189 Pneumonia, unspecified organism: Secondary | ICD-10-CM

## 2022-10-09 DIAGNOSIS — G9341 Metabolic encephalopathy: Secondary | ICD-10-CM

## 2022-10-09 DIAGNOSIS — Z930 Tracheostomy status: Secondary | ICD-10-CM

## 2022-10-09 DIAGNOSIS — J9621 Acute and chronic respiratory failure with hypoxia: Secondary | ICD-10-CM

## 2022-10-09 NOTE — Progress Notes (Signed)
Pulmonary Critical Care Medicine [facility] [place]   PULMONARY CRITICAL CARE SERVICE  PROGRESS NOTE     Miia Aburto [place]  KVQ:[id number]  DOB: [birth year]   DOA: 09/27/2022  Referring Physician: Luna Kitchens, MD  HPI: Kaziyah Pignotti [place] is a 71 y.o. female being followed for ventilator/airway/oxygen weaning Acute on Chronic Respiratory Failure.  Patient is on assist control currently on 28% FiO2 with good saturations  Medications: Reviewed on Rounds  Physical Exam:  Vitals: Temperature is 97.3 pulse of 76 respiratory rate is 28 blood pressure 109/55 saturations 100%  Ventilator Settings on assist-control FiO2 is 28% tidal volume 300 PEEP 5  General: Comfortable at this time Neck: supple Cardiovascular: no malignant arrhythmias Respiratory: Scattered rhonchi expansion is equal Skin: no rash seen on limited exam Musculoskeletal: No gross abnormality Psychiatric:unable to assess Neurologic:no involuntary movements         Lab Data:   Basic Metabolic Panel: Recent Labs  Lab 10/03/22 0155 10/06/22 0404 10/07/22 0016 10/07/22 2158 10/08/22 0248  NA 135 136  --   --  138  K 5.1 5.0  --   --  4.9  CL 99 99  --   --  97*  CO2 29 29  --   --  33*  GLUCOSE 288* 155*  --   --  151*  BUN 35* 53*  --   --  47*  CREATININE 1.16* 1.10*  --   --  0.96  CALCIUM 8.7* 8.4*  --   --  8.9  MG 1.9 1.7 1.8 2.1  --     ABG: No results for input(s): "PHART", "PCO2ART", "PO2ART", "HCO3", "O2SAT" in the last 168 hours.  Liver Function Tests: No results for input(s): "AST", "ALT", "ALKPHOS", "BILITOT", "PROT", "ALBUMIN" in the last 168 hours. No results for input(s): "LIPASE", "AMYLASE" in the last 168 hours. No results for input(s): "AMMONIA" in the last 168 hours.  CBC: Recent Labs  Lab 10/03/22 0155 10/04/22 0439 10/06/22 0404  WBC 8.4  --  6.2  HGB 7.2* 8.3* 8.2*  HCT 23.9* 27.9* 26.1*  MCV 104.8*  --  103.6*  PLT 247  --  204    Cardiac  Enzymes: No results for input(s): "CKTOTAL", "CKMB", "CKMBINDEX", "TROPONINI" in the last 168 hours.  BNP (last 3 results) No results for input(s): "BNP" in the last 8760 hours.  ProBNP (last 3 results) No results for input(s): "PROBNP" in the last 8760 hours.  Radiological Exams: DG Abd 1 View  Result Date: 10/08/2022 CLINICAL DATA:  Abdominal pain EXAM: ABDOMEN - 1 VIEW COMPARISON:  09/27/2022 FINDINGS: Examination is technically limited due to patient's body habitus. Bowel gas pattern is nonspecific. Percutaneous gastrostomy tube is seen in left upper abdomen. Skin staples are seen in right upper abdomen. Surgical clips are seen in gallbladder fossa and pelvis. IMPRESSION: Nonspecific bowel gas pattern. Electronically Signed   By: Ernie Avena M.D.   On: 10/08/2022 14:49    Assessment/Plan Active Problems:   Acute on chronic respiratory failure with hypoxia (HCC)   Multifocal pneumonia   Tracheostomy status (HCC)   Acute metabolic encephalopathy   COPD, severe (HCC)   Acute on chronic respiratory failure hypoxia we will continue with full support on assist control mode currently on 28% FiO2 saturations are good Multifocal pneumonia has been treated we will continue to follow along closely. Tracheostomy remains in place at this time Severe COPD medical management Metabolic encephalopathy no change   I have personally seen and evaluated  the patient, evaluated laboratory and imaging results, formulated the assessment and plan and placed orders. The Patient requires high complexity decision making with multiple systems involvement.  Rounds were done with the Respiratory Therapy Director and Staff therapists and discussed with nursing staff also.  Yevonne Pax, MD [facility] Pulmonary Critical Care Medicine Sleep Medicine

## 2022-10-10 DIAGNOSIS — G9341 Metabolic encephalopathy: Secondary | ICD-10-CM | POA: Diagnosis not present

## 2022-10-10 DIAGNOSIS — J9621 Acute and chronic respiratory failure with hypoxia: Secondary | ICD-10-CM | POA: Diagnosis not present

## 2022-10-10 DIAGNOSIS — J189 Pneumonia, unspecified organism: Secondary | ICD-10-CM | POA: Diagnosis not present

## 2022-10-10 DIAGNOSIS — J449 Chronic obstructive pulmonary disease, unspecified: Secondary | ICD-10-CM | POA: Diagnosis not present

## 2022-10-10 NOTE — Progress Notes (Signed)
Pulmonary [place]   PULMONARY CRITICAL CARE SERVICE  PROGRESS NOTE     [name]  IDP:[id number]  DOB: 10/12/1951   DOA: 09/27/2022  Referring Physician: Satira Sark, MD  HPI: [name] is a 71 y.o. female being followed for ventilator/airway/oxygen weaning Acute on Chronic Respiratory Failure.  Patient currently is on T collar on 28% FiO2 goal is for 8 hours  Medications: Reviewed on Rounds  Physical Exam:  Vitals: Temperature is 97.7 pulse 72 respiratory rate is 24 blood pressure 147/68 saturations 96%  Ventilator Settings T collar FiO2 28%  General: Comfortable at this time Neck: supple Cardiovascular: no malignant arrhythmias Respiratory: No rhonchi very coarse breath sounds Skin: no rash seen on limited exam Musculoskeletal: No gross abnormality Psychiatric:unable to assess Neurologic:no involuntary movements         Lab Data:   Basic Metabolic Panel: Recent Labs  Lab 10/06/22 0404 10/07/22 0016 10/07/22 2158 10/08/22 0248  NA 136  --   --  138  K 5.0  --   --  4.9  CL 99  --   --  97*  CO2 29  --   --  33*  GLUCOSE 155*  --   --  151*  BUN 53*  --   --  47*  CREATININE 1.10*  --   --  0.96  CALCIUM 8.4*  --   --  8.9  MG 1.7 1.8 2.1  --     ABG: No results for input(s): "PHART", "PCO2ART", "PO2ART", "HCO3", "O2SAT" in the last 168 hours.  Liver Function Tests: No results for input(s): "AST", "ALT", "ALKPHOS", "BILITOT", "PROT", "ALBUMIN" in the last 168 hours. No results for input(s): "LIPASE", "AMYLASE" in the last 168 hours. No results for input(s): "AMMONIA" in the last 168 hours.  CBC: Recent Labs  Lab 10/04/22 0439 10/06/22 0404  WBC  --  6.2  HGB 8.3* 8.2*  HCT 27.9* 26.1*  MCV  --  103.6*  PLT  --  204    Cardiac Enzymes: No results for input(s): "CKTOTAL", "CKMB", "CKMBINDEX", "TROPONINI" in the last 168 hours.  BNP (last 3 results) No results for  input(s): "BNP" in the last 8760 hours.  ProBNP (last 3 results) No results for input(s): "PROBNP" in the last 8760 hours.  Radiological Exams: No results found.  Assessment/Plan Active Problems:   Acute on chronic respiratory failure with hypoxia (HCC)   Multifocal pneumonia   Tracheostomy status (HCC)   Acute metabolic encephalopathy   COPD, severe (HCC)   Acute on chronic respiratory failure with hypoxia we will continue with the T collar trials patient currently is on 28% FiO2 with a goal of 8 hours Multifocal pneumonia has been treated with antibiotics we will continue to follow along Tracheostomy remains in place Metabolic encephalopathy no change overall Severe COPD medical management we will continue to follow   I have personally seen and evaluated the patient, evaluated laboratory and imaging results, formulated the assessment and plan and placed orders. The Patient requires high complexity decision making with multiple systems involvement.  Rounds were done with the Respiratory Therapy Director and Staff therapists and discussed with nursing staff also.  [name], MD [facility] Pulmonary Critical Care Medicine Sleep Medicine

## 2022-10-11 DIAGNOSIS — J189 Pneumonia, unspecified organism: Secondary | ICD-10-CM | POA: Diagnosis not present

## 2022-10-11 DIAGNOSIS — J9621 Acute and chronic respiratory failure with hypoxia: Secondary | ICD-10-CM | POA: Diagnosis not present

## 2022-10-11 DIAGNOSIS — G9341 Metabolic encephalopathy: Secondary | ICD-10-CM | POA: Diagnosis not present

## 2022-10-11 DIAGNOSIS — J449 Chronic obstructive pulmonary disease, unspecified: Secondary | ICD-10-CM | POA: Diagnosis not present

## 2022-10-11 LAB — COMPREHENSIVE METABOLIC PANEL
ALT: 22 U/L (ref 0–44)
AST: 25 U/L (ref 15–41)
Albumin: 2.1 g/dL — ABNORMAL LOW (ref 3.5–5.0)
Alkaline Phosphatase: 69 U/L (ref 38–126)
Anion gap: 8 (ref 5–15)
BUN: 39 mg/dL — ABNORMAL HIGH (ref 8–23)
CO2: 31 mmol/L (ref 22–32)
Calcium: 8.7 mg/dL — ABNORMAL LOW (ref 8.9–10.3)
Chloride: 96 mmol/L — ABNORMAL LOW (ref 98–111)
Creatinine, Ser: 0.9 mg/dL (ref 0.44–1.00)
GFR, Estimated: >60 mL/min (ref >60)
Glucose, Bld: 121 mg/dL — ABNORMAL HIGH (ref 70–99)
Potassium: 5.1 mmol/L (ref 3.5–5.1)
Sodium: 135 mmol/L (ref 135–145)
Total Bilirubin: 0.8 mg/dL (ref 0.3–1.2)
Total Protein: 5.5 g/dL — ABNORMAL LOW (ref 6.5–8.1)

## 2022-10-11 LAB — DIC (DISSEMINATED INTRAVASCULAR COAGULATION)PANEL
D-Dimer, Quant: 0.86 ug/mL-FEU — ABNORMAL HIGH (ref 0.00–0.50)
Fibrinogen: 511 mg/dL — ABNORMAL HIGH (ref 210–475)
INR: 1.1 (ref 0.8–1.2)
Platelets: 140 10*3/uL — ABNORMAL LOW (ref 150–400)
Prothrombin Time: 14.2 seconds (ref 11.4–15.2)
Smear Review: NONE SEEN
aPTT: 26 seconds (ref 24–36)

## 2022-10-11 LAB — CBC
HCT: 28.8 % — ABNORMAL LOW (ref 36.0–46.0)
HCT: 26.3 % — ABNORMAL LOW (ref 36.0–46.0)
Hemoglobin: 9.4 g/dL — ABNORMAL LOW (ref 12.0–15.0)
Hemoglobin: 8.1 g/dL — ABNORMAL LOW (ref 12.0–15.0)
MCH: 32.4 pg (ref 26.0–34.0)
MCH: 31.8 pg (ref 26.0–34.0)
MCHC: 32.6 g/dL (ref 30.0–36.0)
MCHC: 30.8 g/dL (ref 30.0–36.0)
MCV: 99.3 fL (ref 80.0–100.0)
MCV: 103.1 fL — ABNORMAL HIGH (ref 80.0–100.0)
Platelets: 77 10*3/uL — ABNORMAL LOW (ref 150–400)
Platelets: 134 10*3/uL — ABNORMAL LOW (ref 150–400)
RBC: 2.9 MIL/uL — ABNORMAL LOW (ref 3.87–5.11)
RBC: 2.55 MIL/uL — ABNORMAL LOW (ref 3.87–5.11)
RDW: 16.9 % — ABNORMAL HIGH (ref 11.5–15.5)
RDW: 16.9 % — ABNORMAL HIGH (ref 11.5–15.5)
WBC: 6.7 10*3/uL (ref 4.0–10.5)
WBC: 9.6 10*3/uL (ref 4.0–10.5)
nRBC: 0 % (ref 0.0–0.2)
nRBC: 0 % (ref 0.0–0.2)

## 2022-10-11 LAB — MAGNESIUM: Magnesium: 1.7 mg/dL (ref 1.7–2.4)

## 2022-10-11 NOTE — Progress Notes (Signed)
Pulmonary [place]   PULMONARY CRITICAL CARE SERVICE  PROGRESS NOTE     [name]  GXQ:[id number]  DOB: 17-Dec-1951   DOA: 09/27/2022  Referring Physician: Satira Sark, MD  HPI: [name] is a 71 y.o. female being followed for ventilator/airway/oxygen weaning Acute on Chronic Respiratory Failure.  Patient is currently on T collar on 28% FiO2 the goal is for 12 hours  Medications: Reviewed on Rounds  Physical Exam:  Vitals: Temperature is 97.5 pulse of 70 respiratory 21 blood pressure is 145/72 saturations 96%  Ventilator Settings on T collar FiO2 is 28%  General: Comfortable at this time Neck: supple Cardiovascular: no malignant arrhythmias Respiratory: No rhonchi no rales are noted at this time Skin: no rash seen on limited exam Musculoskeletal: No gross abnormality Psychiatric:unable to assess Neurologic:no involuntary movements         Lab Data:   Basic Metabolic Panel: Recent Labs  Lab 10/06/22 0404 10/07/22 0016 10/07/22 2158 10/08/22 0248 10/11/22 0131  NA 136  --   --  138 135  K 5.0  --   --  4.9 5.1  CL 99  --   --  97* 96*  CO2 29  --   --  33* 31  GLUCOSE 155*  --   --  151* 121*  BUN 53*  --   --  47* 39*  CREATININE 1.10*  --   --  0.96 0.90  CALCIUM 8.4*  --   --  8.9 8.7*  MG 1.7 1.8 2.1  --  1.7    ABG: No results for input(s): "PHART", "PCO2ART", "PO2ART", "HCO3", "O2SAT" in the last 168 hours.  Liver Function Tests: Recent Labs  Lab 10/11/22 0131  AST 25  ALT 22  ALKPHOS 69  BILITOT 0.8  PROT 5.5*  ALBUMIN 2.1*   No results for input(s): "LIPASE", "AMYLASE" in the last 168 hours. No results for input(s): "AMMONIA" in the last 168 hours.  CBC: Recent Labs  Lab 10/06/22 0404 10/11/22 0131  WBC 6.2 6.7  HGB 8.2* 9.4*  HCT 26.1* 28.8*  MCV 103.6* 99.3  PLT 204 77*    Cardiac Enzymes: No results for input(s): "CKTOTAL", "CKMB", "CKMBINDEX",  "TROPONINI" in the last 168 hours.  BNP (last 3 results) No results for input(s): "BNP" in the last 8760 hours.  ProBNP (last 3 results) No results for input(s): "PROBNP" in the last 8760 hours.  Radiological Exams: No results found.  Assessment/Plan Active Problems:   Acute on chronic respiratory failure with hypoxia (HCC)   Multifocal pneumonia   Tracheostomy status (HCC)   Acute metabolic encephalopathy   COPD, severe (HCC)   Acute on chronic respiratory failure hypoxia we will continue with T collar trials patient has goal of 12 hours Multifocal pneumonia has been treated we will continue to follow along closely Tracheostomy remains in place Metabolic encephalopathy no change Severe COPD medical management we will continue to follow along   I have personally seen and evaluated the patient, evaluated laboratory and imaging results, formulated the assessment and plan and placed orders. The Patient requires high complexity decision making with multiple systems involvement.  Rounds were done with the Respiratory Therapy Director and Staff therapists and discussed with nursing staff also.  [name], MD [facility] Pulmonary Critical Care Medicine Sleep Medicine

## 2022-10-12 DIAGNOSIS — J189 Pneumonia, unspecified organism: Secondary | ICD-10-CM | POA: Diagnosis not present

## 2022-10-12 DIAGNOSIS — G9341 Metabolic encephalopathy: Secondary | ICD-10-CM | POA: Diagnosis not present

## 2022-10-12 DIAGNOSIS — J9621 Acute and chronic respiratory failure with hypoxia: Secondary | ICD-10-CM | POA: Diagnosis not present

## 2022-10-12 DIAGNOSIS — J449 Chronic obstructive pulmonary disease, unspecified: Secondary | ICD-10-CM | POA: Diagnosis not present

## 2022-10-12 LAB — CBC
HCT: 23.8 % — ABNORMAL LOW (ref 36.0–46.0)
Hemoglobin: 7.4 g/dL — ABNORMAL LOW (ref 12.0–15.0)
MCH: 32.2 pg (ref 26.0–34.0)
MCHC: 31.1 g/dL (ref 30.0–36.0)
MCV: 103.5 fL — ABNORMAL HIGH (ref 80.0–100.0)
Platelets: 124 10*3/uL — ABNORMAL LOW (ref 150–400)
RBC: 2.3 MIL/uL — ABNORMAL LOW (ref 3.87–5.11)
RDW: 16.9 % — ABNORMAL HIGH (ref 11.5–15.5)
WBC: 8.9 10*3/uL (ref 4.0–10.5)
nRBC: 0 % (ref 0.0–0.2)

## 2022-10-12 LAB — BASIC METABOLIC PANEL
Anion gap: 5 (ref 5–15)
BUN: 39 mg/dL — ABNORMAL HIGH (ref 8–23)
CO2: 33 mmol/L — ABNORMAL HIGH (ref 22–32)
Calcium: 8.4 mg/dL — ABNORMAL LOW (ref 8.9–10.3)
Chloride: 97 mmol/L — ABNORMAL LOW (ref 98–111)
Creatinine, Ser: 0.97 mg/dL (ref 0.44–1.00)
GFR, Estimated: >60 mL/min (ref >60)
Glucose, Bld: 129 mg/dL — ABNORMAL HIGH (ref 70–99)
Potassium: 5.2 mmol/L — ABNORMAL HIGH (ref 3.5–5.1)
Sodium: 135 mmol/L (ref 135–145)

## 2022-10-12 LAB — MAGNESIUM: Magnesium: 1.9 mg/dL (ref 1.7–2.4)

## 2022-10-12 LAB — POTASSIUM: Potassium: 4.5 mmol/L (ref 3.5–5.1)

## 2022-10-12 LAB — OCCULT BLOOD X 1 CARD TO LAB, STOOL: Fecal Occult Bld: NEGATIVE

## 2022-10-12 NOTE — Progress Notes (Signed)
Pulmonary [place]   PULMONARY CRITICAL CARE SERVICE  PROGRESS NOTE     [name]  [id number]  DOB: 1951-01-14   DOA: 09/27/2022  Referring Physician: Satira Sark, MD  HPI: [name] is a 71 y.o. female being followed for ventilator/airway/oxygen weaning Acute on Chronic Respiratory Failure.  Patient is afebrile resting comfortably without distress  Medications: Reviewed on Rounds  Physical Exam:  Vitals: Temperature is 97.5 pulse 68 respiratory 17 blood pressure is 135/63 saturations 98%  Ventilator Settings pressure support FiO2 is 28% pressure 14/5  General: Comfortable at this time Neck: supple Cardiovascular: no malignant arrhythmias Respiratory: Scattered rhonchi expansion is equal Skin: no rash seen on limited exam Musculoskeletal: No gross abnormality Psychiatric:unable to assess Neurologic:no involuntary movements         Lab Data:   Basic Metabolic Panel: Recent Labs  Lab 10/06/22 0404 10/07/22 0016 10/07/22 2158 10/08/22 0248 10/11/22 0131 10/12/22 0132  NA 136  --   --  138 135 135  K 5.0  --   --  4.9 5.1 5.2*  CL 99  --   --  97* 96* 97*  CO2 29  --   --  33* 31 33*  GLUCOSE 155*  --   --  151* 121* 129*  BUN 53*  --   --  47* 39* 39*  CREATININE 1.10*  --   --  0.96 0.90 0.97  CALCIUM 8.4*  --   --  8.9 8.7* 8.4*  MG 1.7 1.8 2.1  --  1.7 1.9    ABG: No results for input(s): "PHART", "PCO2ART", "PO2ART", "HCO3", "O2SAT" in the last 168 hours.  Liver Function Tests: Recent Labs  Lab 10/11/22 0131  AST 25  ALT 22  ALKPHOS 69  BILITOT 0.8  PROT 5.5*  ALBUMIN 2.1*   No results for input(s): "LIPASE", "AMYLASE" in the last 168 hours. No results for input(s): "AMMONIA" in the last 168 hours.  CBC: Recent Labs  Lab 10/06/22 0404 10/11/22 0131 10/11/22 1626 10/11/22 2029 10/12/22 0132  WBC 6.2 6.7 9.6  --  8.9  HGB 8.2* 9.4* 8.1*  --  7.4*  HCT 26.1*  28.8* 26.3*  --  23.8*  MCV 103.6* 99.3 103.1*  --  103.5*  PLT 204 77* 134* 140* 124*    Cardiac Enzymes: No results for input(s): "CKTOTAL", "CKMB", "CKMBINDEX", "TROPONINI" in the last 168 hours.  BNP (last 3 results) No results for input(s): "BNP" in the last 8760 hours.  ProBNP (last 3 results) No results for input(s): "PROBNP" in the last 8760 hours.  Radiological Exams: No results found.  Assessment/Plan Active Problems:   Acute on chronic respiratory failure with hypoxia (HCC)   Multifocal pneumonia   Tracheostomy status (HCC)   Acute metabolic encephalopathy   COPD, severe (HCC)   Acute on chronic respiratory failure hypoxia we will continue with pressure support appears to be comfortable on current settings Multifocal pneumonia has been treated we will continue to monitor Tracheostomy remains in place Metabolic encephalopathy no change overall Severe COPD medical management   I have personally seen and evaluated the patient, evaluated laboratory and imaging results, formulated the assessment and plan and placed orders. The Patient requires high complexity decision making with multiple systems involvement.  Rounds were done with the Respiratory Therapy Director and Staff therapists and discussed with nursing staff also.  [name], MD [facility] Pulmonary Critical Care Medicine Sleep Medicine

## 2022-10-13 DIAGNOSIS — J449 Chronic obstructive pulmonary disease, unspecified: Secondary | ICD-10-CM | POA: Diagnosis not present

## 2022-10-13 DIAGNOSIS — J189 Pneumonia, unspecified organism: Secondary | ICD-10-CM | POA: Diagnosis not present

## 2022-10-13 DIAGNOSIS — J9621 Acute and chronic respiratory failure with hypoxia: Secondary | ICD-10-CM | POA: Diagnosis not present

## 2022-10-13 DIAGNOSIS — G9341 Metabolic encephalopathy: Secondary | ICD-10-CM | POA: Diagnosis not present

## 2022-10-13 LAB — BASIC METABOLIC PANEL
Anion gap: 8 (ref 5–15)
BUN: 44 mg/dL — ABNORMAL HIGH (ref 8–23)
CO2: 33 mmol/L — ABNORMAL HIGH (ref 22–32)
Calcium: 8.3 mg/dL — ABNORMAL LOW (ref 8.9–10.3)
Chloride: 94 mmol/L — ABNORMAL LOW (ref 98–111)
Creatinine, Ser: 1.08 mg/dL — ABNORMAL HIGH (ref 0.44–1.00)
GFR, Estimated: 55 mL/min — ABNORMAL LOW (ref >60)
Glucose, Bld: 112 mg/dL — ABNORMAL HIGH (ref 70–99)
Potassium: 4.8 mmol/L (ref 3.5–5.1)
Sodium: 135 mmol/L (ref 135–145)

## 2022-10-13 LAB — CBC
HCT: 22.9 % — ABNORMAL LOW (ref 36.0–46.0)
Hemoglobin: 7.3 g/dL — ABNORMAL LOW (ref 12.0–15.0)
MCH: 32.6 pg (ref 26.0–34.0)
MCHC: 31.9 g/dL (ref 30.0–36.0)
MCV: 102.2 fL — ABNORMAL HIGH (ref 80.0–100.0)
Platelets: 118 10*3/uL — ABNORMAL LOW (ref 150–400)
RBC: 2.24 MIL/uL — ABNORMAL LOW (ref 3.87–5.11)
RDW: 16.9 % — ABNORMAL HIGH (ref 11.5–15.5)
WBC: 9.8 10*3/uL (ref 4.0–10.5)
nRBC: 0 % (ref 0.0–0.2)

## 2022-10-13 LAB — OCCULT BLOOD X 1 CARD TO LAB, STOOL: Fecal Occult Bld: NEGATIVE

## 2022-10-13 LAB — HEPARIN INDUCED PLATELET AB (HIT ANTIBODY): Heparin Induced Plt Ab: 0.094 OD (ref 0.000–0.400)

## 2022-10-13 NOTE — Progress Notes (Signed)
Pulmonary Critical Care Medicine [facility]   PULMONARY CRITICAL CARE SERVICE  PROGRESS NOTE     [name]  [id number]:[id number]  DOB: 1951-07-12   DOA: 09/27/2022  Referring Physician: Luna Kitchens, MD  HPI: [name] is a 71 y.o. female being followed for ventilator/airway/oxygen weaning Acute on Chronic Respiratory Failure.  Patient currently this morning did 14 hours of T collar yesterday we will continue to try to advance to 16 hours today  Medications: Reviewed on Rounds  Physical Exam:  Vitals: Temperature is 97.5 pulse 72 respiratory 22 blood pressure is 105/55 saturations 96%  Ventilator Settings on T collar trials  General: Comfortable at this time Neck: supple Cardiovascular: no malignant arrhythmias Respiratory: Scattered rhonchi coarse breath sounds Skin: no rash seen on limited exam Musculoskeletal: No gross abnormality Psychiatric:unable to assess Neurologic:no involuntary movements         Lab Data:   Basic Metabolic Panel: Recent Labs  Lab 10/07/22 0016 10/07/22 2158 10/08/22 0248 10/11/22 0131 10/12/22 0132 10/12/22 1033 10/13/22 0137  NA  --   --  138 135 135  --  135  K  --   --  4.9 5.1 5.2* 4.5 4.8  CL  --   --  97* 96* 97*  --  94*  CO2  --   --  33* 31 33*  --  33*  GLUCOSE  --   --  151* 121* 129*  --  112*  BUN  --   --  47* 39* 39*  --  44*  CREATININE  --   --  0.96 0.90 0.97  --  1.08*  CALCIUM  --   --  8.9 8.7* 8.4*  --  8.3*  MG 1.8 2.1  --  1.7 1.9  --   --     ABG: No results for input(s): "PHART", "PCO2ART", "PO2ART", "HCO3", "O2SAT" in the last 168 hours.  Liver Function Tests: Recent Labs  Lab 10/11/22 0131  AST 25  ALT 22  ALKPHOS 69  BILITOT 0.8  PROT 5.5*  ALBUMIN 2.1*   No results for input(s): "LIPASE", "AMYLASE" in the last 168 hours. No results for input(s): "AMMONIA" in the last 168 hours.  CBC: Recent Labs  Lab 10/11/22 0131 10/11/22 1626 10/11/22 2029  10/12/22 0132 10/13/22 0137  WBC 6.7 9.6  --  8.9 9.8  HGB 9.4* 8.1*  --  7.4* 7.3*  HCT 28.8* 26.3*  --  23.8* 22.9*  MCV 99.3 103.1*  --  103.5* 102.2*  PLT 77* 134* 140* 124* 118*    Cardiac Enzymes: No results for input(s): "CKTOTAL", "CKMB", "CKMBINDEX", "TROPONINI" in the last 168 hours.  BNP (last 3 results) No results for input(s): "BNP" in the last 8760 hours.  ProBNP (last 3 results) No results for input(s): "PROBNP" in the last 8760 hours.  Radiological Exams: No results found.  Assessment/Plan Active Problems:   Acute on chronic respiratory failure with hypoxia (HCC)   Multifocal pneumonia   Tracheostomy status (HCC)   Acute metabolic encephalopathy   COPD, severe (HCC)   Acute on chronic respiratory failure hypoxia we will continue with T collar trials titrate oxygen as tolerated continue secretion management pulmonary toilet Multifocal pneumonia has been treated we will continue to follow along Tracheostomy remains in place Metabolic encephalopathy no change overall Severe COPD medical management   I have personally seen and evaluated the patient, evaluated laboratory and imaging results, formulated the assessment and plan and placed orders.  The Patient requires high complexity decision making with multiple systems involvement.  Rounds were done with the Respiratory Therapy Director and Staff therapists and discussed with nursing staff also.  Yevonne Pax, MD [facility] Pulmonary Critical Care Medicine Sleep Medicine

## 2022-10-14 DIAGNOSIS — J449 Chronic obstructive pulmonary disease, unspecified: Secondary | ICD-10-CM | POA: Diagnosis not present

## 2022-10-14 DIAGNOSIS — J189 Pneumonia, unspecified organism: Secondary | ICD-10-CM | POA: Diagnosis not present

## 2022-10-14 DIAGNOSIS — J9621 Acute and chronic respiratory failure with hypoxia: Secondary | ICD-10-CM | POA: Diagnosis not present

## 2022-10-14 DIAGNOSIS — G9341 Metabolic encephalopathy: Secondary | ICD-10-CM | POA: Diagnosis not present

## 2022-10-14 LAB — OCCULT BLOOD X 1 CARD TO LAB, STOOL: Fecal Occult Bld: NEGATIVE

## 2022-10-15 DIAGNOSIS — G9341 Metabolic encephalopathy: Secondary | ICD-10-CM | POA: Diagnosis not present

## 2022-10-15 DIAGNOSIS — J189 Pneumonia, unspecified organism: Secondary | ICD-10-CM | POA: Diagnosis not present

## 2022-10-15 DIAGNOSIS — J9621 Acute and chronic respiratory failure with hypoxia: Secondary | ICD-10-CM | POA: Diagnosis not present

## 2022-10-15 DIAGNOSIS — J449 Chronic obstructive pulmonary disease, unspecified: Secondary | ICD-10-CM | POA: Diagnosis not present

## 2022-10-15 LAB — BASIC METABOLIC PANEL
Anion gap: 7 (ref 5–15)
BUN: 44 mg/dL — ABNORMAL HIGH (ref 8–23)
CO2: 35 mmol/L — ABNORMAL HIGH (ref 22–32)
Calcium: 8.6 mg/dL — ABNORMAL LOW (ref 8.9–10.3)
Chloride: 96 mmol/L — ABNORMAL LOW (ref 98–111)
Creatinine, Ser: 0.94 mg/dL (ref 0.44–1.00)
GFR, Estimated: >60 mL/min (ref >60)
Glucose, Bld: 172 mg/dL — ABNORMAL HIGH (ref 70–99)
Potassium: 5.1 mmol/L (ref 3.5–5.1)
Sodium: 138 mmol/L (ref 135–145)

## 2022-10-15 LAB — CBC
HCT: 24.3 % — ABNORMAL LOW (ref 36.0–46.0)
Hemoglobin: 7.6 g/dL — ABNORMAL LOW (ref 12.0–15.0)
MCH: 32.8 pg (ref 26.0–34.0)
MCHC: 31.3 g/dL (ref 30.0–36.0)
MCV: 104.7 fL — ABNORMAL HIGH (ref 80.0–100.0)
Platelets: 115 10*3/uL — ABNORMAL LOW (ref 150–400)
RBC: 2.32 MIL/uL — ABNORMAL LOW (ref 3.87–5.11)
RDW: 17.1 % — ABNORMAL HIGH (ref 11.5–15.5)
WBC: 9.7 10*3/uL (ref 4.0–10.5)
nRBC: 0 % (ref 0.0–0.2)

## 2022-10-15 NOTE — Progress Notes (Cosign Needed)
Pulmonary [place]   PULMONARY CRITICAL CARE SERVICE  PROGRESS NOTE     [name]  YTK:[id number]  DOB: July 05, 1951   DOA: 09/27/2022  Referring Physician: Satira Sark, MD  HPI: [name] is a 71 y.o. female being followed for ventilator/airway/oxygen weaning Acute on Chronic Respiratory Failure.  Patient seen lying in bed, currently on ATC 28%.  Completed 16-hour of ATC yesterday, has a goal of 20-hour ATC today.  Medications: Reviewed on Rounds  Physical Exam:  Vitals:  Temp 97.6, pulse 67, respirations 18, BP 107/50, SPO2 100%  Ventilator Settings ATC 28%  General: Comfortable at this time Neck: supple Cardiovascular: no malignant arrhythmias Respiratory: Bilaterally clear Skin: no rash seen on limited exam Musculoskeletal: No gross abnormality Psychiatric:unable to assess Neurologic:no involuntary movements         Lab Data:   Basic Metabolic Panel: Recent Labs  Lab 10/11/22 0131 10/12/22 0132 10/12/22 1033 10/13/22 0137 10/15/22 0136  NA 135 135  --  135 138  K 5.1 5.2* 4.5 4.8 5.1  CL 96* 97*  --  94* 96*  CO2 31 33*  --  33* 35*  GLUCOSE 121* 129*  --  112* 172*  BUN 39* 39*  --  44* 44*  CREATININE 0.90 0.97  --  1.08* 0.94  CALCIUM 8.7* 8.4*  --  8.3* 8.6*  MG 1.7 1.9  --   --   --     ABG: No results for input(s): "PHART", "PCO2ART", "PO2ART", "HCO3", "O2SAT" in the last 168 hours.  Liver Function Tests: Recent Labs  Lab 10/11/22 0131  AST 25  ALT 22  ALKPHOS 69  BILITOT 0.8  PROT 5.5*  ALBUMIN 2.1*   No results for input(s): "LIPASE", "AMYLASE" in the last 168 hours. No results for input(s): "AMMONIA" in the last 168 hours.  CBC: Recent Labs  Lab 10/11/22 0131 10/11/22 1626 10/11/22 2029 10/12/22 0132 10/13/22 0137 10/15/22 0136  WBC 6.7 9.6  --  8.9 9.8 9.7  HGB 9.4* 8.1*  --  7.4* 7.3* 7.6*  HCT 28.8* 26.3*  --  23.8* 22.9* 24.3*  MCV 99.3 103.1*  --   103.5* 102.2* 104.7*  PLT 77* 134* 140* 124* 118* 115*    Cardiac Enzymes: No results for input(s): "CKTOTAL", "CKMB", "CKMBINDEX", "TROPONINI" in the last 168 hours.  BNP (last 3 results) No results for input(s): "BNP" in the last 8760 hours.  ProBNP (last 3 results) No results for input(s): "PROBNP" in the last 8760 hours.  Radiological Exams: No results found.  Assessment/Plan Active Problems:   Acute on chronic respiratory failure with hypoxia (HCC)   Multifocal pneumonia   Tracheostomy status (HCC)   Acute metabolic encephalopathy   COPD, severe (HCC)  Acute on chronic respiratory failure hypoxia-currently on ATC 28%.  Successfully completed 16 hours of ATC yesterday, is working on a 20-hour ATC goal today. Multifocal pneumonia-has been treated.  We will continue to follow. Tracheostomy-remains stable in place.  Continue with trach care per protocol. Metabolic encephalopathy-no overall change noted. Severe COPD-continue with medical management.   I have personally seen and evaluated the patient, evaluated laboratory and imaging results, formulated the assessment and plan and placed orders. The Patient requires high complexity decision making with multiple systems involvement.  Rounds were done with the Respiratory Therapy Director and Staff therapists and discussed with nursing staff also.  [name], MD [facility] Pulmonary Critical Care Medicine Sleep Medicine

## 2022-10-15 NOTE — Progress Notes (Addendum)
Pulmonary [place]   PULMONARY CRITICAL CARE SERVICE  PROGRESS NOTE     [name]  EHU:[id number]  DOB: July 28, 1951   DOA: 09/27/2022  Referring Physician: Satira Sark, MD  HPI: [name] is a 71 y.o. female being followed for ventilator/airway/oxygen weaning Acute on Chronic Respiratory Failure.  Patient seen lying in bed, currently remains on ATC.  She did successfully complete 20-hour ATC goal yesterday was placed back on ATC today at 10:10 AM and is working towards a 24-hour ATC goal.  Medications: Reviewed on Rounds  Physical Exam:  Vitals: Temp 96.5, pulse 79, respirations 15, BP 140/74, SPO2 98%  Ventilator Settings ATC 28%  General: Comfortable at this time Neck: supple Cardiovascular: no malignant arrhythmias Respiratory: Laterally clear Skin: no rash seen on limited exam Musculoskeletal: No gross abnormality Psychiatric:unable to assess Neurologic:no involuntary movements         Lab Data:   Basic Metabolic Panel: Recent Labs  Lab 10/11/22 0131 10/12/22 0132 10/12/22 1033 10/13/22 0137 10/15/22 0136  NA 135 135  --  135 138  K 5.1 5.2* 4.5 4.8 5.1  CL 96* 97*  --  94* 96*  CO2 31 33*  --  33* 35*  GLUCOSE 121* 129*  --  112* 172*  BUN 39* 39*  --  44* 44*  CREATININE 0.90 0.97  --  1.08* 0.94  CALCIUM 8.7* 8.4*  --  8.3* 8.6*  MG 1.7 1.9  --   --   --     ABG: No results for input(s): "PHART", "PCO2ART", "PO2ART", "HCO3", "O2SAT" in the last 168 hours.  Liver Function Tests: Recent Labs  Lab 10/11/22 0131  AST 25  ALT 22  ALKPHOS 69  BILITOT 0.8  PROT 5.5*  ALBUMIN 2.1*   No results for input(s): "LIPASE", "AMYLASE" in the last 168 hours. No results for input(s): "AMMONIA" in the last 168 hours.  CBC: Recent Labs  Lab 10/11/22 0131 10/11/22 1626 10/11/22 2029 10/12/22 0132 10/13/22 0137 10/15/22 0136  WBC 6.7 9.6  --  8.9 9.8 9.7  HGB 9.4* 8.1*  --  7.4*  7.3* 7.6*  HCT 28.8* 26.3*  --  23.8* 22.9* 24.3*  MCV 99.3 103.1*  --  103.5* 102.2* 104.7*  PLT 77* 134* 140* 124* 118* 115*    Cardiac Enzymes: No results for input(s): "CKTOTAL", "CKMB", "CKMBINDEX", "TROPONINI" in the last 168 hours.  BNP (last 3 results) No results for input(s): "BNP" in the last 8760 hours.  ProBNP (last 3 results) No results for input(s): "PROBNP" in the last 8760 hours.  Radiological Exams: No results found.  Assessment/Plan Active Problems:   Acute on chronic respiratory failure with hypoxia (HCC)   Multifocal pneumonia   Tracheostomy status (HCC)   Acute metabolic encephalopathy   COPD, severe (HCC)  Acute on chronic respiratory failure hypoxia-currently on ATC 28%.  Successfully completed 20 hours of ATC yesterday, is working on a 24-hour ATC goal today. Multifocal pneumonia-has been treated.  We will continue to follow. Tracheostomy-remains stable in place.  Continue with trach care per protocol. Metabolic encephalopathy-no overall change noted. Severe COPD-continue with medical management.   I have personally seen and evaluated the patient, evaluated laboratory and imaging results, formulated the assessment and plan and placed orders. The Patient requires high complexity decision making with multiple systems involvement.  Rounds were done with the Respiratory Therapy Director and Staff therapists and discussed with nursing staff also.  [name],  MD [facility] Pulmonary Critical Care Medicine Sleep Medicine

## 2022-10-16 DIAGNOSIS — J9621 Acute and chronic respiratory failure with hypoxia: Secondary | ICD-10-CM | POA: Diagnosis not present

## 2022-10-16 DIAGNOSIS — G9341 Metabolic encephalopathy: Secondary | ICD-10-CM | POA: Diagnosis not present

## 2022-10-16 DIAGNOSIS — J189 Pneumonia, unspecified organism: Secondary | ICD-10-CM | POA: Diagnosis not present

## 2022-10-16 DIAGNOSIS — J449 Chronic obstructive pulmonary disease, unspecified: Secondary | ICD-10-CM | POA: Diagnosis not present

## 2022-10-16 NOTE — Progress Notes (Cosign Needed)
Pulmonary [place]   PULMONARY CRITICAL CARE SERVICE  PROGRESS NOTE     [name]  OVF:[id number]  DOB: Aug 16, 1951   DOA: 09/27/2022  Referring Physician: Satira Sark, MD  HPI: [name] is a 71 y.o. female being followed for ventilator/airway/oxygen weaning Acute on Chronic Respiratory Failure.  Patient seen lying in bed, is currently reattempting a 24-hour ATC goal.  Wilburn Mylar was able to complete only 5 out of 24 hours.  Medications: Reviewed on Rounds  Physical Exam:  Vitals: Temp 97.1, pulse 64, respirations 11, BP 121/102, SPO2 100%  Ventilator Settings ATC 28%  General: Comfortable at this time Neck: supple Cardiovascular: no malignant arrhythmias Respiratory: Bilaterally diminished Skin: no rash seen on limited exam Musculoskeletal: No gross abnormality Psychiatric:unable to assess Neurologic:no involuntary movements         Lab Data:   Basic Metabolic Panel: Recent Labs  Lab 10/11/22 0131 10/12/22 0132 10/12/22 1033 10/13/22 0137 10/15/22 0136  NA 135 135  --  135 138  K 5.1 5.2* 4.5 4.8 5.1  CL 96* 97*  --  94* 96*  CO2 31 33*  --  33* 35*  GLUCOSE 121* 129*  --  112* 172*  BUN 39* 39*  --  44* 44*  CREATININE 0.90 0.97  --  1.08* 0.94  CALCIUM 8.7* 8.4*  --  8.3* 8.6*  MG 1.7 1.9  --   --   --     ABG: No results for input(s): "PHART", "PCO2ART", "PO2ART", "HCO3", "O2SAT" in the last 168 hours.  Liver Function Tests: Recent Labs  Lab 10/11/22 0131  AST 25  ALT 22  ALKPHOS 69  BILITOT 0.8  PROT 5.5*  ALBUMIN 2.1*   No results for input(s): "LIPASE", "AMYLASE" in the last 168 hours. No results for input(s): "AMMONIA" in the last 168 hours.  CBC: Recent Labs  Lab 10/11/22 0131 10/11/22 1626 10/11/22 2029 10/12/22 0132 10/13/22 0137 10/15/22 0136  WBC 6.7 9.6  --  8.9 9.8 9.7  HGB 9.4* 8.1*  --  7.4* 7.3* 7.6*  HCT 28.8* 26.3*  --  23.8* 22.9* 24.3*  MCV  99.3 103.1*  --  103.5* 102.2* 104.7*  PLT 77* 134* 140* 124* 118* 115*    Cardiac Enzymes: No results for input(s): "CKTOTAL", "CKMB", "CKMBINDEX", "TROPONINI" in the last 168 hours.  BNP (last 3 results) No results for input(s): "BNP" in the last 8760 hours.  ProBNP (last 3 results) No results for input(s): "PROBNP" in the last 8760 hours.  Radiological Exams: No results found.  Assessment/Plan Active Problems:   Acute on chronic respiratory failure with hypoxia (HCC)   Multifocal pneumonia   Tracheostomy status (HCC)   Acute metabolic encephalopathy   COPD, severe (HCC)  Acute on chronic respiratory failure hypoxia-currently on ATC 28%.  Reattempting a second trial of 24-hour ATC goal today. Multifocal pneumonia-has been treated.  We will continue to follow. Tracheostomy-remains stable in place.  Continue with trach care per protocol. Metabolic encephalopathy-no overall change noted. Severe COPD-continue with medical management.  I have personally seen and evaluated the patient, evaluated laboratory and imaging results, formulated the assessment and plan and placed orders. The Patient requires high complexity decision making with multiple systems involvement.  Rounds were done with the Respiratory Therapy Director and Staff therapists and discussed with nursing staff also.  [name], MD [facility] Pulmonary Critical Care Medicine Sleep Medicine

## 2022-10-17 DIAGNOSIS — G9341 Metabolic encephalopathy: Secondary | ICD-10-CM | POA: Diagnosis not present

## 2022-10-17 DIAGNOSIS — J449 Chronic obstructive pulmonary disease, unspecified: Secondary | ICD-10-CM | POA: Diagnosis not present

## 2022-10-17 DIAGNOSIS — J189 Pneumonia, unspecified organism: Secondary | ICD-10-CM | POA: Diagnosis not present

## 2022-10-17 DIAGNOSIS — J9621 Acute and chronic respiratory failure with hypoxia: Secondary | ICD-10-CM | POA: Diagnosis not present

## 2022-10-17 NOTE — Progress Notes (Addendum)
Pulmonary [place]   PULMONARY CRITICAL CARE SERVICE  PROGRESS NOTE     Candra Wegner Bridges  [id number]:[id number]  DOB: 27-Dec-1951   DOA: 09/27/2022  Referring Physician: Satira Sark, MD  HPI: [name] is a 71 y.o. female being followed for ventilator/airway/oxygen weaning Acute on Chronic Respiratory Failure.  Patient seen lying in bed, currently on pressure support 12/5 28%.  Unfortunately was able to complete only 1.75 hours of 24-hour ATC goal yesterday.  Patient states because she is anxious and feels like she is going to die.  Medications: Reviewed on Rounds  Physical Exam:  Vitals: Temp 98.7, pulse 65, respirations 27, BP 122/66, SPO2 100%  Ventilator Settings pressure support 12/5 FiO2 28%  General: Comfortable at this time Neck: supple Cardiovascular: no malignant arrhythmias Respiratory: Bilaterally diminished Skin: no rash seen on limited exam Musculoskeletal: No gross abnormality Psychiatric:unable to assess Neurologic:no involuntary movements         Lab Data:   Basic Metabolic Panel: Recent Labs  Lab 10/11/22 0131 10/12/22 0132 10/12/22 1033 10/13/22 0137 10/15/22 0136  NA 135 135  --  135 138  K 5.1 5.2* 4.5 4.8 5.1  CL 96* 97*  --  94* 96*  CO2 31 33*  --  33* 35*  GLUCOSE 121* 129*  --  112* 172*  BUN 39* 39*  --  44* 44*  CREATININE 0.90 0.97  --  1.08* 0.94  CALCIUM 8.7* 8.4*  --  8.3* 8.6*  MG 1.7 1.9  --   --   --     ABG: No results for input(s): "PHART", "PCO2ART", "PO2ART", "HCO3", "O2SAT" in the last 168 hours.  Liver Function Tests: Recent Labs  Lab 10/11/22 0131  AST 25  ALT 22  ALKPHOS 69  BILITOT 0.8  PROT 5.5*  ALBUMIN 2.1*   No results for input(s): "LIPASE", "AMYLASE" in the last 168 hours. No results for input(s): "AMMONIA" in the last 168 hours.  CBC: Recent Labs  Lab 10/11/22 0131 10/11/22 1626 10/11/22 2029 10/12/22 0132 10/13/22 0137  10/15/22 0136  WBC 6.7 9.6  --  8.9 9.8 9.7  HGB 9.4* 8.1*  --  7.4* 7.3* 7.6*  HCT 28.8* 26.3*  --  23.8* 22.9* 24.3*  MCV 99.3 103.1*  --  103.5* 102.2* 104.7*  PLT 77* 134* 140* 124* 118* 115*    Cardiac Enzymes: No results for input(s): "CKTOTAL", "CKMB", "CKMBINDEX", "TROPONINI" in the last 168 hours.  BNP (last 3 results) No results for input(s): "BNP" in the last 8760 hours.  ProBNP (last 3 results) No results for input(s): "PROBNP" in the last 8760 hours.  Radiological Exams: No results found.  Assessment/Plan Active Problems:   Acute on chronic respiratory failure with hypoxia (HCC)   Multifocal pneumonia   Tracheostomy status (HCC)   Acute metabolic encephalopathy   COPD, severe ([place])   Acute on chronic respiratory failure hypoxia-currently on pressure support 12/5 28%.  Will be reattempting a third time of 24-hour ATC goal today. Multifocal pneumonia-has been treated.  We will continue to follow. Tracheostomy-remains stable in place.  Continue with trach care per protocol. Metabolic encephalopathy-no overall change noted. Severe COPD-continue with medical management.   I have personally seen and evaluated the patient, evaluated laboratory and imaging results, formulated the assessment and plan and placed orders. The Patient requires high complexity decision making with multiple systems involvement.  Rounds were done with the Respiratory Therapy Director and Staff therapists and discussed with  nursing staff also.  [name], MD [facility] Pulmonary Critical Care Medicine Sleep Medicine

## 2022-10-18 ENCOUNTER — Other Ambulatory Visit (HOSPITAL_COMMUNITY): Payer: Medicare Other

## 2022-10-18 DIAGNOSIS — J449 Chronic obstructive pulmonary disease, unspecified: Secondary | ICD-10-CM | POA: Diagnosis not present

## 2022-10-18 DIAGNOSIS — G9341 Metabolic encephalopathy: Secondary | ICD-10-CM | POA: Diagnosis not present

## 2022-10-18 DIAGNOSIS — J9621 Acute and chronic respiratory failure with hypoxia: Secondary | ICD-10-CM | POA: Diagnosis not present

## 2022-10-18 DIAGNOSIS — J189 Pneumonia, unspecified organism: Secondary | ICD-10-CM | POA: Diagnosis not present

## 2022-10-18 LAB — CBC
HCT: 28 % — ABNORMAL LOW (ref 36.0–46.0)
Hemoglobin: 8.3 g/dL — ABNORMAL LOW (ref 12.0–15.0)
MCH: 32.5 pg (ref 26.0–34.0)
MCHC: 29.6 g/dL — ABNORMAL LOW (ref 30.0–36.0)
MCV: 109.8 fL — ABNORMAL HIGH (ref 80.0–100.0)
Platelets: 136 10*3/uL — ABNORMAL LOW (ref 150–400)
RBC: 2.55 MIL/uL — ABNORMAL LOW (ref 3.87–5.11)
RDW: 17.7 % — ABNORMAL HIGH (ref 11.5–15.5)
WBC: 16.2 10*3/uL — ABNORMAL HIGH (ref 4.0–10.5)
nRBC: 0 % (ref 0.0–0.2)

## 2022-10-18 LAB — BASIC METABOLIC PANEL
Anion gap: 9 (ref 5–15)
BUN: 59 mg/dL — ABNORMAL HIGH (ref 8–23)
CO2: 34 mmol/L — ABNORMAL HIGH (ref 22–32)
Calcium: 8.9 mg/dL (ref 8.9–10.3)
Chloride: 93 mmol/L — ABNORMAL LOW (ref 98–111)
Creatinine, Ser: 0.93 mg/dL (ref 0.44–1.00)
GFR, Estimated: >60 mL/min (ref >60)
Glucose, Bld: 158 mg/dL — ABNORMAL HIGH (ref 70–99)
Potassium: 6 mmol/L — ABNORMAL HIGH (ref 3.5–5.1)
Sodium: 136 mmol/L (ref 135–145)

## 2022-10-18 LAB — POTASSIUM: Potassium: 5.5 mmol/L — ABNORMAL HIGH (ref 3.5–5.1)

## 2022-10-18 NOTE — Progress Notes (Addendum)
Pulmonary Critical Care Medicine [facility] [place]   PULMONARY CRITICAL CARE SERVICE  PROGRESS NOTE     [name]  ZOX:[id number]  DOB: 1951/08/18   DOA: 09/27/2022  Referring Physician: Luna Kitchens, MD  HPI: [name] is a 71 y.o. female being followed for ventilator/airway/oxygen weaning Acute on Chronic Respiratory Failure.  Patient seen lying in bed, currently is on ATC 40%.  She is reattempting her 48-hour goal.  She does remain slightly anxious however her SPO2 was greater than 96%.  Medications: Reviewed on Rounds  Physical Exam:  Vitals: Temp 97.4, pulse 80, respirations 27, BP 148/73, SPO2 97%  Ventilator Settings ATC 40%  General: Comfortable at this time Neck: supple Cardiovascular: no malignant arrhythmias Respiratory: Bilaterally diminished Skin: no rash seen on limited exam Musculoskeletal: No gross abnormality Psychiatric:unable to assess Neurologic:no involuntary movements         Lab Data:   Basic Metabolic Panel: Recent Labs  Lab 10/12/22 0132 10/12/22 1033 10/13/22 0137 10/15/22 0136 10/18/22 0138 10/18/22 1241  NA 135  --  135 138 136  --   K 5.2* 4.5 4.8 5.1 6.0* 5.5*  CL 97*  --  94* 96* 93*  --   CO2 33*  --  33* 35* 34*  --   GLUCOSE 129*  --  112* 172* 158*  --   BUN 39*  --  44* 44* 59*  --   CREATININE 0.97  --  1.08* 0.94 0.93  --   CALCIUM 8.4*  --  8.3* 8.6* 8.9  --   MG 1.9  --   --   --   --   --     ABG: No results for input(s): "PHART", "PCO2ART", "PO2ART", "HCO3", "O2SAT" in the last 168 hours.  Liver Function Tests: No results for input(s): "AST", "ALT", "ALKPHOS", "BILITOT", "PROT", "ALBUMIN" in the last 168 hours. No results for input(s): "LIPASE", "AMYLASE" in the last 168 hours. No results for input(s): "AMMONIA" in the last 168 hours.  CBC: Recent Labs  Lab 10/11/22 2029 10/12/22 0132 10/13/22 0137 10/15/22 0136 10/18/22 0138  WBC  --  8.9 9.8 9.7 16.2*  HGB  --   7.4* 7.3* 7.6* 8.3*  HCT  --  23.8* 22.9* 24.3* 28.0*  MCV  --  103.5* 102.2* 104.7* 109.8*  PLT 140* 124* 118* 115* 136*    Cardiac Enzymes: No results for input(s): "CKTOTAL", "CKMB", "CKMBINDEX", "TROPONINI" in the last 168 hours.  BNP (last 3 results) No results for input(s): "BNP" in the last 8760 hours.  ProBNP (last 3 results) No results for input(s): "PROBNP" in the last 8760 hours.  Radiological Exams: DG Chest Port 1 View  Result Date: 10/18/2022 CLINICAL DATA:  [id number] Elevated WBC count [id number] EXAM: PORTABLE CHEST 1 VIEW COMPARISON:  Radiograph 10/01/2022 FINDINGS: Unchanged enlarged cardiac silhouette. There are diffuse interstitial and alveolar opacities bilaterally, right greater than left. Small bilateral pleural effusions. No evidence of pneumothorax. No acute osseous abnormality. Thoracic spondylosis. IMPRESSION: Cardiomegaly with persistent pulmonary edema and small pleural effusions. Electronically Signed   By: Caprice Renshaw M.D.   On: 10/18/2022 11:58    Assessment/Plan Active Problems:   Acute on chronic respiratory failure with hypoxia (HCC)   Multifocal pneumonia   Tracheostomy status (HCC)   Acute metabolic encephalopathy   COPD, severe (HCC)   Acute on chronic respiratory failure hypoxia-currently on ATC 40%.  Will be reattempting a second day of 48-hour goal. Multifocal pneumonia-has been treated.  We  will continue to follow. Tracheostomy-remains stable in place.  Continue with trach care per protocol. Metabolic encephalopathy-no overall change noted. Severe COPD-continue with medical management.   I have personally seen and evaluated the patient, evaluated laboratory and imaging results, formulated the assessment and plan and placed orders. The Patient requires high complexity decision making with multiple systems involvement.  Rounds were done with the Respiratory Therapy Director and Staff therapists and discussed with nursing staff also.  Yevonne Pax, MD [facility] Pulmonary Critical Care Medicine Sleep Medicine

## 2022-10-19 DIAGNOSIS — J9621 Acute and chronic respiratory failure with hypoxia: Secondary | ICD-10-CM | POA: Diagnosis not present

## 2022-10-19 DIAGNOSIS — J189 Pneumonia, unspecified organism: Secondary | ICD-10-CM | POA: Diagnosis not present

## 2022-10-19 DIAGNOSIS — J449 Chronic obstructive pulmonary disease, unspecified: Secondary | ICD-10-CM | POA: Diagnosis not present

## 2022-10-19 DIAGNOSIS — G9341 Metabolic encephalopathy: Secondary | ICD-10-CM | POA: Diagnosis not present

## 2022-10-20 DIAGNOSIS — G9341 Metabolic encephalopathy: Secondary | ICD-10-CM | POA: Diagnosis not present

## 2022-10-20 DIAGNOSIS — J189 Pneumonia, unspecified organism: Secondary | ICD-10-CM | POA: Diagnosis not present

## 2022-10-20 DIAGNOSIS — J449 Chronic obstructive pulmonary disease, unspecified: Secondary | ICD-10-CM | POA: Diagnosis not present

## 2022-10-20 DIAGNOSIS — J9621 Acute and chronic respiratory failure with hypoxia: Secondary | ICD-10-CM | POA: Diagnosis not present

## 2022-10-20 LAB — BASIC METABOLIC PANEL
Anion gap: 10 (ref 5–15)
BUN: 61 mg/dL — ABNORMAL HIGH (ref 8–23)
CO2: 38 mmol/L — ABNORMAL HIGH (ref 22–32)
Calcium: 8.9 mg/dL (ref 8.9–10.3)
Chloride: 93 mmol/L — ABNORMAL LOW (ref 98–111)
Creatinine, Ser: 1.03 mg/dL — ABNORMAL HIGH (ref 0.44–1.00)
GFR, Estimated: 58 mL/min — ABNORMAL LOW (ref >60)
Glucose, Bld: 142 mg/dL — ABNORMAL HIGH (ref 70–99)
Potassium: 4.8 mmol/L (ref 3.5–5.1)
Sodium: 141 mmol/L (ref 135–145)

## 2022-10-20 LAB — CBC
HCT: 24 % — ABNORMAL LOW (ref 36.0–46.0)
Hemoglobin: 7.3 g/dL — ABNORMAL LOW (ref 12.0–15.0)
MCH: 32.3 pg (ref 26.0–34.0)
MCHC: 30.4 g/dL (ref 30.0–36.0)
MCV: 106.2 fL — ABNORMAL HIGH (ref 80.0–100.0)
Platelets: 87 10*3/uL — ABNORMAL LOW (ref 150–400)
RBC: 2.26 MIL/uL — ABNORMAL LOW (ref 3.87–5.11)
RDW: 17.3 % — ABNORMAL HIGH (ref 11.5–15.5)
WBC: 6.3 10*3/uL (ref 4.0–10.5)
nRBC: 0 % (ref 0.0–0.2)

## 2022-10-20 LAB — MAGNESIUM: Magnesium: 2 mg/dL (ref 1.7–2.4)

## 2022-10-20 NOTE — Progress Notes (Cosign Needed)
Pulmonary [place]   PULMONARY CRITICAL CARE SERVICE  PROGRESS NOTE     [name]  UYQ:[id number]  DOB: May 27, 1951   DOA: 09/27/2022  Referring Physician: Satira Sark, MD  HPI: [name] is a 71 y.o. female being followed for ventilator/airway/oxygen weaning Acute on Chronic Respiratory Failure.  Patient seen lying in bed, was able to complete only 4 out of 24-hour ATC goal yesterday before becoming tachycardic and tachypneic.  Will reattempt 24-hour ATC goal today.  Medications: Reviewed on Rounds  Physical Exam:  Vitals: Temp 97.8, pulse 69, respirations 21, BP 119/52, SPO2 95%  Ventilator Settings ATC 35%  General: Comfortable at this time Neck: supple Cardiovascular: no malignant arrhythmias Respiratory: Bilaterally diminished Skin: no rash seen on limited exam Musculoskeletal: No gross abnormality Psychiatric:unable to assess Neurologic:no involuntary movements         Lab Data:   Basic Metabolic Panel: Recent Labs  Lab 10/15/22 0136 10/18/22 0138 10/18/22 1241 10/20/22 0336  NA 138 136  --  141  K 5.1 6.0* 5.5* 4.8  CL 96* 93*  --  93*  CO2 35* 34*  --  38*  GLUCOSE 172* 158*  --  142*  BUN 44* 59*  --  61*  CREATININE 0.94 0.93  --  1.03*  CALCIUM 8.6* 8.9  --  8.9  MG  --   --   --  2.0    ABG: No results for input(s): "PHART", "PCO2ART", "PO2ART", "HCO3", "O2SAT" in the last 168 hours.  Liver Function Tests: No results for input(s): "AST", "ALT", "ALKPHOS", "BILITOT", "PROT", "ALBUMIN" in the last 168 hours. No results for input(s): "LIPASE", "AMYLASE" in the last 168 hours. No results for input(s): "AMMONIA" in the last 168 hours.  CBC: Recent Labs  Lab 10/15/22 0136 10/18/22 0138 10/20/22 0336  WBC 9.7 16.2* 6.3  HGB 7.6* 8.3* 7.3*  HCT 24.3* 28.0* 24.0*  MCV 104.7* 109.8* 106.2*  PLT 115* 136* 87*    Cardiac Enzymes: No results for input(s): "CKTOTAL",  "CKMB", "CKMBINDEX", "TROPONINI" in the last 168 hours.  BNP (last 3 results) No results for input(s): "BNP" in the last 8760 hours.  ProBNP (last 3 results) No results for input(s): "PROBNP" in the last 8760 hours.  Radiological Exams: No results found.  Assessment/Plan Active Problems:   Acute on chronic respiratory failure with hypoxia (HCC)   Multifocal pneumonia   Tracheostomy status (HCC)   Acute metabolic encephalopathy   COPD, severe (HCC)   Acute on chronic respiratory failure hypoxia-unfortunately was able to complete only 4 out of 24-hour ATC goal yesterday, will reattempt 24-hour ATC goal today. Multifocal pneumonia-has been treated.  We will continue to follow. Tracheostomy-remains stable in place.  Continue with trach care per protocol. Metabolic encephalopathy-no overall change noted. Severe COPD-continue with medical management.   I have personally seen and evaluated the patient, evaluated laboratory and imaging results, formulated the assessment and plan and placed orders. The Patient requires high complexity decision making with multiple systems involvement.  Rounds were done with the Respiratory Therapy Director and Staff therapists and discussed with nursing staff also.  [name], MD [facility] Pulmonary Critical Care Medicine Sleep Medicine

## 2022-10-20 NOTE — Progress Notes (Cosign Needed)
Pulmonary [place]   PULMONARY CRITICAL CARE SERVICE  PROGRESS NOTE     [name]  IOX:[id number]  DOB: March 22, 1951   DOA: 09/27/2022  Referring Physician: Satira Sark, MD  HPI: [name] is a 71 y.o. female being followed for ventilator/airway/oxygen weaning Acute on Chronic Respiratory Failure.  Patient seen lying in bed currently on full support ventilation with FiO2 needs at 40%.  Medications: Reviewed on Rounds  Physical Exam:  Vitals: Temp 96.8, pulse 70, respirations 21, BP 123/41, SPO2 100%  Ventilator Settings AC VC plus, FiO2 40%, tidal volume 300, rate 18, PEEP 5  General: Comfortable at this time Neck: supple Cardiovascular: no malignant arrhythmias Respiratory: Bilaterally diminished Skin: no rash seen on limited exam Musculoskeletal: No gross abnormality Psychiatric:unable to assess Neurologic:no involuntary movements         Lab Data:   Basic Metabolic Panel: Recent Labs  Lab 10/15/22 0136 10/18/22 0138 10/18/22 1241 10/20/22 0336  NA 138 136  --  141  K 5.1 6.0* 5.5* 4.8  CL 96* 93*  --  93*  CO2 35* 34*  --  38*  GLUCOSE 172* 158*  --  142*  BUN 44* 59*  --  61*  CREATININE 0.94 0.93  --  1.03*  CALCIUM 8.6* 8.9  --  8.9  MG  --   --   --  2.0    ABG: No results for input(s): "PHART", "PCO2ART", "PO2ART", "HCO3", "O2SAT" in the last 168 hours.  Liver Function Tests: No results for input(s): "AST", "ALT", "ALKPHOS", "BILITOT", "PROT", "ALBUMIN" in the last 168 hours. No results for input(s): "LIPASE", "AMYLASE" in the last 168 hours. No results for input(s): "AMMONIA" in the last 168 hours.  CBC: Recent Labs  Lab 10/15/22 0136 10/18/22 0138 10/20/22 0336  WBC 9.7 16.2* 6.3  HGB 7.6* 8.3* 7.3*  HCT 24.3* 28.0* 24.0*  MCV 104.7* 109.8* 106.2*  PLT 115* 136* 87*    Cardiac Enzymes: No results for input(s): "CKTOTAL", "CKMB", "CKMBINDEX", "TROPONINI" in the  last 168 hours.  BNP (last 3 results) No results for input(s): "BNP" in the last 8760 hours.  ProBNP (last 3 results) No results for input(s): "PROBNP" in the last 8760 hours.  Radiological Exams: No results found.  Assessment/Plan Active Problems:   Acute on chronic respiratory failure with hypoxia (HCC)   Multifocal pneumonia   Tracheostomy status (HCC)   Acute metabolic encephalopathy   COPD, severe (HCC)   Acute on chronic respiratory failure hypoxia-currently on full support ventilation.  Continue with ATC as patient tolerates. Multifocal pneumonia-has been treated.  We will continue to follow. Tracheostomy-remains stable in place.  Continue with trach care per protocol. Metabolic encephalopathy-no overall change noted. Severe COPD-continue with medical management.  I have personally seen and evaluated the patient, evaluated laboratory and imaging results, formulated the assessment and plan and placed orders. The Patient requires high complexity decision making with multiple systems involvement.  Rounds were done with the Respiratory Therapy Director and Staff therapists and discussed with nursing staff also.  [name], MD [facility] Pulmonary Critical Care Medicine Sleep Medicine

## 2022-10-21 DIAGNOSIS — J449 Chronic obstructive pulmonary disease, unspecified: Secondary | ICD-10-CM | POA: Diagnosis not present

## 2022-10-21 DIAGNOSIS — J9621 Acute and chronic respiratory failure with hypoxia: Secondary | ICD-10-CM | POA: Diagnosis not present

## 2022-10-21 DIAGNOSIS — G9341 Metabolic encephalopathy: Secondary | ICD-10-CM | POA: Diagnosis not present

## 2022-10-21 DIAGNOSIS — J189 Pneumonia, unspecified organism: Secondary | ICD-10-CM | POA: Diagnosis not present

## 2022-10-21 NOTE — Progress Notes (Signed)
Pulmonary [place]   PULMONARY CRITICAL CARE SERVICE  PROGRESS NOTE     [name]  [id number]  DOB: Aug 10, 1951   DOA: 09/27/2022  Referring Physician: Satira Sark, MD  HPI: [name] is a 71 y.o. female being followed for ventilator/airway/oxygen weaning Acute on Chronic Respiratory Failure.  Patient currently is on T collar on 35% FiO2 completing 24 hours will be going for 48 hours  Medications: Reviewed on Rounds  Physical Exam:  Vitals: Temperature 96.6 pulse of 82 respiratory rate is 28 blood pressure 158/74 saturations 94%  Ventilator Settings on T collar FiO2 35%  General: Comfortable at this time Neck: supple Cardiovascular: no malignant arrhythmias Respiratory: Scattered rhonchi expansion is equal Skin: no rash seen on limited exam Musculoskeletal: No gross abnormality Psychiatric:unable to assess Neurologic:no involuntary movements         Lab Data:   Basic Metabolic Panel: Recent Labs  Lab 10/15/22 0136 10/18/22 0138 10/18/22 1241 10/20/22 0336  NA 138 136  --  141  K 5.1 6.0* 5.5* 4.8  CL 96* 93*  --  93*  CO2 35* 34*  --  38*  GLUCOSE 172* 158*  --  142*  BUN 44* 59*  --  61*  CREATININE 0.94 0.93  --  1.03*  CALCIUM 8.6* 8.9  --  8.9  MG  --   --   --  2.0    ABG: No results for input(s): "PHART", "PCO2ART", "PO2ART", "HCO3", "O2SAT" in the last 168 hours.  Liver Function Tests: No results for input(s): "AST", "ALT", "ALKPHOS", "BILITOT", "PROT", "ALBUMIN" in the last 168 hours. No results for input(s): "LIPASE", "AMYLASE" in the last 168 hours. No results for input(s): "AMMONIA" in the last 168 hours.  CBC: Recent Labs  Lab 10/15/22 0136 10/18/22 0138 10/20/22 0336  WBC 9.7 16.2* 6.3  HGB 7.6* 8.3* 7.3*  HCT 24.3* 28.0* 24.0*  MCV 104.7* 109.8* 106.2*  PLT 115* 136* 87*    Cardiac Enzymes: No results for input(s): "CKTOTAL", "CKMB", "CKMBINDEX",  "TROPONINI" in the last 168 hours.  BNP (last 3 results) No results for input(s): "BNP" in the last 8760 hours.  ProBNP (last 3 results) No results for input(s): "PROBNP" in the last 8760 hours.  Radiological Exams: No results found.  Assessment/Plan Active Problems:   Acute on chronic respiratory failure with hypoxia (HCC)   Multifocal pneumonia   Tracheostomy status (HCC)   Acute metabolic encephalopathy   COPD, severe (HCC)   Acute on chronic respiratory failure hypoxia on 35% FiO2 saturations are good Multifocal pneumonia has been treated we will continue to follow along closely Tracheostomy remains in place Metabolic encephalopathy no change Severe COPD medical management   I have personally seen and evaluated the patient, evaluated laboratory and imaging results, formulated the assessment and plan and placed orders. The Patient requires high complexity decision making with multiple systems involvement.  Rounds were done with the Respiratory Therapy Director and Staff therapists and discussed with nursing staff also.  [name], MD [facility] Pulmonary Critical Care Medicine Sleep Medicine

## 2022-10-22 DIAGNOSIS — J449 Chronic obstructive pulmonary disease, unspecified: Secondary | ICD-10-CM | POA: Diagnosis not present

## 2022-10-22 DIAGNOSIS — J9621 Acute and chronic respiratory failure with hypoxia: Secondary | ICD-10-CM | POA: Diagnosis not present

## 2022-10-22 DIAGNOSIS — G9341 Metabolic encephalopathy: Secondary | ICD-10-CM | POA: Diagnosis not present

## 2022-10-22 DIAGNOSIS — J189 Pneumonia, unspecified organism: Secondary | ICD-10-CM | POA: Diagnosis not present

## 2022-10-22 NOTE — Progress Notes (Signed)
Pulmonary [place]   PULMONARY CRITICAL CARE SERVICE  PROGRESS NOTE     [name]  YQM:[id number]  DOB: May 06, 1951   DOA: 09/27/2022  Referring Physician: Satira Sark, MD  HPI: [name] is a 71 y.o. female being followed for ventilator/airway/oxygen weaning Acute on Chronic Respiratory Failure.  Patient was able to do 24 hours of T collar but had to go back on the ventilator overnight.  A lot of issues with anxiety going on  Medications: Reviewed on Rounds  Physical Exam:  Vitals: Temperature is 97.3 pulse 61 respiratory rate 18 blood pressure is 114/55 saturations 99%  Ventilator Settings assist-control FiO2 is 30% tidal volume is 300 with a PEEP of 5  General: Comfortable at this time Neck: supple Cardiovascular: no malignant arrhythmias Respiratory: Scattered rhonchi expansion is equal at this time Skin: no rash seen on limited exam Musculoskeletal: No gross abnormality Psychiatric:unable to assess Neurologic:no involuntary movements         Lab Data:   Basic Metabolic Panel: Recent Labs  Lab 10/18/22 0138 10/18/22 1241 10/20/22 0336  NA 136  --  141  K 6.0* 5.5* 4.8  CL 93*  --  93*  CO2 34*  --  38*  GLUCOSE 158*  --  142*  BUN 59*  --  61*  CREATININE 0.93  --  1.03*  CALCIUM 8.9  --  8.9  MG  --   --  2.0    ABG: No results for input(s): "PHART", "PCO2ART", "PO2ART", "HCO3", "O2SAT" in the last 168 hours.  Liver Function Tests: No results for input(s): "AST", "ALT", "ALKPHOS", "BILITOT", "PROT", "ALBUMIN" in the last 168 hours. No results for input(s): "LIPASE", "AMYLASE" in the last 168 hours. No results for input(s): "AMMONIA" in the last 168 hours.  CBC: Recent Labs  Lab 10/18/22 0138 10/20/22 0336  WBC 16.2* 6.3  HGB 8.3* 7.3*  HCT 28.0* 24.0*  MCV 109.8* 106.2*  PLT 136* 87*    Cardiac Enzymes: No results for input(s): "CKTOTAL", "CKMB", "CKMBINDEX",  "TROPONINI" in the last 168 hours.  BNP (last 3 results) No results for input(s): "BNP" in the last 8760 hours.  ProBNP (last 3 results) No results for input(s): "PROBNP" in the last 8760 hours.  Radiological Exams: No results found.  Assessment/Plan Active Problems:   Acute on chronic respiratory failure with hypoxia (HCC)   Multifocal pneumonia   Tracheostomy status (HCC)   Acute metabolic encephalopathy   COPD, severe (HCC)   Acute on chronic respiratory failure hypoxia we will try to wean back on T collar once again.  She needs reassurance as her anxiety issues are playing a big role in her weaning Multifocal pneumonia has been treated with antibiotics we will continue to follow along closely. Metabolic encephalopathy no change we will continue to follow Severe COPD medical management Tracheostomy remains in place at this time   I have personally seen and evaluated the patient, evaluated laboratory and imaging results, formulated the assessment and plan and placed orders. The Patient requires high complexity decision making with multiple systems involvement.  Rounds were done with the Respiratory Therapy Director and Staff therapists and discussed with nursing staff also.  [name], MD [facility] Pulmonary Critical Care Medicine Sleep Medicine

## 2022-10-23 DIAGNOSIS — J189 Pneumonia, unspecified organism: Secondary | ICD-10-CM | POA: Diagnosis not present

## 2022-10-23 DIAGNOSIS — G9341 Metabolic encephalopathy: Secondary | ICD-10-CM | POA: Diagnosis not present

## 2022-10-23 DIAGNOSIS — J9621 Acute and chronic respiratory failure with hypoxia: Secondary | ICD-10-CM | POA: Diagnosis not present

## 2022-10-23 DIAGNOSIS — J449 Chronic obstructive pulmonary disease, unspecified: Secondary | ICD-10-CM | POA: Diagnosis not present

## 2022-10-23 LAB — CBC
HCT: 22.2 % — ABNORMAL LOW (ref 36.0–46.0)
Hemoglobin: 7.1 g/dL — ABNORMAL LOW (ref 12.0–15.0)
MCH: 33.6 pg (ref 26.0–34.0)
MCHC: 32 g/dL (ref 30.0–36.0)
MCV: 105.2 fL — ABNORMAL HIGH (ref 80.0–100.0)
Platelets: 94 10*3/uL — ABNORMAL LOW (ref 150–400)
RBC: 2.11 MIL/uL — ABNORMAL LOW (ref 3.87–5.11)
RDW: 17.3 % — ABNORMAL HIGH (ref 11.5–15.5)
WBC: 6.7 10*3/uL (ref 4.0–10.5)
nRBC: 0 % (ref 0.0–0.2)

## 2022-10-23 LAB — BASIC METABOLIC PANEL
Anion gap: 5 (ref 5–15)
BUN: 42 mg/dL — ABNORMAL HIGH (ref 8–23)
CO2: 35 mmol/L — ABNORMAL HIGH (ref 22–32)
Calcium: 8.5 mg/dL — ABNORMAL LOW (ref 8.9–10.3)
Chloride: 95 mmol/L — ABNORMAL LOW (ref 98–111)
Creatinine, Ser: 0.95 mg/dL (ref 0.44–1.00)
GFR, Estimated: >60 mL/min (ref >60)
Glucose, Bld: 133 mg/dL — ABNORMAL HIGH (ref 70–99)
Potassium: 4.6 mmol/L (ref 3.5–5.1)
Sodium: 135 mmol/L (ref 135–145)

## 2022-10-24 DIAGNOSIS — G9341 Metabolic encephalopathy: Secondary | ICD-10-CM | POA: Diagnosis not present

## 2022-10-24 DIAGNOSIS — J9621 Acute and chronic respiratory failure with hypoxia: Secondary | ICD-10-CM | POA: Diagnosis not present

## 2022-10-24 DIAGNOSIS — J189 Pneumonia, unspecified organism: Secondary | ICD-10-CM | POA: Diagnosis not present

## 2022-10-24 DIAGNOSIS — J449 Chronic obstructive pulmonary disease, unspecified: Secondary | ICD-10-CM | POA: Diagnosis not present

## 2022-10-24 LAB — BLOOD GAS, ARTERIAL
Acid-Base Excess: 13.2 mmol/L — ABNORMAL HIGH (ref 0.0–2.0)
Bicarbonate: 39.6 mmol/L — ABNORMAL HIGH (ref 20.0–28.0)
O2 Saturation: 98.4 %
Patient temperature: 37
pCO2 arterial: 64 mmHg — ABNORMAL HIGH (ref 32–48)
pH, Arterial: 7.4 (ref 7.35–7.45)
pO2, Arterial: 132 mmHg — ABNORMAL HIGH (ref 83–108)

## 2022-10-24 LAB — CBC
HCT: 22.7 % — ABNORMAL LOW (ref 36.0–46.0)
Hemoglobin: 7.1 g/dL — ABNORMAL LOW (ref 12.0–15.0)
MCH: 32.9 pg (ref 26.0–34.0)
MCHC: 31.3 g/dL (ref 30.0–36.0)
MCV: 105.1 fL — ABNORMAL HIGH (ref 80.0–100.0)
Platelets: 100 10*3/uL — ABNORMAL LOW (ref 150–400)
RBC: 2.16 MIL/uL — ABNORMAL LOW (ref 3.87–5.11)
RDW: 17.2 % — ABNORMAL HIGH (ref 11.5–15.5)
WBC: 6.7 10*3/uL (ref 4.0–10.5)
nRBC: 0 % (ref 0.0–0.2)

## 2022-10-24 NOTE — Progress Notes (Signed)
Pulmonary [place]   PULMONARY CRITICAL CARE SERVICE  PROGRESS NOTE     [name]  UQJ:[id number]  DOB: 07/15/1951   DOA: 09/27/2022  Referring Physician: Satira Sark, MD  HPI: [name] is a 71 y.o. female being followed for ventilator/airway/oxygen weaning Acute on Chronic Respiratory Failure.  Patient is currently on T collar has been on wean goal of 24 hours  Medications: Reviewed on Rounds  Physical Exam:  Vitals: Temperature is 97.2 pulse 62 respiratory 21 blood pressure is 141/65 saturations 100%  Ventilator Settings on T collar off the vent  General: Comfortable at this time Neck: supple Cardiovascular: no malignant arrhythmias Respiratory: No rhonchi no rales are noted at this time Skin: no rash seen on limited exam Musculoskeletal: No gross abnormality Psychiatric:unable to assess Neurologic:no involuntary movements         Lab Data:   Basic Metabolic Panel: Recent Labs  Lab 10/18/22 0138 10/18/22 1241 10/20/22 0336 10/23/22 0122  NA 136  --  141 135  K 6.0* 5.5* 4.8 4.6  CL 93*  --  93* 95*  CO2 34*  --  38* 35*  GLUCOSE 158*  --  142* 133*  BUN 59*  --  61* 42*  CREATININE 0.93  --  1.03* 0.95  CALCIUM 8.9  --  8.9 8.5*  MG  --   --  2.0  --     ABG: No results for input(s): "PHART", "PCO2ART", "PO2ART", "HCO3", "O2SAT" in the last 168 hours.  Liver Function Tests: No results for input(s): "AST", "ALT", "ALKPHOS", "BILITOT", "PROT", "ALBUMIN" in the last 168 hours. No results for input(s): "LIPASE", "AMYLASE" in the last 168 hours. No results for input(s): "AMMONIA" in the last 168 hours.  CBC: Recent Labs  Lab 10/18/22 0138 10/20/22 0336 10/23/22 0122 10/24/22 0036  WBC 16.2* 6.3 6.7 6.7  HGB 8.3* 7.3* 7.1* 7.1*  HCT 28.0* 24.0* 22.2* 22.7*  MCV 109.8* 106.2* 105.2* 105.1*  PLT 136* 87* 94* 100*    Cardiac Enzymes: No results for input(s): "CKTOTAL",  "CKMB", "CKMBINDEX", "TROPONINI" in the last 168 hours.  BNP (last 3 results) No results for input(s): "BNP" in the last 8760 hours.  ProBNP (last 3 results) No results for input(s): "PROBNP" in the last 8760 hours.  Radiological Exams: No results found.  Assessment/Plan Active Problems:   Acute on chronic respiratory failure with hypoxia (HCC)   Multifocal pneumonia   Tracheostomy status (HCC)   Acute metabolic encephalopathy   COPD, severe (HCC)   Acute on chronic respiratory failure hypoxia we will continue with the T-piece wean continue secretion management supportive care Multifocal pneumonia has been treated we will continue to follow along closely Tracheostomy remains in place Metabolic encephalopathy no change overall Severe COPD medical management we will continue to follow along   I have personally seen and evaluated the patient, evaluated laboratory and imaging results, formulated the assessment and plan and placed orders. The Patient requires high complexity decision making with multiple systems involvement.  Rounds were done with the Respiratory Therapy Director and Staff therapists and discussed with nursing staff also.  [name], MD [facility] Pulmonary Critical Care Medicine Sleep Medicine

## 2022-10-25 ENCOUNTER — Other Ambulatory Visit (HOSPITAL_COMMUNITY): Payer: Medicare Other

## 2022-10-25 DIAGNOSIS — G9341 Metabolic encephalopathy: Secondary | ICD-10-CM | POA: Diagnosis not present

## 2022-10-25 DIAGNOSIS — J449 Chronic obstructive pulmonary disease, unspecified: Secondary | ICD-10-CM | POA: Diagnosis not present

## 2022-10-25 DIAGNOSIS — J9621 Acute and chronic respiratory failure with hypoxia: Secondary | ICD-10-CM | POA: Diagnosis not present

## 2022-10-25 DIAGNOSIS — J189 Pneumonia, unspecified organism: Secondary | ICD-10-CM | POA: Diagnosis not present

## 2022-10-25 LAB — BASIC METABOLIC PANEL
Anion gap: 8 (ref 5–15)
BUN: 39 mg/dL — ABNORMAL HIGH (ref 8–23)
CO2: 35 mmol/L — ABNORMAL HIGH (ref 22–32)
Calcium: 9.1 mg/dL (ref 8.9–10.3)
Chloride: 95 mmol/L — ABNORMAL LOW (ref 98–111)
Creatinine, Ser: 0.89 mg/dL (ref 0.44–1.00)
GFR, Estimated: >60 mL/min (ref >60)
Glucose, Bld: 107 mg/dL — ABNORMAL HIGH (ref 70–99)
Potassium: 4.7 mmol/L (ref 3.5–5.1)
Sodium: 138 mmol/L (ref 135–145)

## 2022-10-25 LAB — CBC
HCT: 24.5 % — ABNORMAL LOW (ref 36.0–46.0)
Hemoglobin: 7.7 g/dL — ABNORMAL LOW (ref 12.0–15.0)
MCH: 33.2 pg (ref 26.0–34.0)
MCHC: 31.4 g/dL (ref 30.0–36.0)
MCV: 105.6 fL — ABNORMAL HIGH (ref 80.0–100.0)
Platelets: 113 10*3/uL — ABNORMAL LOW (ref 150–400)
RBC: 2.32 MIL/uL — ABNORMAL LOW (ref 3.87–5.11)
RDW: 17.1 % — ABNORMAL HIGH (ref 11.5–15.5)
WBC: 7.1 10*3/uL (ref 4.0–10.5)
nRBC: 0 % (ref 0.0–0.2)

## 2022-10-25 NOTE — Progress Notes (Signed)
Pulmonary Critical Care Medicine [facility] [place]   PULMONARY CRITICAL CARE SERVICE  PROGRESS NOTE     Emmarie Overman Ellis-Hill  WUJ:[id number]  DOB: 1951/03/21   DOA: 09/27/2022  Referring Physician: Luna Kitchens, MD  HPI: [place] Askari Ellis-Hill is a 71 y.o. female being followed for ventilator/airway/oxygen weaning Acute on Chronic Respiratory Failure.  Currently on T collar did about 24 hours going for 48 hours now  Medications: Reviewed on Rounds  Physical Exam:  Vitals: Temperature is 97.2 pulse 64 respiratory 22 blood pressure 139/68 saturations 99%  Ventilator Settings off the ventilator on T collar  General: Comfortable at this time Neck: supple Cardiovascular: no malignant arrhythmias Respiratory: No rhonchi no rales are noted at this time Skin: no rash seen on limited exam Musculoskeletal: No gross abnormality Psychiatric:unable to assess Neurologic:no involuntary movements         Lab Data:   Basic Metabolic Panel: Recent Labs  Lab 10/18/22 1241 10/20/22 0336 10/23/22 0122 10/25/22 0139  NA  --  141 135 138  K 5.5* 4.8 4.6 4.7  CL  --  93* 95* 95*  CO2  --  38* 35* 35*  GLUCOSE  --  142* 133* 107*  BUN  --  61* 42* 39*  CREATININE  --  1.03* 0.95 0.89  CALCIUM  --  8.9 8.5* 9.1  MG  --  2.0  --   --     ABG: Recent Labs  Lab 10/24/22 1053  PHART 7.4  PCO2ART 64*  PO2ART 132*  HCO3 39.6*  O2SAT 98.4    Liver Function Tests: No results for input(s): "AST", "ALT", "ALKPHOS", "BILITOT", "PROT", "ALBUMIN" in the last 168 hours. No results for input(s): "LIPASE", "AMYLASE" in the last 168 hours. No results for input(s): "AMMONIA" in the last 168 hours.  CBC: Recent Labs  Lab 10/20/22 0336 10/23/22 0122 10/24/22 0036 10/25/22 0139  WBC 6.3 6.7 6.7 7.1  HGB 7.3* 7.1* 7.1* 7.7*  HCT 24.0* 22.2* 22.7* 24.5*  MCV 106.2* 105.2* 105.1* 105.6*  PLT 87* 94* 100* 113*    Cardiac Enzymes: No results for input(s): "CKTOTAL",  "CKMB", "CKMBINDEX", "TROPONINI" in the last 168 hours.  BNP (last 3 results) No results for input(s): "BNP" in the last 8760 hours.  ProBNP (last 3 results) No results for input(s): "PROBNP" in the last 8760 hours.  Radiological Exams: DG Chest Port 1 View  Result Date: 10/25/2022 CLINICAL DATA:  Pulmonary edema. EXAM: PORTABLE CHEST 1 VIEW COMPARISON:  10/18/2022 FINDINGS: Tracheostomy tube is identified with tip above the carina. Unchanged cardiomediastinal contours. Mild diffuse increase interstitial markings compatible with interstitial edema. More focal opacification within the right upper lobe is noted which may reflect airspace disease versus asymmetric edema. IMPRESSION: 1. Mild diffuse interstitial edema. 2. New, more focal opacification within the right upper lobe may reflect airspace disease versus asymmetric edema. Electronically Signed   By: Signa Kell M.D.   On: 10/25/2022 06:59    Assessment/Plan Active Problems:   Acute on chronic respiratory failure with hypoxia (HCC)   Multifocal pneumonia   Tracheostomy status (HCC)   Acute metabolic encephalopathy   COPD, severe (HCC)   Acute on chronic respiratory failure hypoxia we will continue with weaning on T collar goal 48 hours Multifocal pneumonia has been treated we will continue to monitor Tracheostomy remains in place Metabolic encephalopathy no change Severe COPD medical management   I have personally seen and evaluated the patient, evaluated laboratory and imaging results, formulated the assessment  and plan and placed orders. The Patient requires high complexity decision making with multiple systems involvement.  Rounds were done with the Respiratory Therapy Director and Staff therapists and discussed with nursing staff also.  Yevonne Pax, MD [facility] Pulmonary Critical Care Medicine Sleep Medicine

## 2022-10-26 DIAGNOSIS — G9341 Metabolic encephalopathy: Secondary | ICD-10-CM | POA: Diagnosis not present

## 2022-10-26 DIAGNOSIS — J9621 Acute and chronic respiratory failure with hypoxia: Secondary | ICD-10-CM | POA: Diagnosis not present

## 2022-10-26 DIAGNOSIS — J449 Chronic obstructive pulmonary disease, unspecified: Secondary | ICD-10-CM | POA: Diagnosis not present

## 2022-10-26 DIAGNOSIS — J189 Pneumonia, unspecified organism: Secondary | ICD-10-CM | POA: Diagnosis not present

## 2022-10-26 NOTE — Progress Notes (Signed)
Pulmonary Critical Care Medicine [facility] [place]   PULMONARY CRITICAL CARE SERVICE  PROGRESS NOTE     Julysa Reidinger Ellis-Hill  KYH:[id number]  DOB: 10-31-1951   DOA: 09/27/2022  Referring Physician: Luna Kitchens, MD  HPI: Layan Derflinger Ellis-Hill is a 71 y.o. female being followed for ventilator/airway/oxygen weaning Acute on Chronic Respiratory Failure.  At this time patient is on T collar currently on 35% FiO2 saturations are good  Medications: Reviewed on Rounds  Physical Exam:  Vitals: Temperature is 97.4 pulse 72 respiratory 26 blood pressure is 129/75 saturations 99%  Ventilator Settings T collar FiO2 is 35%  General: Comfortable at this time Neck: supple Cardiovascular: no malignant arrhythmias Respiratory: Scattered rhonchi expansion is equal Skin: no rash seen on limited exam Musculoskeletal: No gross abnormality Psychiatric:unable to assess Neurologic:no involuntary movements         Lab Data:   Basic Metabolic Panel: Recent Labs  Lab 10/20/22 0336 10/23/22 0122 10/25/22 0139  NA 141 135 138  K 4.8 4.6 4.7  CL 93* 95* 95*  CO2 38* 35* 35*  GLUCOSE 142* 133* 107*  BUN 61* 42* 39*  CREATININE 1.03* 0.95 0.89  CALCIUM 8.9 8.5* 9.1  MG 2.0  --   --     ABG: Recent Labs  Lab 10/24/22 1053  PHART 7.4  PCO2ART 64*  PO2ART 132*  HCO3 39.6*  O2SAT 98.4    Liver Function Tests: No results for input(s): "AST", "ALT", "ALKPHOS", "BILITOT", "PROT", "ALBUMIN" in the last 168 hours. No results for input(s): "LIPASE", "AMYLASE" in the last 168 hours. No results for input(s): "AMMONIA" in the last 168 hours.  CBC: Recent Labs  Lab 10/20/22 0336 10/23/22 0122 10/24/22 0036 10/25/22 0139  WBC 6.3 6.7 6.7 7.1  HGB 7.3* 7.1* 7.1* 7.7*  HCT 24.0* 22.2* 22.7* 24.5*  MCV 106.2* 105.2* 105.1* 105.6*  PLT 87* 94* 100* 113*    Cardiac Enzymes: No results for input(s): "CKTOTAL", "CKMB", "CKMBINDEX", "TROPONINI" in the last 168  hours.  BNP (last 3 results) No results for input(s): "BNP" in the last 8760 hours.  ProBNP (last 3 results) No results for input(s): "PROBNP" in the last 8760 hours.  Radiological Exams: DG Chest Port 1 View  Result Date: 10/25/2022 CLINICAL DATA:  Pulmonary edema. EXAM: PORTABLE CHEST 1 VIEW COMPARISON:  10/18/2022 FINDINGS: Tracheostomy tube is identified with tip above the carina. Unchanged cardiomediastinal contours. Mild diffuse increase interstitial markings compatible with interstitial edema. More focal opacification within the right upper lobe is noted which may reflect airspace disease versus asymmetric edema. IMPRESSION: 1. Mild diffuse interstitial edema. 2. New, more focal opacification within the right upper lobe may reflect airspace disease versus asymmetric edema. Electronically Signed   By: Signa Kell M.D.   On: 10/25/2022 06:59    Assessment/Plan Active Problems:   Acute on chronic respiratory failure with hypoxia (HCC)   Multifocal pneumonia   Tracheostomy status (HCC)   Acute metabolic encephalopathy   COPD, severe (HCC)   Acute on chronic respiratory failure hypoxia patient is at baseline with T-piece continue with pulmonary toilet patient should be able completing 72 hours Multifocal pneumonia has been treated with antibiotics still has some interstitial edema noted Tracheostomy remains in place at this time will be ready for change over Metabolic encephalopathy supportive care Severe COPD medical management we will continue to follow along closely   I have personally seen and evaluated the patient, evaluated laboratory and imaging results, formulated the assessment and plan and placed  orders. The Patient requires high complexity decision making with multiple systems involvement.  Rounds were done with the Respiratory Therapy Director and Staff therapists and discussed with nursing staff also.  Yevonne Pax, MD [facility] Pulmonary Critical Care Medicine Sleep  Medicine

## 2022-10-27 DIAGNOSIS — J189 Pneumonia, unspecified organism: Secondary | ICD-10-CM | POA: Diagnosis not present

## 2022-10-27 DIAGNOSIS — J449 Chronic obstructive pulmonary disease, unspecified: Secondary | ICD-10-CM | POA: Diagnosis not present

## 2022-10-27 DIAGNOSIS — G9341 Metabolic encephalopathy: Secondary | ICD-10-CM | POA: Diagnosis not present

## 2022-10-27 DIAGNOSIS — J9621 Acute and chronic respiratory failure with hypoxia: Secondary | ICD-10-CM | POA: Diagnosis not present

## 2022-10-27 LAB — BASIC METABOLIC PANEL
Anion gap: 7 (ref 5–15)
BUN: 42 mg/dL — ABNORMAL HIGH (ref 8–23)
CO2: 38 mmol/L — ABNORMAL HIGH (ref 22–32)
Calcium: 9 mg/dL (ref 8.9–10.3)
Chloride: 93 mmol/L — ABNORMAL LOW (ref 98–111)
Creatinine, Ser: 0.84 mg/dL (ref 0.44–1.00)
GFR, Estimated: >60 mL/min (ref >60)
Glucose, Bld: 101 mg/dL — ABNORMAL HIGH (ref 70–99)
Potassium: 5.1 mmol/L (ref 3.5–5.1)
Sodium: 138 mmol/L (ref 135–145)

## 2022-10-27 LAB — CBC WITH DIFFERENTIAL/PLATELET
Abs Immature Granulocytes: 0.04 10*3/uL (ref 0.00–0.07)
Basophils Absolute: 0 10*3/uL (ref 0.0–0.1)
Basophils Relative: 0 %
Eosinophils Absolute: 0 10*3/uL (ref 0.0–0.5)
Eosinophils Relative: 1 %
HCT: 23.8 % — ABNORMAL LOW (ref 36.0–46.0)
Hemoglobin: 7.3 g/dL — ABNORMAL LOW (ref 12.0–15.0)
Immature Granulocytes: 1 %
Lymphocytes Relative: 12 %
Lymphs Abs: 0.7 10*3/uL (ref 0.7–4.0)
MCH: 32.7 pg (ref 26.0–34.0)
MCHC: 30.7 g/dL (ref 30.0–36.0)
MCV: 106.7 fL — ABNORMAL HIGH (ref 80.0–100.0)
Monocytes Absolute: 0.3 10*3/uL (ref 0.1–1.0)
Monocytes Relative: 5 %
Neutro Abs: 4.9 10*3/uL (ref 1.7–7.7)
Neutrophils Relative %: 81 %
Platelets: 116 10*3/uL — ABNORMAL LOW (ref 150–400)
RBC: 2.23 MIL/uL — ABNORMAL LOW (ref 3.87–5.11)
RDW: 16.8 % — ABNORMAL HIGH (ref 11.5–15.5)
WBC: 6 10*3/uL (ref 4.0–10.5)
nRBC: 0 % (ref 0.0–0.2)

## 2022-10-27 LAB — MAGNESIUM: Magnesium: 1.9 mg/dL (ref 1.7–2.4)

## 2022-10-27 LAB — PHOSPHORUS: Phosphorus: 3.3 mg/dL (ref 2.5–4.6)

## 2022-10-27 NOTE — Progress Notes (Signed)
Pulmonary [place]   PULMONARY CRITICAL CARE SERVICE  PROGRESS NOTE     Ameka Krigbaum Bridges  ZSW:[id number]  DOB: Feb 19, 1951   DOA: 09/27/2022  Referring Physician: Satira Sark, MD  HPI: [name] is a 71 y.o. female being followed for ventilator/airway/oxygen weaning Acute on Chronic Respiratory Failure.  Off the ventilator on T collar no distress currently on 30% FiO2  Medications: Reviewed on Rounds  Physical Exam:  Vitals: Temperature is 97.8 pulse 70 respiratory 29 blood pressure 128/52 saturation 99%  Ventilator Settings T collar FiO2 30%  General: Comfortable at this time Neck: supple Cardiovascular: no malignant arrhythmias Respiratory: Scattered rhonchi expansion equal Skin: no rash seen on limited exam Musculoskeletal: No gross abnormality Psychiatric:unable to assess Neurologic:no involuntary movements         Lab Data:   Basic Metabolic Panel: Recent Labs  Lab 10/23/22 0122 10/25/22 0139 10/27/22 0246  NA 135 138 138  K 4.6 4.7 5.1  CL 95* 95* 93*  CO2 35* 35* 38*  GLUCOSE 133* 107* 101*  BUN 42* 39* 42*  CREATININE 0.95 0.89 0.84  CALCIUM 8.5* 9.1 9.0  MG  --   --  1.9  PHOS  --   --  3.3    ABG: Recent Labs  Lab 10/24/22 1053  PHART 7.4  PCO2ART 64*  PO2ART 132*  HCO3 39.6*  O2SAT 98.4    Liver Function Tests: No results for input(s): "AST", "ALT", "ALKPHOS", "BILITOT", "PROT", "ALBUMIN" in the last 168 hours. No results for input(s): "LIPASE", "AMYLASE" in the last 168 hours. No results for input(s): "AMMONIA" in the last 168 hours.  CBC: Recent Labs  Lab 10/23/22 0122 10/24/22 0036 10/25/22 0139 10/27/22 0246  WBC 6.7 6.7 7.1 6.0  NEUTROABS  --   --   --  4.9  HGB 7.1* 7.1* 7.7* 7.3*  HCT 22.2* 22.7* 24.5* 23.8*  MCV 105.2* 105.1* 105.6* 106.7*  PLT 94* 100* 113* 116*    Cardiac Enzymes: No results for input(s): "CKTOTAL", "CKMB", "CKMBINDEX", "TROPONINI"  in the last 168 hours.  BNP (last 3 results) No results for input(s): "BNP" in the last 8760 hours.  ProBNP (last 3 results) No results for input(s): "PROBNP" in the last 8760 hours.  Radiological Exams: No results found.  Assessment/Plan Active Problems:   Acute on chronic respiratory failure with hypoxia (HCC)   Multifocal pneumonia   Tracheostomy status (HCC)   Acute metabolic encephalopathy   COPD, severe (HCC)   Acute on chronic respiratory failure hypoxia plan is to continue with T collar trials continue pulmonary toilet supportive care Multifocal pneumonia has been treated with antibiotics we will continue to follow along Tracheostomy remains in place Metabolic encephalopathy no change we will continue to monitor Severe COPD medical management   I have personally seen and evaluated the patient, evaluated laboratory and imaging results, formulated the assessment and plan and placed orders. The Patient requires high complexity decision making with multiple systems involvement.  Rounds were done with the Respiratory Therapy Director and Staff therapists and discussed with nursing staff also.  [name], MD [facility] Pulmonary Critical Care Medicine Sleep Medicine

## 2022-10-28 DIAGNOSIS — G9341 Metabolic encephalopathy: Secondary | ICD-10-CM | POA: Diagnosis not present

## 2022-10-28 DIAGNOSIS — J9621 Acute and chronic respiratory failure with hypoxia: Secondary | ICD-10-CM | POA: Diagnosis not present

## 2022-10-28 DIAGNOSIS — J449 Chronic obstructive pulmonary disease, unspecified: Secondary | ICD-10-CM | POA: Diagnosis not present

## 2022-10-28 DIAGNOSIS — J189 Pneumonia, unspecified organism: Secondary | ICD-10-CM | POA: Diagnosis not present

## 2022-10-28 LAB — POTASSIUM: Potassium: 4.9 mmol/L (ref 3.5–5.1)

## 2022-10-28 NOTE — Progress Notes (Addendum)
Pulmonary Critical Care Medicine [facility] [place]   PULMONARY CRITICAL CARE SERVICE  PROGRESS NOTE     [name]  GLO:[id number]  DOB: July 28, 1951   DOA: 09/27/2022  Referring Physician: Luna Kitchens, MD  HPI: [name] is a 71 y.o. female being followed for ventilator/airway/oxygen weaning Acute on Chronic Respiratory Failure.  Patient seen lying in bed, currently on ATC 30% and has been for about 5 days now.  We will go ahead and move forward with capping trials as tolerated today.  Medications: Reviewed on Rounds  Physical Exam:  Vitals: Temp 96.9, pulse 70, respirations 28, BP 147/89, SPO2 96%  Ventilator Settings ATC 30%  General: Comfortable at this time Neck: supple Cardiovascular: no malignant arrhythmias Respiratory: Bilaterally diminished Skin: no rash seen on limited exam Musculoskeletal: No gross abnormality Psychiatric:unable to assess Neurologic:no involuntary movements         Lab Data:   Basic Metabolic Panel: Recent Labs  Lab 10/23/22 0122 10/25/22 0139 10/27/22 0246 10/28/22 0144  NA 135 138 138  --   K 4.6 4.7 5.1 4.9  CL 95* 95* 93*  --   CO2 35* 35* 38*  --   GLUCOSE 133* 107* 101*  --   BUN 42* 39* 42*  --   CREATININE 0.95 0.89 0.84  --   CALCIUM 8.5* 9.1 9.0  --   MG  --   --  1.9  --   PHOS  --   --  3.3  --     ABG: Recent Labs  Lab 10/24/22 1053  PHART 7.4  PCO2ART 64*  PO2ART 132*  HCO3 39.6*  O2SAT 98.4    Liver Function Tests: No results for input(s): "AST", "ALT", "ALKPHOS", "BILITOT", "PROT", "ALBUMIN" in the last 168 hours. No results for input(s): "LIPASE", "AMYLASE" in the last 168 hours. No results for input(s): "AMMONIA" in the last 168 hours.  CBC: Recent Labs  Lab 10/23/22 0122 10/24/22 0036 10/25/22 0139 10/27/22 0246  WBC 6.7 6.7 7.1 6.0  NEUTROABS  --   --   --  4.9  HGB 7.1* 7.1* 7.7* 7.3*  HCT 22.2* 22.7* 24.5* 23.8*  MCV 105.2* 105.1* 105.6* 106.7*   PLT 94* 100* 113* 116*    Cardiac Enzymes: No results for input(s): "CKTOTAL", "CKMB", "CKMBINDEX", "TROPONINI" in the last 168 hours.  BNP (last 3 results) No results for input(s): "BNP" in the last 8760 hours.  ProBNP (last 3 results) No results for input(s): "PROBNP" in the last 8760 hours.  Radiological Exams: No results found.  Assessment/Plan Active Problems:   Acute on chronic respiratory failure with hypoxia (HCC)   Multifocal pneumonia   Tracheostomy status (HCC)   Acute metabolic encephalopathy   COPD, severe (HCC)   Acute on chronic respiratory failure hypoxia-has now been on ATC for about 5 days.  We will go ahead and order capping trials as tolerated.  Continue with supportive care. Multifocal pneumonia-has been treated with antibiotics.  Continue with supportive care. Tracheostomy-stable and remains in place.  Continue with trach care per protocol. Metabolic encephalopathy-no overall change noted.  Continue with every shift neurochecks. Severe COPD-continue with medical management.  I have personally seen and evaluated the patient, evaluated laboratory and imaging results, formulated the assessment and plan and placed orders. The Patient requires high complexity decision making with multiple systems involvement.  Rounds were done with the Respiratory Therapy Director and Staff therapists and discussed with nursing staff also.  Yevonne Pax, MD [facility]  Pulmonary Critical Care Medicine Sleep Medicine

## 2022-10-28 NOTE — Progress Notes (Addendum)
Pulmonary Critical Care Medicine [facility] [place]   PULMONARY CRITICAL CARE SERVICE  PROGRESS NOTE     [name]  NGE:[id number]  DOB: 1951-08-27   DOA: 09/27/2022  Referring Physician: Luna Kitchens, MD  HPI: [name] is a 71 y.o. female being followed for ventilator/airway/oxygen weaning Acute on Chronic Respiratory Failure. Patient seen lying in bed, currently on ATC 28%.  Unfortunately only lasted 2 hours on ATC yesterday before complaining of shortness of breath and chest pressure, so working on a second attempt at a 24-hour ATC goal today.  Medications: Reviewed on Rounds  Physical Exam:  Vitals: Temp 97.1, pulse 65, respirations 18, BP 101/47, SPO2 98%  Ventilator Settings ATC 28%  General: Comfortable at this time Neck: supple Cardiovascular: no malignant arrhythmias Respiratory: Bilaterally diminished Skin: no rash seen on limited exam Musculoskeletal: No gross abnormality Psychiatric:unable to assess Neurologic:no involuntary movements         Lab Data:   Basic Metabolic Panel: Recent Labs  Lab 10/23/22 0122 10/25/22 0139 10/27/22 0246 10/28/22 0144  NA 135 138 138  --   K 4.6 4.7 5.1 4.9  CL 95* 95* 93*  --   CO2 35* 35* 38*  --   GLUCOSE 133* 107* 101*  --   BUN 42* 39* 42*  --   CREATININE 0.95 0.89 0.84  --   CALCIUM 8.5* 9.1 9.0  --   MG  --   --  1.9  --   PHOS  --   --  3.3  --     ABG: Recent Labs  Lab 10/24/22 1053  PHART 7.4  PCO2ART 64*  PO2ART 132*  HCO3 39.6*  O2SAT 98.4    Liver Function Tests: No results for input(s): "AST", "ALT", "ALKPHOS", "BILITOT", "PROT", "ALBUMIN" in the last 168 hours. No results for input(s): "LIPASE", "AMYLASE" in the last 168 hours. No results for input(s): "AMMONIA" in the last 168 hours.  CBC: Recent Labs  Lab 10/23/22 0122 10/24/22 0036 10/25/22 0139 10/27/22 0246  WBC 6.7 6.7 7.1 6.0  NEUTROABS  --   --   --  4.9  HGB 7.1* 7.1* 7.7* 7.3*   HCT 22.2* 22.7* 24.5* 23.8*  MCV 105.2* 105.1* 105.6* 106.7*  PLT 94* 100* 113* 116*    Cardiac Enzymes: No results for input(s): "CKTOTAL", "CKMB", "CKMBINDEX", "TROPONINI" in the last 168 hours.  BNP (last 3 results) No results for input(s): "BNP" in the last 8760 hours.  ProBNP (last 3 results) No results for input(s): "PROBNP" in the last 8760 hours.  Radiological Exams: No results found.  Assessment/Plan Active Problems:   Acute on chronic respiratory failure with hypoxia (HCC)   Multifocal pneumonia   Tracheostomy status (HCC)   Acute metabolic encephalopathy   COPD, severe (HCC)   Acute on chronic respiratory failure hypoxia we will try to wean back on T collar once again.  She needs reassurance as her anxiety issues are playing a big role in her weaning Multifocal pneumonia has been treated with antibiotics we will continue to follow along closely. Metabolic encephalopathy no change we will continue to follow Severe COPD medical management Tracheostomy remains in place at this time   I have personally seen and evaluated the patient, evaluated laboratory and imaging results, formulated the assessment and plan and placed orders. The Patient requires high complexity decision making with multiple systems involvement.  Rounds were done with the Respiratory Therapy Director and Staff therapists and discussed with nursing staff also.  Yevonne Pax, MD [facility] Pulmonary Critical Care Medicine Sleep Medicine

## 2022-10-29 DIAGNOSIS — J189 Pneumonia, unspecified organism: Secondary | ICD-10-CM | POA: Diagnosis not present

## 2022-10-29 DIAGNOSIS — J449 Chronic obstructive pulmonary disease, unspecified: Secondary | ICD-10-CM | POA: Diagnosis not present

## 2022-10-29 DIAGNOSIS — J9621 Acute and chronic respiratory failure with hypoxia: Secondary | ICD-10-CM | POA: Diagnosis not present

## 2022-10-29 DIAGNOSIS — G9341 Metabolic encephalopathy: Secondary | ICD-10-CM | POA: Diagnosis not present

## 2022-10-29 LAB — BASIC METABOLIC PANEL
Anion gap: 7 (ref 5–15)
BUN: 37 mg/dL — ABNORMAL HIGH (ref 8–23)
CO2: 38 mmol/L — ABNORMAL HIGH (ref 22–32)
Calcium: 9.3 mg/dL (ref 8.9–10.3)
Chloride: 93 mmol/L — ABNORMAL LOW (ref 98–111)
Creatinine, Ser: 0.84 mg/dL (ref 0.44–1.00)
GFR, Estimated: >60 mL/min (ref >60)
Glucose, Bld: 125 mg/dL — ABNORMAL HIGH (ref 70–99)
Potassium: 5.1 mmol/L (ref 3.5–5.1)
Sodium: 138 mmol/L (ref 135–145)

## 2022-10-29 LAB — CBC
HCT: 25.6 % — ABNORMAL LOW (ref 36.0–46.0)
Hemoglobin: 7.8 g/dL — ABNORMAL LOW (ref 12.0–15.0)
MCH: 32.1 pg (ref 26.0–34.0)
MCHC: 30.5 g/dL (ref 30.0–36.0)
MCV: 105.3 fL — ABNORMAL HIGH (ref 80.0–100.0)
Platelets: 140 10*3/uL — ABNORMAL LOW (ref 150–400)
RBC: 2.43 MIL/uL — ABNORMAL LOW (ref 3.87–5.11)
RDW: 16.4 % — ABNORMAL HIGH (ref 11.5–15.5)
WBC: 6.3 10*3/uL (ref 4.0–10.5)
nRBC: 0 % (ref 0.0–0.2)

## 2022-10-29 LAB — MAGNESIUM: Magnesium: 1.8 mg/dL (ref 1.7–2.4)

## 2022-10-30 ENCOUNTER — Other Ambulatory Visit (HOSPITAL_COMMUNITY): Payer: Medicare Other

## 2022-10-30 DIAGNOSIS — J449 Chronic obstructive pulmonary disease, unspecified: Secondary | ICD-10-CM | POA: Diagnosis not present

## 2022-10-30 DIAGNOSIS — J189 Pneumonia, unspecified organism: Secondary | ICD-10-CM | POA: Diagnosis not present

## 2022-10-30 DIAGNOSIS — J9621 Acute and chronic respiratory failure with hypoxia: Secondary | ICD-10-CM | POA: Diagnosis not present

## 2022-10-30 DIAGNOSIS — G9341 Metabolic encephalopathy: Secondary | ICD-10-CM | POA: Diagnosis not present

## 2022-10-30 NOTE — Progress Notes (Cosign Needed)
Pulmonary Critical Care Medicine [facility] [place]   PULMONARY CRITICAL CARE SERVICE  PROGRESS NOTE     Arienne Deur Ellis-Hill  VHQ:[id number]  DOB: 1951/08/29   DOA: 09/27/2022  Referring Physician: Luna Kitchens, MD  HPI: Klarke Kenealy Ellis-Hill is a 71 y.o. female being followed for ventilator/airway/oxygen weaning Acute on Chronic Respiratory Failure.  Patient seen lying in bed, currently on ATC 35%.  Continue with PMV trials as tolerated.  Patient was started on IV saline  Medications: Reviewed on Rounds  Physical Exam:  Vitals: Temp 97.5, pulse 70, respirations 40, BP 136/62, SPO2 95%  Ventilator Settings ATC 35%  General: Comfortable at this time Neck: supple Cardiovascular: no malignant arrhythmias Respiratory: Bilaterally coarse Skin: no rash seen on limited exam Musculoskeletal: No gross abnormality Psychiatric:unable to assess Neurologic:no involuntary movements         Lab Data:   Basic Metabolic Panel: Recent Labs  Lab 10/25/22 0139 10/27/22 0246 10/28/22 0144 10/29/22 0131  NA 138 138  --  138  K 4.7 5.1 4.9 5.1  CL 95* 93*  --  93*  CO2 35* 38*  --  38*  GLUCOSE 107* 101*  --  125*  BUN 39* 42*  --  37*  CREATININE 0.89 0.84  --  0.84  CALCIUM 9.1 9.0  --  9.3  MG  --  1.9  --  1.8  PHOS  --  3.3  --   --     ABG: Recent Labs  Lab 10/24/22 1053  PHART 7.4  PCO2ART 64*  PO2ART 132*  HCO3 39.6*  O2SAT 98.4    Liver Function Tests: No results for input(s): "AST", "ALT", "ALKPHOS", "BILITOT", "PROT", "ALBUMIN" in the last 168 hours. No results for input(s): "LIPASE", "AMYLASE" in the last 168 hours. No results for input(s): "AMMONIA" in the last 168 hours.  CBC: Recent Labs  Lab 10/24/22 0036 10/25/22 0139 10/27/22 0246 10/29/22 0131  WBC 6.7 7.1 6.0 6.3  NEUTROABS  --   --  4.9  --   HGB 7.1* 7.7* 7.3* 7.8*  HCT 22.7* 24.5* 23.8* 25.6*  MCV 105.1* 105.6* 106.7* 105.3*  PLT 100* 113* 116* 140*    Cardiac  Enzymes: No results for input(s): "CKTOTAL", "CKMB", "CKMBINDEX", "TROPONINI" in the last 168 hours.  BNP (last 3 results) No results for input(s): "BNP" in the last 8760 hours.  ProBNP (last 3 results) No results for input(s): "PROBNP" in the last 8760 hours.  Radiological Exams: No results found.  Assessment/Plan Active Problems:   Acute on chronic respiratory failure with hypoxia (HCC)   Multifocal pneumonia   Tracheostomy status (HCC)   Acute metabolic encephalopathy   COPD, severe (HCC)  Acute on chronic respiratory failure hypoxia-has been on ATC for 7 days, we were going to attempt capping travel previously however he had some frank red bloody secretions noted with suctioning.  He was started on IV saline.  Changed to an XLT 6 yesterday and on continue with PMV trials.  Continue with supportive care. Multifocal pneumonia-has been treated with antibiotics.  Continue with supportive care. Tracheostomy-stable and remains in place.  Continue with trach care per protocol. Metabolic encephalopathy-no overall change noted.  Continue with every shift neurochecks. Severe COPD-continue with medical management.   I have personally seen and evaluated the patient, evaluated laboratory and imaging results, formulated the assessment and plan and placed orders. The Patient requires high complexity decision making with multiple systems involvement.  Rounds were done with the Respiratory Therapy  Paediatric nurse therapists and discussed with nursing staff also.  Yevonne Pax, MD [facility] Pulmonary Critical Care Medicine Sleep Medicine

## 2022-10-30 NOTE — Progress Notes (Addendum)
Pulmonary [place]   PULMONARY CRITICAL CARE SERVICE  PROGRESS NOTE     [name]  NIO:[id number]  DOB: 1951-09-10   DOA: 09/27/2022  Referring Physician: Satira Sark, MD  HPI: [name] is a 71 y.o. female being followed for ventilator/airway/oxygen weaning Acute on Chronic Respiratory Failure.  Patient seen lying in bed, currently on ATC 30%.  Does have copious frank red blood with suctioning.  Working to go ahead and downsize patient to a 6 XLT and start PMV trials today.  Medications: Reviewed on Rounds  Physical Exam:  Vitals: Temp 97.1, pulse 72, respirations 27, BP 154/65, SPO2 98%  Ventilator Settings ATC 30%  General: Comfortable at this time Neck: supple Cardiovascular: no malignant arrhythmias Respiratory: Bilaterally coarse Skin: no rash seen on limited exam Musculoskeletal: No gross abnormality Psychiatric:unable to assess Neurologic:no involuntary movements         Lab Data:   Basic Metabolic Panel: Recent Labs  Lab 10/25/22 0139 10/27/22 0246 10/28/22 0144 10/29/22 0131  NA 138 138  --  138  K 4.7 5.1 4.9 5.1  CL 95* 93*  --  93*  CO2 35* 38*  --  38*  GLUCOSE 107* 101*  --  125*  BUN 39* 42*  --  37*  CREATININE 0.89 0.84  --  0.84  CALCIUM 9.1 9.0  --  9.3  MG  --  1.9  --  1.8  PHOS  --  3.3  --   --     ABG: Recent Labs  Lab 10/24/22 1053  PHART 7.4  PCO2ART 64*  PO2ART 132*  HCO3 39.6*  O2SAT 98.4    Liver Function Tests: No results for input(s): "AST", "ALT", "ALKPHOS", "BILITOT", "PROT", "ALBUMIN" in the last 168 hours. No results for input(s): "LIPASE", "AMYLASE" in the last 168 hours. No results for input(s): "AMMONIA" in the last 168 hours.  CBC: Recent Labs  Lab 10/24/22 0036 10/25/22 0139 10/27/22 0246 10/29/22 0131  WBC 6.7 7.1 6.0 6.3  NEUTROABS  --   --  4.9  --   HGB 7.1* 7.7* 7.3* 7.8*  HCT 22.7* 24.5* 23.8* 25.6*  MCV 105.1* 105.6*  106.7* 105.3*  PLT 100* 113* 116* 140*    Cardiac Enzymes: No results for input(s): "CKTOTAL", "CKMB", "CKMBINDEX", "TROPONINI" in the last 168 hours.  BNP (last 3 results) No results for input(s): "BNP" in the last 8760 hours.  ProBNP (last 3 results) No results for input(s): "PROBNP" in the last 8760 hours.  Radiological Exams: No results found.  Assessment/Plan Active Problems:   Acute on chronic respiratory failure with hypoxia (HCC)   Multifocal pneumonia   Tracheostomy status (HCC)   Acute metabolic encephalopathy   COPD, severe (HCC)  Acute on chronic respiratory failure hypoxia-has been on ATC for 6 days, we were going to attempt capping travel previously however he had some frank red bloody secretions noted with suctioning.  We will downsized to a #6 XLT today and start PMV trials as tolerated.  Continue with supportive care. Multifocal pneumonia-has been treated with antibiotics.  Continue with supportive care. Tracheostomy-stable and remains in place.  Continue with trach care per protocol. Metabolic encephalopathy-no overall change noted.  Continue with every shift neurochecks. Severe COPD-continue with medical management.   I have personally seen and evaluated the patient, evaluated laboratory and imaging results, formulated the assessment and plan and placed orders. The Patient requires high complexity decision making with multiple systems involvement.  Rounds were done with the Respiratory Therapy Director and Staff therapists and discussed with nursing staff also.  [name], MD [facility] Pulmonary Critical Care Medicine Sleep Medicine

## 2022-10-31 DIAGNOSIS — J189 Pneumonia, unspecified organism: Secondary | ICD-10-CM | POA: Diagnosis not present

## 2022-10-31 DIAGNOSIS — G9341 Metabolic encephalopathy: Secondary | ICD-10-CM | POA: Diagnosis not present

## 2022-10-31 DIAGNOSIS — J449 Chronic obstructive pulmonary disease, unspecified: Secondary | ICD-10-CM | POA: Diagnosis not present

## 2022-10-31 DIAGNOSIS — J9621 Acute and chronic respiratory failure with hypoxia: Secondary | ICD-10-CM | POA: Diagnosis not present

## 2022-10-31 LAB — BASIC METABOLIC PANEL
Anion gap: 6 (ref 5–15)
BUN: 31 mg/dL — ABNORMAL HIGH (ref 8–23)
CO2: 38 mmol/L — ABNORMAL HIGH (ref 22–32)
Calcium: 9.3 mg/dL (ref 8.9–10.3)
Chloride: 93 mmol/L — ABNORMAL LOW (ref 98–111)
Creatinine, Ser: 0.79 mg/dL (ref 0.44–1.00)
GFR, Estimated: >60 mL/min (ref >60)
Glucose, Bld: 102 mg/dL — ABNORMAL HIGH (ref 70–99)
Potassium: 4.9 mmol/L (ref 3.5–5.1)
Sodium: 137 mmol/L (ref 135–145)

## 2022-10-31 LAB — CBC
HCT: 24.8 % — ABNORMAL LOW (ref 36.0–46.0)
Hemoglobin: 7.8 g/dL — ABNORMAL LOW (ref 12.0–15.0)
MCH: 32.8 pg (ref 26.0–34.0)
MCHC: 31.5 g/dL (ref 30.0–36.0)
MCV: 104.2 fL — ABNORMAL HIGH (ref 80.0–100.0)
Platelets: 148 10*3/uL — ABNORMAL LOW (ref 150–400)
RBC: 2.38 MIL/uL — ABNORMAL LOW (ref 3.87–5.11)
RDW: 16.5 % — ABNORMAL HIGH (ref 11.5–15.5)
WBC: 5.9 10*3/uL (ref 4.0–10.5)
nRBC: 0 % (ref 0.0–0.2)

## 2022-10-31 LAB — MAGNESIUM: Magnesium: 1.9 mg/dL (ref 1.7–2.4)

## 2022-11-01 DIAGNOSIS — J9621 Acute and chronic respiratory failure with hypoxia: Secondary | ICD-10-CM | POA: Diagnosis not present

## 2022-11-01 DIAGNOSIS — J449 Chronic obstructive pulmonary disease, unspecified: Secondary | ICD-10-CM | POA: Diagnosis not present

## 2022-11-01 DIAGNOSIS — G9341 Metabolic encephalopathy: Secondary | ICD-10-CM | POA: Diagnosis not present

## 2022-11-01 DIAGNOSIS — J189 Pneumonia, unspecified organism: Secondary | ICD-10-CM | POA: Diagnosis not present

## 2022-11-01 NOTE — Progress Notes (Addendum)
Pulmonary [place]   PULMONARY CRITICAL CARE SERVICE  PROGRESS NOTE     Tehilla Coffel Bridges  CBJ:[id number]  DOB: 09-01-1951   DOA: 09/27/2022  Referring Physician: Satira Sark, MD  HPI: [name] is a 71 y.o. female being followed for ventilator/airway/oxygen weaning Acute on Chronic Respiratory Failure.  Patient seen lying in bed, currently on ATC 35%.  She is with PMV and tolerating well, she used PMV all day yesterday.  Medications: Reviewed on Rounds  Physical Exam:  Vitals: 6.7, pulse 66, respirations 48, BP 120/79, SPO2 98%  Ventilator Settings ATC 35% with PMV in place  General: Comfortable at this time Neck: supple Cardiovascular: no malignant arrhythmias Respiratory: Bilaterally diminished Skin: no rash seen on limited exam Musculoskeletal: No gross abnormality Psychiatric:unable to assess Neurologic:no involuntary movements         Lab Data:   Basic Metabolic Panel: Recent Labs  Lab 10/27/22 0246 10/28/22 0144 10/29/22 0131 10/31/22 0105  NA 138  --  138 137  K 5.1 4.9 5.1 4.9  CL 93*  --  93* 93*  CO2 38*  --  38* 38*  GLUCOSE 101*  --  125* 102*  BUN 42*  --  37* 31*  CREATININE 0.84  --  0.84 0.79  CALCIUM 9.0  --  9.3 9.3  MG 1.9  --  1.8 1.9  PHOS 3.3  --   --   --     ABG: No results for input(s): "PHART", "PCO2ART", "PO2ART", "HCO3", "O2SAT" in the last 168 hours.  Liver Function Tests: No results for input(s): "AST", "ALT", "ALKPHOS", "BILITOT", "PROT", "ALBUMIN" in the last 168 hours. No results for input(s): "LIPASE", "AMYLASE" in the last 168 hours. No results for input(s): "AMMONIA" in the last 168 hours.  CBC: Recent Labs  Lab 10/27/22 0246 10/29/22 0131 10/31/22 0105  WBC 6.0 6.3 5.9  NEUTROABS 4.9  --   --   HGB 7.3* 7.8* 7.8*  HCT 23.8* 25.6* 24.8*  MCV 106.7* 105.3* 104.2*  PLT 116* 140* 148*    Cardiac Enzymes: No results for input(s): "CKTOTAL",  "CKMB", "CKMBINDEX", "TROPONINI" in the last 168 hours.  BNP (last 3 results) No results for input(s): "BNP" in the last 8760 hours.  ProBNP (last 3 results) No results for input(s): "PROBNP" in the last 8760 hours.  Radiological Exams: No results found.  Assessment/Plan Active Problems:   Acute on chronic respiratory failure with hypoxia (HCC)   Multifocal pneumonia   Tracheostomy status (HCC)   Acute metabolic encephalopathy   COPD, severe (HCC)  Acute on chronic respiratory failure hypoxia-has been on ATC for 8 days, we were going to attempt capping travel previously however he had some frank red bloody secretions noted with suctioning.  He was started on Ice saline.  Changed to an XLT 6  on continue with PMV trials.  Continue with supportive care. Multifocal pneumonia-has been treated with antibiotics.  Continue with supportive care. Tracheostomy-stable and remains in place.  Continue with trach care per protocol. Metabolic encephalopathy-no overall change noted.  Continue with every shift neurochecks. Severe COPD-continue with medical management.   I have personally seen and evaluated the patient, evaluated laboratory and imaging results, formulated the assessment and plan and placed orders. The Patient requires high complexity decision making with multiple systems involvement.  Rounds were done with the Respiratory Therapy Director and Staff therapists and discussed with nursing staff also.  [name], MD [facility] Pulmonary Critical Care  Medicine Sleep Medicine

## 2022-11-01 NOTE — Progress Notes (Addendum)
Pulmonary [place]   PULMONARY CRITICAL CARE SERVICE  PROGRESS NOTE     Zahriah Roes Bridges  QAS:[id number]  DOB: 1951-05-30   DOA: 09/27/2022  Referring Physician: Satira Sark, MD  HPI: [name] is a 71 y.o. female being followed for ventilator/airway/oxygen weaning Acute on Chronic Respiratory Failure.  Patient seen sitting in bed, currently on ATC 35%.  We are can go ahead and start capping trials as tolerated and we will place her on BiPAP 16/8 at nighttime and titrate to comfort.  Medications: Reviewed on Rounds  Physical Exam:  Vitals: Temp 96.3, pulse 65, respirations 25, BP 131/52, SPO2 98%  Ventilator Settings ATC 35% with PMV in place  General: Comfortable at this time Neck: supple Cardiovascular: no malignant arrhythmias Respiratory: Bilaterally diminished Skin: no rash seen on limited exam Musculoskeletal: No gross abnormality Psychiatric:unable to assess Neurologic:no involuntary movements         Lab Data:   Basic Metabolic Panel: Recent Labs  Lab 10/27/22 0246 10/28/22 0144 10/29/22 0131 10/31/22 0105  NA 138  --  138 137  K 5.1 4.9 5.1 4.9  CL 93*  --  93* 93*  CO2 38*  --  38* 38*  GLUCOSE 101*  --  125* 102*  BUN 42*  --  37* 31*  CREATININE 0.84  --  0.84 0.79  CALCIUM 9.0  --  9.3 9.3  MG 1.9  --  1.8 1.9  PHOS 3.3  --   --   --     ABG: No results for input(s): "PHART", "PCO2ART", "PO2ART", "HCO3", "O2SAT" in the last 168 hours.  Liver Function Tests: No results for input(s): "AST", "ALT", "ALKPHOS", "BILITOT", "PROT", "ALBUMIN" in the last 168 hours. No results for input(s): "LIPASE", "AMYLASE" in the last 168 hours. No results for input(s): "AMMONIA" in the last 168 hours.  CBC: Recent Labs  Lab 10/27/22 0246 10/29/22 0131 10/31/22 0105  WBC 6.0 6.3 5.9  NEUTROABS 4.9  --   --   HGB 7.3* 7.8* 7.8*  HCT 23.8* 25.6* 24.8*  MCV 106.7* 105.3* 104.2*  PLT 116*  140* 148*    Cardiac Enzymes: No results for input(s): "CKTOTAL", "CKMB", "CKMBINDEX", "TROPONINI" in the last 168 hours.  BNP (last 3 results) No results for input(s): "BNP" in the last 8760 hours.  ProBNP (last 3 results) No results for input(s): "PROBNP" in the last 8760 hours.  Radiological Exams: No results found.  Assessment/Plan Active Problems:   Acute on chronic respiratory failure with hypoxia (HCC)   Multifocal pneumonia   Tracheostomy status (HCC)   Acute metabolic encephalopathy   COPD, severe (HCC)  Acute on chronic respiratory failure hypoxia-has been on ATC for 9 days.  We will go ahead and continue with capping trials today and BiPAP while capped at evening 16/5 FiO2 28% and titrate to comfort.  Continue with supportive care. Multifocal pneumonia-has been treated with antibiotics.  Continue with supportive care. Tracheostomy-stable and remains in place.  Continue with trach care per protocol. Metabolic encephalopathy-no overall change noted.  Continue with every shift neurochecks. Severe COPD-continue with medical management.   I have personally seen and evaluated the patient, evaluated laboratory and imaging results, formulated the assessment and plan and placed orders. The Patient requires high complexity decision making with multiple systems involvement.  Rounds were done with the Respiratory Therapy Director and Staff therapists and discussed with nursing staff also.  [name], MD [facility] Pulmonary Critical Care Medicine  Sleep Medicine

## 2022-11-02 DIAGNOSIS — J9621 Acute and chronic respiratory failure with hypoxia: Secondary | ICD-10-CM | POA: Diagnosis not present

## 2022-11-02 DIAGNOSIS — J449 Chronic obstructive pulmonary disease, unspecified: Secondary | ICD-10-CM | POA: Diagnosis not present

## 2022-11-02 DIAGNOSIS — G9341 Metabolic encephalopathy: Secondary | ICD-10-CM | POA: Diagnosis not present

## 2022-11-02 DIAGNOSIS — J189 Pneumonia, unspecified organism: Secondary | ICD-10-CM | POA: Diagnosis not present

## 2022-11-02 LAB — CBC
HCT: 23.2 % — ABNORMAL LOW (ref 36.0–46.0)
Hemoglobin: 7.4 g/dL — ABNORMAL LOW (ref 12.0–15.0)
MCH: 32.9 pg (ref 26.0–34.0)
MCHC: 31.9 g/dL (ref 30.0–36.0)
MCV: 103.1 fL — ABNORMAL HIGH (ref 80.0–100.0)
Platelets: 132 10*3/uL — ABNORMAL LOW (ref 150–400)
RBC: 2.25 MIL/uL — ABNORMAL LOW (ref 3.87–5.11)
RDW: 16.4 % — ABNORMAL HIGH (ref 11.5–15.5)
WBC: 5.2 10*3/uL (ref 4.0–10.5)
nRBC: 0 % (ref 0.0–0.2)

## 2022-11-02 LAB — BASIC METABOLIC PANEL
Anion gap: 8 (ref 5–15)
BUN: 43 mg/dL — ABNORMAL HIGH (ref 8–23)
CO2: 37 mmol/L — ABNORMAL HIGH (ref 22–32)
Calcium: 9.1 mg/dL (ref 8.9–10.3)
Chloride: 93 mmol/L — ABNORMAL LOW (ref 98–111)
Creatinine, Ser: 0.87 mg/dL (ref 0.44–1.00)
GFR, Estimated: >60 mL/min (ref >60)
Glucose, Bld: 133 mg/dL — ABNORMAL HIGH (ref 70–99)
Potassium: 4.9 mmol/L (ref 3.5–5.1)
Sodium: 138 mmol/L (ref 135–145)

## 2022-11-02 LAB — MAGNESIUM: Magnesium: 1.8 mg/dL (ref 1.7–2.4)

## 2022-11-03 DIAGNOSIS — J189 Pneumonia, unspecified organism: Secondary | ICD-10-CM | POA: Diagnosis not present

## 2022-11-03 DIAGNOSIS — G9341 Metabolic encephalopathy: Secondary | ICD-10-CM | POA: Diagnosis not present

## 2022-11-03 DIAGNOSIS — J449 Chronic obstructive pulmonary disease, unspecified: Secondary | ICD-10-CM | POA: Diagnosis not present

## 2022-11-03 DIAGNOSIS — J9621 Acute and chronic respiratory failure with hypoxia: Secondary | ICD-10-CM | POA: Diagnosis not present

## 2022-11-03 NOTE — Progress Notes (Addendum)
Pulmonary [place]   PULMONARY CRITICAL CARE SERVICE  PROGRESS NOTE     Lahari Suttles Ellis-Hill  EXB:[id number]  DOB: 06-18-1951   DOA: 09/27/2022  Referring Physician: Satira Sark, MD  HPI: Besan Ketchem Ellis-Hill is a 71 y.o. female being followed for ventilator/airway/oxygen weaning Acute on Chronic Respiratory Failure.  Patient seen lying in bed, currently capped and on 2 L nasal cannula.  Has not been capped for 48 hours.  Continue BiPAP use at night.  Medications: Reviewed on Rounds  Physical Exam:  Vitals: Temp 97.6, pulse 89, respirations 13, BP 118/52, SPO2 100%  Ventilator Settings capped and on 2 L nasal cannula  General: Comfortable at this time Neck: supple Cardiovascular: no malignant arrhythmias Respiratory: Bilaterally diminished Skin: no rash seen on limited exam Musculoskeletal: No gross abnormality Psychiatric:unable to assess Neurologic:no involuntary movements         Lab Data:   Basic Metabolic Panel: Recent Labs  Lab 10/28/22 0144 10/29/22 0131 10/31/22 0105 11/02/22 0151  NA  --  138 137 138  K 4.9 5.1 4.9 4.9  CL  --  93* 93* 93*  CO2  --  38* 38* 37*  GLUCOSE  --  125* 102* 133*  BUN  --  37* 31* 43*  CREATININE  --  0.84 0.79 0.87  CALCIUM  --  9.3 9.3 9.1  MG  --  1.8 1.9 1.8    ABG: No results for input(s): "PHART", "PCO2ART", "PO2ART", "HCO3", "O2SAT" in the last 168 hours.  Liver Function Tests: No results for input(s): "AST", "ALT", "ALKPHOS", "BILITOT", "PROT", "ALBUMIN" in the last 168 hours. No results for input(s): "LIPASE", "AMYLASE" in the last 168 hours. No results for input(s): "AMMONIA" in the last 168 hours.  CBC: Recent Labs  Lab 10/29/22 0131 10/31/22 0105 11/02/22 0151  WBC 6.3 5.9 5.2  HGB 7.8* 7.8* 7.4*  HCT 25.6* 24.8* 23.2*  MCV 105.3* 104.2* 103.1*  PLT 140* 148* 132*    Cardiac Enzymes: No results for input(s): "CKTOTAL", "CKMB", "CKMBINDEX",  "TROPONINI" in the last 168 hours.  BNP (last 3 results) No results for input(s): "BNP" in the last 8760 hours.  ProBNP (last 3 results) No results for input(s): "PROBNP" in the last 8760 hours.  Radiological Exams: No results found.  Assessment/Plan Active Problems:   Acute on chronic respiratory failure with hypoxia (HCC)   Multifocal pneumonia   Tracheostomy status (HCC)   Acute metabolic encephalopathy   COPD, severe (HCC)  Acute on chronic respiratory failure hypoxia-remains capped and has been for 48 hours tolerating well.  Continue BiPAP 16/8 FiO2 30% at night.  Titrate to comfort.  Continue with supportive care. Multifocal pneumonia-has been treated with antibiotics.  Continue with supportive care. Tracheostomy-stable and remains in place.  Continue with trach care per protocol. Metabolic encephalopathy-no overall change noted.  Continue with every shift neurochecks. Severe COPD-continue with medical management.   I have personally seen and evaluated the patient, evaluated laboratory and imaging results, formulated the assessment and plan and placed orders. The Patient requires high complexity decision making with multiple systems involvement.  Rounds were done with the Respiratory Therapy Director and Staff therapists and discussed with nursing staff also.  [name], MD [facility] Pulmonary Critical Care Medicine Sleep Medicine

## 2022-11-03 NOTE — Progress Notes (Addendum)
Pulmonary [facility]   PULMONARY CRITICAL CARE SERVICE  PROGRESS NOTE     Jametta Moorehead Bridges  PNT:[id number]  DOB: [birth year]   DOA: 09/27/2022  Referring Physician: Satira Sark, MD  HPI: [name] is a 71 y.o. female being followed for ventilator/airway/oxygen weaning Acute on Chronic Respiratory Failure.  Patient seen lying in bed, currently on BiPAP 16/8.  We will continue with capping during the day and BiPAP at night.  Medications: Reviewed on Rounds  Physical Exam:  Vitals: Temp 97.4, pulse 66, respirations 19, BP 126/64, SPO2 100%  Ventilator Settings BiPAP 16/8, FiO2 30%  General: Comfortable at this time Neck: supple Cardiovascular: no malignant arrhythmias Respiratory: Bilaterally diminished Skin: no rash seen on limited exam Musculoskeletal: No gross abnormality Psychiatric:unable to assess Neurologic:no involuntary movements         Lab Data:   Basic Metabolic Panel: Recent Labs  Lab 10/28/22 0144 10/29/22 0131 10/31/22 0105 11/02/22 0151  NA  --  138 137 138  K 4.9 5.1 4.9 4.9  CL  --  93* 93* 93*  CO2  --  38* 38* 37*  GLUCOSE  --  125* 102* 133*  BUN  --  37* 31* 43*  CREATININE  --  0.84 0.79 0.87  CALCIUM  --  9.3 9.3 9.1  MG  --  1.8 1.9 1.8    ABG: No results for input(s): "PHART", "PCO2ART", "PO2ART", "HCO3", "O2SAT" in the last 168 hours.  Liver Function Tests: No results for input(s): "AST", "ALT", "ALKPHOS", "BILITOT", "PROT", "ALBUMIN" in the last 168 hours. No results for input(s): "LIPASE", "AMYLASE" in the last 168 hours. No results for input(s): "AMMONIA" in the last 168 hours.  CBC: Recent Labs  Lab 10/29/22 0131 10/31/22 0105 11/02/22 0151  WBC 6.3 5.9 5.2  HGB 7.8* 7.8* 7.4*  HCT 25.6* 24.8* 23.2*  MCV 105.3* 104.2* 103.1*  PLT 140* 148* 132*    Cardiac Enzymes: No results for input(s): "CKTOTAL", "CKMB", "CKMBINDEX", "TROPONINI" in the last 168  hours.  BNP (last 3 results) No results for input(s): "BNP" in the last 8760 hours.  ProBNP (last 3 results) No results for input(s): "PROBNP" in the last 8760 hours.  Radiological Exams: No results found.  Assessment/Plan Active Problems:   Acute on chronic respiratory failure with hypoxia (HCC)   Multifocal pneumonia   Tracheostomy status (HCC)   Acute metabolic encephalopathy   COPD, severe (HCC)   Acute on chronic respiratory failure hypoxia-currently doing capping trials today with BiPAP at night and patient is tolerating well.  Continue BiPAP 16/8 FiO2 30% at night.  Titrate to comfort.  Continue with supportive care. Multifocal pneumonia-has been treated with antibiotics.  Continue with supportive care. Tracheostomy-stable and remains in place.  Continue with trach care per protocol. Metabolic encephalopathy-no overall change noted.  Continue with every shift neurochecks. Severe COPD-continue with medical management.  I have personally seen and evaluated the patient, evaluated laboratory and imaging results, formulated the assessment and plan and placed orders. The Patient requires high complexity decision making with multiple systems involvement.  Rounds were done with the Respiratory Therapy Director and Staff therapists and discussed with nursing staff also.  [name], MD [facility] Pulmonary Critical Care Medicine Sleep Medicine

## 2022-11-04 DIAGNOSIS — G9341 Metabolic encephalopathy: Secondary | ICD-10-CM | POA: Diagnosis not present

## 2022-11-04 DIAGNOSIS — J189 Pneumonia, unspecified organism: Secondary | ICD-10-CM | POA: Diagnosis not present

## 2022-11-04 DIAGNOSIS — J449 Chronic obstructive pulmonary disease, unspecified: Secondary | ICD-10-CM | POA: Diagnosis not present

## 2022-11-04 DIAGNOSIS — J9621 Acute and chronic respiratory failure with hypoxia: Secondary | ICD-10-CM | POA: Diagnosis not present

## 2022-11-04 NOTE — Progress Notes (Signed)
Pulmonary Critical Care Medicine [facility] [place]   PULMONARY CRITICAL CARE SERVICE  PROGRESS NOTE     [name]  ZOX:[id number]  DOB: August 12, 1951   DOA: 09/27/2022  Referring Physician: Luna Kitchens, MD  HPI: [name] is a 71 y.o. female being followed for ventilator/airway/oxygen weaning Acute on Chronic Respiratory Failure.  She is capping on 3 L of oxygen goes in on the BiPAP at nighttime with a pressure of 14/5 seems to be tolerating it well  Medications: Reviewed on Rounds  Physical Exam:  Vitals: Temperature is 97.9 pulse of 65 respiratory 17 blood pressure is 119/62 saturations 99%  Ventilator Settings capping off the ventilator.  General: Comfortable at this time Neck: supple Cardiovascular: no malignant arrhythmias Respiratory: No rhonchi no rales are noted at this time Skin: no rash seen on limited exam Musculoskeletal: No gross abnormality Psychiatric:unable to assess Neurologic:no involuntary movements         Lab Data:   Basic Metabolic Panel: Recent Labs  Lab 10/29/22 0131 10/31/22 0105 11/02/22 0151  NA 138 137 138  K 5.1 4.9 4.9  CL 93* 93* 93*  CO2 38* 38* 37*  GLUCOSE 125* 102* 133*  BUN 37* 31* 43*  CREATININE 0.84 0.79 0.87  CALCIUM 9.3 9.3 9.1  MG 1.8 1.9 1.8    ABG: No results for input(s): "PHART", "PCO2ART", "PO2ART", "HCO3", "O2SAT" in the last 168 hours.  Liver Function Tests: No results for input(s): "AST", "ALT", "ALKPHOS", "BILITOT", "PROT", "ALBUMIN" in the last 168 hours. No results for input(s): "LIPASE", "AMYLASE" in the last 168 hours. No results for input(s): "AMMONIA" in the last 168 hours.  CBC: Recent Labs  Lab 10/29/22 0131 10/31/22 0105 11/02/22 0151  WBC 6.3 5.9 5.2  HGB 7.8* 7.8* 7.4*  HCT 25.6* 24.8* 23.2*  MCV 105.3* 104.2* 103.1*  PLT 140* 148* 132*    Cardiac Enzymes: No results for input(s): "CKTOTAL", "CKMB", "CKMBINDEX", "TROPONINI" in the last 168  hours.  BNP (last 3 results) No results for input(s): "BNP" in the last 8760 hours.  ProBNP (last 3 results) No results for input(s): "PROBNP" in the last 8760 hours.  Radiological Exams: No results found.  Assessment/Plan Active Problems:   Acute on chronic respiratory failure with hypoxia (HCC)   Multifocal pneumonia   Tracheostomy status (HCC)   Acute metabolic encephalopathy   COPD, severe (HCC)   Acute on chronic respiratory failure hypoxia plan is to continue with the weaning on capping trials 3 L/day has been tolerating BiPAP fairly well.  We will continue to monitor prior to consideration of decannulation Multifocal pneumonia treated clinically improved we will continue to follow along closely Tracheostomy remains in place at this time we will continue with supportive care doing well with capping Severe COPD medical management continue to follow along Metabolic encephalopathy slow to improve we will continue to monitor   I have personally seen and evaluated the patient, evaluated laboratory and imaging results, formulated the assessment and plan and placed orders. The Patient requires high complexity decision making with multiple systems involvement.  Rounds were done with the Respiratory Therapy Director and Staff therapists and discussed with nursing staff also.  Yevonne Pax, MD [facility] Pulmonary Critical Care Medicine Sleep Medicine

## 2022-11-05 DIAGNOSIS — G9341 Metabolic encephalopathy: Secondary | ICD-10-CM | POA: Diagnosis not present

## 2022-11-05 DIAGNOSIS — J9621 Acute and chronic respiratory failure with hypoxia: Secondary | ICD-10-CM | POA: Diagnosis not present

## 2022-11-05 DIAGNOSIS — J449 Chronic obstructive pulmonary disease, unspecified: Secondary | ICD-10-CM | POA: Diagnosis not present

## 2022-11-05 DIAGNOSIS — J189 Pneumonia, unspecified organism: Secondary | ICD-10-CM | POA: Diagnosis not present

## 2022-11-05 LAB — BASIC METABOLIC PANEL
Anion gap: 6 (ref 5–15)
BUN: 41 mg/dL — ABNORMAL HIGH (ref 8–23)
CO2: 34 mmol/L — ABNORMAL HIGH (ref 22–32)
Calcium: 8.4 mg/dL — ABNORMAL LOW (ref 8.9–10.3)
Chloride: 91 mmol/L — ABNORMAL LOW (ref 98–111)
Creatinine, Ser: 0.93 mg/dL (ref 0.44–1.00)
GFR, Estimated: >60 mL/min (ref >60)
Glucose, Bld: 131 mg/dL — ABNORMAL HIGH (ref 70–99)
Potassium: 4.6 mmol/L (ref 3.5–5.1)
Sodium: 131 mmol/L — ABNORMAL LOW (ref 135–145)

## 2022-11-05 LAB — CBC
HCT: 22.8 % — ABNORMAL LOW (ref 36.0–46.0)
Hemoglobin: 7.3 g/dL — ABNORMAL LOW (ref 12.0–15.0)
MCH: 32.6 pg (ref 26.0–34.0)
MCHC: 32 g/dL (ref 30.0–36.0)
MCV: 101.8 fL — ABNORMAL HIGH (ref 80.0–100.0)
Platelets: 118 10*3/uL — ABNORMAL LOW (ref 150–400)
RBC: 2.24 MIL/uL — ABNORMAL LOW (ref 3.87–5.11)
RDW: 15.7 % — ABNORMAL HIGH (ref 11.5–15.5)
WBC: 5.6 10*3/uL (ref 4.0–10.5)
nRBC: 0 % (ref 0.0–0.2)

## 2022-11-05 LAB — MAGNESIUM: Magnesium: 2 mg/dL (ref 1.7–2.4)

## 2022-11-05 NOTE — Progress Notes (Signed)
Pulmonary Critical Care Medicine [facility] [place]   PULMONARY CRITICAL CARE SERVICE  PROGRESS NOTE     [name]  QBH:[id number]  DOB: 08-20-1951   DOA: 09/27/2022  Referring Physician: Luna Kitchens, MD  HPI: [name] is a 71 y.o. female being followed for ventilator/airway/oxygen weaning Acute on Chronic Respiratory Failure.  Patient is capping will be completing 72 hours doing relatively well  Medications: Reviewed on Rounds  Physical Exam:  Vitals: Temperature is 97.8 pulse 60 respiratory 22 blood pressure is 163/51 saturations 100%  Ventilator Settings capping 2 L  General: Comfortable at this time Neck: supple Cardiovascular: no malignant arrhythmias Respiratory: No rhonchi no rales are noted Skin: no rash seen on limited exam Musculoskeletal: No gross abnormality Psychiatric:unable to assess Neurologic:no involuntary movements         Lab Data:   Basic Metabolic Panel: Recent Labs  Lab 10/31/22 0105 11/02/22 0151 11/05/22 0128  NA 137 138 131*  K 4.9 4.9 4.6  CL 93* 93* 91*  CO2 38* 37* 34*  GLUCOSE 102* 133* 131*  BUN 31* 43* 41*  CREATININE 0.79 0.87 0.93  CALCIUM 9.3 9.1 8.4*  MG 1.9 1.8 2.0    ABG: No results for input(s): "PHART", "PCO2ART", "PO2ART", "HCO3", "O2SAT" in the last 168 hours.  Liver Function Tests: No results for input(s): "AST", "ALT", "ALKPHOS", "BILITOT", "PROT", "ALBUMIN" in the last 168 hours. No results for input(s): "LIPASE", "AMYLASE" in the last 168 hours. No results for input(s): "AMMONIA" in the last 168 hours.  CBC: Recent Labs  Lab 10/31/22 0105 11/02/22 0151 11/05/22 0128  WBC 5.9 5.2 5.6  HGB 7.8* 7.4* 7.3*  HCT 24.8* 23.2* 22.8*  MCV 104.2* 103.1* 101.8*  PLT 148* 132* 118*    Cardiac Enzymes: No results for input(s): "CKTOTAL", "CKMB", "CKMBINDEX", "TROPONINI" in the last 168 hours.  BNP (last 3 results) No results for input(s): "BNP" in the last 8760  hours.  ProBNP (last 3 results) No results for input(s): "PROBNP" in the last 8760 hours.  Radiological Exams: No results found.  Assessment/Plan Active Problems:   Acute on chronic respiratory failure with hypoxia (HCC)   Multifocal pneumonia   Tracheostomy status (HCC)   Acute metabolic encephalopathy   COPD, severe (HCC)   Acute on chronic respiratory failure hypoxia plan continue with capping for 1 more day will reassess again tomorrow to see about decannulation Multifocal pneumonia has improved we will continue to follow along closely Tracheostomy remains in place doing well with capping Metabolic encephalopathy no change Severe COPD inhalers and nebulizers as needed   I have personally seen and evaluated the patient, evaluated laboratory and imaging results, formulated the assessment and plan and placed orders. The Patient requires high complexity decision making with multiple systems involvement.  Rounds were done with the Respiratory Therapy Director and Staff therapists and discussed with nursing staff also.  Yevonne Pax, MD [facility] Pulmonary Critical Care Medicine Sleep Medicine

## 2022-11-06 DIAGNOSIS — J9621 Acute and chronic respiratory failure with hypoxia: Secondary | ICD-10-CM | POA: Diagnosis not present

## 2022-11-06 DIAGNOSIS — J189 Pneumonia, unspecified organism: Secondary | ICD-10-CM | POA: Diagnosis not present

## 2022-11-06 DIAGNOSIS — G9341 Metabolic encephalopathy: Secondary | ICD-10-CM | POA: Diagnosis not present

## 2022-11-06 DIAGNOSIS — J449 Chronic obstructive pulmonary disease, unspecified: Secondary | ICD-10-CM | POA: Diagnosis not present

## 2022-11-06 NOTE — Progress Notes (Signed)
Pulmonary Critical Care Medicine [facility] [place]   PULMONARY CRITICAL CARE SERVICE  PROGRESS NOTE     [name]  [id number]:[id number]  DOB: [birth year]   DOA: 09/27/2022  Referring Physician: Luna Kitchens, MD  HPI: [name] is a 71 y.o. female being followed for ventilator/airway/oxygen weaning Acute on Chronic Respiratory Failure.  Patient is capping currently on BiPAP has been doing relatively well  Medications: Reviewed on Rounds  Physical Exam:  Vitals: Temperature is 97.7 pulse of 87 respiratory rate 17 blood pressure 107/50 saturations 100%  Ventilator Settings capping off the ventilator  General: Comfortable at this time Neck: supple Cardiovascular: no malignant arrhythmias Respiratory: No rhonchi or rales are noted at this time Skin: no rash seen on limited exam Musculoskeletal: No gross abnormality Psychiatric:unable to assess Neurologic:no involuntary movements         Lab Data:   Basic Metabolic Panel: Recent Labs  Lab 10/31/22 0105 11/02/22 0151 11/05/22 0128  NA 137 138 131*  K 4.9 4.9 4.6  CL 93* 93* 91*  CO2 38* 37* 34*  GLUCOSE 102* 133* 131*  BUN 31* 43* 41*  CREATININE 0.79 0.87 0.93  CALCIUM 9.3 9.1 8.4*  MG 1.9 1.8 2.0    ABG: No results for input(s): "PHART", "PCO2ART", "PO2ART", "HCO3", "O2SAT" in the last 168 hours.  Liver Function Tests: No results for input(s): "AST", "ALT", "ALKPHOS", "BILITOT", "PROT", "ALBUMIN" in the last 168 hours. No results for input(s): "LIPASE", "AMYLASE" in the last 168 hours. No results for input(s): "AMMONIA" in the last 168 hours.  CBC: Recent Labs  Lab 10/31/22 0105 11/02/22 0151 11/05/22 0128  WBC 5.9 5.2 5.6  HGB 7.8* 7.4* 7.3*  HCT 24.8* 23.2* 22.8*  MCV 104.2* 103.1* 101.8*  PLT 148* 132* 118*    Cardiac Enzymes: No results for input(s): "CKTOTAL", "CKMB", "CKMBINDEX", "TROPONINI" in the last 168 hours.  BNP (last 3 results) No results for  input(s): "BNP" in the last 8760 hours.  ProBNP (last 3 results) No results for input(s): "PROBNP" in the last 8760 hours.  Radiological Exams: No results found.  Assessment/Plan Active Problems:   Acute on chronic respiratory failure with hypoxia (HCC)   Multifocal pneumonia   Tracheostomy status (HCC)   Acute metabolic encephalopathy   COPD, severe (HCC)   Acute on chronic respiratory failure hypoxia patient is doing well this morning she was on BiPAP plan is going to be to continue to encourage compliance with BiPAP working towards eventual decannulation Multifocal pneumonia clinically improved doing well Tracheostomy doing well with capping Metabolic encephalopathy no change we will continue to follow along Severe COPD medical management we will continue with supportive care   I have personally seen and evaluated the patient, evaluated laboratory and imaging results, formulated the assessment and plan and placed orders. The Patient requires high complexity decision making with multiple systems involvement.  Rounds were done with the Respiratory Therapy Director and Staff therapists and discussed with nursing staff also.  Yevonne Pax, MD [facility] Pulmonary Critical Care Medicine Sleep Medicine

## 2022-11-07 DIAGNOSIS — J189 Pneumonia, unspecified organism: Secondary | ICD-10-CM | POA: Diagnosis not present

## 2022-11-07 DIAGNOSIS — J9621 Acute and chronic respiratory failure with hypoxia: Secondary | ICD-10-CM | POA: Diagnosis not present

## 2022-11-07 DIAGNOSIS — J449 Chronic obstructive pulmonary disease, unspecified: Secondary | ICD-10-CM | POA: Diagnosis not present

## 2022-11-07 DIAGNOSIS — G9341 Metabolic encephalopathy: Secondary | ICD-10-CM | POA: Diagnosis not present

## 2022-11-07 NOTE — Progress Notes (Signed)
Pulmonary Critical Care Medicine [facility] [place]   PULMONARY CRITICAL CARE SERVICE  PROGRESS NOTE     [name]  [id number]:[id number]  DOB: 12/07/1951   DOA: 09/27/2022  Referring Physician: Luna Kitchens, MD  HPI: [name] is a 71 y.o. female being followed for ventilator/airway/oxygen weaning Acute on Chronic Respiratory Failure.  Patient currently is capping doing well is ready for decannulation  Medications: Reviewed on Rounds  Physical Exam:  Vitals: Temperature is 97.2 pulse 56 respiratory 19 blood pressure 120/59 saturations 100%  Ventilator Settings capping off the ventilator  General: Comfortable at this time Neck: supple Cardiovascular: no malignant arrhythmias Respiratory: Data has been reviewed Skin: no rash seen on limited exam Musculoskeletal: No gross abnormality Psychiatric:unable to assess Neurologic:no involuntary movements         Lab Data:   Basic Metabolic Panel: Recent Labs  Lab 11/02/22 0151 11/05/22 0128  NA 138 131*  K 4.9 4.6  CL 93* 91*  CO2 37* 34*  GLUCOSE 133* 131*  BUN 43* 41*  CREATININE 0.87 0.93  CALCIUM 9.1 8.4*  MG 1.8 2.0    ABG: No results for input(s): "PHART", "PCO2ART", "PO2ART", "HCO3", "O2SAT" in the last 168 hours.  Liver Function Tests: No results for input(s): "AST", "ALT", "ALKPHOS", "BILITOT", "PROT", "ALBUMIN" in the last 168 hours. No results for input(s): "LIPASE", "AMYLASE" in the last 168 hours. No results for input(s): "AMMONIA" in the last 168 hours.  CBC: Recent Labs  Lab 11/02/22 0151 11/05/22 0128  WBC 5.2 5.6  HGB 7.4* 7.3*  HCT 23.2* 22.8*  MCV 103.1* 101.8*  PLT 132* 118*    Cardiac Enzymes: No results for input(s): "CKTOTAL", "CKMB", "CKMBINDEX", "TROPONINI" in the last 168 hours.  BNP (last 3 results) No results for input(s): "BNP" in the last 8760 hours.  ProBNP (last 3 results) No results for input(s): "PROBNP" in the last 8760  hours.  Radiological Exams: No results found.  Assessment/Plan Active Problems:   Acute on chronic respiratory failure with hypoxia (HCC)   Multifocal pneumonia   Tracheostomy status (HCC)   Acute metabolic encephalopathy   COPD, severe (HCC)   Acute on chronic respiratory failure with hypoxia we will continue with the weaning process and proceed to decannulation Tracheostomy will be removed today Multifocal pneumonia has been treated clinically improved Metabolic encephalopathy no change we will continue to follow along Severe COPD medical management we will continue with supportive care   I have personally seen and evaluated the patient, evaluated laboratory and imaging results, formulated the assessment and plan and placed orders. The Patient requires high complexity decision making with multiple systems involvement.  Rounds were done with the Respiratory Therapy Director and Staff therapists and discussed with nursing staff also.  Yevonne Pax, MD [facility] Pulmonary Critical Care Medicine Sleep Medicine

## 2022-11-08 DIAGNOSIS — J449 Chronic obstructive pulmonary disease, unspecified: Secondary | ICD-10-CM | POA: Diagnosis not present

## 2022-11-08 DIAGNOSIS — J9621 Acute and chronic respiratory failure with hypoxia: Secondary | ICD-10-CM | POA: Diagnosis not present

## 2022-11-08 DIAGNOSIS — G9341 Metabolic encephalopathy: Secondary | ICD-10-CM | POA: Diagnosis not present

## 2022-11-08 DIAGNOSIS — J189 Pneumonia, unspecified organism: Secondary | ICD-10-CM | POA: Diagnosis not present

## 2022-11-08 LAB — CBC
HCT: 22.6 % — ABNORMAL LOW (ref 36.0–46.0)
Hemoglobin: 7.3 g/dL — ABNORMAL LOW (ref 12.0–15.0)
MCH: 32.4 pg (ref 26.0–34.0)
MCHC: 32.3 g/dL (ref 30.0–36.0)
MCV: 100.4 fL — ABNORMAL HIGH (ref 80.0–100.0)
Platelets: 125 10*3/uL — ABNORMAL LOW (ref 150–400)
RBC: 2.25 MIL/uL — ABNORMAL LOW (ref 3.87–5.11)
RDW: 15.4 % (ref 11.5–15.5)
WBC: 6.8 10*3/uL (ref 4.0–10.5)
nRBC: 0 % (ref 0.0–0.2)

## 2022-11-08 LAB — BASIC METABOLIC PANEL
Anion gap: 10 (ref 5–15)
BUN: 38 mg/dL — ABNORMAL HIGH (ref 8–23)
CO2: 34 mmol/L — ABNORMAL HIGH (ref 22–32)
Calcium: 8.5 mg/dL — ABNORMAL LOW (ref 8.9–10.3)
Chloride: 89 mmol/L — ABNORMAL LOW (ref 98–111)
Creatinine, Ser: 0.96 mg/dL (ref 0.44–1.00)
GFR, Estimated: >60 mL/min (ref >60)
Glucose, Bld: 124 mg/dL — ABNORMAL HIGH (ref 70–99)
Potassium: 4.7 mmol/L (ref 3.5–5.1)
Sodium: 133 mmol/L — ABNORMAL LOW (ref 135–145)

## 2022-11-08 NOTE — Progress Notes (Signed)
Pulmonary Critical Care Medicine [facility] [place]   PULMONARY CRITICAL CARE SERVICE  PROGRESS NOTE     [name]  WFU:[id number]  DOB: [birth year]   DOA: 09/27/2022  Referring Physician: Luna Kitchens, MD  HPI: [name] is a 71 y.o. female being followed for ventilator/airway/oxygen weaning Acute on Chronic Respiratory Failure.  Patient was using the BiPAP at night doing better.  She was decannulated successfully  Medications: Reviewed on Rounds  Physical Exam:  Vitals: Temperature is 97.6 pulse 61 respiratory 20 blood pressure 139/61 saturations 97%  Ventilator Settings on BiPAP at night  General: Comfortable at this time Neck: supple Cardiovascular: no malignant arrhythmias Respiratory: No rhonchi no rales are noted at this time Skin: no rash seen on limited exam Musculoskeletal: No gross abnormality Psychiatric:unable to assess Neurologic:no involuntary movements         Lab Data:   Basic Metabolic Panel: Recent Labs  Lab 11/02/22 0151 11/05/22 0128 11/08/22 0128  NA 138 131* 133*  K 4.9 4.6 4.7  CL 93* 91* 89*  CO2 37* 34* 34*  GLUCOSE 133* 131* 124*  BUN 43* 41* 38*  CREATININE 0.87 0.93 0.96  CALCIUM 9.1 8.4* 8.5*  MG 1.8 2.0  --     ABG: No results for input(s): "PHART", "PCO2ART", "PO2ART", "HCO3", "O2SAT" in the last 168 hours.  Liver Function Tests: No results for input(s): "AST", "ALT", "ALKPHOS", "BILITOT", "PROT", "ALBUMIN" in the last 168 hours. No results for input(s): "LIPASE", "AMYLASE" in the last 168 hours. No results for input(s): "AMMONIA" in the last 168 hours.  CBC: Recent Labs  Lab 11/02/22 0151 11/05/22 0128 11/08/22 0128  WBC 5.2 5.6 6.8  HGB 7.4* 7.3* 7.3*  HCT 23.2* 22.8* 22.6*  MCV 103.1* 101.8* 100.4*  PLT 132* 118* 125*    Cardiac Enzymes: No results for input(s): "CKTOTAL", "CKMB", "CKMBINDEX", "TROPONINI" in the last 168 hours.  BNP (last 3 results) No results for  input(s): "BNP" in the last 8760 hours.  ProBNP (last 3 results) No results for input(s): "PROBNP" in the last 8760 hours.  Radiological Exams: No results found.  Assessment/Plan Active Problems:   Acute on chronic respiratory failure with hypoxia (HCC)   Multifocal pneumonia   Tracheostomy status (HCC)   Acute metabolic encephalopathy   COPD, severe (HCC)   Acute on chronic respiratory failure hypoxia we will continue with using BiPAP as ordered continue secretion management pulmonary toilet. Multifocal pneumonia has been treated seems to be improving we will continue to follow Tracheostomy no change we will continue with supportive care Metabolic encephalopathy no change we will continue with supportive care Severe COPD medical management   I have personally seen and evaluated the patient, evaluated laboratory and imaging results, formulated the assessment and plan and placed orders. The Patient requires high complexity decision making with multiple systems involvement.  Rounds were done with the Respiratory Therapy Director and Staff therapists and discussed with nursing staff also.  Yevonne Pax, MD [facility] Pulmonary Critical Care Medicine Sleep Medicine

## 2022-11-09 ENCOUNTER — Other Ambulatory Visit: Payer: Self-pay

## 2022-11-09 ENCOUNTER — Other Ambulatory Visit (HOSPITAL_COMMUNITY): Payer: Medicare Other

## 2022-11-09 DIAGNOSIS — G4733 Obstructive sleep apnea (adult) (pediatric): Secondary | ICD-10-CM | POA: Insufficient documentation

## 2022-11-09 DIAGNOSIS — J9621 Acute and chronic respiratory failure with hypoxia: Secondary | ICD-10-CM | POA: Diagnosis not present

## 2022-11-09 DIAGNOSIS — J449 Chronic obstructive pulmonary disease, unspecified: Secondary | ICD-10-CM | POA: Diagnosis not present

## 2022-11-09 DIAGNOSIS — G9341 Metabolic encephalopathy: Secondary | ICD-10-CM | POA: Diagnosis not present

## 2022-11-09 DIAGNOSIS — J189 Pneumonia, unspecified organism: Secondary | ICD-10-CM | POA: Diagnosis not present

## 2022-11-09 NOTE — Consult Note (Signed)
Referring Physician: Satira Sark, MD  [name] is an 71 y.o. female.                       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 Her heart rate drops to 40's without drop in blood pressure and symptoms as she is bed ridden.  Past Medical History:  Diagnosis Date   Abdominal pain, other specified site   Acquired absence of both cervix and uterus   Acquired cyst of kidney 10/28/2011   Acute knee pain   Asthma   Bladder disorder   Cataract   Cellulitis left leg   Cellulitis of lower leg   Cirrhosis ([place])   Constipation 10/28/2011   COPD (chronic obstructive pulmonary disease) (HCC)   Depression with anxiety   Diabetes mellitus, Hgb A1C 5.3 %  Dyslipidemia   GERD (gastroesophageal reflux disease)   History of blood clots   Hypertension   Incontinence   Iron deficiency   Lateral meniscal tear left   Left hip pain   Left wrist injury   Leg cramps   Leg pain, left   Medial meniscus tear left   MRSA nasal colonization   Obesity morbid   Observed sleep apnea   Osteoarthritis left hip and knee   PONV (postoperative nausea and vomiting)   Restless leg syndrome   Right shoulder injury   Sleep apnea   Stasis dermatitis chronic   Transfusion history   Urge incontinence 10/28/2011   Urgency of urination 10/28/2011   Venous stasis dermatitis of both lower extremities   Social History   Socioeconomic History   Marital status: Widowed  Spouse name: Not on file   Number of children: Not on file   Years of education: Not on file   Highest education level: Not on file  Occupational History   Not on file  Tobacco Use   Smoking status: Never   Smokeless tobacco: Never  Substance and Sexual Activity   Alcohol use: No   Drug use: No   Sexual activity: Not on file  Other Topics Concern   Not on file   Social History Narrative   Not on file   Social Determinants of Health   Financial Resource Strain: Not on file  Food Insecurity: Not on file  Transportation Needs: Not on file  Physical Activity: Not on file  Stress: Not on file  Social Connections: Not on file  Housing Stability: Not on file   Family History  Problem Relation Age of Onset   Diabetes Mother   Cancer Sister   Cataracts Sister   Hypertension Sister   Macular degeneration Sister   Thyroid disease Sister   Cancer Maternal Grandmother   Breast cancer Maternal Grandmother   Diabetes Daughter   Anesthesia problems Neg Hx   Eczema Neg Hx   Psoriasis Neg Hx      Allergies: Amlodipine, Clindamycin, Seroquel, Sulfa antibiotics, Allopurinol, Lovastatin and Doxycycline.  No medications prior to admission.  See list on chart. No calcium channel or B-blocker listed.  Results for orders placed or performed during the hospital encounter of 09/27/22 (from the past 48 hour(s))  Basic metabolic panel     Status: Abnormal   Collection Time: 11/08/22  1:28 AM  Result Value Ref Range   Sodium 133 (L) 135 - 145  mmol/L   Potassium 4.7 3.5 - 5.1 mmol/L   Chloride 89 (L) 98 - 111 mmol/L   CO2 34 (H) 22 - 32 mmol/L   Glucose, Bld 124 (H) 70 - 99 mg/dL    Comment: Glucose reference range applies only to samples taken after fasting for at least 8 hours.   BUN 38 (H) 8 - 23 mg/dL   Creatinine, Ser 1.61 0.44 - 1.00 mg/dL   Calcium 8.5 (L) 8.9 - 10.3 mg/dL   GFR, Estimated >09 >60 mL/min    Comment: (NOTE) Calculated using the CKD-EPI Creatinine Equation (2021)    Anion gap 10 5 - 15    Comment: Performed at [facility], [address], [place], [place] [address]  CBC     Status: Abnormal   Collection Time: 11/08/22  1:28 AM  Result Value Ref Range   WBC 6.8 4.0 - 10.5 K/uL   RBC 2.25 (L) 3.87 - 5.11 MIL/uL   Hemoglobin 7.3 (L) 12.0 - 15.0 g/dL   HCT 81.1 (L) 91.4 - 78.2 %   MCV 100.4 (H) 80.0 - 100.0 fL   MCH  32.4 26.0 - 34.0 pg   MCHC 32.3 30.0 - 36.0 g/dL   RDW 95.6 21.3 - 08.6 %   Platelets 125 (L) 150 - 400 K/uL   nRBC 0.0 0.0 - 0.2 %    Comment: Performed at [facility], [address] [place], [place] [address]   DG CHEST PORT 1 VIEW  Result Date: 11/09/2022 CLINICAL DATA:  Shortness of breath EXAM: PORTABLE CHEST 1 VIEW COMPARISON:  Portable exam 1648 hours compared to 10/25/2022 FINDINGS: Enlargement of cardiac silhouette. Mediastinal contours and pulmonary vascularity normal. Atherosclerotic calcification aorta. Mild persistent opacity retrocardiac LEFT lower lobe unchanged. Remaining lungs clear, with improvement in RIGHT upper lobe pneumonia since previous study. No new infiltrate, pleural effusion, or pneumothorax. IMPRESSION: Persistent atelectasis versus infiltrate LEFT lower lobe. Improved RIGHT upper lobe infiltrate without new abnormalities. Electronically Signed   By: Ulyses Southward M.D.   On: 11/09/2022 17:11    Review Of Systems As per HPI and PMH.  P: 45, R:26, BP: 134/60, O2 sat 94 % There were no vitals taken for this visit. There is no height or weight on file to calculate BMI. General appearance: alert, cooperative, appears stated age and no distress Head: Normocephalic, atraumatic. Eyes: Brown/Blue/Hazel eyes, pink conjunctiva, corneas clear. PERRL, EOM's intact. Neck: No adenopathy, no carotid bruit, no JVD, supple, symmetrical, trachea midline and thyroid not enlarged. Resp: Clear to auscultation bilaterally. Cardio: Regular rate and rhythm, S1, S2 normal, II/VI systolic murmur, no click, rub or gallop GI: Soft, non-tender; bowel sounds normal; no organomegaly. Extremities: No edema, cyanosis or clubbing. Skin: Warm and dry.  Neurologic: Alert and oriented X 3, normal strength. Normal coordination and gait.  Assessment/Plan Sinus bradycardia, asymptomatic Acute on chronic respiratory failure with hypoxia S/P tracheostomy HTN HLD Morbid Obesity Anemia of  chronic disease  Monitor rhythm. Continue medical treatment for now.  Time spent: Review of old records, Lab, x-rays, EKG, other cardiac tests, examination, discussion with patient/Nurse over 70 minutes.  Ricki Rodriguez, MD  11/09/2022, 10:05 PM

## 2022-11-09 NOTE — Progress Notes (Signed)
Pulmonary Critical Care Medicine [facility] [place]   PULMONARY CRITICAL CARE SERVICE  PROGRESS NOTE     [name]  YOV:[id number]  DOB: Dec 31, 1950   DOA: 09/27/2022  Referring Physician: Luna Kitchens, MD  HPI: [name] is a 71 y.o. female being followed for ventilator/airway/oxygen weaning Acute on Chronic Respiratory Failure.  Patient is comfortable without distress at this time has been on BiPAP at night off BiPAP during daytime while awake  Medications: Reviewed on Rounds  Physical Exam:  Vitals: Temperature is 97.2 pulse 56 respiratory 20 blood pressure is 118/58 saturations 100%  Ventilator Settings on BiPAP at night  General: Comfortable at this time Neck: supple Cardiovascular: no malignant arrhythmias Respiratory: No rhonchi no rales are noted at this time Skin: no rash seen on limited exam Musculoskeletal: No gross abnormality Psychiatric:unable to assess Neurologic:no involuntary movements         Lab Data:   Basic Metabolic Panel: Recent Labs  Lab 11/05/22 0128 11/08/22 0128  NA 131* 133*  K 4.6 4.7  CL 91* 89*  CO2 34* 34*  GLUCOSE 131* 124*  BUN 41* 38*  CREATININE 0.93 0.96  CALCIUM 8.4* 8.5*  MG 2.0  --     ABG: No results for input(s): "PHART", "PCO2ART", "PO2ART", "HCO3", "O2SAT" in the last 168 hours.  Liver Function Tests: No results for input(s): "AST", "ALT", "ALKPHOS", "BILITOT", "PROT", "ALBUMIN" in the last 168 hours. No results for input(s): "LIPASE", "AMYLASE" in the last 168 hours. No results for input(s): "AMMONIA" in the last 168 hours.  CBC: Recent Labs  Lab 11/05/22 0128 11/08/22 0128  WBC 5.6 6.8  HGB 7.3* 7.3*  HCT 22.8* 22.6*  MCV 101.8* 100.4*  PLT 118* 125*    Cardiac Enzymes: No results for input(s): "CKTOTAL", "CKMB", "CKMBINDEX", "TROPONINI" in the last 168 hours.  BNP (last 3 results) No results for input(s): "BNP" in the last 8760 hours.  ProBNP (last 3  results) No results for input(s): "PROBNP" in the last 8760 hours.  Radiological Exams: No results found.  Assessment/Plan Active Problems:   Acute on chronic respiratory failure with hypoxia (HCC)   Multifocal pneumonia   Acute metabolic encephalopathy   COPD, severe (HCC)   Obstructive sleep apnea   Acute on chronic respiratory failure hypoxia we will continue with the BiPAP therapy at nighttime.  Patient has had no desaturation issues during the daytime Multifocal pneumonia has been treated Acute metabolic encephalopathy at baseline Severe COPD at baseline Struct of sleep apnea no change using BiPAP at night   I have personally seen and evaluated the patient, evaluated laboratory and imaging results, formulated the assessment and plan and placed orders. The Patient requires high complexity decision making with multiple systems involvement.  Rounds were done with the Respiratory Therapy Director and Staff therapists and discussed with nursing staff also.  Yevonne Pax, MD [facility] Pulmonary Critical Care Medicine Sleep Medicine

## 2022-11-10 ENCOUNTER — Other Ambulatory Visit (HOSPITAL_COMMUNITY): Payer: Medicare Other

## 2022-11-10 DIAGNOSIS — J449 Chronic obstructive pulmonary disease, unspecified: Secondary | ICD-10-CM | POA: Diagnosis not present

## 2022-11-10 DIAGNOSIS — G9341 Metabolic encephalopathy: Secondary | ICD-10-CM | POA: Diagnosis not present

## 2022-11-10 DIAGNOSIS — J9621 Acute and chronic respiratory failure with hypoxia: Secondary | ICD-10-CM | POA: Diagnosis not present

## 2022-11-10 DIAGNOSIS — J189 Pneumonia, unspecified organism: Secondary | ICD-10-CM | POA: Diagnosis not present

## 2022-11-10 LAB — BASIC METABOLIC PANEL
Anion gap: 11 (ref 5–15)
Anion gap: 10 (ref 5–15)
BUN: 43 mg/dL — ABNORMAL HIGH (ref 8–23)
BUN: 46 mg/dL — ABNORMAL HIGH (ref 8–23)
CO2: 32 mmol/L (ref 22–32)
CO2: 32 mmol/L (ref 22–32)
Calcium: 8.5 mg/dL — ABNORMAL LOW (ref 8.9–10.3)
Calcium: 8.3 mg/dL — ABNORMAL LOW (ref 8.9–10.3)
Chloride: 84 mmol/L — ABNORMAL LOW (ref 98–111)
Chloride: 82 mmol/L — ABNORMAL LOW (ref 98–111)
Creatinine, Ser: 1.08 mg/dL — ABNORMAL HIGH (ref 0.44–1.00)
Creatinine, Ser: 1.21 mg/dL — ABNORMAL HIGH (ref 0.44–1.00)
GFR, Estimated: 55 mL/min — ABNORMAL LOW (ref >60)
GFR, Estimated: 48 mL/min — ABNORMAL LOW (ref >60)
Glucose, Bld: 126 mg/dL — ABNORMAL HIGH (ref 70–99)
Glucose, Bld: 124 mg/dL — ABNORMAL HIGH (ref 70–99)
Potassium: 6.2 mmol/L — ABNORMAL HIGH (ref 3.5–5.1)
Potassium: 6.2 mmol/L — ABNORMAL HIGH (ref 3.5–5.1)
Sodium: 127 mmol/L — ABNORMAL LOW (ref 135–145)
Sodium: 124 mmol/L — ABNORMAL LOW (ref 135–145)

## 2022-11-10 LAB — ECHOCARDIOGRAM COMPLETE
AR max vel: 0.7 cm2
AV Area VTI: 0.65 cm2
AV Area mean vel: 0.75 cm2
AV Mean grad: 7.5 mmHg
AV Peak grad: 15.4 mmHg
Ao pk vel: 1.96 m/s
Area-P 1/2: 2.49 cm2
MV M vel: 3.89 m/s
MV Peak grad: 60.5 mmHg
S' Lateral: 3.4 cm

## 2022-11-10 LAB — CBC
HCT: 28.5 % — ABNORMAL LOW (ref 36.0–46.0)
HCT: 29.1 % — ABNORMAL LOW (ref 36.0–46.0)
Hemoglobin: 9.2 g/dL — ABNORMAL LOW (ref 12.0–15.0)
Hemoglobin: 9.2 g/dL — ABNORMAL LOW (ref 12.0–15.0)
MCH: 32.6 pg (ref 26.0–34.0)
MCH: 32.3 pg (ref 26.0–34.0)
MCHC: 32.3 g/dL (ref 30.0–36.0)
MCHC: 31.6 g/dL (ref 30.0–36.0)
MCV: 101.1 fL — ABNORMAL HIGH (ref 80.0–100.0)
MCV: 102.1 fL — ABNORMAL HIGH (ref 80.0–100.0)
Platelets: 144 10*3/uL — ABNORMAL LOW (ref 150–400)
Platelets: 162 10*3/uL (ref 150–400)
RBC: 2.82 MIL/uL — ABNORMAL LOW (ref 3.87–5.11)
RBC: 2.85 MIL/uL — ABNORMAL LOW (ref 3.87–5.11)
RDW: 15.3 % (ref 11.5–15.5)
RDW: 15.4 % (ref 11.5–15.5)
WBC: 9.6 10*3/uL (ref 4.0–10.5)
WBC: 9.2 10*3/uL (ref 4.0–10.5)
nRBC: 0 % (ref 0.0–0.2)
nRBC: 0 % (ref 0.0–0.2)

## 2022-11-10 LAB — MAGNESIUM: Magnesium: 2 mg/dL (ref 1.7–2.4)

## 2022-11-10 MED ORDER — PERFLUTREN LIPID MICROSPHERE
1.0000 mL | INTRAVENOUS | Status: AC | PRN
  Administered 2022-11-10: 3 mL via INTRAVENOUS

## 2022-11-10 NOTE — Progress Notes (Signed)
Pulmonary Critical Care Medicine [facility] [place]   PULMONARY CRITICAL CARE SERVICE  PROGRESS NOTE     [name]  ZOX:[id number]  DOB: 02/10/1951   DOA: 09/27/2022  Referring Physician: Luna Kitchens, MD  HPI: [name] is a 71 y.o. female being followed for ventilator/airway/oxygen weaning Acute on Chronic Respiratory Failure. ***  Medications: Reviewed on Rounds  Physical Exam:  Vitals: ***  Ventilator Settings ***  General: Comfortable at this time Neck: supple Cardiovascular: no malignant arrhythmias Respiratory: *** Skin: no rash seen on limited exam Musculoskeletal: No gross abnormality Psychiatric:unable to assess Neurologic:no involuntary movements         Lab Data:   Basic Metabolic Panel: Recent Labs  Lab 11/05/22 0128 11/08/22 0128 11/10/22 1517  NA 131* 133* 127*  K 4.6 4.7 6.2*  CL 91* 89* 84*  CO2 34* 34* 32  GLUCOSE 131* 124* 126*  BUN 41* 38* 43*  CREATININE 0.93 0.96 1.08*  CALCIUM 8.4* 8.5* 8.5*  MG 2.0  --  2.0    ABG: No results for input(s): "PHART", "PCO2ART", "PO2ART", "HCO3", "O2SAT" in the last 168 hours.  Liver Function Tests: No results for input(s): "AST", "ALT", "ALKPHOS", "BILITOT", "PROT", "ALBUMIN" in the last 168 hours. No results for input(s): "LIPASE", "AMYLASE" in the last 168 hours. No results for input(s): "AMMONIA" in the last 168 hours.  CBC: Recent Labs  Lab 11/05/22 0128 11/08/22 0128 11/10/22 1517  WBC 5.6 6.8 9.6  HGB 7.3* 7.3* 9.2*  HCT 22.8* 22.6* 28.5*  MCV 101.8* 100.4* 101.1*  PLT 118* 125* 144*    Cardiac Enzymes: No results for input(s): "CKTOTAL", "CKMB", "CKMBINDEX", "TROPONINI" in the last 168 hours.  BNP (last 3 results) No results for input(s): "BNP" in the last 8760 hours.  ProBNP (last 3 results) No results for input(s): "PROBNP" in the last 8760 hours.  Radiological Exams: ECHOCARDIOGRAM COMPLETE  Result Date: 11/10/2022     ECHOCARDIOGRAM REPORT   Patient Name:   [name] Date of Exam: 11/10/2022 Medical Rec #:  [id number]            Height: Accession #:    [id number]           Weight: Date of Birth:  [birth year]            BSA: Patient Age:    71 years             BP:           134/60 mmHg Patient Gender: F                    HR:           52 bpm. Exam Location:  Inpatient Procedure: 2D Echo, Intracardiac Opacification Agent, Cardiac Doppler and Color            Doppler Indications:     Abnormal ECG R94.31  History:         Patient has no prior history of Echocardiogram examinations.                  COPD; Risk Factors:Hypertension, Dyslipidemia and Diabetes.  Sonographer:     Aron Baba Referring Phys:  [id number] Florian Buff BROWN Diagnosing Phys: Orpah Cobb MD IMPRESSIONS  1. Left ventricular ejection fraction, by estimation, is 45 to 50%. The left ventricle has mildly decreased function. The left ventricle demonstrates global hypokinesis. Left ventricular diastolic parameters are indeterminate.  2. Right ventricular systolic function  is normal. The right ventricular size is mildly enlarged. There is mildly elevated pulmonary artery systolic pressure.  3. Left atrial size was moderately dilated.  4. Right atrial size was moderately dilated.  5. The mitral valve is degenerative. Mild mitral valve regurgitation.  6. Tricuspid valve regurgitation is moderate to severe.  7. The aortic valve is tricuspid. There is severe calcifcation of the aortic valve. There is moderate thickening of the aortic valve. Aortic valve regurgitation is not visualized. Moderate to severe aortic valve stenosis.  8. There is mild (Grade II) atheroma plaque involving the ascending aorta.  9. The inferior vena cava is dilated in size with <50% respiratory variability, suggesting right atrial pressure of 15 mmHg. FINDINGS  Left Ventricle: Left ventricular ejection fraction, by estimation, is 45 to 50%. The left ventricle has mildly decreased  function. The left ventricle demonstrates global hypokinesis. The left ventricular internal cavity size was normal in size. There is  borderline concentric left ventricular hypertrophy. Left ventricular diastolic parameters are indeterminate. Right Ventricle: The right ventricular size is mildly enlarged. No increase in right ventricular wall thickness. Right ventricular systolic function is normal. There is mildly elevated pulmonary artery systolic pressure. The tricuspid regurgitant velocity is 2.97 m/s, and with an assumed right atrial pressure of 3 mmHg, the estimated right ventricular systolic pressure is 38.3 mmHg. Left Atrium: Left atrial size was moderately dilated. Right Atrium: Right atrial size was moderately dilated. Pericardium: There is no evidence of pericardial effusion. Mitral Valve: The mitral valve is degenerative in appearance. Mild mitral valve regurgitation. Tricuspid Valve: The tricuspid valve is normal in structure. Tricuspid valve regurgitation is moderate to severe. Aortic Valve: The aortic valve is tricuspid. There is severe calcifcation of the aortic valve. There is moderate thickening of the aortic valve. There is moderate aortic valve annular calcification. Aortic valve regurgitation is not visualized. Moderate to severe aortic stenosis is present. Aortic valve mean gradient measures 7.5 mmHg. Aortic valve peak gradient measures 15.4 mmHg. Aortic valve area, by VTI measures 0.65 cm. Pulmonic Valve: The pulmonic valve was normal in structure. Pulmonic valve regurgitation is not visualized. Aorta: The aortic root is normal in size and structure. There is mild (Grade II) atheroma plaque involving the ascending aorta. Venous: The inferior vena cava is dilated in size with less than 50% respiratory variability, suggesting right atrial pressure of 15 mmHg. IAS/Shunts: The atrial septum is grossly normal.  LEFT VENTRICLE PLAX 2D LVIDd:         4.40 cm   Diastology LVIDs:         3.40 cm   LV  e' medial:    6.85 cm/s LV PW:         1.00 cm   LV E/e' medial:  17.2 LV IVS:        1.10 cm   LV e' lateral:   6.64 cm/s LVOT diam:     1.60 cm   LV E/e' lateral: 17.8 LV SV:         29 LVOT Area:     2.01 cm  RIGHT VENTRICLE RV S prime:     7.72 cm/s TAPSE (M-mode): 0.9 cm LEFT ATRIUM              RIGHT ATRIUM LA diam:        4.50 cm  RA Area:     25.40 cm LA Vol (A2C):   103.0 ml RA Volume:   92.30 ml LA Vol (A4C):   65.9 ml LA  Biplane Vol: 83.4 ml  AORTIC VALVE AV Area (Vmax):    0.70 cm AV Area (Vmean):   0.75 cm AV Area (VTI):     0.65 cm AV Vmax:           196.00 cm/s AV Vmean:          128.500 cm/s AV VTI:            0.449 m AV Peak Grad:      15.4 mmHg AV Mean Grad:      7.5 mmHg LVOT Vmax:         68.00 cm/s LVOT Vmean:        48.100 cm/s LVOT VTI:          0.146 m LVOT/AV VTI ratio: 0.33  AORTA Ao Root diam: 3.30 cm Ao Asc diam:  3.20 cm MITRAL VALVE                TRICUSPID VALVE MV Area (PHT): 2.49 cm     TR Peak grad:   35.3 mmHg MV Decel Time: 305 msec     TR Vmax:        297.00 cm/s MR Peak grad: 60.5 mmHg MR Vmax:      389.00 cm/s   SHUNTS MV E velocity: 118.00 cm/s  Systemic VTI:  0.15 m                             Systemic Diam: 1.60 cm Orpah Cobb MD Electronically signed by Orpah Cobb MD Signature Date/Time: 11/10/2022/2:14:02 PM    Final    DG CHEST PORT 1 VIEW  Result Date: 11/09/2022 CLINICAL DATA:  Shortness of breath EXAM: PORTABLE CHEST 1 VIEW COMPARISON:  Portable exam 1648 hours compared to 10/25/2022 FINDINGS: Enlargement of cardiac silhouette. Mediastinal contours and pulmonary vascularity normal. Atherosclerotic calcification aorta. Mild persistent opacity retrocardiac LEFT lower lobe unchanged. Remaining lungs clear, with improvement in RIGHT upper lobe pneumonia since previous study. No new infiltrate, pleural effusion, or pneumothorax. IMPRESSION: Persistent atelectasis versus infiltrate LEFT lower lobe. Improved RIGHT upper lobe infiltrate without new abnormalities.  Electronically Signed   By: Ulyses Southward M.D.   On: 11/09/2022 17:11    Assessment/Plan Active Problems:   Acute on chronic respiratory failure with hypoxia (HCC)   Multifocal pneumonia   Acute metabolic encephalopathy   COPD, severe (HCC)   Obstructive sleep apnea   *** *** *** *** ***   I have personally seen and evaluated the patient, evaluated laboratory and imaging results, formulated the assessment and plan and placed orders. The Patient requires high complexity decision making with multiple systems involvement.  Rounds were done with the Respiratory Therapy Director and Staff therapists and discussed with nursing staff also.  Yevonne Pax, MD [facility] Pulmonary Critical Care Medicine Sleep Medicine

## 2022-11-11 DIAGNOSIS — G9341 Metabolic encephalopathy: Secondary | ICD-10-CM | POA: Diagnosis not present

## 2022-11-11 DIAGNOSIS — J189 Pneumonia, unspecified organism: Secondary | ICD-10-CM | POA: Diagnosis not present

## 2022-11-11 DIAGNOSIS — J449 Chronic obstructive pulmonary disease, unspecified: Secondary | ICD-10-CM | POA: Diagnosis not present

## 2022-11-11 DIAGNOSIS — J9621 Acute and chronic respiratory failure with hypoxia: Secondary | ICD-10-CM | POA: Diagnosis not present

## 2022-11-11 LAB — BASIC METABOLIC PANEL
Anion gap: 11 (ref 5–15)
BUN: 48 mg/dL — ABNORMAL HIGH (ref 8–23)
CO2: 32 mmol/L (ref 22–32)
Calcium: 8.3 mg/dL — ABNORMAL LOW (ref 8.9–10.3)
Chloride: 81 mmol/L — ABNORMAL LOW (ref 98–111)
Creatinine, Ser: 1.33 mg/dL — ABNORMAL HIGH (ref 0.44–1.00)
GFR, Estimated: 43 mL/min — ABNORMAL LOW (ref >60)
Glucose, Bld: 116 mg/dL — ABNORMAL HIGH (ref 70–99)
Potassium: 6.2 mmol/L — ABNORMAL HIGH (ref 3.5–5.1)
Sodium: 124 mmol/L — ABNORMAL LOW (ref 135–145)

## 2022-11-11 LAB — CBC
HCT: 24.5 % — ABNORMAL LOW (ref 36.0–46.0)
Hemoglobin: 8.4 g/dL — ABNORMAL LOW (ref 12.0–15.0)
MCH: 33.3 pg (ref 26.0–34.0)
MCHC: 34.3 g/dL (ref 30.0–36.0)
MCV: 97.2 fL (ref 80.0–100.0)
Platelets: 166 10*3/uL (ref 150–400)
RBC: 2.52 MIL/uL — ABNORMAL LOW (ref 3.87–5.11)
RDW: 15.5 % (ref 11.5–15.5)
WBC: 9.7 10*3/uL (ref 4.0–10.5)
nRBC: 0 % (ref 0.0–0.2)

## 2022-11-11 LAB — POTASSIUM: Potassium: 4.9 mmol/L (ref 3.5–5.1)

## 2022-11-11 LAB — MAGNESIUM: Magnesium: 2.1 mg/dL (ref 1.7–2.4)

## 2022-11-11 NOTE — Consult Note (Signed)
Ref: Esau Grew, MD   Subjective:  Awake. HR in 40's to 60's. Rare episode of HR in 20's. Patient remains bed ridden and asymptomatic.  Objective:  Vital Signs in the last 24 hours:    Physical Exam: BP Readings from Last 1 Encounters:  No data found for BP     Wt Readings from Last 1 Encounters:  No data found for Wt    Weight change:  There is no height or weight on file to calculate BMI. HEENT: [place]/AT, Eyes-Blue, Conjunctiva-Pink, Sclera-Non-icteric Neck: No JVD, No bruit, Trachea midline. Lungs:  Clear, Bilateral. Cardiac:  Regular rhythm, normal S1 and S2, no S3. II/VI systolic murmur. Abdomen:  Soft, non-tender. BS present. Extremities:  1 + edema present. No cyanosis. No clubbing. Bilateral lower leg dermatitis. CNS: AxOx3, Cranial nerves grossly intact, moves all 4 extremities.  Skin: Warm and dry.   Intake/Output from previous day: No intake/output data recorded.    Lab Results: BMET    Component Value Date/Time   NA 124 (L) 11/10/2022 1922   NA 127 (L) 11/10/2022 1517   NA 133 (L) 11/08/2022 0128   K 6.2 (H) 11/10/2022 1922   K 6.2 (H) 11/10/2022 1517   K 4.7 11/08/2022 0128   CL 82 (L) 11/10/2022 1922   CL 84 (L) 11/10/2022 1517   CL 89 (L) 11/08/2022 0128   CO2 32 11/10/2022 1922   CO2 32 11/10/2022 1517   CO2 34 (H) 11/08/2022 0128   GLUCOSE 124 (H) 11/10/2022 1922   GLUCOSE 126 (H) 11/10/2022 1517   GLUCOSE 124 (H) 11/08/2022 0128   BUN 46 (H) 11/10/2022 1922   BUN 43 (H) 11/10/2022 1517   BUN 38 (H) 11/08/2022 0128   CREATININE 1.21 (H) 11/10/2022 1922   CREATININE 1.08 (H) 11/10/2022 1517   CREATININE 0.96 11/08/2022 0128   CALCIUM 8.3 (L) 11/10/2022 1922   CALCIUM 8.5 (L) 11/10/2022 1517   CALCIUM 8.5 (L) 11/08/2022 0128   GFRNONAA 48 (L) 11/10/2022 1922   GFRNONAA 55 (L) 11/10/2022 1517   GFRNONAA >60 11/08/2022 0128   CBC    Component Value Date/Time   WBC 9.2 11/10/2022 1922   RBC 2.85 (L) 11/10/2022 1922   HGB 9.2 (L)  11/10/2022 1922   HCT 29.1 (L) 11/10/2022 1922   PLT 162 11/10/2022 1922   MCV 102.1 (H) 11/10/2022 1922   MCH 32.3 11/10/2022 1922   MCHC 31.6 11/10/2022 1922   RDW 15.4 11/10/2022 1922   LYMPHSABS 0.7 10/27/2022 0246   MONOABS 0.3 10/27/2022 0246   EOSABS 0.0 10/27/2022 0246   BASOSABS 0.0 10/27/2022 0246   HEPATIC Function Panel Recent Labs    10/11/22 0131  PROT 5.5*  ALBUMIN 2.1*  AST 25  ALT 22  ALKPHOS 69   HEMOGLOBIN A1C Lab Results  Component Value Date   MPG 93.93 10/03/2022   CARDIAC ENZYMES No results found for: "CKTOTAL", "CKMB", "CKMBINDEX", "TROPONINI" BNP No results for input(s): "PROBNP" in the last 8760 hours. TSH No results for input(s): "TSH" in the last 8760 hours. CHOLESTEROL No results for input(s): "CHOL" in the last 8760 hours.  Scheduled Meds: Continuous Infusions: PRN Meds:.  Assessment/Plan: Sinus bradycardia, asymptomatic Mild MR Moderate TR Moderate AV stenosis Acute on chronic respiratory failure with hypoxia S/P tracheostomy HTN HLD Morbid obesity Anemia of chronic disease  Plan: Continue medical treatment   LOS: 0 days   Time spent including chart review, lab review, examination, discussion with patient/doctor : 30 min  Orpah Cobb  MD  11/11/2022, 12:19 AM

## 2022-11-12 DIAGNOSIS — J9621 Acute and chronic respiratory failure with hypoxia: Secondary | ICD-10-CM | POA: Diagnosis not present

## 2022-11-12 DIAGNOSIS — G9341 Metabolic encephalopathy: Secondary | ICD-10-CM | POA: Diagnosis not present

## 2022-11-12 DIAGNOSIS — J449 Chronic obstructive pulmonary disease, unspecified: Secondary | ICD-10-CM | POA: Diagnosis not present

## 2022-11-12 DIAGNOSIS — J189 Pneumonia, unspecified organism: Secondary | ICD-10-CM | POA: Diagnosis not present

## 2022-11-12 LAB — BASIC METABOLIC PANEL
Anion gap: 10 (ref 5–15)
BUN: 42 mg/dL — ABNORMAL HIGH (ref 8–23)
CO2: 32 mmol/L (ref 22–32)
Calcium: 8.1 mg/dL — ABNORMAL LOW (ref 8.9–10.3)
Chloride: 85 mmol/L — ABNORMAL LOW (ref 98–111)
Creatinine, Ser: 1.11 mg/dL — ABNORMAL HIGH (ref 0.44–1.00)
GFR, Estimated: 53 mL/min — ABNORMAL LOW (ref >60)
Glucose, Bld: 140 mg/dL — ABNORMAL HIGH (ref 70–99)
Potassium: 4.9 mmol/L (ref 3.5–5.1)
Sodium: 127 mmol/L — ABNORMAL LOW (ref 135–145)

## 2022-11-12 LAB — CBC
HCT: 24.1 % — ABNORMAL LOW (ref 36.0–46.0)
Hemoglobin: 7.8 g/dL — ABNORMAL LOW (ref 12.0–15.0)
MCH: 31.8 pg (ref 26.0–34.0)
MCHC: 32.4 g/dL (ref 30.0–36.0)
MCV: 98.4 fL (ref 80.0–100.0)
Platelets: 119 10*3/uL — ABNORMAL LOW (ref 150–400)
RBC: 2.45 MIL/uL — ABNORMAL LOW (ref 3.87–5.11)
RDW: 15.2 % (ref 11.5–15.5)
WBC: 6.5 10*3/uL (ref 4.0–10.5)
nRBC: 0 % (ref 0.0–0.2)

## 2022-11-12 NOTE — Progress Notes (Incomplete)
Pulmonary Critical Care Medicine [facility] [place]   PULMONARY CRITICAL CARE SERVICE  PROGRESS NOTE     [name] [name]  ZOX:[id number]  DOB: 04/19/1951   DOA: 09/27/2022  Referring Physician: Luna Kitchens, MD  HPI: [name] is a 71 y.o. female being followed for ventilator/airway/oxygen weaning Acute on Chronic Respiratory Failure. ***  Medications: Reviewed on Rounds  Physical Exam:  Vitals: ***  Ventilator Settings ***  General: Comfortable at this time Neck: supple Cardiovascular: no malignant arrhythmias Respiratory: *** Skin: no rash seen on limited exam Musculoskeletal: No gross abnormality Psychiatric:unable to assess Neurologic:no involuntary movements         Lab Data:   Basic Metabolic Panel: Recent Labs  Lab 11/08/22 0128 11/10/22 1517 11/10/22 1922 11/11/22 0128 11/11/22 1024 11/12/22 0224  NA 133* 127* 124* 124*  --  127*  K 4.7 6.2* 6.2* 6.2* 4.9 4.9  CL 89* 84* 82* 81*  --  85*  CO2 34* 32 32 32  --  32  GLUCOSE 124* 126* 124* 116*  --  140*  BUN 38* 43* 46* 48*  --  42*  CREATININE 0.96 1.08* 1.21* 1.33*  --  1.11*  CALCIUM 8.5* 8.5* 8.3* 8.3*  --  8.1*  MG  --  2.0  --  2.1  --   --     ABG: No results for input(s): "PHART", "PCO2ART", "PO2ART", "HCO3", "O2SAT" in the last 168 hours.  Liver Function Tests: No results for input(s): "AST", "ALT", "ALKPHOS", "BILITOT", "PROT", "ALBUMIN" in the last 168 hours. No results for input(s): "LIPASE", "AMYLASE" in the last 168 hours. No results for input(s): "AMMONIA" in the last 168 hours.  CBC: Recent Labs  Lab 11/08/22 0128 11/10/22 1517 11/10/22 1922 11/11/22 0128 11/12/22 0224  WBC 6.8 9.6 9.2 9.7 6.5  HGB 7.3* 9.2* 9.2* 8.4* 7.8*  HCT 22.6* 28.5* 29.1* 24.5* 24.1*  MCV 100.4* 101.1* 102.1* 97.2 98.4  PLT 125* 144* 162 166 119*    Cardiac Enzymes: No results for input(s): "CKTOTAL", "CKMB", "CKMBINDEX", "TROPONINI" in the last 168  hours.  BNP (last 3 results) No results for input(s): "BNP" in the last 8760 hours.  ProBNP (last 3 results) No results for input(s): "PROBNP" in the last 8760 hours.  Radiological Exams: ECHOCARDIOGRAM COMPLETE  Result Date: 11/10/2022    ECHOCARDIOGRAM REPORT   Patient Name:   [name] [name] Date of Exam: 11/10/2022 Medical Rec #:  [id number]            Height: Accession #:    [id number]           Weight: Date of Birth:  [birth year]            BSA: Patient Age:    71 years             BP:           134/60 mmHg Patient Gender: F                    HR:           52 bpm. Exam Location:  Inpatient Procedure: 2D Echo, Intracardiac Opacification Agent, Cardiac Doppler and Color            Doppler Indications:     Abnormal ECG R94.31  History:         Patient has no prior history of Echocardiogram examinations.  COPD; Risk Factors:Hypertension, Dyslipidemia and Diabetes.  Sonographer:     Aron Baba Referring Phys:  [id number] Florian Buff BROWN Diagnosing Phys: Orpah Cobb MD IMPRESSIONS  1. Left ventricular ejection fraction, by estimation, is 45 to 50%. The left ventricle has mildly decreased function. The left ventricle demonstrates global hypokinesis. Left ventricular diastolic parameters are indeterminate.  2. Right ventricular systolic function is normal. The right ventricular size is mildly enlarged. There is mildly elevated pulmonary artery systolic pressure.  3. Left atrial size was moderately dilated.  4. Right atrial size was moderately dilated.  5. The mitral valve is degenerative. Mild mitral valve regurgitation.  6. Tricuspid valve regurgitation is moderate to severe.  7. The aortic valve is tricuspid. There is severe calcifcation of the aortic valve. There is moderate thickening of the aortic valve. Aortic valve regurgitation is not visualized. Moderate to severe aortic valve stenosis.  8. There is mild (Grade II) atheroma plaque involving the ascending aorta.  9. The  inferior vena cava is dilated in size with <50% respiratory variability, suggesting right atrial pressure of 15 mmHg. FINDINGS  Left Ventricle: Left ventricular ejection fraction, by estimation, is 45 to 50%. The left ventricle has mildly decreased function. The left ventricle demonstrates global hypokinesis. The left ventricular internal cavity size was normal in size. There is  borderline concentric left ventricular hypertrophy. Left ventricular diastolic parameters are indeterminate. Right Ventricle: The right ventricular size is mildly enlarged. No increase in right ventricular wall thickness. Right ventricular systolic function is normal. There is mildly elevated pulmonary artery systolic pressure. The tricuspid regurgitant velocity is 2.97 m/s, and with an assumed right atrial pressure of 3 mmHg, the estimated right ventricular systolic pressure is 38.3 mmHg. Left Atrium: Left atrial size was moderately dilated. Right Atrium: Right atrial size was moderately dilated. Pericardium: There is no evidence of pericardial effusion. Mitral Valve: The mitral valve is degenerative in appearance. Mild mitral valve regurgitation. Tricuspid Valve: The tricuspid valve is normal in structure. Tricuspid valve regurgitation is moderate to severe. Aortic Valve: The aortic valve is tricuspid. There is severe calcifcation of the aortic valve. There is moderate thickening of the aortic valve. There is moderate aortic valve annular calcification. Aortic valve regurgitation is not visualized. Moderate to severe aortic stenosis is present. Aortic valve mean gradient measures 7.5 mmHg. Aortic valve peak gradient measures 15.4 mmHg. Aortic valve area, by VTI measures 0.65 cm. Pulmonic Valve: The pulmonic valve was normal in structure. Pulmonic valve regurgitation is not visualized. Aorta: The aortic root is normal in size and structure. There is mild (Grade II) atheroma plaque involving the ascending aorta. Venous: The inferior vena  cava is dilated in size with less than 50% respiratory variability, suggesting right atrial pressure of 15 mmHg. IAS/Shunts: The atrial septum is grossly normal.  LEFT VENTRICLE PLAX 2D LVIDd:         4.40 cm   Diastology LVIDs:         3.40 cm   LV e' medial:    6.85 cm/s LV PW:         1.00 cm   LV E/e' medial:  17.2 LV IVS:        1.10 cm   LV e' lateral:   6.64 cm/s LVOT diam:     1.60 cm   LV E/e' lateral: 17.8 LV SV:         29 LVOT Area:     2.01 cm  RIGHT VENTRICLE RV S prime:  7.72 cm/s TAPSE (M-mode): 0.9 cm LEFT ATRIUM              RIGHT ATRIUM LA diam:        4.50 cm  RA Area:     25.40 cm LA Vol (A2C):   103.0 ml RA Volume:   92.30 ml LA Vol (A4C):   65.9 ml LA Biplane Vol: 83.4 ml  AORTIC VALVE AV Area (Vmax):    0.70 cm AV Area (Vmean):   0.75 cm AV Area (VTI):     0.65 cm AV Vmax:           196.00 cm/s AV Vmean:          128.500 cm/s AV VTI:            0.449 m AV Peak Grad:      15.4 mmHg AV Mean Grad:      7.5 mmHg LVOT Vmax:         68.00 cm/s LVOT Vmean:        48.100 cm/s LVOT VTI:          0.146 m LVOT/AV VTI ratio: 0.33  AORTA Ao Root diam: 3.30 cm Ao Asc diam:  3.20 cm MITRAL VALVE                TRICUSPID VALVE MV Area (PHT): 2.49 cm     TR Peak grad:   35.3 mmHg MV Decel Time: 305 msec     TR Vmax:        297.00 cm/s MR Peak grad: 60.5 mmHg MR Vmax:      389.00 cm/s   SHUNTS MV E velocity: 118.00 cm/s  Systemic VTI:  0.15 m                             Systemic Diam: 1.60 cm Orpah Cobb MD Electronically signed by Orpah Cobb MD Signature Date/Time: 11/10/2022/2:14:02 PM    Final     Assessment/Plan Active Problems:   Acute on chronic respiratory failure with hypoxia (HCC)   Multifocal pneumonia   Acute metabolic encephalopathy   COPD, severe (HCC)   Obstructive sleep apnea   Acute on chronic respiratory failure hypoxia -we will continue with the BiPAP at nighttime while asleep continue with pulmonary toilet supportive care Multifocal pneumonia -has been treated we will  continue to monitor. Metabolic encephalopathy -no change Severe COPD-continue medical management Sleep apnea -supportive care continue with BiPAP   I have personally seen and evaluated the patient, evaluated laboratory and imaging results, formulated the assessment and plan and placed orders. The Patient requires high complexity decision making with multiple systems involvement.  Rounds were done with the Respiratory Therapy Director and Staff therapists and discussed with nursing staff also.  Yevonne Pax, MD [facility] Pulmonary Critical Care Medicine Sleep Medicine

## 2022-11-12 NOTE — Progress Notes (Cosign Needed)
Pulmonary Critical Care Medicine [facility] [place]   PULMONARY CRITICAL CARE SERVICE  PROGRESS NOTE     [name]  ZOX:[id number]  DOB: July 14, 1951   DOA: 09/27/2022  Referring Physician: Luna Kitchens, MD  HPI: [name] is a 71 y.o. female being followed for ventilator/airway/oxygen weaning Acute on Chronic Respiratory Failure.  Patient seen lying in bed, currently on half liter nasal cannula.  Wore BiPAP overnight and tolerated well.  Medications: Reviewed on Rounds  Physical Exam:  Vitals: Temp 97.1, pulse 42, respirations 21, BP 102/51, SPO2 100%  Ventilator Settings 0.5 L nasal cannula  General: Comfortable at this time Neck: supple Cardiovascular: no malignant arrhythmias Respiratory: Bilaterally diminished Skin: no rash seen on limited exam Musculoskeletal: No gross abnormality Psychiatric:unable to assess Neurologic:no involuntary movements         Lab Data:   Basic Metabolic Panel: Recent Labs  Lab 11/08/22 0128 11/10/22 1517 11/10/22 1922 11/11/22 0128 11/11/22 1024 11/12/22 0224  NA 133* 127* 124* 124*  --  127*  K 4.7 6.2* 6.2* 6.2* 4.9 4.9  CL 89* 84* 82* 81*  --  85*  CO2 34* 32 32 32  --  32  GLUCOSE 124* 126* 124* 116*  --  140*  BUN 38* 43* 46* 48*  --  42*  CREATININE 0.96 1.08* 1.21* 1.33*  --  1.11*  CALCIUM 8.5* 8.5* 8.3* 8.3*  --  8.1*  MG  --  2.0  --  2.1  --   --     ABG: No results for input(s): "PHART", "PCO2ART", "PO2ART", "HCO3", "O2SAT" in the last 168 hours.  Liver Function Tests: No results for input(s): "AST", "ALT", "ALKPHOS", "BILITOT", "PROT", "ALBUMIN" in the last 168 hours. No results for input(s): "LIPASE", "AMYLASE" in the last 168 hours. No results for input(s): "AMMONIA" in the last 168 hours.  CBC: Recent Labs  Lab 11/08/22 0128 11/10/22 1517 11/10/22 1922 11/11/22 0128 11/12/22 0224  WBC 6.8 9.6 9.2 9.7 6.5  HGB 7.3* 9.2* 9.2* 8.4* 7.8*  HCT 22.6* 28.5* 29.1*  24.5* 24.1*  MCV 100.4* 101.1* 102.1* 97.2 98.4  PLT 125* 144* 162 166 119*    Cardiac Enzymes: No results for input(s): "CKTOTAL", "CKMB", "CKMBINDEX", "TROPONINI" in the last 168 hours.  BNP (last 3 results) No results for input(s): "BNP" in the last 8760 hours.  ProBNP (last 3 results) No results for input(s): "PROBNP" in the last 8760 hours.  Radiological Exams: ECHOCARDIOGRAM COMPLETE  Result Date: 11/10/2022    ECHOCARDIOGRAM REPORT   Patient Name:   [name] Date of Exam: 11/10/2022 Medical Rec #:  [id number]            Height: Accession #:    [id number]           Weight: Date of Birth:  1951/12/19            BSA: Patient Age:    71 years             BP:           134/60 mmHg Patient Gender: F                    HR:           52 bpm. Exam Location:  Inpatient Procedure: 2D Echo, Intracardiac Opacification Agent, Cardiac Doppler and Color            Doppler Indications:     Abnormal ECG R94.31  History:         Patient has no prior history of Echocardiogram examinations.                  COPD; Risk Factors:Hypertension, Dyslipidemia and Diabetes.  Sonographer:     Aron Baba Referring Phys:  [id number] Florian Buff BROWN Diagnosing Phys: Orpah Cobb MD IMPRESSIONS  1. Left ventricular ejection fraction, by estimation, is 45 to 50%. The left ventricle has mildly decreased function. The left ventricle demonstrates global hypokinesis. Left ventricular diastolic parameters are indeterminate.  2. Right ventricular systolic function is normal. The right ventricular size is mildly enlarged. There is mildly elevated pulmonary artery systolic pressure.  3. Left atrial size was moderately dilated.  4. Right atrial size was moderately dilated.  5. The mitral valve is degenerative. Mild mitral valve regurgitation.  6. Tricuspid valve regurgitation is moderate to severe.  7. The aortic valve is tricuspid. There is severe calcifcation of the aortic valve. There is moderate thickening of the  aortic valve. Aortic valve regurgitation is not visualized. Moderate to severe aortic valve stenosis.  8. There is mild (Grade II) atheroma plaque involving the ascending aorta.  9. The inferior vena cava is dilated in size with <50% respiratory variability, suggesting right atrial pressure of 15 mmHg. FINDINGS  Left Ventricle: Left ventricular ejection fraction, by estimation, is 45 to 50%. The left ventricle has mildly decreased function. The left ventricle demonstrates global hypokinesis. The left ventricular internal cavity size was normal in size. There is  borderline concentric left ventricular hypertrophy. Left ventricular diastolic parameters are indeterminate. Right Ventricle: The right ventricular size is mildly enlarged. No increase in right ventricular wall thickness. Right ventricular systolic function is normal. There is mildly elevated pulmonary artery systolic pressure. The tricuspid regurgitant velocity is 2.97 m/s, and with an assumed right atrial pressure of 3 mmHg, the estimated right ventricular systolic pressure is 38.3 mmHg. Left Atrium: Left atrial size was moderately dilated. Right Atrium: Right atrial size was moderately dilated. Pericardium: There is no evidence of pericardial effusion. Mitral Valve: The mitral valve is degenerative in appearance. Mild mitral valve regurgitation. Tricuspid Valve: The tricuspid valve is normal in structure. Tricuspid valve regurgitation is moderate to severe. Aortic Valve: The aortic valve is tricuspid. There is severe calcifcation of the aortic valve. There is moderate thickening of the aortic valve. There is moderate aortic valve annular calcification. Aortic valve regurgitation is not visualized. Moderate to severe aortic stenosis is present. Aortic valve mean gradient measures 7.5 mmHg. Aortic valve peak gradient measures 15.4 mmHg. Aortic valve area, by VTI measures 0.65 cm. Pulmonic Valve: The pulmonic valve was normal in structure. Pulmonic valve  regurgitation is not visualized. Aorta: The aortic root is normal in size and structure. There is mild (Grade II) atheroma plaque involving the ascending aorta. Venous: The inferior vena cava is dilated in size with less than 50% respiratory variability, suggesting right atrial pressure of 15 mmHg. IAS/Shunts: The atrial septum is grossly normal.  LEFT VENTRICLE PLAX 2D LVIDd:         4.40 cm   Diastology LVIDs:         3.40 cm   LV e' medial:    6.85 cm/s LV PW:         1.00 cm   LV E/e' medial:  17.2 LV IVS:        1.10 cm   LV e' lateral:   6.64 cm/s LVOT diam:     1.60 cm  LV E/e' lateral: 17.8 LV SV:         29 LVOT Area:     2.01 cm  RIGHT VENTRICLE RV S prime:     7.72 cm/s TAPSE (M-mode): 0.9 cm LEFT ATRIUM              RIGHT ATRIUM LA diam:        4.50 cm  RA Area:     25.40 cm LA Vol (A2C):   103.0 ml RA Volume:   92.30 ml LA Vol (A4C):   65.9 ml LA Biplane Vol: 83.4 ml  AORTIC VALVE AV Area (Vmax):    0.70 cm AV Area (Vmean):   0.75 cm AV Area (VTI):     0.65 cm AV Vmax:           196.00 cm/s AV Vmean:          128.500 cm/s AV VTI:            0.449 m AV Peak Grad:      15.4 mmHg AV Mean Grad:      7.5 mmHg LVOT Vmax:         68.00 cm/s LVOT Vmean:        48.100 cm/s LVOT VTI:          0.146 m LVOT/AV VTI ratio: 0.33  AORTA Ao Root diam: 3.30 cm Ao Asc diam:  3.20 cm MITRAL VALVE                TRICUSPID VALVE MV Area (PHT): 2.49 cm     TR Peak grad:   35.3 mmHg MV Decel Time: 305 msec     TR Vmax:        297.00 cm/s MR Peak grad: 60.5 mmHg MR Vmax:      389.00 cm/s   SHUNTS MV E velocity: 118.00 cm/s  Systemic VTI:  0.15 m                             Systemic Diam: 1.60 cm Orpah Cobb MD Electronically signed by Orpah Cobb MD Signature Date/Time: 11/10/2022/2:14:02 PM    Final     Assessment/Plan Active Problems:   Acute on chronic respiratory failure with hypoxia (HCC)   Multifocal pneumonia   Acute metabolic encephalopathy   COPD, severe (HCC)   Obstructive sleep apnea   Acute on  chronic respiratory failure hypoxia -we will continue with the BiPAP at nighttime while asleep continue with pulmonary toilet supportive care Multifocal pneumonia -has been treated we will continue to monitor. Metabolic encephalopathy -no change Severe COPD-continue medical management Sleep apnea -supportive care continue with BiPAP   I have personally seen and evaluated the patient, evaluated laboratory and imaging results, formulated the assessment and plan and placed orders. The Patient requires high complexity decision making with multiple systems involvement.  Rounds were done with the Respiratory Therapy Director and Staff therapists and discussed with nursing staff also.  Yevonne Pax, MD [facility] Pulmonary Critical Care Medicine Sleep Medicine

## 2022-11-13 DIAGNOSIS — J449 Chronic obstructive pulmonary disease, unspecified: Secondary | ICD-10-CM | POA: Diagnosis not present

## 2022-11-13 DIAGNOSIS — G9341 Metabolic encephalopathy: Secondary | ICD-10-CM | POA: Diagnosis not present

## 2022-11-13 DIAGNOSIS — J9621 Acute and chronic respiratory failure with hypoxia: Secondary | ICD-10-CM | POA: Diagnosis not present

## 2022-11-13 DIAGNOSIS — J189 Pneumonia, unspecified organism: Secondary | ICD-10-CM | POA: Diagnosis not present

## 2022-11-13 LAB — OCCULT BLOOD X 1 CARD TO LAB, STOOL: Fecal Occult Bld: NEGATIVE

## 2022-11-14 DIAGNOSIS — J9621 Acute and chronic respiratory failure with hypoxia: Secondary | ICD-10-CM | POA: Diagnosis not present

## 2022-11-14 DIAGNOSIS — J449 Chronic obstructive pulmonary disease, unspecified: Secondary | ICD-10-CM | POA: Diagnosis not present

## 2022-11-14 DIAGNOSIS — G9341 Metabolic encephalopathy: Secondary | ICD-10-CM | POA: Diagnosis not present

## 2022-11-14 DIAGNOSIS — J189 Pneumonia, unspecified organism: Secondary | ICD-10-CM | POA: Diagnosis not present

## 2022-11-14 LAB — BASIC METABOLIC PANEL
Anion gap: 7 (ref 5–15)
BUN: 38 mg/dL — ABNORMAL HIGH (ref 8–23)
CO2: 34 mmol/L — ABNORMAL HIGH (ref 22–32)
Calcium: 8.1 mg/dL — ABNORMAL LOW (ref 8.9–10.3)
Chloride: 85 mmol/L — ABNORMAL LOW (ref 98–111)
Creatinine, Ser: 1.1 mg/dL — ABNORMAL HIGH (ref 0.44–1.00)
GFR, Estimated: 54 mL/min — ABNORMAL LOW (ref >60)
Glucose, Bld: 150 mg/dL — ABNORMAL HIGH (ref 70–99)
Potassium: 5.2 mmol/L — ABNORMAL HIGH (ref 3.5–5.1)
Sodium: 126 mmol/L — ABNORMAL LOW (ref 135–145)

## 2022-11-14 LAB — MAGNESIUM: Magnesium: 1.9 mg/dL (ref 1.7–2.4)

## 2022-11-14 LAB — CBC
HCT: 24.2 % — ABNORMAL LOW (ref 36.0–46.0)
Hemoglobin: 7.9 g/dL — ABNORMAL LOW (ref 12.0–15.0)
MCH: 32.5 pg (ref 26.0–34.0)
MCHC: 32.6 g/dL (ref 30.0–36.0)
MCV: 99.6 fL (ref 80.0–100.0)
Platelets: 118 10*3/uL — ABNORMAL LOW (ref 150–400)
RBC: 2.43 MIL/uL — ABNORMAL LOW (ref 3.87–5.11)
RDW: 15 % (ref 11.5–15.5)
WBC: 7.2 10*3/uL (ref 4.0–10.5)
nRBC: 0 % (ref 0.0–0.2)

## 2022-11-15 ENCOUNTER — Other Ambulatory Visit (HOSPITAL_COMMUNITY): Payer: Medicare Other

## 2022-11-15 DIAGNOSIS — J449 Chronic obstructive pulmonary disease, unspecified: Secondary | ICD-10-CM | POA: Diagnosis not present

## 2022-11-15 DIAGNOSIS — J9621 Acute and chronic respiratory failure with hypoxia: Secondary | ICD-10-CM | POA: Diagnosis not present

## 2022-11-15 DIAGNOSIS — G9341 Metabolic encephalopathy: Secondary | ICD-10-CM | POA: Diagnosis not present

## 2022-11-15 DIAGNOSIS — J189 Pneumonia, unspecified organism: Secondary | ICD-10-CM | POA: Diagnosis not present

## 2022-11-15 LAB — BASIC METABOLIC PANEL
Anion gap: 9 (ref 5–15)
BUN: 43 mg/dL — ABNORMAL HIGH (ref 8–23)
CO2: 30 mmol/L (ref 22–32)
Calcium: 8.3 mg/dL — ABNORMAL LOW (ref 8.9–10.3)
Chloride: 86 mmol/L — ABNORMAL LOW (ref 98–111)
Creatinine, Ser: 1.19 mg/dL — ABNORMAL HIGH (ref 0.44–1.00)
GFR, Estimated: 49 mL/min — ABNORMAL LOW (ref >60)
Glucose, Bld: 108 mg/dL — ABNORMAL HIGH (ref 70–99)
Potassium: 5.7 mmol/L — ABNORMAL HIGH (ref 3.5–5.1)
Sodium: 125 mmol/L — ABNORMAL LOW (ref 135–145)

## 2022-11-15 LAB — CBC
HCT: 25.5 % — ABNORMAL LOW (ref 36.0–46.0)
Hemoglobin: 8.2 g/dL — ABNORMAL LOW (ref 12.0–15.0)
MCH: 32.3 pg (ref 26.0–34.0)
MCHC: 32.2 g/dL (ref 30.0–36.0)
MCV: 100.4 fL — ABNORMAL HIGH (ref 80.0–100.0)
Platelets: 142 10*3/uL — ABNORMAL LOW (ref 150–400)
RBC: 2.54 MIL/uL — ABNORMAL LOW (ref 3.87–5.11)
RDW: 15.4 % (ref 11.5–15.5)
WBC: 9.2 10*3/uL (ref 4.0–10.5)
nRBC: 0 % (ref 0.0–0.2)

## 2022-11-15 LAB — POTASSIUM
Potassium: 5.5 mmol/L — ABNORMAL HIGH (ref 3.5–5.1)
Potassium: 5.5 mmol/L — ABNORMAL HIGH (ref 3.5–5.1)

## 2022-11-15 NOTE — Progress Notes (Addendum)
Pulmonary Critical Care Medicine [facility] [place]   PULMONARY CRITICAL CARE SERVICE  PROGRESS NOTE     [name]  [id number]:[id number]  DOB: 09/29/1951   DOA: 09/27/2022  Referring Physician: Luna Kitchens, MD  HPI: [name] is a 71 y.o. female being followed for ventilator/airway/oxygen weaning Acute on Chronic Respiratory Failure.  Patient seen lying in bed, remains on half liter nasal cannula.  Doing well with BiPAP overnight.  Medications: Reviewed on Rounds  Physical Exam:  Vitals: Temp 97.3, pulse 75, respirations 20, BP 116/48, SPO2 100%  Ventilator Settings 0.5 L nasal cannula  General: Comfortable at this time Neck: supple Cardiovascular: no malignant arrhythmias Respiratory: Bilaterally diminished Skin: no rash seen on limited exam Musculoskeletal: No gross abnormality Psychiatric:unable to assess Neurologic:no involuntary movements         Lab Data:   Basic Metabolic Panel: Recent Labs  Lab 11/10/22 1517 11/10/22 1922 11/11/22 0128 11/11/22 1024 11/12/22 0224 11/14/22 0128 11/15/22 0138  NA 127* 124* 124*  --  127* 126* 125*  K 6.2* 6.2* 6.2* 4.9 4.9 5.2* 5.7*  CL 84* 82* 81*  --  85* 85* 86*  CO2 32 32 32  --  32 34* 30  GLUCOSE 126* 124* 116*  --  140* 150* 108*  BUN 43* 46* 48*  --  42* 38* 43*  CREATININE 1.08* 1.21* 1.33*  --  1.11* 1.10* 1.19*  CALCIUM 8.5* 8.3* 8.3*  --  8.1* 8.1* 8.3*  MG 2.0  --  2.1  --   --  1.9  --     ABG: No results for input(s): "PHART", "PCO2ART", "PO2ART", "HCO3", "O2SAT" in the last 168 hours.  Liver Function Tests: No results for input(s): "AST", "ALT", "ALKPHOS", "BILITOT", "PROT", "ALBUMIN" in the last 168 hours. No results for input(s): "LIPASE", "AMYLASE" in the last 168 hours. No results for input(s): "AMMONIA" in the last 168 hours.  CBC: Recent Labs  Lab 11/10/22 1922 11/11/22 0128 11/12/22 0224 11/14/22 0128 11/15/22 0138  WBC 9.2 9.7 6.5 7.2 9.2  HGB 9.2*  8.4* 7.8* 7.9* 8.2*  HCT 29.1* 24.5* 24.1* 24.2* 25.5*  MCV 102.1* 97.2 98.4 99.6 100.4*  PLT 162 166 119* 118* 142*    Cardiac Enzymes: No results for input(s): "CKTOTAL", "CKMB", "CKMBINDEX", "TROPONINI" in the last 168 hours.  BNP (last 3 results) No results for input(s): "BNP" in the last 8760 hours.  ProBNP (last 3 results) No results for input(s): "PROBNP" in the last 8760 hours.  Radiological Exams: No results found.  Assessment/Plan Active Problems:   Acute on chronic respiratory failure with hypoxia (HCC)   Multifocal pneumonia   Acute metabolic encephalopathy   COPD, severe (HCC)   Obstructive sleep apnea   Acute on chronic respiratory failure hypoxia -we will continue with the BiPAP at nighttime while asleep continue with pulmonary toilet supportive care Multifocal pneumonia -has been treated we will continue to monitor. Metabolic encephalopathy -no change Severe COPD-continue medical management Sleep apnea -supportive care continue with BiPAP   I have personally seen and evaluated the patient, evaluated laboratory and imaging results, formulated the assessment and plan and placed orders. The Patient requires high complexity decision making with multiple systems involvement.  Rounds were done with the Respiratory Therapy Director and Staff therapists and discussed with nursing staff also.  Yevonne Pax, MD [facility] Pulmonary Critical Care Medicine Sleep Medicine

## 2022-11-15 NOTE — Progress Notes (Addendum)
Pulmonary Critical Care Medicine [facility] [place]   PULMONARY CRITICAL CARE SERVICE  PROGRESS NOTE     [name]  WUJ:[id number]  DOB: 1951-02-27   DOA: 09/27/2022  Referring Physician: Luna Kitchens, MD  HPI: [name] is a 71 y.o. female being followed for ventilator/airway/oxygen weaning Acute on Chronic Respiratory Failure.  Patient seen sitting up in bed, currently remains on 0.5 L nasal cannula, continues to do well with BiPAP at night.  Medications: Reviewed on Rounds  Physical Exam:  Vitals: Temp 97.3, pulse 61, respirations 18, BP 138/55, SPO2 99%  Ventilator Settings 0.5 L nasal cannula  General: Comfortable at this time Neck: supple Cardiovascular: no malignant arrhythmias Respiratory: Bilaterally diminished Skin: no rash seen on limited exam Musculoskeletal: No gross abnormality Psychiatric:unable to assess Neurologic:no involuntary movements         Lab Data:   Basic Metabolic Panel: Recent Labs  Lab 11/10/22 1517 11/10/22 1922 11/11/22 0128 11/11/22 1024 11/12/22 0224 11/14/22 0128 11/15/22 0138  NA 127* 124* 124*  --  127* 126* 125*  K 6.2* 6.2* 6.2* 4.9 4.9 5.2* 5.7*  CL 84* 82* 81*  --  85* 85* 86*  CO2 32 32 32  --  32 34* 30  GLUCOSE 126* 124* 116*  --  140* 150* 108*  BUN 43* 46* 48*  --  42* 38* 43*  CREATININE 1.08* 1.21* 1.33*  --  1.11* 1.10* 1.19*  CALCIUM 8.5* 8.3* 8.3*  --  8.1* 8.1* 8.3*  MG 2.0  --  2.1  --   --  1.9  --     ABG: No results for input(s): "PHART", "PCO2ART", "PO2ART", "HCO3", "O2SAT" in the last 168 hours.  Liver Function Tests: No results for input(s): "AST", "ALT", "ALKPHOS", "BILITOT", "PROT", "ALBUMIN" in the last 168 hours. No results for input(s): "LIPASE", "AMYLASE" in the last 168 hours. No results for input(s): "AMMONIA" in the last 168 hours.  CBC: Recent Labs  Lab 11/10/22 1922 11/11/22 0128 11/12/22 0224 11/14/22 0128 11/15/22 0138  WBC 9.2 9.7 6.5  7.2 9.2  HGB 9.2* 8.4* 7.8* 7.9* 8.2*  HCT 29.1* 24.5* 24.1* 24.2* 25.5*  MCV 102.1* 97.2 98.4 99.6 100.4*  PLT 162 166 119* 118* 142*    Cardiac Enzymes: No results for input(s): "CKTOTAL", "CKMB", "CKMBINDEX", "TROPONINI" in the last 168 hours.  BNP (last 3 results) No results for input(s): "BNP" in the last 8760 hours.  ProBNP (last 3 results) No results for input(s): "PROBNP" in the last 8760 hours.  Radiological Exams: No results found.  Assessment/Plan Active Problems:   Acute on chronic respiratory failure with hypoxia (HCC)   Multifocal pneumonia   Acute metabolic encephalopathy   COPD, severe (HCC)   Obstructive sleep apnea   Acute on chronic respiratory failure hypoxia -we will continue with the BiPAP at nighttime while asleep continue with pulmonary toilet supportive care Multifocal pneumonia -has been treated we will continue to monitor. Metabolic encephalopathy -no change Severe COPD-continue medical management Sleep apnea -supportive care continue with BiPAP   I have personally seen and evaluated the patient, evaluated laboratory and imaging results, formulated the assessment and plan and placed orders. The Patient requires high complexity decision making with multiple systems involvement.  Rounds were done with the Respiratory Therapy Director and Staff therapists and discussed with nursing staff also.  Yevonne Pax, MD [facility] Pulmonary Critical Care Medicine Sleep Medicine

## 2022-11-15 NOTE — Progress Notes (Addendum)
Pulmonary Critical Care Medicine [facility] [place]   PULMONARY CRITICAL CARE SERVICE  PROGRESS NOTE     [name]  YIR:[id number]  DOB: Nov 29, 1951   DOA: 09/27/2022  Referring Physician: Luna Kitchens, MD  HPI: [name] is a 71 y.o. female being followed for ventilator/airway/oxygen weaning Acute on Chronic Respiratory Failure.  Patient seen lying in bed, currently remains on half liter nasal cannula.  Continues to do well with BiPAP at night.  Medications: Reviewed on Rounds  Physical Exam:  Vitals: Temp 97.3, pulse 63, respirations 32, BP 120/60, SPO2 100%  Ventilator Settings half liter nasal cannula  General: Comfortable at this time Neck: supple Cardiovascular: no malignant arrhythmias Respiratory: Bilaterally diminished Skin: no rash seen on limited exam Musculoskeletal: No gross abnormality Psychiatric:unable to assess Neurologic:no involuntary movements         Lab Data:   Basic Metabolic Panel: Recent Labs  Lab 11/10/22 1517 11/10/22 1922 11/11/22 0128 11/11/22 1024 11/12/22 0224 11/14/22 0128 11/15/22 0138  NA 127* 124* 124*  --  127* 126* 125*  K 6.2* 6.2* 6.2* 4.9 4.9 5.2* 5.7*  CL 84* 82* 81*  --  85* 85* 86*  CO2 32 32 32  --  32 34* 30  GLUCOSE 126* 124* 116*  --  140* 150* 108*  BUN 43* 46* 48*  --  42* 38* 43*  CREATININE 1.08* 1.21* 1.33*  --  1.11* 1.10* 1.19*  CALCIUM 8.5* 8.3* 8.3*  --  8.1* 8.1* 8.3*  MG 2.0  --  2.1  --   --  1.9  --     ABG: No results for input(s): "PHART", "PCO2ART", "PO2ART", "HCO3", "O2SAT" in the last 168 hours.  Liver Function Tests: No results for input(s): "AST", "ALT", "ALKPHOS", "BILITOT", "PROT", "ALBUMIN" in the last 168 hours. No results for input(s): "LIPASE", "AMYLASE" in the last 168 hours. No results for input(s): "AMMONIA" in the last 168 hours.  CBC: Recent Labs  Lab 11/10/22 1922 11/11/22 0128 11/12/22 0224 11/14/22 0128 11/15/22 0138  WBC 9.2  9.7 6.5 7.2 9.2  HGB 9.2* 8.4* 7.8* 7.9* 8.2*  HCT 29.1* 24.5* 24.1* 24.2* 25.5*  MCV 102.1* 97.2 98.4 99.6 100.4*  PLT 162 166 119* 118* 142*    Cardiac Enzymes: No results for input(s): "CKTOTAL", "CKMB", "CKMBINDEX", "TROPONINI" in the last 168 hours.  BNP (last 3 results) No results for input(s): "BNP" in the last 8760 hours.  ProBNP (last 3 results) No results for input(s): "PROBNP" in the last 8760 hours.  Radiological Exams: No results found.  Assessment/Plan Active Problems:   Acute on chronic respiratory failure with hypoxia (HCC)   Multifocal pneumonia   Acute metabolic encephalopathy   COPD, severe (HCC)   Obstructive sleep apnea   Acute on chronic respiratory failure hypoxia -we will continue with the BiPAP at nighttime while asleep continue with pulmonary toilet supportive care Multifocal pneumonia -has been treated we will continue to monitor. Metabolic encephalopathy -no change Severe COPD-continue medical management Sleep apnea -supportive care continue with BiPAP   I have personally seen and evaluated the patient, evaluated laboratory and imaging results, formulated the assessment and plan and placed orders. The Patient requires high complexity decision making with multiple systems involvement.  Rounds were done with the Respiratory Therapy Director and Staff therapists and discussed with nursing staff also.  Yevonne Pax, MD [facility] Pulmonary Critical Care Medicine Sleep Medicine

## 2022-11-16 DIAGNOSIS — J449 Chronic obstructive pulmonary disease, unspecified: Secondary | ICD-10-CM | POA: Diagnosis not present

## 2022-11-16 DIAGNOSIS — G9341 Metabolic encephalopathy: Secondary | ICD-10-CM | POA: Diagnosis not present

## 2022-11-16 DIAGNOSIS — J189 Pneumonia, unspecified organism: Secondary | ICD-10-CM | POA: Diagnosis not present

## 2022-11-16 DIAGNOSIS — J9621 Acute and chronic respiratory failure with hypoxia: Secondary | ICD-10-CM | POA: Diagnosis not present

## 2022-11-16 LAB — CBC
HCT: 24 % — ABNORMAL LOW (ref 36.0–46.0)
Hemoglobin: 7.5 g/dL — ABNORMAL LOW (ref 12.0–15.0)
MCH: 32.3 pg (ref 26.0–34.0)
MCHC: 31.3 g/dL (ref 30.0–36.0)
MCV: 103.4 fL — ABNORMAL HIGH (ref 80.0–100.0)
Platelets: 116 10*3/uL — ABNORMAL LOW (ref 150–400)
RBC: 2.32 MIL/uL — ABNORMAL LOW (ref 3.87–5.11)
RDW: 15.2 % (ref 11.5–15.5)
WBC: 6.4 10*3/uL (ref 4.0–10.5)
nRBC: 0 % (ref 0.0–0.2)

## 2022-11-16 LAB — MAGNESIUM: Magnesium: 2.1 mg/dL (ref 1.7–2.4)

## 2022-11-16 LAB — BASIC METABOLIC PANEL
Anion gap: 8 (ref 5–15)
BUN: 39 mg/dL — ABNORMAL HIGH (ref 8–23)
CO2: 35 mmol/L — ABNORMAL HIGH (ref 22–32)
Calcium: 8.2 mg/dL — ABNORMAL LOW (ref 8.9–10.3)
Chloride: 85 mmol/L — ABNORMAL LOW (ref 98–111)
Creatinine, Ser: 0.86 mg/dL (ref 0.44–1.00)
GFR, Estimated: >60 mL/min (ref >60)
Glucose, Bld: 116 mg/dL — ABNORMAL HIGH (ref 70–99)
Potassium: 4.8 mmol/L (ref 3.5–5.1)
Sodium: 128 mmol/L — ABNORMAL LOW (ref 135–145)

## 2022-11-16 NOTE — Progress Notes (Addendum)
Pulmonary Critical Care Medicine [facility] [place]   PULMONARY CRITICAL CARE SERVICE  PROGRESS NOTE     Korina Macias Bridges  ZOX:[id number]  DOB: 09/28/1951   DOA: 09/27/2022  Referring Physician: Luna Kitchens, MD  HPI: [name] is a 71 y.o. female being followed for ventilator/airway/oxygen weaning Acute on Chronic Respiratory Failure.  Patient seen sitting in bed, currently remains on half liter nasal cannula.  Continues to use BiPAP without issue during noc hours.  Medications: Reviewed on Rounds  Physical Exam:  Vitals: Temp 97.3, pulse 61, respirations 28, BP 127/61, SPO2 100%  Ventilator Settings 0.5 L nasal cannula  General: Comfortable at this time Neck: supple Cardiovascular: no malignant arrhythmias Respiratory: Bilaterally clear Skin: no rash seen on limited exam Musculoskeletal: No gross abnormality Psychiatric:unable to assess Neurologic:no involuntary movements         Lab Data:   Basic Metabolic Panel: Recent Labs  Lab 11/10/22 1517 11/10/22 1922 11/11/22 0128 11/11/22 1024 11/12/22 0224 11/14/22 0128 11/15/22 0138 11/15/22 1517 11/15/22 1758 11/16/22 0125  NA 127*   < > 124*  --  127* 126* 125*  --   --  128*  K 6.2*   < > 6.2*   < > 4.9 5.2* 5.7* 5.5* 5.5* 4.8  CL 84*   < > 81*  --  85* 85* 86*  --   --  85*  CO2 32   < > 32  --  32 34* 30  --   --  35*  GLUCOSE 126*   < > 116*  --  140* 150* 108*  --   --  116*  BUN 43*   < > 48*  --  42* 38* 43*  --   --  39*  CREATININE 1.08*   < > 1.33*  --  1.11* 1.10* 1.19*  --   --  0.86  CALCIUM 8.5*   < > 8.3*  --  8.1* 8.1* 8.3*  --   --  8.2*  MG 2.0  --  2.1  --   --  1.9  --   --   --  2.1   < > = values in this interval not displayed.    ABG: No results for input(s): "PHART", "PCO2ART", "PO2ART", "HCO3", "O2SAT" in the last 168 hours.  Liver Function Tests: No results for input(s): "AST", "ALT", "ALKPHOS", "BILITOT", "PROT", "ALBUMIN" in the last 168  hours. No results for input(s): "LIPASE", "AMYLASE" in the last 168 hours. No results for input(s): "AMMONIA" in the last 168 hours.  CBC: Recent Labs  Lab 11/11/22 0128 11/12/22 0224 11/14/22 0128 11/15/22 0138 11/16/22 0125  WBC 9.7 6.5 7.2 9.2 6.4  HGB 8.4* 7.8* 7.9* 8.2* 7.5*  HCT 24.5* 24.1* 24.2* 25.5* 24.0*  MCV 97.2 98.4 99.6 100.4* 103.4*  PLT 166 119* 118* 142* 116*    Cardiac Enzymes: No results for input(s): "CKTOTAL", "CKMB", "CKMBINDEX", "TROPONINI" in the last 168 hours.  BNP (last 3 results) No results for input(s): "BNP" in the last 8760 hours.  ProBNP (last 3 results) No results for input(s): "PROBNP" in the last 8760 hours.  Radiological Exams: DG CHEST PORT 1 VIEW  Result Date: 11/15/2022 CLINICAL DATA:  142230 Pleural effusion [id number] EXAM: PORTABLE CHEST 1 VIEW COMPARISON:  Chest x-ray 11/09/2022, CT chest 03/15/2017 FINDINGS: The heart and mediastinal contours are unchanged. Persistent left lingular linear atelectasis versus scarring. Atherosclerotic plaque. No focal consolidation. No pulmonary edema. Persistent trace left pleural effusion. Question trace  right pleural effusion with costophrenic angle collimated off view on the right. No pneumothorax. No acute osseous abnormality. IMPRESSION: 1. Persistent left lingular linear atelectasis versus scarring. Underlying infection/inflammation is not fully excluded. 2. Persistent trace left pleural effusion. Question trace right pleural effusion with costophrenic angle collimated off view on the right. 3.  Aortic Atherosclerosis (ICD10-I70.0). Electronically Signed   By: Tish Frederickson M.D.   On: 11/15/2022 16:43   DG Abd 1 View  Result Date: 11/15/2022 CLINICAL DATA:  29528 Abdominal distension [id number] EXAM: ABDOMEN - 1 VIEW COMPARISON:  None Available. FINDINGS: Limited evaluation due to overlapping osseous structures and overlying soft tissues. Skin staples overlie the right mid abdomen. Surgical staples  overlie the pelvis. The bowel gas pattern is normal. No radio-opaque calculi or other significant radiographic abnormality are seen. IMPRESSION: Nonobstructive bowel gas pattern. Electronically Signed   By: Tish Frederickson M.D.   On: 11/15/2022 16:42    Assessment/Plan Active Problems:   Acute on chronic respiratory failure with hypoxia (HCC)   Multifocal pneumonia   Acute metabolic encephalopathy   COPD, severe (HCC)   Obstructive sleep apnea   Acute on chronic respiratory failure hypoxia -we will continue with the BiPAP at nighttime while asleep continue with pulmonary toilet supportive care Multifocal pneumonia -has been treated we will continue to monitor. Metabolic encephalopathy -no change Severe COPD-continue medical management Sleep apnea -supportive care continue with BiPAP   I have personally seen and evaluated the patient, evaluated laboratory and imaging results, formulated the assessment and plan and placed orders. The Patient requires high complexity decision making with multiple systems involvement.  Rounds were done with the Respiratory Therapy Director and Staff therapists and discussed with nursing staff also.  Yevonne Pax, MD [facility] Pulmonary Critical Care Medicine Sleep Medicine

## 2022-11-17 DIAGNOSIS — J9621 Acute and chronic respiratory failure with hypoxia: Secondary | ICD-10-CM | POA: Diagnosis not present

## 2022-11-17 DIAGNOSIS — J449 Chronic obstructive pulmonary disease, unspecified: Secondary | ICD-10-CM | POA: Diagnosis not present

## 2022-11-17 DIAGNOSIS — J189 Pneumonia, unspecified organism: Secondary | ICD-10-CM | POA: Diagnosis not present

## 2022-11-17 DIAGNOSIS — G9341 Metabolic encephalopathy: Secondary | ICD-10-CM | POA: Diagnosis not present

## 2022-11-17 LAB — CBC
HCT: 23.5 % — ABNORMAL LOW (ref 36.0–46.0)
Hemoglobin: 7.6 g/dL — ABNORMAL LOW (ref 12.0–15.0)
MCH: 32.5 pg (ref 26.0–34.0)
MCHC: 32.3 g/dL (ref 30.0–36.0)
MCV: 100.4 fL — ABNORMAL HIGH (ref 80.0–100.0)
Platelets: 113 10*3/uL — ABNORMAL LOW (ref 150–400)
RBC: 2.34 MIL/uL — ABNORMAL LOW (ref 3.87–5.11)
RDW: 15.1 % (ref 11.5–15.5)
WBC: 6.9 10*3/uL (ref 4.0–10.5)
nRBC: 0 % (ref 0.0–0.2)

## 2022-11-17 LAB — BASIC METABOLIC PANEL
Anion gap: 8 (ref 5–15)
BUN: 37 mg/dL — ABNORMAL HIGH (ref 8–23)
CO2: 32 mmol/L (ref 22–32)
Calcium: 8.2 mg/dL — ABNORMAL LOW (ref 8.9–10.3)
Chloride: 89 mmol/L — ABNORMAL LOW (ref 98–111)
Creatinine, Ser: 0.74 mg/dL (ref 0.44–1.00)
GFR, Estimated: >60 mL/min (ref >60)
Glucose, Bld: 102 mg/dL — ABNORMAL HIGH (ref 70–99)
Potassium: 5.1 mmol/L (ref 3.5–5.1)
Sodium: 129 mmol/L — ABNORMAL LOW (ref 135–145)

## 2022-11-18 ENCOUNTER — Other Ambulatory Visit (HOSPITAL_COMMUNITY): Payer: Medicare Other

## 2022-11-18 DIAGNOSIS — G9341 Metabolic encephalopathy: Secondary | ICD-10-CM | POA: Diagnosis not present

## 2022-11-18 DIAGNOSIS — J9621 Acute and chronic respiratory failure with hypoxia: Secondary | ICD-10-CM | POA: Diagnosis not present

## 2022-11-18 DIAGNOSIS — J189 Pneumonia, unspecified organism: Secondary | ICD-10-CM | POA: Diagnosis not present

## 2022-11-18 DIAGNOSIS — J449 Chronic obstructive pulmonary disease, unspecified: Secondary | ICD-10-CM | POA: Diagnosis not present

## 2022-11-18 NOTE — Progress Notes (Addendum)
Pulmonary Critical Care Medicine [facility] [place]   PULMONARY CRITICAL CARE SERVICE  PROGRESS NOTE     [name]  HWE:[id number]  DOB: June 08, 1951   DOA: 09/27/2022  Referring Physician: Luna Kitchens, MD  HPI: [name] is a 71 y.o. female being followed for ventilator/airway/oxygen weaning Acute on Chronic Respiratory Failure. Patient seen lying in bed, currently remains on half liter nasal cannula during the day and BiPAP use at night.  Medications: Reviewed on Rounds  Physical Exam:  Vitals: Temp 96.6, pulse 54, respirations 18, BP 121/53, SpO2 99%  Ventilator Settings 0.5 L nasal cannula  General: Comfortable at this time Neck: supple Cardiovascular: no malignant arrhythmias Respiratory: Bilaterally diminished Skin: no rash seen on limited exam Musculoskeletal: No gross abnormality Psychiatric:unable to assess Neurologic:no involuntary movements         Lab Data:   Basic Metabolic Panel: Recent Labs  Lab 11/12/22 0224 11/14/22 0128 11/15/22 0138 11/15/22 1517 11/15/22 1758 11/16/22 0125 11/17/22 0301  NA 127* 126* 125*  --   --  128* 129*  K 4.9 5.2* 5.7* 5.5* 5.5* 4.8 5.1  CL 85* 85* 86*  --   --  85* 89*  CO2 32 34* 30  --   --  35* 32  GLUCOSE 140* 150* 108*  --   --  116* 102*  BUN 42* 38* 43*  --   --  39* 37*  CREATININE 1.11* 1.10* 1.19*  --   --  0.86 0.74  CALCIUM 8.1* 8.1* 8.3*  --   --  8.2* 8.2*  MG  --  1.9  --   --   --  2.1  --     ABG: No results for input(s): "PHART", "PCO2ART", "PO2ART", "HCO3", "O2SAT" in the last 168 hours.  Liver Function Tests: No results for input(s): "AST", "ALT", "ALKPHOS", "BILITOT", "PROT", "ALBUMIN" in the last 168 hours. No results for input(s): "LIPASE", "AMYLASE" in the last 168 hours. No results for input(s): "AMMONIA" in the last 168 hours.  CBC: Recent Labs  Lab 11/12/22 0224 11/14/22 0128 11/15/22 0138 11/16/22 0125 11/17/22 0301  WBC 6.5 7.2 9.2 6.4  6.9  HGB 7.8* 7.9* 8.2* 7.5* 7.6*  HCT 24.1* 24.2* 25.5* 24.0* 23.5*  MCV 98.4 99.6 100.4* 103.4* 100.4*  PLT 119* 118* 142* 116* 113*    Cardiac Enzymes: No results for input(s): "CKTOTAL", "CKMB", "CKMBINDEX", "TROPONINI" in the last 168 hours.  BNP (last 3 results) No results for input(s): "BNP" in the last 8760 hours.  ProBNP (last 3 results) No results for input(s): "PROBNP" in the last 8760 hours.  Radiological Exams: No results found.  Assessment/Plan Active Problems:   Acute on chronic respiratory failure with hypoxia (HCC)   Multifocal pneumonia   Acute metabolic encephalopathy   COPD, severe (HCC)   Obstructive sleep apnea   Acute on chronic respiratory failure hypoxia -we will continue with the BiPAP at nighttime while asleep continue with pulmonary toilet supportive care Multifocal pneumonia -has been treated we will continue to monitor. Metabolic encephalopathy -no change Severe COPD-continue medical management Sleep apnea -supportive care continue with BiPAP   I have personally seen and evaluated the patient, evaluated laboratory and imaging results, formulated the assessment and plan and placed orders. The Patient requires high complexity decision making with multiple systems involvement.  Rounds were done with the Respiratory Therapy Director and Staff therapists and discussed with nursing staff also.  Yevonne Pax, MD [facility] Pulmonary Critical Care Medicine Sleep Medicine

## 2022-11-18 NOTE — Progress Notes (Addendum)
Pulmonary Critical Care Medicine [facility] [place]   PULMONARY CRITICAL CARE SERVICE  PROGRESS NOTE     Eulah Frutos Ellis-Hill  JYN:[id number]  DOB: Apr 10, 1951   DOA: 09/27/2022  Referring Physician: Luna Kitchens, MD  HPI: Davia Lautzenheiser Ellis-Hill is a 71 y.o. female being followed for ventilator/airway/oxygen weaning Acute on Chronic Respiratory Failure.  Patient seen lying in bed, currently remains on half liter nasal cannula.  Continues to do well with BiPAP at nocturnal hours.  Medications: Reviewed on Rounds  Physical Exam:  Vitals: Temp 96.0, pulse 62, respirations 22, BP 120s over 50, SpO2 98%  Ventilator Settings 0.5 L nasal cannula  General: Comfortable at this time Neck: supple Cardiovascular: no malignant arrhythmias Respiratory: Bilateral diminished Skin: no rash seen on limited exam Musculoskeletal: No gross abnormality Psychiatric:unable to assess Neurologic:no involuntary movements         Lab Data:   Basic Metabolic Panel: Recent Labs  Lab 11/12/22 0224 11/14/22 0128 11/15/22 0138 11/15/22 1517 11/15/22 1758 11/16/22 0125 11/17/22 0301  NA 127* 126* 125*  --   --  128* 129*  K 4.9 5.2* 5.7* 5.5* 5.5* 4.8 5.1  CL 85* 85* 86*  --   --  85* 89*  CO2 32 34* 30  --   --  35* 32  GLUCOSE 140* 150* 108*  --   --  116* 102*  BUN 42* 38* 43*  --   --  39* 37*  CREATININE 1.11* 1.10* 1.19*  --   --  0.86 0.74  CALCIUM 8.1* 8.1* 8.3*  --   --  8.2* 8.2*  MG  --  1.9  --   --   --  2.1  --     ABG: No results for input(s): "PHART", "PCO2ART", "PO2ART", "HCO3", "O2SAT" in the last 168 hours.  Liver Function Tests: No results for input(s): "AST", "ALT", "ALKPHOS", "BILITOT", "PROT", "ALBUMIN" in the last 168 hours. No results for input(s): "LIPASE", "AMYLASE" in the last 168 hours. No results for input(s): "AMMONIA" in the last 168 hours.  CBC: Recent Labs  Lab 11/12/22 0224 11/14/22 0128 11/15/22 0138 11/16/22 0125 11/17/22 0301   WBC 6.5 7.2 9.2 6.4 6.9  HGB 7.8* 7.9* 8.2* 7.5* 7.6*  HCT 24.1* 24.2* 25.5* 24.0* 23.5*  MCV 98.4 99.6 100.4* 103.4* 100.4*  PLT 119* 118* 142* 116* 113*    Cardiac Enzymes: No results for input(s): "CKTOTAL", "CKMB", "CKMBINDEX", "TROPONINI" in the last 168 hours.  BNP (last 3 results) No results for input(s): "BNP" in the last 8760 hours.  ProBNP (last 3 results) No results for input(s): "PROBNP" in the last 8760 hours.  Radiological Exams: No results found.  Assessment/Plan Active Problems:   Acute on chronic respiratory failure with hypoxia (HCC)   Multifocal pneumonia   Acute metabolic encephalopathy   COPD, severe (HCC)   Obstructive sleep apnea   Acute on chronic respiratory failure hypoxia -we will continue with the BiPAP at nighttime while asleep continue with pulmonary toilet supportive care Multifocal pneumonia -has been treated we will continue to monitor. Metabolic encephalopathy -no change Severe COPD-continue medical management Sleep apnea -supportive care continue with BiPAP   I have personally seen and evaluated the patient, evaluated laboratory and imaging results, formulated the assessment and plan and placed orders. The Patient requires high complexity decision making with multiple systems involvement.  Rounds were done with the Respiratory Therapy Director and Staff therapists and discussed with nursing staff also.  Yevonne Pax, MD [facility] Pulmonary  Critical Care Medicine Sleep Medicine

## 2022-11-19 DIAGNOSIS — J9621 Acute and chronic respiratory failure with hypoxia: Secondary | ICD-10-CM | POA: Diagnosis not present

## 2022-11-19 DIAGNOSIS — G9341 Metabolic encephalopathy: Secondary | ICD-10-CM | POA: Diagnosis not present

## 2022-11-19 DIAGNOSIS — J449 Chronic obstructive pulmonary disease, unspecified: Secondary | ICD-10-CM | POA: Diagnosis not present

## 2022-11-19 DIAGNOSIS — J189 Pneumonia, unspecified organism: Secondary | ICD-10-CM | POA: Diagnosis not present

## 2022-11-19 LAB — CBC
HCT: 27.7 % — ABNORMAL LOW (ref 36.0–46.0)
Hemoglobin: 8.6 g/dL — ABNORMAL LOW (ref 12.0–15.0)
MCH: 31.9 pg (ref 26.0–34.0)
MCHC: 31 g/dL (ref 30.0–36.0)
MCV: 102.6 fL — ABNORMAL HIGH (ref 80.0–100.0)
Platelets: 143 10*3/uL — ABNORMAL LOW (ref 150–400)
RBC: 2.7 MIL/uL — ABNORMAL LOW (ref 3.87–5.11)
RDW: 15.4 % (ref 11.5–15.5)
WBC: 9.5 10*3/uL (ref 4.0–10.5)
nRBC: 0 % (ref 0.0–0.2)

## 2022-11-19 LAB — BASIC METABOLIC PANEL
Anion gap: 11 (ref 5–15)
BUN: 32 mg/dL — ABNORMAL HIGH (ref 8–23)
CO2: 33 mmol/L — ABNORMAL HIGH (ref 22–32)
Calcium: 9 mg/dL (ref 8.9–10.3)
Chloride: 90 mmol/L — ABNORMAL LOW (ref 98–111)
Creatinine, Ser: 0.85 mg/dL (ref 0.44–1.00)
GFR, Estimated: >60 mL/min (ref >60)
Glucose, Bld: 161 mg/dL — ABNORMAL HIGH (ref 70–99)
Potassium: 4.8 mmol/L (ref 3.5–5.1)
Sodium: 134 mmol/L — ABNORMAL LOW (ref 135–145)

## 2022-11-19 LAB — MAGNESIUM: Magnesium: 2 mg/dL (ref 1.7–2.4)

## 2022-11-19 NOTE — Progress Notes (Signed)
Pulmonary Critical Care Medicine [facility] [place]   PULMONARY CRITICAL CARE SERVICE  PROGRESS NOTE     [name]  QMV:[id number]  DOB: 1951/11/01   DOA: 09/27/2022  Referring Physician: Luna Kitchens, MD  HPI: [name] is a 71 y.o. female being followed for ventilator/airway/oxygen weaning Acute on Chronic Respiratory Failure.  Patient is comfortable at this time without distress has been afebrile using BiPAP at nighttime  Medications: Reviewed on Rounds  Physical Exam:  Vitals: Temperature is 97.6 pulse 64 respiratory rate is 34 blood pressure 151/60 saturations 99%  Ventilator Settings on BiPAP  General: Comfortable at this time Neck: supple Cardiovascular: no malignant arrhythmias Respiratory: Clear rhonchi no rales are noted Skin: no rash seen on limited exam Musculoskeletal: No gross abnormality Psychiatric:unable to assess Neurologic:no involuntary movements         Lab Data:   Basic Metabolic Panel: Recent Labs  Lab 11/14/22 0128 11/15/22 0138 11/15/22 1517 11/15/22 1758 11/16/22 0125 11/17/22 0301 11/19/22 0116  NA 126* 125*  --   --  128* 129* 134*  K 5.2* 5.7* 5.5* 5.5* 4.8 5.1 4.8  CL 85* 86*  --   --  85* 89* 90*  CO2 34* 30  --   --  35* 32 33*  GLUCOSE 150* 108*  --   --  116* 102* 161*  BUN 38* 43*  --   --  39* 37* 32*  CREATININE 1.10* 1.19*  --   --  0.86 0.74 0.85  CALCIUM 8.1* 8.3*  --   --  8.2* 8.2* 9.0  MG 1.9  --   --   --  2.1  --  2.0    ABG: No results for input(s): "PHART", "PCO2ART", "PO2ART", "HCO3", "O2SAT" in the last 168 hours.  Liver Function Tests: No results for input(s): "AST", "ALT", "ALKPHOS", "BILITOT", "PROT", "ALBUMIN" in the last 168 hours. No results for input(s): "LIPASE", "AMYLASE" in the last 168 hours. No results for input(s): "AMMONIA" in the last 168 hours.  CBC: Recent Labs  Lab 11/14/22 0128 11/15/22 0138 11/16/22 0125 11/17/22 0301 11/19/22 0116  WBC  7.2 9.2 6.4 6.9 9.5  HGB 7.9* 8.2* 7.5* 7.6* 8.6*  HCT 24.2* 25.5* 24.0* 23.5* 27.7*  MCV 99.6 100.4* 103.4* 100.4* 102.6*  PLT 118* 142* 116* 113* 143*    Cardiac Enzymes: No results for input(s): "CKTOTAL", "CKMB", "CKMBINDEX", "TROPONINI" in the last 168 hours.  BNP (last 3 results) No results for input(s): "BNP" in the last 8760 hours.  ProBNP (last 3 results) No results for input(s): "PROBNP" in the last 8760 hours.  Radiological Exams: DG Abd Portable 1V  Result Date: 11/18/2022 CLINICAL DATA:  Ileus. EXAM: PORTABLE ABDOMEN - 1 VIEW COMPARISON:  11/15/2022 FINDINGS: Very limited examination due to body habitus. No distended bowel loops to suggest obstruction or ileus. Feeding gastrostomy tube appears to be in good position. IMPRESSION: As above. Electronically Signed   By: Rudie Meyer M.D.   On: 11/18/2022 13:39    Assessment/Plan Active Problems:   Acute on chronic respiratory failure with hypoxia (HCC)   Multifocal pneumonia   Acute metabolic encephalopathy   COPD, severe (HCC)   Obstructive sleep apnea   Acute on chronic respiratory failure hypoxia we will continue with BiPAP at nighttime patient seems to be doing better Multifocal pneumonia no change we will continue to follow along Metabolic encephalopathy no change at this time we will continue with supportive care Severe COPD medical management Obstructive sleep  apnea at baseline   I have personally seen and evaluated the patient, evaluated laboratory and imaging results, formulated the assessment and plan and placed orders. The Patient requires high complexity decision making with multiple systems involvement.  Rounds were done with the Respiratory Therapy Director and Staff therapists and discussed with nursing staff also.  Yevonne Pax, MD [facility] Pulmonary Critical Care Medicine Sleep Medicine

## 2022-11-20 DIAGNOSIS — J9621 Acute and chronic respiratory failure with hypoxia: Secondary | ICD-10-CM | POA: Diagnosis not present

## 2022-11-20 DIAGNOSIS — J189 Pneumonia, unspecified organism: Secondary | ICD-10-CM | POA: Diagnosis not present

## 2022-11-20 DIAGNOSIS — G9341 Metabolic encephalopathy: Secondary | ICD-10-CM | POA: Diagnosis not present

## 2022-11-20 DIAGNOSIS — J449 Chronic obstructive pulmonary disease, unspecified: Secondary | ICD-10-CM | POA: Diagnosis not present

## 2022-11-20 NOTE — Progress Notes (Signed)
Pulmonary Critical Care Medicine [facility] [place]   PULMONARY CRITICAL CARE SERVICE  PROGRESS NOTE     [name]  ZOX:[id number]  DOB: 11-04-1951   DOA: 09/27/2022  Referring Physician: Luna Kitchens, MD  HPI: [name] is a 71 y.o. female being followed for ventilator/airway/oxygen weaning Acute on Chronic Respiratory Failure.  Patient is on nasal cannula using BiPAP at nighttime she is doing relatively well  Medications: Reviewed on Rounds  Physical Exam:  Vitals: Temperature is 97.2 pulse 63 respiratory 19 blood pressure is 136/71 saturations 97%  Ventilator Settings on nasal cannula off the BiPAP this morning  General: Comfortable at this time Neck: supple Cardiovascular: no malignant arrhythmias Respiratory: No rhonchi no rales are noted at this time Skin: no rash seen on limited exam Musculoskeletal: No gross abnormality Psychiatric:unable to assess Neurologic:no involuntary movements         Lab Data:   Basic Metabolic Panel: Recent Labs  Lab 11/14/22 0128 11/15/22 0138 11/15/22 1517 11/15/22 1758 11/16/22 0125 11/17/22 0301 11/19/22 0116  NA 126* 125*  --   --  128* 129* 134*  K 5.2* 5.7* 5.5* 5.5* 4.8 5.1 4.8  CL 85* 86*  --   --  85* 89* 90*  CO2 34* 30  --   --  35* 32 33*  GLUCOSE 150* 108*  --   --  116* 102* 161*  BUN 38* 43*  --   --  39* 37* 32*  CREATININE 1.10* 1.19*  --   --  0.86 0.74 0.85  CALCIUM 8.1* 8.3*  --   --  8.2* 8.2* 9.0  MG 1.9  --   --   --  2.1  --  2.0    ABG: No results for input(s): "PHART", "PCO2ART", "PO2ART", "HCO3", "O2SAT" in the last 168 hours.  Liver Function Tests: No results for input(s): "AST", "ALT", "ALKPHOS", "BILITOT", "PROT", "ALBUMIN" in the last 168 hours. No results for input(s): "LIPASE", "AMYLASE" in the last 168 hours. No results for input(s): "AMMONIA" in the last 168 hours.  CBC: Recent Labs  Lab 11/14/22 0128 11/15/22 0138 11/16/22 0125  11/17/22 0301 11/19/22 0116  WBC 7.2 9.2 6.4 6.9 9.5  HGB 7.9* 8.2* 7.5* 7.6* 8.6*  HCT 24.2* 25.5* 24.0* 23.5* 27.7*  MCV 99.6 100.4* 103.4* 100.4* 102.6*  PLT 118* 142* 116* 113* 143*    Cardiac Enzymes: No results for input(s): "CKTOTAL", "CKMB", "CKMBINDEX", "TROPONINI" in the last 168 hours.  BNP (last 3 results) No results for input(s): "BNP" in the last 8760 hours.  ProBNP (last 3 results) No results for input(s): "PROBNP" in the last 8760 hours.  Radiological Exams: DG Abd Portable 1V  Result Date: 11/18/2022 CLINICAL DATA:  Ileus. EXAM: PORTABLE ABDOMEN - 1 VIEW COMPARISON:  11/15/2022 FINDINGS: Very limited examination due to body habitus. No distended bowel loops to suggest obstruction or ileus. Feeding gastrostomy tube appears to be in good position. IMPRESSION: As above. Electronically Signed   By: Rudie Meyer M.D.   On: 11/18/2022 13:39    Assessment/Plan Active Problems:   Acute on chronic respiratory failure with hypoxia (HCC)   Multifocal pneumonia   Acute metabolic encephalopathy   COPD, severe (HCC)   Obstructive sleep apnea   Acute on chronic respiratory failure hypoxia we will continue with BiPAP at nighttime nasal cannula during daytime continue with secretion management supportive care Metabolic encephalopathy overall no change Severe COPD medical management we will continue to monitor Obstructive sleep apnea using  BiPAP at nighttime we will continue Multifocal pneumonia has been treated with antibiotics we will continue with supportive care   I have personally seen and evaluated the patient, evaluated laboratory and imaging results, formulated the assessment and plan and placed orders. The Patient requires high complexity decision making with multiple systems involvement.  Rounds were done with the Respiratory Therapy Director and Staff therapists and discussed with nursing staff also.  Yevonne Pax, MD [facility] Pulmonary Critical Care  Medicine Sleep Medicine

## 2022-11-21 ENCOUNTER — Other Ambulatory Visit (HOSPITAL_COMMUNITY): Payer: Medicare Other

## 2022-11-21 DIAGNOSIS — J189 Pneumonia, unspecified organism: Secondary | ICD-10-CM | POA: Diagnosis not present

## 2022-11-21 DIAGNOSIS — G9341 Metabolic encephalopathy: Secondary | ICD-10-CM | POA: Diagnosis not present

## 2022-11-21 DIAGNOSIS — J9621 Acute and chronic respiratory failure with hypoxia: Secondary | ICD-10-CM | POA: Diagnosis not present

## 2022-11-21 DIAGNOSIS — J449 Chronic obstructive pulmonary disease, unspecified: Secondary | ICD-10-CM | POA: Diagnosis not present

## 2022-11-21 LAB — CBC
HCT: 23.6 % — ABNORMAL LOW (ref 36.0–46.0)
HCT: 27.5 % — ABNORMAL LOW (ref 36.0–46.0)
Hemoglobin: 7.7 g/dL — ABNORMAL LOW (ref 12.0–15.0)
Hemoglobin: 8.7 g/dL — ABNORMAL LOW (ref 12.0–15.0)
MCH: 32.4 pg (ref 26.0–34.0)
MCH: 31.9 pg (ref 26.0–34.0)
MCHC: 32.6 g/dL (ref 30.0–36.0)
MCHC: 31.6 g/dL (ref 30.0–36.0)
MCV: 99.2 fL (ref 80.0–100.0)
MCV: 100.7 fL — ABNORMAL HIGH (ref 80.0–100.0)
Platelets: 120 10*3/uL — ABNORMAL LOW (ref 150–400)
Platelets: 115 10*3/uL — ABNORMAL LOW (ref 150–400)
RBC: 2.38 MIL/uL — ABNORMAL LOW (ref 3.87–5.11)
RBC: 2.73 MIL/uL — ABNORMAL LOW (ref 3.87–5.11)
RDW: 15.3 % (ref 11.5–15.5)
RDW: 15.2 % (ref 11.5–15.5)
WBC: 7.9 10*3/uL (ref 4.0–10.5)
WBC: 9.1 10*3/uL (ref 4.0–10.5)
nRBC: 0 % (ref 0.0–0.2)
nRBC: 0 % (ref 0.0–0.2)

## 2022-11-21 LAB — MAGNESIUM: Magnesium: 1.9 mg/dL (ref 1.7–2.4)

## 2022-11-21 LAB — BASIC METABOLIC PANEL
Anion gap: 6 (ref 5–15)
BUN: 24 mg/dL — ABNORMAL HIGH (ref 8–23)
CO2: 36 mmol/L — ABNORMAL HIGH (ref 22–32)
Calcium: 8.5 mg/dL — ABNORMAL LOW (ref 8.9–10.3)
Chloride: 90 mmol/L — ABNORMAL LOW (ref 98–111)
Creatinine, Ser: 0.71 mg/dL (ref 0.44–1.00)
GFR, Estimated: >60 mL/min (ref >60)
Glucose, Bld: 122 mg/dL — ABNORMAL HIGH (ref 70–99)
Potassium: 4.8 mmol/L (ref 3.5–5.1)
Sodium: 132 mmol/L — ABNORMAL LOW (ref 135–145)

## 2022-11-21 LAB — LACTIC ACID, PLASMA: Lactic Acid, Venous: 1 mmol/L (ref 0.5–1.9)

## 2022-11-21 LAB — BRAIN NATRIURETIC PEPTIDE: B Natriuretic Peptide: 322.4 pg/mL — ABNORMAL HIGH (ref 0.0–100.0)

## 2022-11-21 NOTE — Progress Notes (Signed)
Pulmonary Critical Care Medicine [facility] [place]   PULMONARY CRITICAL CARE SERVICE  PROGRESS NOTE     Macrina Isenbarger Bridges  FAO:[id number]  DOB: 1951-10-09   DOA: 09/27/2022  Referring Physician: Luna Kitchens, MD  HPI: [name] is a 71 y.o. female being followed for ventilator/airway/oxygen weaning Acute on Chronic Respiratory Failure.  Patient was having increasing oxygen requirements chest x-ray showed increased bilateral diffuse infiltrates suggestive of fluid overload  Medications: Reviewed on Rounds  Physical Exam:  Vitals: Temperature is 98.2 pulse 72 respiratory 29 blood pressure 136/58 saturations 95%  Ventilator Settings use BiPAP  General: Comfortable at this time Neck: supple Cardiovascular: no malignant arrhythmias Respiratory: Scattered coarse rhonchi Skin: no rash seen on limited exam Musculoskeletal: No gross abnormality Psychiatric:unable to assess Neurologic:no involuntary movements         Lab Data:   Basic Metabolic Panel: Recent Labs  Lab 11/15/22 0138 11/15/22 1517 11/15/22 1758 11/16/22 0125 11/17/22 0301 11/19/22 0116 11/21/22 0144  NA 125*  --   --  128* 129* 134* 132*  K 5.7*   < > 5.5* 4.8 5.1 4.8 4.8  CL 86*  --   --  85* 89* 90* 90*  CO2 30  --   --  35* 32 33* 36*  GLUCOSE 108*  --   --  116* 102* 161* 122*  BUN 43*  --   --  39* 37* 32* 24*  CREATININE 1.19*  --   --  0.86 0.74 0.85 0.71  CALCIUM 8.3*  --   --  8.2* 8.2* 9.0 8.5*  MG  --   --   --  2.1  --  2.0 1.9   < > = values in this interval not displayed.    ABG: No results for input(s): "PHART", "PCO2ART", "PO2ART", "HCO3", "O2SAT" in the last 168 hours.  Liver Function Tests: No results for input(s): "AST", "ALT", "ALKPHOS", "BILITOT", "PROT", "ALBUMIN" in the last 168 hours. No results for input(s): "LIPASE", "AMYLASE" in the last 168 hours. No results for input(s): "AMMONIA" in the last 168 hours.  CBC: Recent Labs  Lab  11/16/22 0125 11/17/22 0301 11/19/22 0116 11/21/22 0144 11/21/22 1211  WBC 6.4 6.9 9.5 7.9 9.1  HGB 7.5* 7.6* 8.6* 7.7* 8.7*  HCT 24.0* 23.5* 27.7* 23.6* 27.5*  MCV 103.4* 100.4* 102.6* 99.2 100.7*  PLT 116* 113* 143* 120* 115*    Cardiac Enzymes: No results for input(s): "CKTOTAL", "CKMB", "CKMBINDEX", "TROPONINI" in the last 168 hours.  BNP (last 3 results) Recent Labs    11/21/22 1211  BNP 322.4*    ProBNP (last 3 results) No results for input(s): "PROBNP" in the last 8760 hours.  Radiological Exams: DG Chest Port 1 View  Result Date: 11/21/2022 CLINICAL DATA:  Shortness of breath. EXAM: PORTABLE CHEST 1 VIEW COMPARISON:  November 15, 2022. FINDINGS: Stable cardiomediastinal silhouette. Interval development of diffuse patchy airspace opacities throughout both lungs most consistent with multifocal pneumonia. Small right pleural effusion is noted. Bony thorax is unremarkable. IMPRESSION: Interval development of diffuse bilateral patchy airspace opacities most consistent multifocal pneumonia. Small right pleural effusion. Electronically Signed   By: Lupita Raider M.D.   On: 11/21/2022 10:57    Assessment/Plan Active Problems:   Acute on chronic respiratory failure with hypoxia (HCC)   Multifocal pneumonia   Acute metabolic encephalopathy   COPD, severe (HCC)   Obstructive sleep apnea   Acute on chronic respiratory failure hypoxia we will continue with the BiPAP as  ordered continue pulmonary toilet supportive care Multifocal pneumonia we will continue with supportive care patient now has chest x-ray with increasing infiltrates Metabolic encephalopathy she is at baseline Severe COPD medical management Struct of sleep apnea encourage compliance with BiPAP   I have personally seen and evaluated the patient, evaluated laboratory and imaging results, formulated the assessment and plan and placed orders. The Patient requires high complexity decision making with multiple  systems involvement.  Rounds were done with the Respiratory Therapy Director and Staff therapists and discussed with nursing staff also.  Yevonne Pax, MD [facility] Pulmonary Critical Care Medicine Sleep Medicine

## 2022-11-22 DIAGNOSIS — J449 Chronic obstructive pulmonary disease, unspecified: Secondary | ICD-10-CM | POA: Diagnosis not present

## 2022-11-22 DIAGNOSIS — J189 Pneumonia, unspecified organism: Secondary | ICD-10-CM | POA: Diagnosis not present

## 2022-11-22 DIAGNOSIS — G9341 Metabolic encephalopathy: Secondary | ICD-10-CM | POA: Diagnosis not present

## 2022-11-22 DIAGNOSIS — J9621 Acute and chronic respiratory failure with hypoxia: Secondary | ICD-10-CM | POA: Diagnosis not present

## 2022-11-22 NOTE — Progress Notes (Signed)
Pulmonary Critical Care Medicine [facility] [place]   PULMONARY CRITICAL CARE SERVICE  PROGRESS NOTE     [name]  ZOX:[id number]  DOB: 24-May-1951   DOA: 09/27/2022  Referring Physician: Luna Kitchens, MD  HPI: [name] is a 71 y.o. female being followed for ventilator/airway/oxygen weaning Acute on Chronic Respiratory Failure.  Patient continues to be on BiPAP and required 4 L of oxygen.  Chest x-ray still showing diffuse bilateral pulmonary infiltrates more likely CHF  Medications: Reviewed on Rounds  Physical Exam:  Vitals: Temperature is 96.2 pulse of 68 respiratory rate 25 blood pressure 143/60 saturations are 98%  Ventilator Settings on BiPAP and 4 L of O2  General: Comfortable at this time Neck: supple Cardiovascular: no malignant arrhythmias Respiratory: Scattered rhonchi expansion is equal Skin: no rash seen on limited exam Musculoskeletal: No gross abnormality Psychiatric:unable to assess Neurologic:no involuntary movements         Lab Data:   Basic Metabolic Panel: Recent Labs  Lab 11/16/22 0125 11/17/22 0301 11/19/22 0116 11/21/22 0144  NA 128* 129* 134* 132*  K 4.8 5.1 4.8 4.8  CL 85* 89* 90* 90*  CO2 35* 32 33* 36*  GLUCOSE 116* 102* 161* 122*  BUN 39* 37* 32* 24*  CREATININE 0.86 0.74 0.85 0.71  CALCIUM 8.2* 8.2* 9.0 8.5*  MG 2.1  --  2.0 1.9    ABG: No results for input(s): "PHART", "PCO2ART", "PO2ART", "HCO3", "O2SAT" in the last 168 hours.  Liver Function Tests: No results for input(s): "AST", "ALT", "ALKPHOS", "BILITOT", "PROT", "ALBUMIN" in the last 168 hours. No results for input(s): "LIPASE", "AMYLASE" in the last 168 hours. No results for input(s): "AMMONIA" in the last 168 hours.  CBC: Recent Labs  Lab 11/16/22 0125 11/17/22 0301 11/19/22 0116 11/21/22 0144 11/21/22 1211  WBC 6.4 6.9 9.5 7.9 9.1  HGB 7.5* 7.6* 8.6* 7.7* 8.7*  HCT 24.0* 23.5* 27.7* 23.6* 27.5*  MCV 103.4* 100.4*  102.6* 99.2 100.7*  PLT 116* 113* 143* 120* 115*    Cardiac Enzymes: No results for input(s): "CKTOTAL", "CKMB", "CKMBINDEX", "TROPONINI" in the last 168 hours.  BNP (last 3 results) Recent Labs    11/21/22 1211  BNP 322.4*    ProBNP (last 3 results) No results for input(s): "PROBNP" in the last 8760 hours.  Radiological Exams: DG Chest Port 1 View  Result Date: 11/21/2022 CLINICAL DATA:  Shortness of breath. EXAM: PORTABLE CHEST 1 VIEW COMPARISON:  November 15, 2022. FINDINGS: Stable cardiomediastinal silhouette. Interval development of diffuse patchy airspace opacities throughout both lungs most consistent with multifocal pneumonia. Small right pleural effusion is noted. Bony thorax is unremarkable. IMPRESSION: Interval development of diffuse bilateral patchy airspace opacities most consistent multifocal pneumonia. Small right pleural effusion. Electronically Signed   By: Lupita Raider M.D.   On: 11/21/2022 10:57    Assessment/Plan Active Problems:   Acute on chronic respiratory failure with hypoxia (HCC)   Multifocal pneumonia   Acute metabolic encephalopathy   COPD, severe (HCC)   Obstructive sleep apnea   Acute on chronic respiratory failure with hypoxia we will continue with BiPAP and continue with oxygen therapy at 4 L flow rate Multifocal pneumonia has been treated we will continue to follow along closely Metabolic encephalopathy no changes noted overall Obstructive sleep apnea she is using the BiPAP along with supplemental oxygen Severe COPD medical management   I have personally seen and evaluated the patient, evaluated laboratory and imaging results, formulated the assessment and plan and  placed orders. The Patient requires high complexity decision making with multiple systems involvement.  Rounds were done with the Respiratory Therapy Director and Staff therapists and discussed with nursing staff also.  Yevonne Pax, MD [facility] Pulmonary Critical Care  Medicine Sleep Medicine

## 2022-11-23 ENCOUNTER — Other Ambulatory Visit (HOSPITAL_COMMUNITY): Payer: Medicare Other

## 2022-11-23 DIAGNOSIS — J449 Chronic obstructive pulmonary disease, unspecified: Secondary | ICD-10-CM | POA: Diagnosis not present

## 2022-11-23 DIAGNOSIS — G9341 Metabolic encephalopathy: Secondary | ICD-10-CM | POA: Diagnosis not present

## 2022-11-23 DIAGNOSIS — J189 Pneumonia, unspecified organism: Secondary | ICD-10-CM | POA: Diagnosis not present

## 2022-11-23 DIAGNOSIS — J9621 Acute and chronic respiratory failure with hypoxia: Secondary | ICD-10-CM | POA: Diagnosis not present

## 2022-11-23 LAB — EXPECTORATED SPUTUM ASSESSMENT W GRAM STAIN, RFLX TO RESP C

## 2022-11-23 LAB — CBC
HCT: 21.1 % — ABNORMAL LOW (ref 36.0–46.0)
Hemoglobin: 6.8 g/dL — CL (ref 12.0–15.0)
MCH: 32.2 pg (ref 26.0–34.0)
MCHC: 32.2 g/dL (ref 30.0–36.0)
MCV: 100 fL (ref 80.0–100.0)
Platelets: 110 10*3/uL — ABNORMAL LOW (ref 150–400)
RBC: 2.11 MIL/uL — ABNORMAL LOW (ref 3.87–5.11)
RDW: 14.9 % (ref 11.5–15.5)
WBC: 5.7 10*3/uL (ref 4.0–10.5)
nRBC: 0 % (ref 0.0–0.2)

## 2022-11-23 LAB — BASIC METABOLIC PANEL
Anion gap: 9 (ref 5–15)
BUN: 25 mg/dL — ABNORMAL HIGH (ref 8–23)
CO2: 40 mmol/L — ABNORMAL HIGH (ref 22–32)
Calcium: 8.5 mg/dL — ABNORMAL LOW (ref 8.9–10.3)
Chloride: 87 mmol/L — ABNORMAL LOW (ref 98–111)
Creatinine, Ser: 0.84 mg/dL (ref 0.44–1.00)
GFR, Estimated: >60 mL/min (ref >60)
Glucose, Bld: 112 mg/dL — ABNORMAL HIGH (ref 70–99)
Potassium: 4.2 mmol/L (ref 3.5–5.1)
Sodium: 136 mmol/L (ref 135–145)

## 2022-11-23 LAB — HEMOGLOBIN AND HEMATOCRIT, BLOOD
HCT: 25.7 % — ABNORMAL LOW (ref 36.0–46.0)
Hemoglobin: 7.9 g/dL — ABNORMAL LOW (ref 12.0–15.0)

## 2022-11-23 LAB — PREPARE RBC (CROSSMATCH)

## 2022-11-23 MED ORDER — IOHEXOL 350 MG/ML SOLN
70.0000 mL | Freq: Once | INTRAVENOUS | Status: AC | PRN
  Administered 2022-11-23: 70 mL via INTRAVENOUS

## 2022-11-23 NOTE — Progress Notes (Signed)
Pulmonary [place]   PULMONARY CRITICAL CARE SERVICE  PROGRESS NOTE     [name]  VZD:[id number]  DOB: [birth year]   DOA: 09/27/2022  Referring Physician: Satira Sark, MD  HPI: [name] is a 71 y.o. female being followed for ventilator/airway/oxygen weaning Acute on Chronic Respiratory Failure.  Patient is on nasal cannula at this time she actually looked good this morning however chest x-ray still showing significant infiltrates.  Spoke with primary care team will be getting CT of the chest to get a better look at things determine whether this is infection versus CHF  Medications: Reviewed on Rounds  Physical Exam:  Vitals: Temperature is 98.5 pulse 67 respiratory rate is 14 blood pressure 127/61 saturation is 99%  Ventilator Settings off the ventilator on nasal cannula  General: Comfortable at this time Neck: supple Cardiovascular: no malignant arrhythmias Respiratory: No rhonchi very coarse breath sounds Skin: no rash seen on limited exam Musculoskeletal: No gross abnormality Psychiatric:unable to assess Neurologic:no involuntary movements         Lab Data:   Basic Metabolic Panel: Recent Labs  Lab 11/17/22 0301 11/19/22 0116 11/21/22 0144 11/23/22 0224  NA 129* 134* 132* 136  K 5.1 4.8 4.8 4.2  CL 89* 90* 90* 87*  CO2 32 33* 36* 40*  GLUCOSE 102* 161* 122* 112*  BUN 37* 32* 24* 25*  CREATININE 0.74 0.85 0.71 0.84  CALCIUM 8.2* 9.0 8.5* 8.5*  MG  --  2.0 1.9  --     ABG: No results for input(s): "PHART", "PCO2ART", "PO2ART", "HCO3", "O2SAT" in the last 168 hours.  Liver Function Tests: No results for input(s): "AST", "ALT", "ALKPHOS", "BILITOT", "PROT", "ALBUMIN" in the last 168 hours. No results for input(s): "LIPASE", "AMYLASE" in the last 168 hours. No results for input(s): "AMMONIA" in the last 168 hours.  CBC: Recent Labs  Lab 11/17/22 0301 11/19/22 0116 11/21/22 0144  11/21/22 1211 11/23/22 0224 11/23/22 1150  WBC 6.9 9.5 7.9 9.1 5.7  --   HGB 7.6* 8.6* 7.7* 8.7* 6.8* 7.9*  HCT 23.5* 27.7* 23.6* 27.5* 21.1* 25.7*  MCV 100.4* 102.6* 99.2 100.7* 100.0  --   PLT 113* 143* 120* 115* 110*  --     Cardiac Enzymes: No results for input(s): "CKTOTAL", "CKMB", "CKMBINDEX", "TROPONINI" in the last 168 hours.  BNP (last 3 results) Recent Labs    11/21/22 1211  BNP 322.4*    ProBNP (last 3 results) No results for input(s): "PROBNP" in the last 8760 hours.  Radiological Exams: CT Angio Chest Pulmonary Embolism (PE) W or WO Contrast  Result Date: 11/23/2022 CLINICAL DATA:  Concern for pulmonary embolism. Short of breath and abdominal pain. EXAM: CT ANGIOGRAPHY CHEST CT ABDOMEN AND PELVIS WITH CONTRAST TECHNIQUE: Multidetector CT imaging of the chest was performed using the standard protocol during bolus administration of intravenous contrast. Multiplanar CT image reconstructions and MIPs were obtained to evaluate the vascular anatomy. Multidetector CT imaging of the abdomen and pelvis was performed using the standard protocol during bolus administration of intravenous contrast. RADIATION DOSE REDUCTION: This exam was performed according to the departmental dose-optimization program which includes automated exposure control, adjustment of the mA and/or kV according to patient size and/or use of iterative reconstruction technique. CONTRAST:  19m OMNIPAQUE IOHEXOL 350 MG/ML SOLN COMPARISON:  CT 12/01/2017, 03/15/2017 FINDINGS: CTA CHEST FINDINGS Cardiovascular: No filling defects within the pulmonary arteries to suggest acute pulmonary embolism. Mediastinum/Nodes: Small mediastinal lymph nodes are not pathologic  by size criteria. No pericardial effusion. Trachea esophagus normal. No axillary supraclavicular adenopathy. Lungs/Pleura: There is dense bibasilar consolidation and airspace opacities. Pulmonary involvement extends to the posterior aspect of the upper lobes  with patchy airspace densities. Bilateral pleural effusions. Musculoskeletal: No chest wall abnormality. No acute or significant osseous findings. Review of the MIP images confirms the above findings. CT ABDOMEN and PELVIS FINDINGS Hepatobiliary: Liver has a nodular contour. Caudate lobe is enlarged. Postcholecystectomy. Pancreas: Pancreas is normal. No ductal dilatation. No pancreatic inflammation. Spleen: Normal spleen Adrenals/urinary tract: Adrenal glands and kidneys are normal. The ureters and bladder normal. Bladder collapsed. Suprapubic catheter in place Stomach/Bowel: Peg tube in stomach. Small bowel normal. Colon normal. No bowel obstruction or inflammation. Several diverticula sigmoid colon. Vascular/Lymphatic: Abdominal aorta is normal caliber. No periportal or retroperitoneal adenopathy. No pelvic adenopathy. Reproductive: Post hysterectomy.  Adnexa unremarkable Other: No free fluid. Musculoskeletal: No aggressive osseous lesion. Review of the MIP images confirms the above findings. IMPRESSION: Chest Impression: 1. Dense bilateral airspace disease in the upper lower lobes consistent with MULTIFOCAL PNEUMONIA. 2. Moderate RIGHT effusion.  Small LEFT effusion. 3. No evidence acute pulmonary embolism. Abdomen / Pelvis Impression: 1. No acute findings in the abdomen pelvis. 2. Morphologic changes in liver consistent cirrhosis. 3. No bowel inflammation or obstruction. 4. Suprapubic catheter and PEG tube appear appropriately position. Electronically Signed   By: Suzy Bouchard M.D.   On: 11/23/2022 14:21   CT ABDOMEN PELVIS W CONTRAST  Result Date: 11/23/2022 CLINICAL DATA:  Concern for pulmonary embolism. Short of breath and abdominal pain. EXAM: CT ANGIOGRAPHY CHEST CT ABDOMEN AND PELVIS WITH CONTRAST TECHNIQUE: Multidetector CT imaging of the chest was performed using the standard protocol during bolus administration of intravenous contrast. Multiplanar CT image reconstructions and MIPs were obtained  to evaluate the vascular anatomy. Multidetector CT imaging of the abdomen and pelvis was performed using the standard protocol during bolus administration of intravenous contrast. RADIATION DOSE REDUCTION: This exam was performed according to the departmental dose-optimization program which includes automated exposure control, adjustment of the mA and/or kV according to patient size and/or use of iterative reconstruction technique. CONTRAST:  2m OMNIPAQUE IOHEXOL 350 MG/ML SOLN COMPARISON:  CT 12/01/2017, 03/15/2017 FINDINGS: CTA CHEST FINDINGS Cardiovascular: No filling defects within the pulmonary arteries to suggest acute pulmonary embolism. Mediastinum/Nodes: Small mediastinal lymph nodes are not pathologic by size criteria. No pericardial effusion. Trachea esophagus normal. No axillary supraclavicular adenopathy. Lungs/Pleura: There is dense bibasilar consolidation and airspace opacities. Pulmonary involvement extends to the posterior aspect of the upper lobes with patchy airspace densities. Bilateral pleural effusions. Musculoskeletal: No chest wall abnormality. No acute or significant osseous findings. Review of the MIP images confirms the above findings. CT ABDOMEN and PELVIS FINDINGS Hepatobiliary: Liver has a nodular contour. Caudate lobe is enlarged. Postcholecystectomy. Pancreas: Pancreas is normal. No ductal dilatation. No pancreatic inflammation. Spleen: Normal spleen Adrenals/urinary tract: Adrenal glands and kidneys are normal. The ureters and bladder normal. Bladder collapsed. Suprapubic catheter in place Stomach/Bowel: Peg tube in stomach. Small bowel normal. Colon normal. No bowel obstruction or inflammation. Several diverticula sigmoid colon. Vascular/Lymphatic: Abdominal aorta is normal caliber. No periportal or retroperitoneal adenopathy. No pelvic adenopathy. Reproductive: Post hysterectomy.  Adnexa unremarkable Other: No free fluid. Musculoskeletal: No aggressive osseous lesion. Review of  the MIP images confirms the above findings. IMPRESSION: Chest Impression: 1. Dense bilateral airspace disease in the upper lower lobes consistent with MULTIFOCAL PNEUMONIA. 2. Moderate RIGHT effusion.  Small LEFT effusion. 3. No evidence acute pulmonary  embolism. Abdomen / Pelvis Impression: 1. No acute findings in the abdomen pelvis. 2. Morphologic changes in liver consistent cirrhosis. 3. No bowel inflammation or obstruction. 4. Suprapubic catheter and PEG tube appear appropriately position. Electronically Signed   By: Suzy Bouchard M.D.   On: 11/23/2022 14:21   DG CHEST PORT 1 VIEW  Result Date: 11/23/2022 CLINICAL DATA:  Respiratory failure. EXAM: PORTABLE CHEST 1 VIEW COMPARISON:  Radiographs 11/21/2022 and 11/15/2022.  CT 03/15/2017. FINDINGS: 0900 hours. Stable cardiac enlargement. The previously demonstrated diffuse bilateral airspace opacities have partially cleared. There are probable small bilateral pleural effusions. No evidence of pneumothorax or acute osseous abnormality. Telemetry leads overlie the chest. IMPRESSION: Interval partial clearing of diffuse bilateral airspace opacities which may reflect edema or multilobar pneumonia. Probable small bilateral pleural effusions. Electronically Signed   By: Richardean Sale M.D.   On: 11/23/2022 09:14    Assessment/Plan Active Problems:   Acute on chronic respiratory failure with hypoxia (HCC)   Multifocal pneumonia   Acute metabolic encephalopathy   COPD, severe (HCC)   Obstructive sleep apnea   Acute on chronic respiratory failure hypoxia will continue with nasal cannula titrate oxygen as tolerated continue secretion management pulmonary toilet. Multifocal pneumonia has been treated supportive care get the CT scan done and we will take a look at that and see if we need to make any adjustments on the antibiotics.  In addition there is a possibility that this could represent CHF Metabolic encephalopathy no change overall Severe COPD will  continue with inhalers Obstructive sleep apnea using the a positive airway pressure as ordered   I have personally seen and evaluated the patient, evaluated laboratory and imaging results, formulated the assessment and plan and placed orders. The Patient requires high complexity decision making with multiple systems involvement.  Rounds were done with the Respiratory Therapy Director and Staff therapists and discussed with nursing staff also.  [name], MD [facility] Pulmonary Critical Care Medicine Sleep Medicine

## 2022-11-24 ENCOUNTER — Other Ambulatory Visit (HOSPITAL_COMMUNITY): Payer: Medicare Other

## 2022-11-24 DIAGNOSIS — G9341 Metabolic encephalopathy: Secondary | ICD-10-CM | POA: Diagnosis not present

## 2022-11-24 DIAGNOSIS — J189 Pneumonia, unspecified organism: Secondary | ICD-10-CM | POA: Diagnosis not present

## 2022-11-24 DIAGNOSIS — J9621 Acute and chronic respiratory failure with hypoxia: Secondary | ICD-10-CM | POA: Diagnosis not present

## 2022-11-24 DIAGNOSIS — J449 Chronic obstructive pulmonary disease, unspecified: Secondary | ICD-10-CM | POA: Diagnosis not present

## 2022-11-24 HISTORY — PX: IR THORACENTESIS ASP PLEURAL SPACE W/IMG GUIDE: IMG5380

## 2022-11-24 LAB — BPAM RBC
Blood Product Expiration Date: 202312022359
ISSUE DATE / TIME: 202311280637
Unit Type and Rh: 600

## 2022-11-24 LAB — LACTATE DEHYDROGENASE, PLEURAL OR PERITONEAL FLUID: LD, Fluid: 53 U/L — ABNORMAL HIGH (ref 3–23)

## 2022-11-24 LAB — TYPE AND SCREEN
ABO/RH(D): A NEG
Antibody Screen: NEGATIVE
Unit division: 0

## 2022-11-24 LAB — BODY FLUID CELL COUNT WITH DIFFERENTIAL
Eos, Fluid: 0 %
Lymphs, Fluid: 14 %
Monocyte-Macrophage-Serous Fluid: 45 % — ABNORMAL LOW (ref 50–90)
Neutrophil Count, Fluid: 41 % — ABNORMAL HIGH (ref 0–25)
Total Nucleated Cell Count, Fluid: 153 cu mm (ref 0–1000)

## 2022-11-24 LAB — BASIC METABOLIC PANEL
Anion gap: 7 (ref 5–15)
BUN: 29 mg/dL — ABNORMAL HIGH (ref 8–23)
CO2: 40 mmol/L — ABNORMAL HIGH (ref 22–32)
Calcium: 8.1 mg/dL — ABNORMAL LOW (ref 8.9–10.3)
Chloride: 86 mmol/L — ABNORMAL LOW (ref 98–111)
Creatinine, Ser: 1.02 mg/dL — ABNORMAL HIGH (ref 0.44–1.00)
GFR, Estimated: 59 mL/min — ABNORMAL LOW (ref >60)
Glucose, Bld: 97 mg/dL (ref 70–99)
Potassium: 4.1 mmol/L (ref 3.5–5.1)
Sodium: 133 mmol/L — ABNORMAL LOW (ref 135–145)

## 2022-11-24 LAB — GRAM STAIN

## 2022-11-24 LAB — PROTEIN, PLEURAL OR PERITONEAL FLUID: Total protein, fluid: <3 g/dL

## 2022-11-24 LAB — AMYLASE: Amylase: 32 U/L (ref 28–100)

## 2022-11-24 NOTE — Progress Notes (Signed)
Pulmonary Critical Care Medicine [facility] [place]   PULMONARY CRITICAL CARE SERVICE  PROGRESS NOTE     [name]  Bridges  DOB: 1951/07/27   DOA: 09/27/2022  Referring Physician: Luna Kitchens, MD  HPI: [name] is a 71 y.o. female being Bridges for ventilator/airway/oxygen weaning Acute on Chronic Respiratory Failure.  She appears to be comfortable without any distress is on 4 L of oxygen.  CT of the chest showed multifocal pneumonia  Medications: Reviewed on Rounds  Physical Exam:  Vitals: Temperature is 97.4 pulse 61 respiratory 18 blood pressure 109/41 saturations 99%  Ventilator Settings currently off the ventilator on 4 L nasal cannula doing fairly well considering the findings of the CT  General: Comfortable at this time Neck: supple Cardiovascular: no malignant arrhythmias Respiratory: Scattered rhonchi expansion is equal Skin: no rash seen on limited exam Musculoskeletal: No gross abnormality Psychiatric:unable to assess Neurologic:no involuntary movements         Lab Data:   Basic Metabolic Panel: Recent Labs  Lab 11/19/22 0116 11/21/22 0144 11/23/22 0224 11/24/22 0309  NA 134* 132* 136 133*  K 4.8 4.8 4.2 4.1  CL 90* 90* 87* 86*  CO2 33* 36* 40* 40*  GLUCOSE 161* 122* 112* 97  BUN 32* 24* 25* 29*  CREATININE 0.85 0.71 0.84 1.02*  CALCIUM 9.0 8.5* 8.5* 8.1*  MG 2.0 1.9  --   --     ABG: No results for input(s): "PHART", "PCO2ART", "PO2ART", "HCO3", "O2SAT" in the last 168 hours.  Liver Function Tests: No results for input(s): "AST", "ALT", "ALKPHOS", "BILITOT", "PROT", "ALBUMIN" in the last 168 hours. No results for input(s): "LIPASE", "AMYLASE" in the last 168 hours. No results for input(s): "AMMONIA" in the last 168 hours.  CBC: Recent Labs  Lab 11/19/22 0116 11/21/22 0144 11/21/22 1211 11/23/22 0224 11/23/22 1150  WBC 9.5 7.9 9.1 5.7  --   HGB 8.6* 7.7* 8.7* 6.8* 7.9*  HCT 27.7* 23.6*  27.5* 21.1* 25.7*  MCV 102.6* 99.2 100.7* 100.0  --   PLT 143* 120* 115* 110*  --     Cardiac Enzymes: No results for input(s): "CKTOTAL", "CKMB", "CKMBINDEX", "TROPONINI" in the last 168 hours.  BNP (last 3 results) Recent Labs    11/21/22 1211  BNP 322.4*    ProBNP (last 3 results) No results for input(s): "PROBNP" in the last 8760 hours.  Radiological Exams: CT Angio Chest Pulmonary Embolism (PE) W or WO Contrast  Result Date: 11/23/2022 CLINICAL DATA:  Concern for pulmonary embolism. Short of breath and abdominal pain. EXAM: CT ANGIOGRAPHY CHEST CT ABDOMEN AND PELVIS WITH CONTRAST TECHNIQUE: Multidetector CT imaging of the chest was performed using the standard protocol during bolus administration of intravenous contrast. Multiplanar CT image reconstructions and MIPs were obtained to evaluate the vascular anatomy. Multidetector CT imaging of the abdomen and pelvis was performed using the standard protocol during bolus administration of intravenous contrast. RADIATION DOSE REDUCTION: This exam was performed according to the departmental dose-optimization program which includes automated exposure control, adjustment of the mA and/or kV according to patient size and/or use of iterative reconstruction technique. CONTRAST:  70mL OMNIPAQUE IOHEXOL 350 MG/ML SOLN COMPARISON:  CT 12/01/2017, 03/15/2017 FINDINGS: CTA CHEST FINDINGS Cardiovascular: No filling defects within the pulmonary arteries to suggest acute pulmonary embolism. Mediastinum/Nodes: Small mediastinal lymph nodes are not pathologic by size criteria. No pericardial effusion. Trachea esophagus normal. No axillary supraclavicular adenopathy. Lungs/Pleura: There is dense bibasilar consolidation and airspace opacities. Pulmonary involvement extends  to the posterior aspect of the upper lobes with patchy airspace densities. Bilateral pleural effusions. Musculoskeletal: No chest wall abnormality. No acute or significant osseous findings.  Review of the MIP images confirms the above findings. CT ABDOMEN and PELVIS FINDINGS Hepatobiliary: Liver has a nodular contour. Caudate lobe is enlarged. Postcholecystectomy. Pancreas: Pancreas is normal. No ductal dilatation. No pancreatic inflammation. Spleen: Normal spleen Adrenals/urinary tract: Adrenal glands and kidneys are normal. The ureters and bladder normal. Bladder collapsed. Suprapubic catheter in place Stomach/Bowel: Peg tube in stomach. Small bowel normal. Colon normal. No bowel obstruction or inflammation. Several diverticula sigmoid colon. Vascular/Lymphatic: Abdominal aorta is normal caliber. No periportal or retroperitoneal adenopathy. No pelvic adenopathy. Reproductive: Post hysterectomy.  Adnexa unremarkable Other: No free fluid. Musculoskeletal: No aggressive osseous lesion. Review of the MIP images confirms the above findings. IMPRESSION: Chest Impression: 1. Dense bilateral airspace disease in the upper lower lobes consistent with MULTIFOCAL PNEUMONIA. 2. Moderate RIGHT effusion.  Small LEFT effusion. 3. No evidence acute pulmonary embolism. Abdomen / Pelvis Impression: 1. No acute findings in the abdomen pelvis. 2. Morphologic changes in liver consistent cirrhosis. 3. No bowel inflammation or obstruction. 4. Suprapubic catheter and PEG tube appear appropriately position. Electronically Signed   By: Genevive Bi M.D.   On: 11/23/2022 14:21   CT ABDOMEN PELVIS W CONTRAST  Result Date: 11/23/2022 CLINICAL DATA:  Concern for pulmonary embolism. Short of breath and abdominal pain. EXAM: CT ANGIOGRAPHY CHEST CT ABDOMEN AND PELVIS WITH CONTRAST TECHNIQUE: Multidetector CT imaging of the chest was performed using the standard protocol during bolus administration of intravenous contrast. Multiplanar CT image reconstructions and MIPs were obtained to evaluate the vascular anatomy. Multidetector CT imaging of the abdomen and pelvis was performed using the standard protocol during bolus  administration of intravenous contrast. RADIATION DOSE REDUCTION: This exam was performed according to the departmental dose-optimization program which includes automated exposure control, adjustment of the mA and/or kV according to patient size and/or use of iterative reconstruction technique. CONTRAST:  70mL OMNIPAQUE IOHEXOL 350 MG/ML SOLN COMPARISON:  CT 12/01/2017, 03/15/2017 FINDINGS: CTA CHEST FINDINGS Cardiovascular: No filling defects within the pulmonary arteries to suggest acute pulmonary embolism. Mediastinum/Nodes: Small mediastinal lymph nodes are not pathologic by size criteria. No pericardial effusion. Trachea esophagus normal. No axillary supraclavicular adenopathy. Lungs/Pleura: There is dense bibasilar consolidation and airspace opacities. Pulmonary involvement extends to the posterior aspect of the upper lobes with patchy airspace densities. Bilateral pleural effusions. Musculoskeletal: No chest wall abnormality. No acute or significant osseous findings. Review of the MIP images confirms the above findings. CT ABDOMEN and PELVIS FINDINGS Hepatobiliary: Liver has a nodular contour. Caudate lobe is enlarged. Postcholecystectomy. Pancreas: Pancreas is normal. No ductal dilatation. No pancreatic inflammation. Spleen: Normal spleen Adrenals/urinary tract: Adrenal glands and kidneys are normal. The ureters and bladder normal. Bladder collapsed. Suprapubic catheter in place Stomach/Bowel: Peg tube in stomach. Small bowel normal. Colon normal. No bowel obstruction or inflammation. Several diverticula sigmoid colon. Vascular/Lymphatic: Abdominal aorta is normal caliber. No periportal or retroperitoneal adenopathy. No pelvic adenopathy. Reproductive: Post hysterectomy.  Adnexa unremarkable Other: No free fluid. Musculoskeletal: No aggressive osseous lesion. Review of the MIP images confirms the above findings. IMPRESSION: Chest Impression: 1. Dense bilateral airspace disease in the upper lower lobes  consistent with MULTIFOCAL PNEUMONIA. 2. Moderate RIGHT effusion.  Small LEFT effusion. 3. No evidence acute pulmonary embolism. Abdomen / Pelvis Impression: 1. No acute findings in the abdomen pelvis. 2. Morphologic changes in liver consistent cirrhosis. 3. No bowel inflammation or  obstruction. 4. Suprapubic catheter and PEG tube appear appropriately position. Electronically Signed   By: Genevive Bi M.D.   On: 11/23/2022 14:21   DG CHEST PORT 1 VIEW  Result Date: 11/23/2022 CLINICAL DATA:  Respiratory failure. EXAM: PORTABLE CHEST 1 VIEW COMPARISON:  Radiographs 11/21/2022 and 11/15/2022.  CT 03/15/2017. FINDINGS: 0900 hours. Stable cardiac enlargement. The previously demonstrated diffuse bilateral airspace opacities have partially cleared. There are probable small bilateral pleural effusions. No evidence of pneumothorax or acute osseous abnormality. Telemetry leads overlie the chest. IMPRESSION: Interval partial clearing of diffuse bilateral airspace opacities which may reflect edema or multilobar pneumonia. Probable small bilateral pleural effusions. Electronically Signed   By: Carey Bullocks M.D.   On: 11/23/2022 09:14    Assessment/Plan Active Problems:   Acute on chronic respiratory failure with hypoxia (HCC)   Multifocal pneumonia   Acute metabolic encephalopathy   COPD, severe (HCC)   Obstructive sleep apnea   Acute on chronic respiratory failure hypoxia patient has multifocal pneumonia noted on the CT of the chest plan is going to be to continue to monitor along closely. Multifocal pneumonia on antibiotics we will continue with supportive care Metabolic encephalopathy overall no change Severe COPD we will continue with medical management Obstructive sleep apnea patient is at baseline right now using her BiPAP at night   I have personally seen and evaluated the patient, evaluated laboratory and imaging results, formulated the assessment and plan and placed orders. The Patient  requires high complexity decision making with multiple systems involvement.  Rounds were done with the Respiratory Therapy Director and Staff therapists and discussed with nursing staff also.  Yevonne Pax, MD [facility] Pulmonary Critical Care Medicine Sleep Medicine

## 2022-11-24 NOTE — Procedures (Signed)
PROCEDURE SUMMARY:  Successful US guided right thoracentesis. Yielded 110 mL of clear gold fluid. Patient tolerated procedure well. No immediate complications. EBL = trace  Specimen was sent for labs.  Post procedure chest X-ray pending.  Ottavio Norem S Drew Lips PA-C 11/24/2022 3:05 PM

## 2022-11-25 DIAGNOSIS — J189 Pneumonia, unspecified organism: Secondary | ICD-10-CM | POA: Diagnosis not present

## 2022-11-25 DIAGNOSIS — J9621 Acute and chronic respiratory failure with hypoxia: Secondary | ICD-10-CM | POA: Diagnosis not present

## 2022-11-25 DIAGNOSIS — G9341 Metabolic encephalopathy: Secondary | ICD-10-CM | POA: Diagnosis not present

## 2022-11-25 DIAGNOSIS — J449 Chronic obstructive pulmonary disease, unspecified: Secondary | ICD-10-CM | POA: Diagnosis not present

## 2022-11-25 LAB — BASIC METABOLIC PANEL
Anion gap: 8 (ref 5–15)
BUN: 31 mg/dL — ABNORMAL HIGH (ref 8–23)
CO2: 37 mmol/L — ABNORMAL HIGH (ref 22–32)
Calcium: 8.1 mg/dL — ABNORMAL LOW (ref 8.9–10.3)
Chloride: 90 mmol/L — ABNORMAL LOW (ref 98–111)
Creatinine, Ser: 1.15 mg/dL — ABNORMAL HIGH (ref 0.44–1.00)
GFR, Estimated: 51 mL/min — ABNORMAL LOW (ref >60)
Glucose, Bld: 99 mg/dL (ref 70–99)
Potassium: 3.7 mmol/L (ref 3.5–5.1)
Sodium: 135 mmol/L (ref 135–145)

## 2022-11-25 LAB — CBC
HCT: 23.5 % — ABNORMAL LOW (ref 36.0–46.0)
Hemoglobin: 7.7 g/dL — ABNORMAL LOW (ref 12.0–15.0)
MCH: 32.2 pg (ref 26.0–34.0)
MCHC: 32.8 g/dL (ref 30.0–36.0)
MCV: 98.3 fL (ref 80.0–100.0)
Platelets: 98 10*3/uL — ABNORMAL LOW (ref 150–400)
RBC: 2.39 MIL/uL — ABNORMAL LOW (ref 3.87–5.11)
RDW: 15.5 % (ref 11.5–15.5)
WBC: 5.5 10*3/uL (ref 4.0–10.5)
nRBC: 0 % (ref 0.0–0.2)

## 2022-11-25 LAB — TRIGLYCERIDES, BODY FLUIDS: Triglycerides, Fluid: 16 mg/dL

## 2022-11-25 NOTE — Consult Note (Incomplete)
Infectious Disease Consultation   [name]  ZOX:[id number]  DOB: 1951-04-27  DOA: 09/27/2022  Requesting physician: Dr. Manson Passey  Reason for consultation: Antibiotic Recommendations   History of Present Illness: [name] is an 71 y.o. female   Review of Systems:  As per HPI otherwise 10 point review of systems negative.    Past Medical History: No past medical history on file.  Past Surgical History: *** The histories are not reviewed yet. Please review them in the "History" navigator section and refresh this SmartLink.   Allergies:  Not on File   Social History:  has no history on file for tobacco use, alcohol use, and drug use.   Family History: No family history on file.  Physical Exam: There were no vitals filed for this visit.  Constitutional: Appearance,  Alert and awake, oriented x3, not in any acute distress. Eyes: PERLA, EOMI, irises appear normal, anicteric sclera,  ENMT: external ears and nose appear normal, normal hearing or hard of hearing            Lips appears normal, oropharynx mucosa, tongue, posterior pharynx appear normal  Neck: neck appears normal, no masses, normal ROM, no thyromegaly, no JVD  CVS: S1-S2 clear, no murmur rubs or gallops, no LE edema, normal pedal pulses  Respiratory:  clear to auscultation bilaterally, no wheezing, rales or rhonchi. Respiratory effort normal. No accessory muscle use.  Abdomen: soft nontender, nondistended, normal bowel sounds, no hepatosplenomegaly, no hernias  Musculoskeletal: : no cyanosis, clubbing or edema noted bilaterally                      Neuro: Cranial nerves II-XII intact, strength, sensation, reflexes Psych: judgement and insight appear normal, stable mood and affect, mental status Skin: no rashes or lesions or ulcers, no induration or nodules   Data reviewed:  I have personally reviewed following labs and imaging studies Labs:  CBC: Recent Labs  Lab  11/19/22 0116 11/21/22 0144 11/21/22 1211 11/23/22 0224 11/23/22 1150 11/25/22 0321  WBC 9.5 7.9 9.1 5.7  --  5.5  HGB 8.6* 7.7* 8.7* 6.8* 7.9* 7.7*  HCT 27.7* 23.6* 27.5* 21.1* 25.7* 23.5*  MCV 102.6* 99.2 100.7* 100.0  --  98.3  PLT 143* 120* 115* 110*  --  98*    Basic Metabolic Panel: Recent Labs  Lab 11/19/22 0116 11/21/22 0144 11/23/22 0224 11/24/22 0309 11/25/22 0321  NA 134* 132* 136 133* 135  K 4.8 4.8 4.2 4.1 3.7  CL 90* 90* 87* 86* 90*  CO2 33* 36* 40* 40* 37*  GLUCOSE 161* 122* 112* 97 99  BUN 32* 24* 25* 29* 31*  CREATININE 0.85 0.71 0.84 1.02* 1.15*  CALCIUM 9.0 8.5* 8.5* 8.1* 8.1*  MG 2.0 1.9  --   --   --    GFR CrCl cannot be calculated (Unknown ideal weight.). Liver Function Tests: No results for input(s): "AST", "ALT", "ALKPHOS", "BILITOT", "PROT", "ALBUMIN" in the last 168 hours. Recent Labs  Lab 11/24/22 0306  AMYLASE 32   No results for input(s): "AMMONIA" in the last 168 hours. Coagulation profile No results for input(s): "INR", "PROTIME" in the last 168 hours.  Cardiac Enzymes: No results for input(s): "CKTOTAL", "CKMB", "CKMBINDEX", "TROPONINI" in the last 168 hours. BNP: Invalid input(s): "POCBNP" CBG: No results for input(s): "GLUCAP" in the last 168 hours. D-Dimer No results for input(s): "DDIMER" in the last 72 hours. Hgb A1c  No results for input(s): "HGBA1C" in the last 72 hours. Lipid Profile No results for input(s): "CHOL", "HDL", "LDLCALC", "TRIG", "CHOLHDL", "LDLDIRECT" in the last 72 hours. Thyroid function studies No results for input(s): "TSH", "T4TOTAL", "T3FREE", "THYROIDAB" in the last 72 hours.  Invalid input(s): "FREET3" Anemia work up No results for input(s): "VITAMINB12", "FOLATE", "FERRITIN", "TIBC", "IRON", "RETICCTPCT" in the last 72 hours. Urinalysis No results found for: "COLORURINE", "APPEARANCEUR", "LABSPEC", "PHURINE", "GLUCOSEU", "HGBUR", "BILIRUBINUR", "KETONESUR", "PROTEINUR", "UROBILINOGEN",  "NITRITE", "LEUKOCYTESUR"   Sepsis Labs Recent Labs  Lab 11/21/22 0144 11/21/22 1211 11/23/22 0224 11/25/22 0321  WBC 7.9 9.1 5.7 5.5   Microbiology Recent Results (from the past 240 hour(s))  Expectorated Sputum Assessment w Gram Stain, Rflx to Resp Cult     Status: None   Collection Time: 11/21/22  6:30 PM   Specimen: Expectorated Sputum  Result Value Ref Range Status   Specimen Description EXPECTORATED SPUTUM  Final   Special Requests NONE  Final   Sputum evaluation   Final    Sputum specimen not acceptable for testing.  Please recollect.    C/ RENEE B., RN 11/21/22 2120 A. LAFRANCE Performed at [facility], [address], [place], [place] [address]    Report Status 11/23/2022 FINAL  Final  Culture, body fluid w Gram Stain-bottle     Status: None (Preliminary result)   Collection Time: 11/24/22  2:57 PM   Specimen: Pleura  Result Value Ref Range Status   Specimen Description PLEURAL  Final   Special Requests NONE  Final   Culture   Final    NO GROWTH < 24 HOURS Performed at [facility], [address], [place], [place] [address]    Report Status PENDING  Incomplete  Gram stain     Status: None   Collection Time: 11/24/22  2:57 PM   Specimen: Pleura  Result Value Ref Range Status   Specimen Description PLEURAL  Final   Special Requests NONE  Final   Gram Stain   Final    FEW WBC PRESENT, PREDOMINANTLY MONONUCLEAR NO ORGANISMS SEEN Performed at [facility], [address] [place], [place] [address]    Report Status 11/24/2022 FINAL  Final       Inpatient Medications:   Scheduled Meds: Continuous Infusions:   Radiological Exams on Admission: DG Chest Port 1 View  Result Date: 11/24/2022 CLINICAL DATA:  Status post thoracentesis. EXAM: PORTABLE CHEST 1 VIEW COMPARISON:  Chest x-ray 11/23/2022 FINDINGS: Status post evacuation of a right pleural effusion. No postprocedural pneumothorax. Persistent bilateral pulmonary infiltrates.  IMPRESSION: Status post evacuation of right pleural effusion without postprocedural pneumothorax. Persistent bilateral pulmonary infiltrates. Electronically Signed   By: Rudie Meyer M.D.   On: 11/24/2022 15:55   IR THORACENTESIS ASP PLEURAL SPACE W/IMG GUIDE  Result Date: 11/24/2022 INDICATION: Acute on chronic respiratory failure with hypoxia secondary to multifocal pneumonia with right pleural effusion. Request for therapeutic and diagnostic thoracentesis. EXAM: ULTRASOUND GUIDED RIGHT THORACENTESIS MEDICATIONS: 1% lidocaine 20 mL COMPLICATIONS: None immediate. PROCEDURE: An ultrasound guided thoracentesis was thoroughly discussed with the patient and questions answered. The benefits, risks, alternatives and complications were also discussed. The patient understands and wishes to proceed with the procedure. Written consent was obtained. Ultrasound was performed to localize and mark an adequate pocket of fluid in the right chest. The area was then prepped and draped in the normal sterile fashion. 1% Lidocaine was used for local anesthesia. Under ultrasound guidance a 6 Fr Safe-T-Centesis catheter  was introduced. Thoracentesis was performed. The catheter was removed and a dressing applied. FINDINGS: A total of approximately 110 mL of clear gold fluid was removed. Samples were sent to the laboratory as requested by the clinical team. IMPRESSION: Successful ultrasound guided right thoracentesis yielding 110 mL of pleural fluid. Procedure performed by: Corrin Parker, PA-C Electronically Signed   By: Irish Lack M.D.   On: 11/24/2022 15:12    Impression/Recommendations Active Problems:   Acute on chronic respiratory failure with hypoxia (HCC)   Multifocal pneumonia   Acute metabolic encephalopathy   COPD, severe (HCC)   Obstructive sleep apnea   Thank you for this consultation.    Vonzella Nipple M.D. 11/25/2022, 8:03 PM

## 2022-11-26 DIAGNOSIS — J449 Chronic obstructive pulmonary disease, unspecified: Secondary | ICD-10-CM | POA: Diagnosis not present

## 2022-11-26 DIAGNOSIS — J9621 Acute and chronic respiratory failure with hypoxia: Secondary | ICD-10-CM | POA: Diagnosis not present

## 2022-11-26 DIAGNOSIS — G9341 Metabolic encephalopathy: Secondary | ICD-10-CM | POA: Diagnosis not present

## 2022-11-26 DIAGNOSIS — J189 Pneumonia, unspecified organism: Secondary | ICD-10-CM | POA: Diagnosis not present

## 2022-11-26 LAB — CYTOLOGY - NON PAP

## 2022-11-26 NOTE — Progress Notes (Cosign Needed)
Pulmonary Critical Care Medicine [facility] [place]   PULMONARY CRITICAL CARE SERVICE  PROGRESS NOTE     [name]  ZOX:[id number]  DOB: 1951-07-14   DOA: 09/27/2022  Referring Physician: Luna Kitchens, MD  HPI: [name] is a 71 y.o. female being followed for ventilator/airway/oxygen weaning Acute on Chronic Respiratory Failure.  Patient seen sitting up in bed, currently remains on 4 L nasal cannula.  Continues to do well with BiPAP during the nocturnal hours.  Medications: Reviewed on Rounds  Physical Exam:  Vitals: Temp 97.3, pulse 58, respirations 15, BP 110/58, SpO2 99%  Ventilator Settings 4 L nasal cannula  General: Comfortable at this time Neck: supple Cardiovascular: no malignant arrhythmias Respiratory: Bilaterally diminished Skin: no rash seen on limited exam Musculoskeletal: No gross abnormality Psychiatric:unable to assess Neurologic:no involuntary movements         Lab Data:   Basic Metabolic Panel: Recent Labs  Lab 11/21/22 0144 11/23/22 0224 11/24/22 0309 11/25/22 0321  NA 132* 136 133* 135  K 4.8 4.2 4.1 3.7  CL 90* 87* 86* 90*  CO2 36* 40* 40* 37*  GLUCOSE 122* 112* 97 99  BUN 24* 25* 29* 31*  CREATININE 0.71 0.84 1.02* 1.15*  CALCIUM 8.5* 8.5* 8.1* 8.1*  MG 1.9  --   --   --     ABG: No results for input(s): "PHART", "PCO2ART", "PO2ART", "HCO3", "O2SAT" in the last 168 hours.  Liver Function Tests: No results for input(s): "AST", "ALT", "ALKPHOS", "BILITOT", "PROT", "ALBUMIN" in the last 168 hours. Recent Labs  Lab 11/24/22 0306  AMYLASE 32   No results for input(s): "AMMONIA" in the last 168 hours.  CBC: Recent Labs  Lab 11/21/22 0144 11/21/22 1211 11/23/22 0224 11/23/22 1150 11/25/22 0321  WBC 7.9 9.1 5.7  --  5.5  HGB 7.7* 8.7* 6.8* 7.9* 7.7*  HCT 23.6* 27.5* 21.1* 25.7* 23.5*  MCV 99.2 100.7* 100.0  --  98.3  PLT 120* 115* 110*  --  98*    Cardiac Enzymes: No results for  input(s): "CKTOTAL", "CKMB", "CKMBINDEX", "TROPONINI" in the last 168 hours.  BNP (last 3 results) Recent Labs    11/21/22 1211  BNP 322.4*    ProBNP (last 3 results) No results for input(s): "PROBNP" in the last 8760 hours.  Radiological Exams: DG Chest Port 1 View  Result Date: 11/24/2022 CLINICAL DATA:  Status post thoracentesis. EXAM: PORTABLE CHEST 1 VIEW COMPARISON:  Chest x-ray 11/23/2022 FINDINGS: Status post evacuation of a right pleural effusion. No postprocedural pneumothorax. Persistent bilateral pulmonary infiltrates. IMPRESSION: Status post evacuation of right pleural effusion without postprocedural pneumothorax. Persistent bilateral pulmonary infiltrates. Electronically Signed   By: Rudie Meyer M.D.   On: 11/24/2022 15:55   IR THORACENTESIS ASP PLEURAL SPACE W/IMG GUIDE  Result Date: 11/24/2022 INDICATION: Acute on chronic respiratory failure with hypoxia secondary to multifocal pneumonia with right pleural effusion. Request for therapeutic and diagnostic thoracentesis. EXAM: ULTRASOUND GUIDED RIGHT THORACENTESIS MEDICATIONS: 1% lidocaine 20 mL COMPLICATIONS: None immediate. PROCEDURE: An ultrasound guided thoracentesis was thoroughly discussed with the patient and questions answered. The benefits, risks, alternatives and complications were also discussed. The patient understands and wishes to proceed with the procedure. Written consent was obtained. Ultrasound was performed to localize and mark an adequate pocket of fluid in the right chest. The area was then prepped and draped in the normal sterile fashion. 1% Lidocaine was used for local anesthesia. Under ultrasound guidance a 6 Fr Safe-T-Centesis catheter was introduced. Thoracentesis  was performed. The catheter was removed and a dressing applied. FINDINGS: A total of approximately 110 mL of clear gold fluid was removed. Samples were sent to the laboratory as requested by the clinical team. IMPRESSION: Successful ultrasound  guided right thoracentesis yielding 110 mL of pleural fluid. Procedure performed by: Corrin Parker, PA-C Electronically Signed   By: Irish Lack M.D.   On: 11/24/2022 15:12    Assessment/Plan Active Problems:   Acute on chronic respiratory failure with hypoxia (HCC)   Multifocal pneumonia   Acute metabolic encephalopathy   COPD, severe (HCC)   Obstructive sleep apnea   Acute on chronic respiratory failure hypoxia patient has multifocal pneumonia noted on the CT of the chest plan is going to be to continue to monitor along closely. Multifocal pneumonia on antibiotics we will continue with supportive care Metabolic encephalopathy overall no change Severe COPD we will continue with medical management Obstructive sleep apnea patient is at baseline right now using her BiPAP at night   I have personally seen and evaluated the patient, evaluated laboratory and imaging results, formulated the assessment and plan and placed orders. The Patient requires high complexity decision making with multiple systems involvement.  Rounds were done with the Respiratory Therapy Director and Staff therapists and discussed with nursing staff also.  Yevonne Pax, MD [facility] Pulmonary Critical Care Medicine Sleep Medicine

## 2022-11-26 NOTE — Progress Notes (Cosign Needed)
Pulmonary Critical Care Medicine [facility] [place]   PULMONARY CRITICAL CARE SERVICE  PROGRESS NOTE     Jesalynn Everette Bridges  [id number]:[id number]  DOB: [birth year]   DOA: 09/27/2022  Referring Physician: Luna Kitchens, MD  HPI: [name] is a 71 y.o. female being followed for ventilator/airway/oxygen weaning Acute on Chronic Respiratory Failure.  Patient seen lying in bed, currently remains on 4 L nasal cannula without issue.  Continues to do well nocturnal hours with BiPAP.  Medications: Reviewed on Rounds  Physical Exam:  Vitals: Temp 97.1, pulse 87, respirations 17, BP 111/60, SpO2 98%  Ventilator Settings 4 L nasal cannula  General: Comfortable at this time Neck: supple Cardiovascular: no malignant arrhythmias Respiratory: Bilaterally diminished Skin: no rash seen on limited exam Musculoskeletal: No gross abnormality Psychiatric:unable to assess Neurologic:no involuntary movements         Lab Data:   Basic Metabolic Panel: Recent Labs  Lab 11/21/22 0144 11/23/22 0224 11/24/22 0309 11/25/22 0321  NA 132* 136 133* 135  K 4.8 4.2 4.1 3.7  CL 90* 87* 86* 90*  CO2 36* 40* 40* 37*  GLUCOSE 122* 112* 97 99  BUN 24* 25* 29* 31*  CREATININE 0.71 0.84 1.02* 1.15*  CALCIUM 8.5* 8.5* 8.1* 8.1*  MG 1.9  --   --   --     ABG: No results for input(s): "PHART", "PCO2ART", "PO2ART", "HCO3", "O2SAT" in the last 168 hours.  Liver Function Tests: No results for input(s): "AST", "ALT", "ALKPHOS", "BILITOT", "PROT", "ALBUMIN" in the last 168 hours. Recent Labs  Lab 11/24/22 0306  AMYLASE 32   No results for input(s): "AMMONIA" in the last 168 hours.  CBC: Recent Labs  Lab 11/21/22 0144 11/21/22 1211 11/23/22 0224 11/23/22 1150 11/25/22 0321  WBC 7.9 9.1 5.7  --  5.5  HGB 7.7* 8.7* 6.8* 7.9* 7.7*  HCT 23.6* 27.5* 21.1* 25.7* 23.5*  MCV 99.2 100.7* 100.0  --  98.3  PLT 120* 115* 110*  --  98*    Cardiac Enzymes: No results for  input(s): "CKTOTAL", "CKMB", "CKMBINDEX", "TROPONINI" in the last 168 hours.  BNP (last 3 results) Recent Labs    11/21/22 1211  BNP 322.4*    ProBNP (last 3 results) No results for input(s): "PROBNP" in the last 8760 hours.  Radiological Exams: DG Chest Port 1 View  Result Date: 11/24/2022 CLINICAL DATA:  Status post thoracentesis. EXAM: PORTABLE CHEST 1 VIEW COMPARISON:  Chest x-ray 11/23/2022 FINDINGS: Status post evacuation of a right pleural effusion. No postprocedural pneumothorax. Persistent bilateral pulmonary infiltrates. IMPRESSION: Status post evacuation of right pleural effusion without postprocedural pneumothorax. Persistent bilateral pulmonary infiltrates. Electronically Signed   By: Rudie Meyer M.D.   On: 11/24/2022 15:55   IR THORACENTESIS ASP PLEURAL SPACE W/IMG GUIDE  Result Date: 11/24/2022 INDICATION: Acute on chronic respiratory failure with hypoxia secondary to multifocal pneumonia with right pleural effusion. Request for therapeutic and diagnostic thoracentesis. EXAM: ULTRASOUND GUIDED RIGHT THORACENTESIS MEDICATIONS: 1% lidocaine 20 mL COMPLICATIONS: None immediate. PROCEDURE: An ultrasound guided thoracentesis was thoroughly discussed with the patient and questions answered. The benefits, risks, alternatives and complications were also discussed. The patient understands and wishes to proceed with the procedure. Written consent was obtained. Ultrasound was performed to localize and mark an adequate pocket of fluid in the right chest. The area was then prepped and draped in the normal sterile fashion. 1% Lidocaine was used for local anesthesia. Under ultrasound guidance a 6 Fr Safe-T-Centesis catheter was introduced. Thoracentesis was  performed. The catheter was removed and a dressing applied. FINDINGS: A total of approximately 110 mL of clear gold fluid was removed. Samples were sent to the laboratory as requested by the clinical team. IMPRESSION: Successful ultrasound  guided right thoracentesis yielding 110 mL of pleural fluid. Procedure performed by: Corrin Parker, PA-C Electronically Signed   By: Irish Lack M.D.   On: 11/24/2022 15:12    Assessment/Plan Active Problems:   Acute on chronic respiratory failure with hypoxia (HCC)   Multifocal pneumonia   Acute metabolic encephalopathy   COPD, severe (HCC)   Obstructive sleep apnea   Acute on chronic respiratory failure hypoxia patient has multifocal pneumonia noted on the CT of the chest plan is going to be to continue to monitor along closely. Multifocal pneumonia on antibiotics we will continue with supportive care Metabolic encephalopathy overall no change Severe COPD we will continue with medical management Obstructive sleep apnea patient is at baseline right now using her BiPAP at night   I have personally seen and evaluated the patient, evaluated laboratory and imaging results, formulated the assessment and plan and placed orders. The Patient requires high complexity decision making with multiple systems involvement.  Rounds were done with the Respiratory Therapy Director and Staff therapists and discussed with nursing staff also.  Yevonne Pax, MD [facility] Pulmonary Critical Care Medicine Sleep Medicine

## 2022-11-27 DIAGNOSIS — G9341 Metabolic encephalopathy: Secondary | ICD-10-CM | POA: Diagnosis not present

## 2022-11-27 DIAGNOSIS — J9621 Acute and chronic respiratory failure with hypoxia: Secondary | ICD-10-CM | POA: Diagnosis not present

## 2022-11-27 DIAGNOSIS — J189 Pneumonia, unspecified organism: Secondary | ICD-10-CM | POA: Diagnosis not present

## 2022-11-27 DIAGNOSIS — J449 Chronic obstructive pulmonary disease, unspecified: Secondary | ICD-10-CM | POA: Diagnosis not present

## 2022-11-27 LAB — BASIC METABOLIC PANEL
Anion gap: 7 (ref 5–15)
BUN: 32 mg/dL — ABNORMAL HIGH (ref 8–23)
CO2: 37 mmol/L — ABNORMAL HIGH (ref 22–32)
Calcium: 8.4 mg/dL — ABNORMAL LOW (ref 8.9–10.3)
Chloride: 90 mmol/L — ABNORMAL LOW (ref 98–111)
Creatinine, Ser: 1.03 mg/dL — ABNORMAL HIGH (ref 0.44–1.00)
GFR, Estimated: 58 mL/min — ABNORMAL LOW (ref >60)
Glucose, Bld: 108 mg/dL — ABNORMAL HIGH (ref 70–99)
Potassium: 3.5 mmol/L (ref 3.5–5.1)
Sodium: 134 mmol/L — ABNORMAL LOW (ref 135–145)

## 2022-11-27 LAB — CBC
HCT: 24.2 % — ABNORMAL LOW (ref 36.0–46.0)
Hemoglobin: 7.9 g/dL — ABNORMAL LOW (ref 12.0–15.0)
MCH: 32.2 pg (ref 26.0–34.0)
MCHC: 32.6 g/dL (ref 30.0–36.0)
MCV: 98.8 fL (ref 80.0–100.0)
Platelets: 106 10*3/uL — ABNORMAL LOW (ref 150–400)
RBC: 2.45 MIL/uL — ABNORMAL LOW (ref 3.87–5.11)
RDW: 15.2 % (ref 11.5–15.5)
WBC: 6.3 10*3/uL (ref 4.0–10.5)
nRBC: 0 % (ref 0.0–0.2)

## 2022-11-27 LAB — MAGNESIUM
Magnesium: 1.7 mg/dL (ref 1.7–2.4)
Magnesium: 1.8 mg/dL (ref 1.7–2.4)

## 2022-11-28 DIAGNOSIS — J189 Pneumonia, unspecified organism: Secondary | ICD-10-CM | POA: Diagnosis not present

## 2022-11-28 DIAGNOSIS — J449 Chronic obstructive pulmonary disease, unspecified: Secondary | ICD-10-CM | POA: Diagnosis not present

## 2022-11-28 DIAGNOSIS — G9341 Metabolic encephalopathy: Secondary | ICD-10-CM | POA: Diagnosis not present

## 2022-11-28 DIAGNOSIS — J9621 Acute and chronic respiratory failure with hypoxia: Secondary | ICD-10-CM | POA: Diagnosis not present

## 2022-11-28 NOTE — Progress Notes (Signed)
Pulmonary Critical Care Medicine [facility] [place]   PULMONARY CRITICAL CARE SERVICE  PROGRESS NOTE     [name]  [id number]  DOB: 10/08/1951   DOA: 09/27/2022  Referring Physician: Luna Kitchens, MD  HPI: [name] is a 71 y.o. female being followed for ventilator/airway/oxygen weaning Acute on Chronic Respiratory Failure.  Patient is currently on nasal cannula on 3 L  Medications: Reviewed on Rounds  Physical Exam:  Vitals: Temperature is 97.1 pulse 66 respiratory 19 blood pressure 115/70 saturation is 98%  Ventilator Settings off the ventilator on nasal cannula  General: Comfortable at this time Neck: supple Cardiovascular: no malignant arrhythmias Respiratory: Scattered rhonchi expansion is equal Skin: no rash seen on limited exam Musculoskeletal: No gross abnormality Psychiatric:unable to assess Neurologic:no involuntary movements         Lab Data:   Basic Metabolic Panel: Recent Labs  Lab 11/23/22 0224 11/24/22 0309 11/25/22 0321 11/27/22 0153 11/27/22 2054  NA 136 133* 135 134*  --   K 4.2 4.1 3.7 3.5  --   CL 87* 86* 90* 90*  --   CO2 40* 40* 37* 37*  --   GLUCOSE 112* 97 99 108*  --   BUN 25* 29* 31* 32*  --   CREATININE 0.84 1.02* 1.15* 1.03*  --   CALCIUM 8.5* 8.1* 8.1* 8.4*  --   MG  --   --   --  1.7 1.8    ABG: No results for input(s): "PHART", "PCO2ART", "PO2ART", "HCO3", "O2SAT" in the last 168 hours.  Liver Function Tests: No results for input(s): "AST", "ALT", "ALKPHOS", "BILITOT", "PROT", "ALBUMIN" in the last 168 hours. Recent Labs  Lab 11/24/22 0306  AMYLASE 32   No results for input(s): "AMMONIA" in the last 168 hours.  CBC: Recent Labs  Lab 11/21/22 1211 11/23/22 0224 11/23/22 1150 11/25/22 0321 11/27/22 0153  WBC 9.1 5.7  --  5.5 6.3  HGB 8.7* 6.8* 7.9* 7.7* 7.9*  HCT 27.5* 21.1* 25.7* 23.5* 24.2*  MCV 100.7* 100.0  --  98.3 98.8  PLT 115* 110*  --  98* 106*    Cardiac  Enzymes: No results for input(s): "CKTOTAL", "CKMB", "CKMBINDEX", "TROPONINI" in the last 168 hours.  BNP (last 3 results) Recent Labs    11/21/22 1211  BNP 322.4*    ProBNP (last 3 results) No results for input(s): "PROBNP" in the last 8760 hours.  Radiological Exams: No results found.  Assessment/Plan Active Problems:   Acute on chronic respiratory failure with hypoxia (HCC)   Multifocal pneumonia   Acute metabolic encephalopathy   COPD, severe (HCC)   Obstructive sleep apnea   Acute on chronic respiratory failure hypoxia will continue with oxygen therapy use PAP therapy at nighttime Multifocal pneumonia noted changes we will continue to follow along closely Metabolic encephalopathy at baseline Severe COPD medical management Obstructive sleep apnea at baseline   I have personally seen and evaluated the patient, evaluated laboratory and imaging results, formulated the assessment and plan and placed orders. The Patient requires high complexity decision making with multiple systems involvement.  Rounds were done with the Respiratory Therapy Director and Staff therapists and discussed with nursing staff also.  Yevonne Pax, MD [facility] Pulmonary Critical Care Medicine Sleep Medicine

## 2022-11-29 DIAGNOSIS — J449 Chronic obstructive pulmonary disease, unspecified: Secondary | ICD-10-CM | POA: Diagnosis not present

## 2022-11-29 DIAGNOSIS — J189 Pneumonia, unspecified organism: Secondary | ICD-10-CM | POA: Diagnosis not present

## 2022-11-29 DIAGNOSIS — G9341 Metabolic encephalopathy: Secondary | ICD-10-CM | POA: Diagnosis not present

## 2022-11-29 DIAGNOSIS — J9621 Acute and chronic respiratory failure with hypoxia: Secondary | ICD-10-CM | POA: Diagnosis not present

## 2022-11-29 LAB — CULTURE, BODY FLUID W GRAM STAIN -BOTTLE: Culture: NO GROWTH

## 2022-11-30 DIAGNOSIS — J449 Chronic obstructive pulmonary disease, unspecified: Secondary | ICD-10-CM | POA: Diagnosis not present

## 2022-11-30 DIAGNOSIS — G9341 Metabolic encephalopathy: Secondary | ICD-10-CM | POA: Diagnosis not present

## 2022-11-30 DIAGNOSIS — J9621 Acute and chronic respiratory failure with hypoxia: Secondary | ICD-10-CM | POA: Diagnosis not present

## 2022-11-30 DIAGNOSIS — J189 Pneumonia, unspecified organism: Secondary | ICD-10-CM | POA: Diagnosis not present

## 2022-12-01 DIAGNOSIS — J9621 Acute and chronic respiratory failure with hypoxia: Secondary | ICD-10-CM | POA: Diagnosis not present

## 2022-12-01 DIAGNOSIS — G4733 Obstructive sleep apnea (adult) (pediatric): Secondary | ICD-10-CM

## 2022-12-01 DIAGNOSIS — G9341 Metabolic encephalopathy: Secondary | ICD-10-CM | POA: Diagnosis not present

## 2022-12-01 DIAGNOSIS — J189 Pneumonia, unspecified organism: Secondary | ICD-10-CM | POA: Diagnosis not present

## 2022-12-01 DIAGNOSIS — J449 Chronic obstructive pulmonary disease, unspecified: Secondary | ICD-10-CM | POA: Diagnosis not present

## 2022-12-01 LAB — CBC WITH DIFFERENTIAL/PLATELET
Abs Immature Granulocytes: 0.03 10*3/uL (ref 0.00–0.07)
Basophils Absolute: 0 10*3/uL (ref 0.0–0.1)
Basophils Relative: 0 %
Eosinophils Absolute: 0.1 10*3/uL (ref 0.0–0.5)
Eosinophils Relative: 1 %
HCT: 26.1 % — ABNORMAL LOW (ref 36.0–46.0)
Hemoglobin: 8.6 g/dL — ABNORMAL LOW (ref 12.0–15.0)
Immature Granulocytes: 1 %
Lymphocytes Relative: 12 %
Lymphs Abs: 0.8 10*3/uL (ref 0.7–4.0)
MCH: 32.7 pg (ref 26.0–34.0)
MCHC: 33 g/dL (ref 30.0–36.0)
MCV: 99.2 fL (ref 80.0–100.0)
Monocytes Absolute: 0.3 10*3/uL (ref 0.1–1.0)
Monocytes Relative: 4 %
Neutro Abs: 5.1 10*3/uL (ref 1.7–7.7)
Neutrophils Relative %: 82 %
Platelets: 143 10*3/uL — ABNORMAL LOW (ref 150–400)
RBC: 2.63 MIL/uL — ABNORMAL LOW (ref 3.87–5.11)
RDW: 15.5 % (ref 11.5–15.5)
WBC: 6.2 10*3/uL (ref 4.0–10.5)
nRBC: 0 % (ref 0.0–0.2)

## 2022-12-01 LAB — MISC LABCORP TEST (SEND OUT): Labcorp test code: 9985

## 2022-12-01 LAB — COMPREHENSIVE METABOLIC PANEL
ALT: 21 U/L (ref 0–44)
AST: 18 U/L (ref 15–41)
Albumin: 2.4 g/dL — ABNORMAL LOW (ref 3.5–5.0)
Alkaline Phosphatase: 49 U/L (ref 38–126)
Anion gap: 7 (ref 5–15)
BUN: 27 mg/dL — ABNORMAL HIGH (ref 8–23)
CO2: 37 mmol/L — ABNORMAL HIGH (ref 22–32)
Calcium: 8.7 mg/dL — ABNORMAL LOW (ref 8.9–10.3)
Chloride: 92 mmol/L — ABNORMAL LOW (ref 98–111)
Creatinine, Ser: 0.78 mg/dL (ref 0.44–1.00)
GFR, Estimated: >60 mL/min (ref >60)
Glucose, Bld: 110 mg/dL — ABNORMAL HIGH (ref 70–99)
Potassium: 3.7 mmol/L (ref 3.5–5.1)
Sodium: 136 mmol/L (ref 135–145)
Total Bilirubin: 0.8 mg/dL (ref 0.3–1.2)
Total Protein: 5.7 g/dL — ABNORMAL LOW (ref 6.5–8.1)

## 2022-12-01 NOTE — Progress Notes (Cosign Needed)
Pulmonary Critical Care Medicine [facility] [place]   PULMONARY CRITICAL CARE SERVICE  PROGRESS NOTE     [name]  [id number]  DOB: 1951/08/12   DOA: 09/27/2022  Referring Physician: Luna Kitchens, MD  HPI: [name] is a 71 y.o. female being followed for ventilator/airway/oxygen weaning Acute on Chronic Respiratory Failure.  Patient seen lying in bed, currently remains on 2 L nasal cannula without issue.  Continues to use BiPAP at night.  Medications: Reviewed on Rounds  Physical Exam:  Vitals: Temp 97.6, pulse 61, respirations 12, BP 125/54, SpO2 98%  Ventilator Settings 2 L nasal cannula  General: Comfortable at this time Neck: supple Cardiovascular: no malignant arrhythmias Respiratory: Bilaterally diminished Skin: no rash seen on limited exam Musculoskeletal: No gross abnormality Psychiatric:unable to assess Neurologic:no involuntary movements         Lab Data:   Basic Metabolic Panel: Recent Labs  Lab 11/25/22 0321 11/27/22 0153 11/27/22 2054 12/01/22 0149  NA 135 134*  --  136  K 3.7 3.5  --  3.7  CL 90* 90*  --  92*  CO2 37* 37*  --  37*  GLUCOSE 99 108*  --  110*  BUN 31* 32*  --  27*  CREATININE 1.15* 1.03*  --  0.78  CALCIUM 8.1* 8.4*  --  8.7*  MG  --  1.7 1.8  --     ABG: No results for input(s): "PHART", "PCO2ART", "PO2ART", "HCO3", "O2SAT" in the last 168 hours.  Liver Function Tests: Recent Labs  Lab 12/01/22 0149  AST 18  ALT 21  ALKPHOS 49  BILITOT 0.8  PROT 5.7*  ALBUMIN 2.4*   No results for input(s): "LIPASE", "AMYLASE" in the last 168 hours. No results for input(s): "AMMONIA" in the last 168 hours.  CBC: Recent Labs  Lab 11/25/22 0321 11/27/22 0153 12/01/22 0149  WBC 5.5 6.3 6.2  NEUTROABS  --   --  5.1  HGB 7.7* 7.9* 8.6*  HCT 23.5* 24.2* 26.1*  MCV 98.3 98.8 99.2  PLT 98* 106* 143*    Cardiac Enzymes: No results for input(s): "CKTOTAL", "CKMB", "CKMBINDEX",  "TROPONINI" in the last 168 hours.  BNP (last 3 results) Recent Labs    11/21/22 1211  BNP 322.4*    ProBNP (last 3 results) No results for input(s): "PROBNP" in the last 8760 hours.  Radiological Exams: No results found.  Assessment/Plan Active Problems:   Acute on chronic respiratory failure with hypoxia (HCC)   Multifocal pneumonia   Acute metabolic encephalopathy   COPD, severe (HCC)   Obstructive sleep apnea  Acute on chronic respiratory failure hypoxia-remains on 2 L nasal cannula during the day with the use of BiPAP at night. Multifocal pneumonia on antibiotics we will continue with supportive care Metabolic encephalopathy overall no change Severe COPD we will continue with medical management Obstructive sleep apnea patient is at baseline right now using her BiPAP at night  I have personally seen and evaluated the patient, evaluated laboratory and imaging results, formulated the assessment and plan and placed orders. The Patient requires high complexity decision making with multiple systems involvement.  Rounds were done with the Respiratory Therapy Director and Staff therapists and discussed with nursing staff also.  Yevonne Pax, MD [facility] Pulmonary Critical Care Medicine Sleep Medicine

## 2022-12-01 NOTE — Progress Notes (Cosign Needed)
Pulmonary Critical Care Medicine [facility] [place]   PULMONARY CRITICAL CARE SERVICE  PROGRESS NOTE     [name]  [id number]  DOB: Feb 13, 1951   DOA: 09/27/2022  Referring Physician: Luna Kitchens, MD  HPI: [name] is a 71 y.o. female being followed for ventilator/airway/oxygen weaning Acute on Chronic Respiratory Failure.  Patient seen sitting up in bed, currently remains on 2 L nasal cannula.  Continues to do well with BiPAP use overnight.  Medications: Reviewed on Rounds  Physical Exam:  Vitals: Temp 97.9, pulse 70, respirations 27, BP 120/51, SpO2 96%  Ventilator Settings 2 L nasal cannula  General: Comfortable at this time Neck: supple Cardiovascular: no malignant arrhythmias Respiratory: Bilaterally diminished Skin: no rash seen on limited exam Musculoskeletal: No gross abnormality Psychiatric:unable to assess Neurologic:no involuntary movements         Lab Data:   Basic Metabolic Panel: Recent Labs  Lab 11/25/22 0321 11/27/22 0153 11/27/22 2054 12/01/22 0149  NA 135 134*  --  136  K 3.7 3.5  --  3.7  CL 90* 90*  --  92*  CO2 37* 37*  --  37*  GLUCOSE 99 108*  --  110*  BUN 31* 32*  --  27*  CREATININE 1.15* 1.03*  --  0.78  CALCIUM 8.1* 8.4*  --  8.7*  MG  --  1.7 1.8  --     ABG: No results for input(s): "PHART", "PCO2ART", "PO2ART", "HCO3", "O2SAT" in the last 168 hours.  Liver Function Tests: Recent Labs  Lab 12/01/22 0149  AST 18  ALT 21  ALKPHOS 49  BILITOT 0.8  PROT 5.7*  ALBUMIN 2.4*   No results for input(s): "LIPASE", "AMYLASE" in the last 168 hours. No results for input(s): "AMMONIA" in the last 168 hours.  CBC: Recent Labs  Lab 11/25/22 0321 11/27/22 0153 12/01/22 0149  WBC 5.5 6.3 6.2  NEUTROABS  --   --  5.1  HGB 7.7* 7.9* 8.6*  HCT 23.5* 24.2* 26.1*  MCV 98.3 98.8 99.2  PLT 98* 106* 143*    Cardiac Enzymes: No results for input(s): "CKTOTAL", "CKMB", "CKMBINDEX",  "TROPONINI" in the last 168 hours.  BNP (last 3 results) Recent Labs    11/21/22 1211  BNP 322.4*    ProBNP (last 3 results) No results for input(s): "PROBNP" in the last 8760 hours.  Radiological Exams: No results found.  Assessment/Plan Active Problems:   Acute on chronic respiratory failure with hypoxia (HCC)   Multifocal pneumonia   Acute metabolic encephalopathy   COPD, severe (HCC)   Obstructive sleep apnea   Acute on chronic respiratory failure hypoxia-remains on 2 L nasal cannula during the day with the use of BiPAP at night. Multifocal pneumonia on antibiotics we will continue with supportive care Metabolic encephalopathy overall no change Severe COPD we will continue with medical management Obstructive sleep apnea patient is at baseline right now using her BiPAP at night   I have personally seen and evaluated the patient, evaluated laboratory and imaging results, formulated the assessment and plan and placed orders. The Patient requires high complexity decision making with multiple systems involvement.  Rounds were done with the Respiratory Therapy Director and Staff therapists and discussed with nursing staff also.  Yevonne Pax, MD [facility] Pulmonary Critical Care Medicine Sleep Medicine

## 2022-12-01 NOTE — Progress Notes (Cosign Needed)
Pulmonary Critical Care Medicine [facility] [place]   PULMONARY CRITICAL CARE SERVICE  PROGRESS NOTE     [name]  [id number]  DOB: 03/28/1951   DOA: 09/27/2022  Referring Physician: Luna Kitchens, MD  HPI: [name] is a 71 y.o. female being followed for ventilator/airway/oxygen weaning Acute on Chronic Respiratory Failure.  Patient seen lying in bed, currently remains on 2 L nasal cannula during the day and doing well with BiPAP at night.  According to primary team patient is scheduled to DC this facility today.  Medications: Reviewed on Rounds  Physical Exam:  Vitals: Temp 97.5, pulse 89, respirations 36, BP 135/63, SpO2 98%  Ventilator Settings 2 L nasal cannula  General: Comfortable at this time Neck: supple Cardiovascular: no malignant arrhythmias Respiratory: Bilaterally diminished Skin: no rash seen on limited exam Musculoskeletal: No gross abnormality Psychiatric:unable to assess Neurologic:no involuntary movements         Lab Data:   Basic Metabolic Panel: Recent Labs  Lab 11/25/22 0321 11/27/22 0153 11/27/22 2054 12/01/22 0149  NA 135 134*  --  136  K 3.7 3.5  --  3.7  CL 90* 90*  --  92*  CO2 37* 37*  --  37*  GLUCOSE 99 108*  --  110*  BUN 31* 32*  --  27*  CREATININE 1.15* 1.03*  --  0.78  CALCIUM 8.1* 8.4*  --  8.7*  MG  --  1.7 1.8  --     ABG: No results for input(s): "PHART", "PCO2ART", "PO2ART", "HCO3", "O2SAT" in the last 168 hours.  Liver Function Tests: Recent Labs  Lab 12/01/22 0149  AST 18  ALT 21  ALKPHOS 49  BILITOT 0.8  PROT 5.7*  ALBUMIN 2.4*   No results for input(s): "LIPASE", "AMYLASE" in the last 168 hours. No results for input(s): "AMMONIA" in the last 168 hours.  CBC: Recent Labs  Lab 11/25/22 0321 11/27/22 0153 12/01/22 0149  WBC 5.5 6.3 6.2  NEUTROABS  --   --  5.1  HGB 7.7* 7.9* 8.6*  HCT 23.5* 24.2* 26.1*  MCV 98.3 98.8 99.2  PLT 98* 106* 143*    Cardiac  Enzymes: No results for input(s): "CKTOTAL", "CKMB", "CKMBINDEX", "TROPONINI" in the last 168 hours.  BNP (last 3 results) Recent Labs    11/21/22 1211  BNP 322.4*    ProBNP (last 3 results) No results for input(s): "PROBNP" in the last 8760 hours.  Radiological Exams: No results found.  Assessment/Plan Active Problems:   Acute on chronic respiratory failure with hypoxia (HCC)   Multifocal pneumonia   Acute metabolic encephalopathy   COPD, severe (HCC)   Obstructive sleep apnea   Acute on chronic respiratory failure hypoxia-remains on 2 L nasal cannula during the day with the use of BiPAP at night. Multifocal pneumonia on antibiotics we will continue with supportive care Metabolic encephalopathy overall no change Severe COPD we will continue with medical management Obstructive sleep apnea patient is at baseline right now using her BiPAP at night   I have personally seen and evaluated the patient, evaluated laboratory and imaging results, formulated the assessment and plan and placed orders. The Patient requires high complexity decision making with multiple systems involvement.  Rounds were done with the Respiratory Therapy Director and Staff therapists and discussed with nursing staff also.  Yevonne Pax, MD [facility] Pulmonary Critical Care Medicine Sleep Medicine

## 2022-12-01 NOTE — Progress Notes (Cosign Needed)
Pulmonary Critical Care Medicine [facility] [place]   PULMONARY CRITICAL CARE SERVICE  PROGRESS NOTE     Marice Guidone Ellis-Hill  EQA:[id number]  DOB: 03/20/1951   DOA: 09/27/2022  Referring Physician: Luna Kitchens, MD  HPI: Amanada Philbrick Ellis-Hill is a 71 y.o. female being followed for ventilator/airway/oxygen weaning Acute on Chronic Respiratory Failure.  Patient seen lying in bed, currently remains on 2 L nasal cannula.  Continues to do well with BiPAP during nocturnal hours.  Medications: Reviewed on Rounds  Physical Exam:  Vitals: Temp 97.3, pulse 67, respirations 19, BP 120/60, SpO2 93%  Ventilator Settings 2 L nasal cannula  General: Comfortable at this time Neck: supple Cardiovascular: no malignant arrhythmias Respiratory: Bilaterally diminished Skin: no rash seen on limited exam Musculoskeletal: No gross abnormality Psychiatric:unable to assess Neurologic:no involuntary movements         Lab Data:   Basic Metabolic Panel: Recent Labs  Lab 11/25/22 0321 11/27/22 0153 11/27/22 2054 12/01/22 0149  NA 135 134*  --  136  K 3.7 3.5  --  3.7  CL 90* 90*  --  92*  CO2 37* 37*  --  37*  GLUCOSE 99 108*  --  110*  BUN 31* 32*  --  27*  CREATININE 1.15* 1.03*  --  0.78  CALCIUM 8.1* 8.4*  --  8.7*  MG  --  1.7 1.8  --     ABG: No results for input(s): "PHART", "PCO2ART", "PO2ART", "HCO3", "O2SAT" in the last 168 hours.  Liver Function Tests: Recent Labs  Lab 12/01/22 0149  AST 18  ALT 21  ALKPHOS 49  BILITOT 0.8  PROT 5.7*  ALBUMIN 2.4*   No results for input(s): "LIPASE", "AMYLASE" in the last 168 hours. No results for input(s): "AMMONIA" in the last 168 hours.  CBC: Recent Labs  Lab 11/25/22 0321 11/27/22 0153 12/01/22 0149  WBC 5.5 6.3 6.2  NEUTROABS  --   --  5.1  HGB 7.7* 7.9* 8.6*  HCT 23.5* 24.2* 26.1*  MCV 98.3 98.8 99.2  PLT 98* 106* 143*    Cardiac Enzymes: No results for input(s): "CKTOTAL", "CKMB", "CKMBINDEX",  "TROPONINI" in the last 168 hours.  BNP (last 3 results) Recent Labs    11/21/22 1211  BNP 322.4*    ProBNP (last 3 results) No results for input(s): "PROBNP" in the last 8760 hours.  Radiological Exams: No results found.  Assessment/Plan Active Problems:   Acute on chronic respiratory failure with hypoxia (HCC)   Multifocal pneumonia   Acute metabolic encephalopathy   COPD, severe (HCC)   Obstructive sleep apnea   Acute on chronic respiratory failure hypoxia-remains on 2 L nasal cannula during the day with the use of BiPAP at night. Multifocal pneumonia on antibiotics we will continue with supportive care Metabolic encephalopathy overall no change Severe COPD we will continue with medical management Obstructive sleep apnea patient is at baseline right now using her BiPAP at night  I have personally seen and evaluated the patient, evaluated laboratory and imaging results, formulated the assessment and plan and placed orders. The Patient requires high complexity decision making with multiple systems involvement.  Rounds were done with the Respiratory Therapy Director and Staff therapists and discussed with nursing staff also.  Yevonne Pax, MD [facility] Pulmonary Critical Care Medicine Sleep Medicine

## 2022-12-02 ENCOUNTER — Encounter (HOSPITAL_COMMUNITY): Payer: Self-pay
